# Patient Record
Sex: Female | Born: 1960
Health system: Southern US, Community
[De-identification: ages and names within clinical notes are randomized; demographics above are authoritative.]

## PROBLEM LIST (undated history)

## (undated) DIAGNOSIS — I4891 Unspecified atrial fibrillation: Secondary | ICD-10-CM

## (undated) DIAGNOSIS — I10 Essential (primary) hypertension: Secondary | ICD-10-CM

## (undated) DIAGNOSIS — I509 Heart failure, unspecified: Secondary | ICD-10-CM

## (undated) DIAGNOSIS — E119 Type 2 diabetes mellitus without complications: Secondary | ICD-10-CM

## (undated) DIAGNOSIS — K224 Dyskinesia of esophagus: Secondary | ICD-10-CM

## (undated) DIAGNOSIS — M1990 Unspecified osteoarthritis, unspecified site: Secondary | ICD-10-CM

## (undated) DIAGNOSIS — C4491 Basal cell carcinoma of skin, unspecified: Secondary | ICD-10-CM

## (undated) DIAGNOSIS — E785 Hyperlipidemia, unspecified: Secondary | ICD-10-CM

## (undated) DIAGNOSIS — K648 Other hemorrhoids: Secondary | ICD-10-CM

## (undated) DIAGNOSIS — F419 Anxiety disorder, unspecified: Secondary | ICD-10-CM

## (undated) DIAGNOSIS — F32A Depression, unspecified: Secondary | ICD-10-CM

## (undated) DIAGNOSIS — K635 Polyp of colon: Secondary | ICD-10-CM

## (undated) DIAGNOSIS — F329 Major depressive disorder, single episode, unspecified: Secondary | ICD-10-CM

## (undated) DIAGNOSIS — I499 Cardiac arrhythmia, unspecified: Secondary | ICD-10-CM

## (undated) DIAGNOSIS — C801 Malignant (primary) neoplasm, unspecified: Secondary | ICD-10-CM

## (undated) HISTORY — DX: Polyp of colon: K63.5

## (undated) HISTORY — DX: Heart failure, unspecified: I50.9

## (undated) HISTORY — PX: CHOLECYSTECTOMY: SHX55

## (undated) HISTORY — DX: Other hemorrhoids: K64.8

## (undated) HISTORY — DX: Dyskinesia of esophagus: K22.4

## (undated) HISTORY — DX: Essential (primary) hypertension: I10

## (undated) HISTORY — DX: Basal cell carcinoma of skin, unspecified: C44.91

## (undated) HISTORY — DX: Anxiety disorder, unspecified: F41.9

## (undated) HISTORY — PX: BASAL CELL CARCINOMA EXCISION: SHX1214

## (undated) HISTORY — DX: Unspecified atrial fibrillation: I48.91

## (undated) HISTORY — DX: Unspecified osteoarthritis, unspecified site: M19.90

---

## 1997-04-03 HISTORY — PX: TUBAL LIGATION: SHX77

## 2005-12-18 ENCOUNTER — Ambulatory Visit: Payer: Self-pay | Admitting: Unknown Physician Specialty

## 2007-05-27 ENCOUNTER — Ambulatory Visit: Payer: Self-pay | Admitting: Unknown Physician Specialty

## 2008-08-10 ENCOUNTER — Ambulatory Visit: Payer: Self-pay | Admitting: Unknown Physician Specialty

## 2008-11-13 ENCOUNTER — Ambulatory Visit: Payer: Self-pay | Admitting: Family Medicine

## 2009-08-12 ENCOUNTER — Ambulatory Visit: Payer: Self-pay | Admitting: Unknown Physician Specialty

## 2011-04-18 ENCOUNTER — Ambulatory Visit: Payer: Self-pay | Admitting: Unknown Physician Specialty

## 2012-02-12 ENCOUNTER — Ambulatory Visit: Payer: Self-pay | Admitting: Unknown Physician Specialty

## 2012-02-12 DIAGNOSIS — D124 Benign neoplasm of descending colon: Secondary | ICD-10-CM | POA: Insufficient documentation

## 2012-02-12 HISTORY — PX: COLONOSCOPY: SHX174

## 2012-10-18 ENCOUNTER — Other Ambulatory Visit: Payer: Self-pay | Admitting: Family Medicine

## 2012-10-18 LAB — CBC WITH DIFFERENTIAL/PLATELET
Basophil #: 0.1 10*3/uL (ref 0.0–0.1)
HCT: 40.1 % (ref 35.0–47.0)
Lymphocyte #: 3.7 10*3/uL — ABNORMAL HIGH (ref 1.0–3.6)
Lymphocyte %: 47.5 %
MCH: 30.5 pg (ref 26.0–34.0)
MCHC: 34.9 g/dL (ref 32.0–36.0)
Monocyte #: 0.7 x10 3/mm (ref 0.2–0.9)
Monocyte %: 9.4 %
Neutrophil #: 3.1 10*3/uL (ref 1.4–6.5)
RBC: 4.59 10*6/uL (ref 3.80–5.20)

## 2012-10-18 LAB — TSH: Thyroid Stimulating Horm: 1.79 u[IU]/mL

## 2012-10-18 LAB — COMPREHENSIVE METABOLIC PANEL
Albumin: 3.9 g/dL (ref 3.4–5.0)
Calcium, Total: 9.5 mg/dL (ref 8.5–10.1)
Co2: 27 mmol/L (ref 21–32)
Creatinine: 0.73 mg/dL (ref 0.60–1.30)
EGFR (African American): >60
Osmolality: 275 (ref 275–301)
SGOT(AST): 44 U/L — ABNORMAL HIGH (ref 15–37)
Total Protein: 8.7 g/dL — ABNORMAL HIGH (ref 6.4–8.2)

## 2012-10-18 LAB — LIPID PANEL
Cholesterol: 193 mg/dL (ref 0–200)
Triglycerides: 215 mg/dL — ABNORMAL HIGH (ref 0–200)
VLDL Cholesterol, Calc: 43 mg/dL — ABNORMAL HIGH (ref 5–40)

## 2013-04-01 ENCOUNTER — Ambulatory Visit: Payer: Self-pay | Admitting: Family Medicine

## 2013-04-03 ENCOUNTER — Ambulatory Visit: Payer: Self-pay | Admitting: Family Medicine

## 2013-05-04 ENCOUNTER — Ambulatory Visit: Payer: Self-pay | Admitting: Family Medicine

## 2014-02-17 DIAGNOSIS — K2960 Other gastritis without bleeding: Secondary | ICD-10-CM | POA: Insufficient documentation

## 2014-02-17 DIAGNOSIS — K5909 Other constipation: Secondary | ICD-10-CM | POA: Insufficient documentation

## 2014-03-13 DIAGNOSIS — A6000 Herpesviral infection of urogenital system, unspecified: Secondary | ICD-10-CM | POA: Insufficient documentation

## 2014-06-12 LAB — TSH: TSH: 0.92 (ref 0.41–5.90)

## 2014-06-12 LAB — MICROALBUMIN, URINE: MICROALB UR: 80

## 2014-12-03 LAB — BASIC METABOLIC PANEL
Creatinine: 0.7 (ref 0.5–1.1)
Glucose: 138

## 2014-12-03 LAB — LIPID PANEL
Cholesterol: 189 (ref 0–200)
HDL: 43 (ref 35–70)
LDL CALC: 114
Triglycerides: 162 — AB (ref 40–160)

## 2014-12-03 LAB — HEMOGLOBIN A1C: HEMOGLOBIN A1C: 6.2 — AB (ref 4.0–6.0)

## 2015-04-08 LAB — LIPID PANEL
CHOLESTEROL: 178 (ref 0–200)
HDL: 40 (ref 35–70)
LDL Cholesterol: 114
Triglycerides: 122 (ref 40–160)

## 2015-04-08 LAB — CBC AND DIFFERENTIAL
HEMATOCRIT: 42 (ref 36–46)
HEMOGLOBIN: 14.1 (ref 12.0–16.0)
Platelets: 223 (ref 150–399)
WBC: 7

## 2015-04-30 LAB — BASIC METABOLIC PANEL
BUN: 11 (ref 4–21)
Creatinine: 0.6 (ref 0.5–1.1)
Glucose: 121
POTASSIUM: 4.3 (ref 3.4–5.3)
SODIUM: 139 (ref 137–147)

## 2015-04-30 LAB — POCT INR: INR: 1.1 (ref 0.9–1.1)

## 2015-04-30 LAB — PROTIME-INR: PROTIME: 11 (ref 10.0–13.8)

## 2015-05-12 ENCOUNTER — Ambulatory Visit: Payer: 59 | Admitting: Registered Nurse

## 2015-05-12 ENCOUNTER — Ambulatory Visit
Admission: RE | Admit: 2015-05-12 | Discharge: 2015-05-12 | Disposition: A | Discharge status: Home / Self Care | Payer: 59 | Source: Ambulatory Visit | Attending: Internal Medicine | Admitting: Internal Medicine

## 2015-05-12 ENCOUNTER — Encounter
Admission: RE | Disposition: A | Discharge status: Home / Self Care | Payer: Self-pay | Source: Ambulatory Visit | Attending: Internal Medicine

## 2015-05-12 DIAGNOSIS — Z83511 Family history of glaucoma: Secondary | ICD-10-CM | POA: Insufficient documentation

## 2015-05-12 DIAGNOSIS — Z808 Family history of malignant neoplasm of other organs or systems: Secondary | ICD-10-CM | POA: Diagnosis not present

## 2015-05-12 DIAGNOSIS — Z8261 Family history of arthritis: Secondary | ICD-10-CM | POA: Diagnosis not present

## 2015-05-12 DIAGNOSIS — Z7984 Long term (current) use of oral hypoglycemic drugs: Secondary | ICD-10-CM | POA: Insufficient documentation

## 2015-05-12 DIAGNOSIS — Z833 Family history of diabetes mellitus: Secondary | ICD-10-CM | POA: Diagnosis not present

## 2015-05-12 DIAGNOSIS — I4892 Unspecified atrial flutter: Secondary | ICD-10-CM | POA: Diagnosis present

## 2015-05-12 DIAGNOSIS — Z8042 Family history of malignant neoplasm of prostate: Secondary | ICD-10-CM | POA: Insufficient documentation

## 2015-05-12 DIAGNOSIS — Z818 Family history of other mental and behavioral disorders: Secondary | ICD-10-CM | POA: Insufficient documentation

## 2015-05-12 DIAGNOSIS — I4581 Long QT syndrome: Secondary | ICD-10-CM | POA: Insufficient documentation

## 2015-05-12 DIAGNOSIS — Z85828 Personal history of other malignant neoplasm of skin: Secondary | ICD-10-CM | POA: Diagnosis not present

## 2015-05-12 DIAGNOSIS — Z9049 Acquired absence of other specified parts of digestive tract: Secondary | ICD-10-CM | POA: Diagnosis not present

## 2015-05-12 DIAGNOSIS — Z820 Family history of epilepsy and other diseases of the nervous system: Secondary | ICD-10-CM | POA: Insufficient documentation

## 2015-05-12 DIAGNOSIS — E119 Type 2 diabetes mellitus without complications: Secondary | ICD-10-CM | POA: Insufficient documentation

## 2015-05-12 DIAGNOSIS — I10 Essential (primary) hypertension: Secondary | ICD-10-CM | POA: Insufficient documentation

## 2015-05-12 DIAGNOSIS — E785 Hyperlipidemia, unspecified: Secondary | ICD-10-CM | POA: Insufficient documentation

## 2015-05-12 DIAGNOSIS — Z79899 Other long term (current) drug therapy: Secondary | ICD-10-CM | POA: Diagnosis not present

## 2015-05-12 DIAGNOSIS — Z8249 Family history of ischemic heart disease and other diseases of the circulatory system: Secondary | ICD-10-CM | POA: Insufficient documentation

## 2015-05-12 DIAGNOSIS — I495 Sick sinus syndrome: Secondary | ICD-10-CM | POA: Insufficient documentation

## 2015-05-12 DIAGNOSIS — F329 Major depressive disorder, single episode, unspecified: Secondary | ICD-10-CM | POA: Insufficient documentation

## 2015-05-12 HISTORY — DX: Major depressive disorder, single episode, unspecified: F32.9

## 2015-05-12 HISTORY — DX: Depression, unspecified: F32.A

## 2015-05-12 HISTORY — PX: ELECTROPHYSIOLOGIC STUDY: SHX172A

## 2015-05-12 HISTORY — DX: Type 2 diabetes mellitus without complications: E11.9

## 2015-05-12 HISTORY — DX: Cardiac arrhythmia, unspecified: I49.9

## 2015-05-12 SURGERY — CARDIOVERSION (CATH LAB)
Anesthesia: General

## 2015-05-12 MED ORDER — PROPOFOL 10 MG/ML IV BOLUS
INTRAVENOUS | Status: DC | PRN
  Administered 2015-05-12: 80 mg via INTRAVENOUS

## 2015-05-12 MED ORDER — SODIUM CHLORIDE 0.9 % IV SOLN
INTRAVENOUS | Status: DC | PRN
  Administered 2015-05-12: 08:00:00 via INTRAVENOUS

## 2015-05-12 NOTE — Transfer of Care (Signed)
Immediate Anesthesia Transfer of Care Note  Patient: Julia Stark  Procedure(s) Performed: Procedure(s): Cardioversion (N/A)  Patient Location: PACU  Anesthesia Type:General  Level of Consciousness: sedated  Airway & Oxygen Therapy: Patient Spontanous Breathing and Patient connected to face mask oxygen  Post-op Assessment: Report given to RN and Post -op Vital signs reviewed and stable  Post vital signs: Reviewed and stable  Last Vitals:  Filed Vitals:   05/12/15 0804 05/12/15 0805  BP:  104/86  Pulse: 74   Temp:    Resp: 14 20    Complications: No apparent anesthesia complications

## 2015-05-12 NOTE — Discharge Instructions (Signed)
Electrical Cardioversion, Care After °Refer to this sheet in the next few weeks. These instructions provide you with information on caring for yourself after your procedure. Your health care provider may also give you more specific instructions. Your treatment has been planned according to current medical practices, but problems sometimes occur. Call your health care provider if you have any problems or questions after your procedure. °WHAT TO EXPECT AFTER THE PROCEDURE °After your procedure, it is typical to have the following sensations: °· Some redness on the skin where the shocks were delivered. If this is tender, a sunburn lotion or hydrocortisone cream may help. °· Possible return of an abnormal heart rhythm within hours or days after the procedure. °HOME CARE INSTRUCTIONS °· Take medicines only as directed by your health care provider. Be sure you understand how and when to take your medicine. °· Learn how to feel your pulse and check it often. °· Limit your activity for 48 hours after the procedure or as directed by your health care provider. °· Avoid or minimize caffeine and other stimulants as directed by your health care provider. °SEEK MEDICAL CARE IF: °· You feel like your heart is beating too fast or your pulse is not regular. °· You have any questions about your medicines. °· You have bleeding that will not stop. °SEEK IMMEDIATE MEDICAL CARE IF: °· You are dizzy or feel faint. °· It is hard to breathe or you feel short of breath. °· There is a change in discomfort in your chest. °· Your speech is slurred or you have trouble moving an arm or leg on one side of your body. °· You get a serious muscle cramp that does not go away. °· Your fingers or toes turn cold or blue. °  °This information is not intended to replace advice given to you by your health care provider. Make sure you discuss any questions you have with your health care provider. °  °Document Released: 01/08/2013 Document Revised: 04/10/2014  Document Reviewed: 01/08/2013 °Elsevier Interactive Patient Education ©2016 [employer]vier Inc[employer] ° °

## 2015-05-12 NOTE — Anesthesia Preprocedure Evaluation (Signed)
Anesthesia Evaluation  Patient identified by MRN, date of birth, ID band Patient awake    Reviewed: Allergy & Precautions, H&P , NPO status , Patient's Chart, lab work & pertinent test results, reviewed documented beta blocker date and time   History of Anesthesia Complications Negative for: history of anesthetic complications  Airway Mallampati: I  TM Distance: >3 FB Neck ROM: full    Dental no notable dental hx. (+) Teeth Intact   Pulmonary neg pulmonary ROS,    Pulmonary exam normal breath sounds clear to auscultation       Cardiovascular Exercise Tolerance: Good (-) hypertension(-) angina(-) CAD, (-) Past MI, (-) Cardiac Stents and (-) CABG Normal cardiovascular exam+ dysrhythmias Atrial Fibrillation (-) Valvular Problems/Murmurs Rhythm:regular Rate:Normal     Neuro/Psych PSYCHIATRIC DISORDERS (Depression) negative neurological ROS     GI/Hepatic negative GI ROS, Neg liver ROS,   Endo/Other  negative endocrine ROSdiabetes  Renal/GU negative Renal ROS  negative genitourinary   Musculoskeletal   Abdominal   Peds  Hematology negative hematology ROS (+)   Anesthesia Other Findings Past Medical History:   Dysrhythmia                                                  Diabetes mellitus without complication (HCC)                 Depression                                                   Reproductive/Obstetrics negative OB ROS                             Anesthesia Physical Anesthesia Plan  ASA: III  Anesthesia Plan: General   Post-op Pain Management:    Induction:   Airway Management Planned:   Additional Equipment:   Intra-op Plan:   Post-operative Plan:   Informed Consent: I have reviewed the patients History and Physical, chart, labs and discussed the procedure including the risks, benefits and alternatives for the proposed anesthesia with the patient or authorized  representative who has indicated his/her understanding and acceptance.   Dental Advisory Given  Plan Discussed with: Anesthesiologist, CRNA and Surgeon  Anesthesia Plan Comments:         Anesthesia Quick Evaluation

## 2015-05-12 NOTE — CV Procedure (Signed)
Electrical Cardioversion Procedure Note JANIYHA HOLUB [id number] Jul 02, 1960  Procedure: Electrical Cardioversion Indications:  Atrial Flutter  Procedure Details Consent: Risks of procedure as well as the alternatives and risks of each were explained to the (patient/caregiver).  Consent for procedure obtained. Time Out: Verified patient identification, verified procedure, site/side was marked, verified correct patient position, special equipment/implants available, medications/allergies/relevent history reviewed, required imaging and test results available.  Performed  Patient placed on cardiac monitor, pulse oximetry, supplemental oxygen as necessary.  Sedation given: Benzodiazepines and Short-acting barbiturates Pacer pads placed anterior and posterior chest.  Cardioverted 1 time(s).  Cardioverted at 120J.  Evaluation Findings: Post procedure EKG shows: NSR Complications: None Patient did tolerate procedure well.   Corey Skains 05/12/2015, 8:01 AM

## 2015-05-12 NOTE — Anesthesia Postprocedure Evaluation (Signed)
Anesthesia Post Note  Patient: Julia Stark  Procedure(s) Performed: Procedure(s) (LRB): Cardioversion (N/A)  Patient location during evaluation: Cath Lab Anesthesia Type: General Level of consciousness: awake and alert Pain management: pain level controlled Vital Signs Assessment: post-procedure vital signs reviewed and stable Respiratory status: spontaneous breathing, nonlabored ventilation, respiratory function stable and patient connected to nasal cannula oxygen Cardiovascular status: blood pressure returned to baseline and stable Postop Assessment: no signs of nausea or vomiting Anesthetic complications: no    Last Vitals:  Filed Vitals:   05/12/15 0822 05/12/15 0837  BP: 105/68 110/61  Pulse: 72 69  Temp:    Resp: 15 14    Last Pain: There were no vitals filed for this visit.               Martha Clan

## 2015-05-25 DIAGNOSIS — I480 Paroxysmal atrial fibrillation: Secondary | ICD-10-CM | POA: Insufficient documentation

## 2015-10-12 ENCOUNTER — Encounter: Payer: Self-pay | Admitting: Family Medicine

## 2015-10-12 LAB — LIPID PANEL
Cholesterol: 207 — AB (ref 0–200)
HDL: 45 (ref 35–70)
LDL Cholesterol: 126
TRIGLYCERIDES: 179 — AB (ref 40–160)

## 2015-10-12 LAB — BASIC METABOLIC PANEL
Creatinine: 0.6 (ref 0.5–1.1)
Glucose: 145

## 2015-10-12 LAB — HEMOGLOBIN A1C: Hgb A1c MFr Bld: 6 (ref 4.0–6.0)

## 2016-01-18 ENCOUNTER — Other Ambulatory Visit: Payer: Self-pay | Admitting: Family Medicine

## 2016-01-18 DIAGNOSIS — Z1231 Encounter for screening mammogram for malignant neoplasm of breast: Secondary | ICD-10-CM

## 2016-01-18 LAB — HEMOGLOBIN A1C: Hemoglobin A1C: 6.4

## 2016-01-18 LAB — HM PAP SMEAR: HM PAP: NEGATIVE

## 2016-02-22 ENCOUNTER — Ambulatory Visit
Admission: RE | Admit: 2016-02-22 | Discharge: 2016-02-22 | Disposition: A | Discharge status: Home / Self Care | Payer: 59 | Source: Ambulatory Visit | Attending: Family Medicine | Admitting: Family Medicine

## 2016-02-22 DIAGNOSIS — Z1231 Encounter for screening mammogram for malignant neoplasm of breast: Secondary | ICD-10-CM | POA: Insufficient documentation

## 2016-03-01 ENCOUNTER — Other Ambulatory Visit: Payer: Self-pay | Admitting: *Deleted

## 2016-03-01 ENCOUNTER — Inpatient Hospital Stay
Admission: RE | Admit: 2016-03-01 | Discharge: 2016-03-01 | Disposition: A | Discharge status: Home / Self Care | Payer: Self-pay | Source: Ambulatory Visit | Attending: *Deleted | Admitting: *Deleted

## 2016-03-01 DIAGNOSIS — Z9289 Personal history of other medical treatment: Secondary | ICD-10-CM

## 2016-10-17 LAB — HM DIABETES EYE EXAM

## 2016-11-02 LAB — LIPID PANEL
Cholesterol: 224 — AB (ref 0–200)
HDL: 51 (ref 35–70)
LDL Cholesterol: 126
Triglycerides: 235 — AB (ref 40–160)

## 2016-11-02 LAB — HEMOGLOBIN A1C: Hgb A1c MFr Bld: 6.2 — AB (ref 4.0–6.0)

## 2016-11-02 LAB — BASIC METABOLIC PANEL
Creatinine: 0.6 (ref 0.5–1.1)
GLUCOSE: 144

## 2017-02-05 DIAGNOSIS — I441 Atrioventricular block, second degree: Secondary | ICD-10-CM | POA: Insufficient documentation

## 2017-02-13 ENCOUNTER — Ambulatory Visit
Admission: RE | Admit: 2017-02-13 | Discharge: 2017-02-13 | Disposition: A | Discharge status: Home / Self Care | Payer: Worker's Compensation | Source: Ambulatory Visit | Attending: Family | Admitting: Family

## 2017-02-13 ENCOUNTER — Other Ambulatory Visit: Payer: Self-pay | Admitting: Family

## 2017-02-13 DIAGNOSIS — R52 Pain, unspecified: Secondary | ICD-10-CM

## 2017-02-13 DIAGNOSIS — M25512 Pain in left shoulder: Secondary | ICD-10-CM | POA: Insufficient documentation

## 2017-04-06 ENCOUNTER — Encounter: Payer: Self-pay | Admitting: *Deleted

## 2017-04-09 ENCOUNTER — Ambulatory Visit: Payer: BLUE CROSS/BLUE SHIELD | Admitting: Anesthesiology

## 2017-04-09 ENCOUNTER — Ambulatory Visit
Admission: RE | Admit: 2017-04-09 | Discharge: 2017-04-09 | Disposition: A | Discharge status: Home / Self Care | Payer: BLUE CROSS/BLUE SHIELD | Source: Ambulatory Visit | Attending: Unknown Physician Specialty | Admitting: Unknown Physician Specialty

## 2017-04-09 ENCOUNTER — Encounter: Payer: Self-pay | Admitting: *Deleted

## 2017-04-09 ENCOUNTER — Encounter
Admission: RE | Disposition: A | Discharge status: Home / Self Care | Payer: Self-pay | Source: Ambulatory Visit | Attending: Unknown Physician Specialty

## 2017-04-09 DIAGNOSIS — Z85828 Personal history of other malignant neoplasm of skin: Secondary | ICD-10-CM | POA: Diagnosis not present

## 2017-04-09 DIAGNOSIS — E119 Type 2 diabetes mellitus without complications: Secondary | ICD-10-CM | POA: Diagnosis not present

## 2017-04-09 DIAGNOSIS — Z7984 Long term (current) use of oral hypoglycemic drugs: Secondary | ICD-10-CM | POA: Diagnosis not present

## 2017-04-09 DIAGNOSIS — Z1211 Encounter for screening for malignant neoplasm of colon: Secondary | ICD-10-CM | POA: Diagnosis present

## 2017-04-09 DIAGNOSIS — Z86010 Personal history of colonic polyps: Secondary | ICD-10-CM | POA: Diagnosis not present

## 2017-04-09 DIAGNOSIS — K648 Other hemorrhoids: Secondary | ICD-10-CM | POA: Diagnosis not present

## 2017-04-09 DIAGNOSIS — F329 Major depressive disorder, single episode, unspecified: Secondary | ICD-10-CM | POA: Insufficient documentation

## 2017-04-09 DIAGNOSIS — Z7901 Long term (current) use of anticoagulants: Secondary | ICD-10-CM | POA: Insufficient documentation

## 2017-04-09 DIAGNOSIS — Z79899 Other long term (current) drug therapy: Secondary | ICD-10-CM | POA: Diagnosis not present

## 2017-04-09 DIAGNOSIS — I4891 Unspecified atrial fibrillation: Secondary | ICD-10-CM | POA: Insufficient documentation

## 2017-04-09 DIAGNOSIS — K640 First degree hemorrhoids: Secondary | ICD-10-CM | POA: Diagnosis not present

## 2017-04-09 DIAGNOSIS — E785 Hyperlipidemia, unspecified: Secondary | ICD-10-CM | POA: Diagnosis not present

## 2017-04-09 HISTORY — DX: Malignant (primary) neoplasm, unspecified: C80.1

## 2017-04-09 HISTORY — PX: COLONOSCOPY WITH PROPOFOL: SHX5780

## 2017-04-09 HISTORY — DX: Hyperlipidemia, unspecified: E78.5

## 2017-04-09 LAB — GLUCOSE, CAPILLARY: Glucose-Capillary: 123 mg/dL — ABNORMAL HIGH (ref 65–99)

## 2017-04-09 SURGERY — COLONOSCOPY WITH PROPOFOL
Anesthesia: General

## 2017-04-09 MED ORDER — FENTANYL CITRATE (PF) 100 MCG/2ML IJ SOLN
INTRAMUSCULAR | Status: DC | PRN
  Administered 2017-04-09 (×2): 50 ug via INTRAVENOUS

## 2017-04-09 MED ORDER — SODIUM CHLORIDE 0.9 % IV SOLN
INTRAVENOUS | Status: DC
  Administered 2017-04-09: 08:00:00 via INTRAVENOUS

## 2017-04-09 MED ORDER — PROPOFOL 10 MG/ML IV BOLUS
INTRAVENOUS | Status: DC | PRN
  Administered 2017-04-09: 40 mg via INTRAVENOUS

## 2017-04-09 MED ORDER — MIDAZOLAM HCL 2 MG/2ML IJ SOLN
INTRAMUSCULAR | Status: AC
  Filled 2017-04-09: qty 2

## 2017-04-09 MED ORDER — LIDOCAINE HCL (PF) 2 % IJ SOLN
INTRAMUSCULAR | Status: DC | PRN
  Administered 2017-04-09: 80 mg

## 2017-04-09 MED ORDER — SODIUM CHLORIDE 0.9 % IV SOLN: INTRAVENOUS | Status: DC

## 2017-04-09 MED ORDER — MIDAZOLAM HCL 5 MG/5ML IJ SOLN
INTRAMUSCULAR | Status: DC | PRN
  Administered 2017-04-09 (×2): 2 mg via INTRAVENOUS

## 2017-04-09 MED ORDER — PROPOFOL 500 MG/50ML IV EMUL
INTRAVENOUS | Status: AC
  Filled 2017-04-09: qty 50

## 2017-04-09 MED ORDER — FENTANYL CITRATE (PF) 100 MCG/2ML IJ SOLN
INTRAMUSCULAR | Status: AC
  Filled 2017-04-09: qty 2

## 2017-04-09 MED ORDER — ONDANSETRON HCL 4 MG/2ML IJ SOLN
INTRAMUSCULAR | Status: DC | PRN
  Administered 2017-04-09: 4 mg via INTRAVENOUS

## 2017-04-09 MED ORDER — PROPOFOL 500 MG/50ML IV EMUL
INTRAVENOUS | Status: DC | PRN
  Administered 2017-04-09: 50 ug/kg/min via INTRAVENOUS

## 2017-04-09 NOTE — Op Note (Addendum)
[facility] Gastroenterology Patient Name: Nil Bolser Procedure Date: 04/09/2017 8:22 AM MRN: [id number] Account #: [id number] Date of Birth: 08-21-1960 Admit Type: Outpatient Age: 57 Room: [facility] ENDO ROOM 3 Gender: Female Note Status: Finalized Procedure:            Colonoscopy Indications:          High risk colon cancer surveillance: Personal history                        of colonic polyps Providers:            Manya Silvas, MD Referring MD:         Ardyth Man. Santayana (Referring MD) Medicines:            Propofol per Anesthesia Complications:        No immediate complications. Procedure:            Pre-Anesthesia Assessment:                       - After reviewing the risks and benefits, the patient                        was deemed in satisfactory condition to undergo the                        procedure.                       After obtaining informed consent, the colonoscope was                        passed under direct vision. Throughout the procedure,                        the patient's blood pressure, pulse, and oxygen                        saturations were monitored continuously. The                        Colonoscope was introduced through the anus and                        advanced to the the cecum, identified by appendiceal                        orifice and ileocecal valve. The colonoscopy was                        performed without difficulty. The patient tolerated the                        procedure well. The quality of the bowel preparation                        was excellent. Findings:      Internal hemorrhoids were found during endoscopy. The hemorrhoids were       small and Grade I (internal hemorrhoids that do not prolapse).      The exam was otherwise without abnormality. Impression:           - Internal  hemorrhoids.                       - The examination was otherwise normal.                       - No specimens  collected. Recommendation:       - Repeat colonoscopy in 5 years for adenoma                        surveillance. Manya Silvas, MD 04/09/2017 9:00:34 AM This report has been signed electronically. Number of Addenda: 0 Note Initiated On: 04/09/2017 8:22 AM Scope Withdrawal Time: 0 hours 9 minutes 43 seconds  Total Procedure Duration: 0 hours 22 minutes 46 seconds       [facility]

## 2017-04-09 NOTE — H&P (Signed)
Primary Care Physician:  Gearldine Shown, DO Primary Gastroenterologist:  Dr. Vira Agar  Pre-Procedure History & Physical: HPI:  Julia Stark is a 57 y.o. female is here for an colonoscopy.   Past Medical History:  Diagnosis Date  . Cancer ([place])    BASAL CELL CARCINOMA OF FACE  . Depression   . Diabetes mellitus without complication ([place])   . Dysrhythmia    A fib  . Hyperlipidemia     Past Surgical History:  Procedure Laterality Date  . CESAREAN SECTION  1999  . CHOLECYSTECTOMY    . COLONOSCOPY  02/12/2012  . ELECTROPHYSIOLOGIC STUDY N/A 05/12/2015   Procedure: Cardioversion;  Surgeon: Corey Skains, MD;  Location: [facility] ORS;  Service: Cardiovascular;  Laterality: N/A;  . [place]    Prior to Admission medications   Medication Sig Start Date End Date Taking? Authorizing Provider  apixaban (ELIQUIS) 5 MG TABS tablet Take 5 mg by mouth 2 (two) times daily.   Yes [provider]  Biotin 10 MG TABS Take 10 mg by mouth daily.   Yes [provider]  diltiazem (CARDIZEM CD) 120 MG 24 hr capsule Take 120 mg by mouth daily.   Yes [provider]  flecainide (TAMBOCOR) 100 MG tablet Take 100 mg by mouth 2 (two) times daily.   Yes [provider]  magnesium oxide (MAG-OX) 400 MG tablet Take 400 mg by mouth daily.   Yes [provider]  metFORMIN (GLUCOPHAGE) 1000 MG tablet Take 1,000 mg by mouth daily.   Yes [provider]  Multiple Vitamin (MULTIVITAMIN) tablet Take 1 tablet by mouth daily.   Yes [provider]  potassium chloride (K-DUR,KLOR-CON) 10 MEQ tablet Take 10 mEq by mouth daily.   Yes [provider]  pravastatin (PRAVACHOL) 40 MG tablet Take 40 mg by mouth daily.   Yes [provider]  ramipril (ALTACE) 1.25 MG capsule Take 1.25 mg by mouth daily.   Yes [provider]  citalopram (CELEXA) 40 MG tablet Take 40 mg by mouth daily.    [provider]     Allergies as of 02/19/2017  . (Not on File)    History reviewed. No pertinent family history.  Social History   Socioeconomic History  . Marital status: Divorced    Spouse name: Not on file  . Number of children: Not on file  . Years of education: Not on file  . Highest education level: Not on file  Social Needs  . Financial resource strain: Not on file  . Food insecurity - worry: Not on file  . Food insecurity - inability: Not on file  . Transportation needs - medical: Not on file  . Transportation needs - non-medical: Not on file  Occupational History  . Not on file  Tobacco Use  . Smoking status: Never Smoker  . Smokeless tobacco: Never Used  Substance and Sexual Activity  . Alcohol use: Yes    Alcohol/week: 3.6 oz    Types: 3 Glasses of wine, 3 Cans of beer per week  . Drug use: No  . Sexual activity: Yes    Birth control/protection: Post-menopausal  Other Topics Concern  . Not on file  Social History Narrative  . Not on file    Review of Systems: See HPI, otherwise negative ROS  Physical Exam: BP (!) 146/75   Pulse 79   Temp (!) 97.1 F (36.2 C) (Tympanic)   Resp 16   Ht 5'  8" (1.727 m)   Wt 89.8 kg (198 lb)   LMP 04/13/2011   SpO2 100%   BMI 30.11 kg/m  General:   Alert,  pleasant and cooperative in NAD Head:  Normocephalic and atraumatic. Neck:  Supple; no masses or thyromegaly. Lungs:  Clear throughout to auscultation.    Heart:  Regular rate and rhythm. Abdomen:  Soft, nontender and nondistended. Normal bowel sounds, without guarding, and without rebound.   Neurologic:  Alert and  oriented x4;  grossly normal neurologically.  Impression/Plan: [name] is here for an colonoscopy to be performed for [facility] colon polyps.  Risks, benefits, limitations, and alternatives regarding  colonoscopy have been reviewed with the patient.  Questions have been answered.  All parties agreeable.   Gaylyn Cheers, MD  04/09/2017, 8:27 AM

## 2017-04-09 NOTE — Transfer of Care (Signed)
Immediate Anesthesia Transfer of Care Note  Patient: Julia Stark  Procedure(s) Performed: COLONOSCOPY WITH PROPOFOL (N/A )  Patient Location: PACU  Anesthesia Type:General  Level of Consciousness: sedated  Airway & Oxygen Therapy: Patient Spontanous Breathing and Patient connected to nasal cannula oxygen  Post-op Assessment: Report given to RN and Post -op Vital signs reviewed and stable  Post vital signs: Reviewed and stable  Last Vitals:  Vitals:   04/09/17 0736  BP: (!) 146/75  Pulse: 79  Resp: 16  Temp: (!) 36.2 C  SpO2: 100%    Last Pain:  Vitals:   04/09/17 0736  TempSrc: Tympanic         Complications: No apparent anesthesia complications

## 2017-04-09 NOTE — Anesthesia Post-op Follow-up Note (Signed)
Anesthesia QCDR form completed.        

## 2017-04-09 NOTE — Anesthesia Preprocedure Evaluation (Signed)
Anesthesia Evaluation  Patient identified by MRN, date of birth, ID band Patient awake    Reviewed: Allergy & Precautions, H&P , NPO status , Patient's Chart, lab work & pertinent test results, reviewed documented beta blocker date and time   History of Anesthesia Complications Negative for: history of anesthetic complications  Airway Mallampati: I  TM Distance: >3 FB Neck ROM: full    Dental  (+) Teeth Intact, Dental Advidsory Given   Pulmonary neg pulmonary ROS,           Cardiovascular Exercise Tolerance: Good (-) hypertension(-) angina(-) CAD, (-) Past MI, (-) Cardiac Stents and (-) CABG + dysrhythmias Atrial Fibrillation (-) Valvular Problems/Murmurs     Neuro/Psych PSYCHIATRIC DISORDERS (Depression) negative neurological ROS     GI/Hepatic negative GI ROS, Neg liver ROS,   Endo/Other  negative endocrine ROSdiabetes  Renal/GU negative Renal ROS  negative genitourinary   Musculoskeletal   Abdominal   Peds  Hematology negative hematology ROS (+)   Anesthesia Other Findings Past Medical History:   Dysrhythmia                                                  Diabetes mellitus without complication (HCC)                 Depression                                                   Reproductive/Obstetrics negative OB ROS                             Anesthesia Physical  Anesthesia Plan  ASA: III  Anesthesia Plan: General   Post-op Pain Management:    Induction: Intravenous  PONV Risk Score and Plan: 3 and Propofol infusion  Airway Management Planned: Nasal Cannula  Additional Equipment:   Intra-op Plan:   Post-operative Plan:   Informed Consent: I have reviewed the patients History and Physical, chart, labs and discussed the procedure including the risks, benefits and alternatives for the proposed anesthesia with the patient or authorized representative who has indicated  his/her understanding and acceptance.   Dental Advisory Given  Plan Discussed with: Anesthesiologist, CRNA and Surgeon  Anesthesia Plan Comments:         Anesthesia Quick Evaluation

## 2017-04-09 NOTE — Anesthesia Postprocedure Evaluation (Signed)
Anesthesia Post Note  Patient: Julia Stark  Procedure(s) Performed: COLONOSCOPY WITH PROPOFOL (N/A )  Patient location during evaluation: Endoscopy Anesthesia Type: General Level of consciousness: awake and alert Pain management: pain level controlled Vital Signs Assessment: post-procedure vital signs reviewed and stable Respiratory status: spontaneous breathing, nonlabored ventilation, respiratory function stable and patient connected to nasal cannula oxygen Cardiovascular status: blood pressure returned to baseline and stable Postop Assessment: no apparent nausea or vomiting Anesthetic complications: no     Last Vitals:  Vitals:   04/09/17 0922 04/09/17 0932  BP: 112/67 122/67  Pulse: 79 73  Resp: 13 12  Temp:    SpO2: 100% 100%    Last Pain:  Vitals:   04/09/17 0902  TempSrc: Tympanic                 Martha Clan

## 2017-04-10 ENCOUNTER — Encounter: Payer: Self-pay | Admitting: Unknown Physician Specialty

## 2017-07-06 ENCOUNTER — Telehealth: Payer: Self-pay | Admitting: Family Medicine

## 2017-07-06 NOTE — Telephone Encounter (Signed)
ROI faxed on 4.3.19 to [place]

## 2017-08-06 DIAGNOSIS — I480 Paroxysmal atrial fibrillation: Secondary | ICD-10-CM | POA: Diagnosis not present

## 2017-08-06 DIAGNOSIS — E782 Mixed hyperlipidemia: Secondary | ICD-10-CM | POA: Diagnosis not present

## 2017-08-06 DIAGNOSIS — I10 Essential (primary) hypertension: Secondary | ICD-10-CM | POA: Diagnosis not present

## 2017-08-22 ENCOUNTER — Encounter: Payer: Self-pay | Admitting: Family Medicine

## 2017-08-22 ENCOUNTER — Ambulatory Visit: Payer: BLUE CROSS/BLUE SHIELD | Admitting: Family Medicine

## 2017-08-22 VITALS — BP 112/64 | HR 92 | Temp 97.8°F | Resp 16 | Ht 66.75 in | Wt 202.0 lb

## 2017-08-22 DIAGNOSIS — I10 Essential (primary) hypertension: Secondary | ICD-10-CM | POA: Diagnosis not present

## 2017-08-22 DIAGNOSIS — E119 Type 2 diabetes mellitus without complications: Secondary | ICD-10-CM | POA: Diagnosis not present

## 2017-08-22 DIAGNOSIS — A6000 Herpesviral infection of urogenital system, unspecified: Secondary | ICD-10-CM

## 2017-08-22 DIAGNOSIS — K119 Disease of salivary gland, unspecified: Secondary | ICD-10-CM | POA: Diagnosis not present

## 2017-08-22 DIAGNOSIS — F329 Major depressive disorder, single episode, unspecified: Secondary | ICD-10-CM | POA: Insufficient documentation

## 2017-08-22 DIAGNOSIS — I480 Paroxysmal atrial fibrillation: Secondary | ICD-10-CM

## 2017-08-22 DIAGNOSIS — F331 Major depressive disorder, recurrent, moderate: Secondary | ICD-10-CM

## 2017-08-22 DIAGNOSIS — F411 Generalized anxiety disorder: Secondary | ICD-10-CM | POA: Insufficient documentation

## 2017-08-22 DIAGNOSIS — E785 Hyperlipidemia, unspecified: Secondary | ICD-10-CM

## 2017-08-22 DIAGNOSIS — J309 Allergic rhinitis, unspecified: Secondary | ICD-10-CM | POA: Insufficient documentation

## 2017-08-22 DIAGNOSIS — E1169 Type 2 diabetes mellitus with other specified complication: Secondary | ICD-10-CM | POA: Insufficient documentation

## 2017-08-22 DIAGNOSIS — I152 Hypertension secondary to endocrine disorders: Secondary | ICD-10-CM | POA: Insufficient documentation

## 2017-08-22 DIAGNOSIS — E1159 Type 2 diabetes mellitus with other circulatory complications: Secondary | ICD-10-CM | POA: Insufficient documentation

## 2017-08-22 NOTE — Patient Instructions (Signed)
Salivary Stone A salivary stone is a mineral deposit that builds up in the ducts that drain your salivary glands. Most salivary gland stones are made of calcium. When a stone forms, saliva can back up into the gland and cause painful swelling. Your salivary glands are the glands that produce spit (saliva). You have six major salivary glands. Each gland has a duct that carries saliva into your mouth. Saliva keeps your mouth moist and breaks down the food that you eat. It also helps to prevent tooth decay. Two salivary glands are located just in front of your ears (parotid). The ducts for these glands open up inside your cheeks, near your back teeth. You also have two glands under your tongue (sublingual) and two glands under your jaw (submandibular). The ducts for these glands open under your tongue. A stone can form in any salivary gland. The most common place for a salivary stone to develop is in a submandibular salivary gland. What are the causes? Any condition that reduces the flow of saliva may lead to stone formation. It is not known why some people form stones and others do not. What increases the risk? You may be more likely to develop a salivary stone if you:  Are female.  Do not drink enough water.  Smoke.  Have high blood pressure.  Have gout.  Have diabetes.  What are the signs or symptoms? The main sign of a salivary gland stone is sudden swelling of a salivary gland when eating. This usually happens under the jaw on one side. Other signs and symptoms include:  Swelling of the cheek or under the tongue when eating.  Pain in the swollen area.  Trouble chewing or swallowing.  Swelling that goes down after eating.  How is this diagnosed? Your health care provider may diagnose a salivary gland stone based on your signs and symptoms. The health care provider will also do a physical exam. In many cases, a stone can be felt in a duct inside your mouth. You may need to see an ear,  nose, and throat specialist (ENT or otolaryngologist) for diagnosis and treatment. You may also need to have diagnostic tests. These may include imaging studies to check for a stone, such as:  X-rays.  Ultrasound.  CT scan.  MRI.  How is this treated? Home care may be enough to treat a small stone that is not causing symptoms. Treatment of a stone that is large enough to cause symptoms may include:  Probing and widening the duct to allow the stone to pass.  Inserting a thin, flexible scope (endoscope) into the duct to locate and remove the stone.  Breaking up the stone with sound waves.  Removing the entire salivary gland.  Follow these instructions at home:  Drink enough fluid to keep your urine clear or pale yellow.  Follow these instructions every few hours: ? Suck on a lemon candy to stimulate the flow of saliva. ? Put a hot compress over the gland. ? Gently massage the gland.  Do not use any tobacco products, including cigarettes, chewing tobacco, or electronic cigarettes. If you need help quitting, ask your health care provider. Contact a health care provider if:  You have pain and swelling in your face, jaw, or mouth after eating.  You have persistent swelling in any of these places: ? In front of your ear. ? Under your jaw. ? Inside your mouth. Get help right away if:  You have pain and swelling in your face,   jaw, or mouth that are getting worse.  Your pain and swelling make it hard to swallow or breathe. This information is not intended to replace advice given to you by your health care provider. Make sure you discuss any questions you have with your health care provider. Document Released: 04/27/2004 Document Revised: 08/26/2015 Document Reviewed: 08/20/2013 Elsevier Interactive Patient Education  2018 [employer]vier Inc.  

## 2017-08-22 NOTE — Assessment & Plan Note (Signed)
Continue Valtrex as needed for outbreaks. 

## 2017-08-22 NOTE — Progress Notes (Signed)
Patient: Julia Stark, Female    DOB: 1960-06-25, 57 y.o.   MRN: [id number] Visit Date: 08/22/2017  Today's Provider: Lavon Paganini, MD   I, Martha Clan, CMA, am acting as scribe for Lavon Paganini, MD.  Chief Complaint  Patient presents with  . Establish Care   Subjective:    Reestablish Care [name] is a 57 y.o. female who presents today to reestablish care. She was previously Dr. Alben Spittle patient, and then she changes PCP's to [facility]. She feels fairly well. [name] c/o fatigue, arthralgias and myalgias. She reports exercising daily. Walks dog. She reports she is sleeping fairly well.  Last mammogram- 03/03/2016- BI-RADS 1 Last pap- 01/18/2016- NIL; HPV negative Last colonoscopy- 04/09/2017- Dr. Vira Agar. Internal hemorrhoids. Otherwise WNL. Repeat 5 years for adenoma surveillance.  H/o BCC of the face: Mother has h/o melanoma.  She states she is followed by dermatology annually for skin exams.  She wears sunscreen regularly.  HTN: - Medications: ramipril 10mg  daily - Compliance: good - Checking BP at home: no - Denies any SOB, CP, vision changes, LE edema, medication SEs, or symptoms of hypotension  T2DM - Checking BG at home: no - Medications: metformin 1000mg  daily - Compliance: good - eye exam: Reports that she is up-to-date, will request records from ophthalmology - foot exam: Needs - microalbumin: N/A, on ramipril - denies symptoms of hypoglycemia, polyuria, polydipsia, numbness extremities, foot ulcers/trauma  HLD - states Dr. Nehemiah Massed stopped statin (Crestor) last week due to myalgias - she is following up with Cardiology in 6 months  Depression/Anxiety: Was previously taking celexa and stopped due to exacerbation of Afib. Has been taking Effexor 37.5mg  daily for last few months.  Feels fairly well controlled.  Also has Xanax 0.25mg  to take daily prn (only takes 0.125 mg once every 2 months about).   Afib: Previously  cardioverted and doing well.  Last year went back into Afib.  Flecanide was added to current diltiazem.  Also taking Eliquis.  [name] followed by cardiology.  Jaw pain: This has been present intermittently for the last 2 to 3 weeks.  It will occur at rest suddenly and resolve over a few minutes.  It does not radiate from anywhere.  She feels a fullness of her jawline and salivary glands when this is happening.  [name] able to suck on lemon drop candy and this makes it better.  Her son has had lots of salivary stones and infections in the past and she remembers the symptoms from then.  She will occasionally feel this on the right side, but it is mostly occurring on the left side.  She states this is similar to when she had mumps as a child.  Patient also has history of genital herpes with intermittent outbreaks.  She takes Valtrex for these outbreaks when they occur. -----------------------------------------------------------------   Review of Systems  Constitutional: Positive for fatigue. Negative for activity change, appetite change, chills, diaphoresis, fever and unexpected weight change.  HENT: Positive for ear pain and sneezing. Negative for congestion, dental problem, drooling, ear discharge, facial swelling, hearing loss, mouth sores, nosebleeds, postnasal drip, rhinorrhea, sinus pressure, sinus pain, sore throat, tinnitus, trouble swallowing and voice change.   Eyes: Positive for pain. Negative for photophobia, discharge, redness, itching and visual disturbance.  Respiratory: Positive for cough. Negative for apnea, choking, chest tightness, shortness of breath, wheezing and stridor.   Cardiovascular: Positive for palpitations. Negative for chest pain and leg swelling.  Gastrointestinal: Positive for abdominal distention, abdominal pain and constipation. Negative for anal bleeding, blood in stool, diarrhea, nausea, rectal pain and vomiting.  Endocrine: Negative.   Genitourinary: Positive for  enuresis. Negative for decreased urine volume, difficulty urinating, dyspareunia, dysuria, flank pain, frequency, genital sores, hematuria, menstrual problem, pelvic pain, urgency, vaginal bleeding, vaginal discharge and vaginal pain.  Musculoskeletal: Positive for arthralgias and myalgias. Negative for back pain, gait problem, joint swelling, neck pain and neck stiffness.  Skin: Negative.   Allergic/Immunologic: Negative.   Neurological: Negative.   Hematological: Negative.   Psychiatric/Behavioral: Positive for sleep disturbance. Negative for agitation, behavioral problems, confusion, decreased concentration, dysphoric mood, hallucinations, self-injury and suicidal ideas. The patient is not nervous/anxious and is not hyperactive.     Social History      She  reports that she has never smoked. She has never used smokeless tobacco. She reports that she drinks about 2.4 oz of alcohol per week. She reports that she does not use drugs.       Social History   Socioeconomic History  . Marital status: Divorced    Spouse name: Not on file  . Number of children: 3  . Years of education: 63  . Highest education level: High school graduate  Occupational History    Employer: [employer]  Social Needs  . Financial resource strain: Not on file  . Food insecurity:    Worry: Not on file    Inability: Not on file  . Transportation needs:    Medical: Not on file    Non-medical: Not on file  Tobacco Use  . Smoking status: Never Smoker  . Smokeless tobacco: Never Used  Substance and Sexual Activity  . Alcohol use: Yes    Alcohol/week: 2.4 oz    Types: 2 Glasses of wine, 2 Cans of beer per week  . Drug use: No  . Sexual activity: Yes    Birth control/protection: Post-menopausal  Lifestyle  . Physical activity:    Days per week: Not on file    Minutes per session: Not on file  . Stress: Not on file  Relationships  . Social connections:    Talks on phone: Not on file    Gets together: Not on  file    Attends religious service: Not on file    Active member of club or organization: Not on file    Attends meetings of clubs or organizations: Not on file    Relationship status: Not on file  Other Topics Concern  . Not on file  Social History Narrative  . Not on file    Past Medical History:  Diagnosis Date  . Anxiety   . Arthritis   . Atrial fibrillation ([place])   . Cancer ([place])    BASAL CELL CARCINOMA OF FACE  . Depression   . Diabetes mellitus without complication ([place])   . Dysrhythmia    A fib  . Hyperlipidemia   . Hypertension      Patient Active Problem List   Diagnosis Date Noted  . Affective disorder, major 08/22/2017  . Allergic rhinitis 08/22/2017  . Anxiety, generalized 08/22/2017  . Diabetes mellitus type 2, uncomplicated ([place]) 00/17/4944  . Essential hypertension 08/22/2017  . Hyperlipidemia 08/22/2017  . Mobitz type 2 second degree atrioventricular block 02/05/2017  . Paroxysmal A-fib ([place]) 05/25/2015  . Recurrent genital HSV (herpes simplex virus) infection 03/13/2014  . Chronic constipation 02/17/2014  . Reflux gastritis 02/17/2014  . Adenomatous polyp of descending colon 02/12/2012  Past Surgical History:  Procedure Laterality Date  . CESAREAN SECTION  1999  . CHOLECYSTECTOMY    . COLONOSCOPY  02/12/2012  . COLONOSCOPY WITH PROPOFOL N/A 04/09/2017   Procedure: COLONOSCOPY WITH PROPOFOL;  Surgeon: Manya Silvas, MD;  Location: [facility] ENDOSCOPY;  Service: Endoscopy;  Laterality: N/A;  . ELECTROPHYSIOLOGIC STUDY N/A 05/12/2015   Procedure: Cardioversion;  Surgeon: Corey Skains, MD;  Location: [facility] ORS;  Service: Cardiovascular;  Laterality: N/A;  . [place]    Family History        Family Status  Relation Name Status  . Mother  (Not Specified)  . Father  (Not Specified)  . Mat Uncle  (Not Specified)  . MGF  (Not Specified)  . PGM  (Not Specified)  . PGF  (Not Specified)  . Cousin  (Not Specified)  . Sister  Alive  .  Brother  Alive        Her family history includes Alzheimer's disease in her father; Colon cancer in her maternal uncle; Depression in her mother and sister; Diabetes in her brother, mother, paternal grandmother, and sister; Heart disease in her paternal grandmother; Hyperlipidemia in her maternal grandfather; Hypertension in her maternal grandfather and mother; Ovarian cancer in her cousin; Prostate cancer in her paternal grandfather; Skin cancer in her mother.      Allergies  Allergen Reactions  . Rosuvastatin     muscle cramps     Current Outpatient Medications:  .  ALPRAZolam (XANAX) 0.25 MG tablet, Take by mouth., Disp: , Rfl:  .  apixaban (ELIQUIS) 5 MG TABS tablet, Take 5 mg by mouth 2 (two) times daily., Disp: , Rfl:  .  Biotin 5 MG TABS, Take 5 mg by mouth daily. , Disp: , Rfl:  .  diltiazem (CARDIZEM) 90 MG tablet, Take 1 tablet by mouth. Take 2-3 times per day PRN, Disp: , Rfl:  .  flecainide (TAMBOCOR) 100 MG tablet, Take 100 mg by mouth 2 (two) times daily., Disp: , Rfl:  .  magnesium oxide (MAG-OX) 400 MG tablet, Take 400 mg by mouth daily., Disp: , Rfl:  .  metFORMIN (GLUCOPHAGE) 1000 MG tablet, Take 1,000 mg by mouth daily., Disp: , Rfl:  .  Multiple Vitamin (MULTIVITAMIN) tablet, Take 1 tablet by mouth daily., Disp: , Rfl:  .  ramipril (ALTACE) 10 MG capsule, Take 1.25 mg by mouth daily. , Disp: , Rfl:  .  venlafaxine XR (EFFEXOR-XR) 37.5 MG 24 hr capsule, , Disp: , Rfl:  .  vitamin E 400 UNIT capsule, Take 400 Units by mouth. , Disp: , Rfl:    Patient Care Team: Virginia Crews, MD as PCP - General (Family Medicine)      Objective:   Vitals: BP 112/64 (BP Location: Left Arm, Patient Position: Sitting, Cuff Size: Normal)   Pulse 92   Temp 97.8 F (36.6 C) (Oral)   Resp 16   Ht 5' 6.75" (1.695 m)   Wt 202 lb (91.6 kg)   LMP 04/13/2011   SpO2 96%   BMI 31.88 kg/m    Vitals:   08/22/17 1511  BP: 112/64  Pulse: 92  Resp: 16  Temp: 97.8 F (36.6 C)    TempSrc: Oral  SpO2: 96%  Weight: 202 lb (91.6 kg)  Height: 5' 6.75" (1.695 m)     Physical Exam  Constitutional: [name] oriented to person, place, and time. She appears well-developed and well-nourished. No distress.  HENT:  Head: Normocephalic and atraumatic.  Right Ear: External ear normal.  Left Ear: External ear normal.  Nose: Nose normal.  Mouth/Throat: Oropharynx is clear and moist.  Some fullness and TTP of submandibular salivary gland on L side  Eyes: Pupils are equal, round, and reactive to light. Conjunctivae and EOM are normal. No scleral icterus.  Neck: Neck supple. No thyromegaly present.  Cardiovascular: Normal rate, regular rhythm, normal heart sounds and intact distal pulses.  No murmur heard. Pulmonary/Chest: Effort normal and breath sounds normal. No respiratory distress. She has no wheezes. She has no rales.  Abdominal: Soft. Bowel sounds are normal. She exhibits no distension. There is no tenderness. There is no rebound and no guarding.  Musculoskeletal: She exhibits no edema or deformity.  Lymphadenopathy:    She has no cervical adenopathy.  Neurological: [name] alert and oriented to person, place, and time.  Skin: Skin is warm and dry. Capillary refill takes less than 2 seconds. No rash noted.  Psychiatric: She has a normal mood and affect. Her behavior is normal.  Vitals reviewed.    Depression Screen PHQ 2/9 Scores 08/22/2017  PHQ - 2 Score 0  PHQ- 9 Score 3     Assessment & Plan:   Problem List Items Addressed This Visit      Cardiovascular and Mediastinum   Essential hypertension - Primary    Well-controlled Continue current medications Check BMP      Relevant Medications   diltiazem (CARDIZEM) 90 MG tablet   Other Relevant Orders   Basic Metabolic Panel (BMET)   Paroxysmal A-fib ([place])    Followed by cardiology Doing well on current medications Rate controlled and rhythm controlled today Continue Eliquis, flecainide and diltiazem       Relevant Medications   diltiazem (CARDIZEM) 90 MG tablet   Other Relevant Orders   CBC     Endocrine   Diabetes mellitus type 2, uncomplicated (HCC)    Previously well controlled Continue ramipril for previous elevated urine microalbumin Continue metformin daily Recheck A1c If A1c is elevated, consider increasing metformin to twice daily dosing Foot exam at next visit Request records from last eye exam Will update vaccination records      Relevant Orders   Hemoglobin A1c     Genitourinary   Recurrent genital HSV (herpes simplex virus) infection    Continue Valtrex as needed for outbreaks      Relevant Medications   valACYclovir (VALTREX) 1000 MG tablet     Other   Affective disorder, major    Patient is doing well with stable mood Continue Effexor at current dose Could consider higher dose in the future if needs any titration      Relevant Medications   ALPRAZolam (XANAX) 0.25 MG tablet   venlafaxine XR (EFFEXOR-XR) 37.5 MG 24 hr capsule   Hyperlipidemia    Patient is followed by cardiology Cardiology stopped Crestor a few weeks ago when patient was having significant myalgias.  Their plan is to recheck her lipid panel in about 6 months Pending her lipid panel, she may be a good candidate for a PCSK9 inhibitor      Relevant Medications   diltiazem (CARDIZEM) 90 MG tablet    Other Visit Diagnoses    Salivary gland disorder       Relevant Orders   Ambulatory referral to ENT       Return in about 3 months (around 11/22/2017) for CPE.   The entirety of the information documented in the History of Present Illness, Review of Systems  and Physical Exam were personally obtained by me. Portions of this information were initially documented by Raquel Sarna Ratchford, CMA and reviewed by me for thoroughness and accuracy.    Virginia Crews, MD, MPH [facility] 08/22/2017 4:50 PM

## 2017-08-22 NOTE — Assessment & Plan Note (Signed)
Followed by cardiology Doing well on current medications Rate controlled and rhythm controlled today Continue Eliquis, flecainide and diltiazem

## 2017-08-22 NOTE — Assessment & Plan Note (Signed)
Well-controlled Continue current medications Check BMP

## 2017-08-22 NOTE — Assessment & Plan Note (Signed)
Patient is followed by cardiology Cardiology stopped Crestor a few weeks ago when patient was having significant myalgias.  Their plan is to recheck her lipid panel in about 6 months Pending her lipid panel, she may be a good candidate for a PCSK9 inhibitor

## 2017-08-22 NOTE — Assessment & Plan Note (Signed)
Patient is doing well with stable mood Continue Effexor at current dose Could consider higher dose in the future if needs any titration

## 2017-08-22 NOTE — Assessment & Plan Note (Signed)
Previously well controlled Continue ramipril for previous elevated urine microalbumin Continue metformin daily Recheck A1c If A1c is elevated, consider increasing metformin to twice daily dosing Foot exam at next visit Request records from last eye exam Will update vaccination records

## 2017-08-23 ENCOUNTER — Telehealth: Payer: Self-pay

## 2017-08-23 LAB — BASIC METABOLIC PANEL
BUN / CREAT RATIO: 18 (ref 9–23)
BUN: 10 mg/dL (ref 6–24)
CALCIUM: 9.6 mg/dL (ref 8.7–10.2)
CHLORIDE: 101 mmol/L (ref 96–106)
CO2: 24 mmol/L (ref 20–29)
CREATININE: 0.57 mg/dL (ref 0.57–1.00)
GFR, EST AFRICAN AMERICAN: 120 mL/min/{1.73_m2} (ref >59)
GFR, EST NON AFRICAN AMERICAN: 104 mL/min/{1.73_m2} (ref >59)
Glucose: 127 mg/dL — ABNORMAL HIGH (ref 65–99)
Potassium: 4 mmol/L (ref 3.5–5.2)
Sodium: 140 mmol/L (ref 134–144)

## 2017-08-23 LAB — CBC
HEMATOCRIT: 41.4 % (ref 34.0–46.6)
HEMOGLOBIN: 14.5 g/dL (ref 11.1–15.9)
MCH: 30.3 pg (ref 26.6–33.0)
MCHC: 35 g/dL (ref 31.5–35.7)
MCV: 87 fL (ref 79–97)
Platelets: 251 10*3/uL (ref 150–450)
RBC: 4.78 x10E6/uL (ref 3.77–5.28)
RDW: 12.9 % (ref 12.3–15.4)
WBC: 6.6 10*3/uL (ref 3.4–10.8)

## 2017-08-23 LAB — HEMOGLOBIN A1C
ESTIMATED AVERAGE GLUCOSE: 134 mg/dL
Hgb A1c MFr Bld: 6.3 % — ABNORMAL HIGH (ref 4.8–5.6)

## 2017-08-23 NOTE — Telephone Encounter (Signed)
-----   Message from Virginia Crews, MD sent at 08/23/2017  9:12 AM EDT ----- A1c is well controlled at 6.3.  Normal kidney function and electrolytes.  Normal blood counts.  Virginia Crews, MD, MPH [facility] 08/23/2017 9:12 AM

## 2017-08-23 NOTE — Telephone Encounter (Signed)
Pt advised.

## 2017-09-05 ENCOUNTER — Encounter: Payer: Self-pay | Admitting: Physician Assistant

## 2017-09-05 ENCOUNTER — Ambulatory Visit: Payer: BLUE CROSS/BLUE SHIELD | Admitting: Physician Assistant

## 2017-09-05 VITALS — BP 120/78 | HR 69 | Temp 97.8°F | Resp 16 | Wt 204.2 lb

## 2017-09-05 DIAGNOSIS — J069 Acute upper respiratory infection, unspecified: Secondary | ICD-10-CM | POA: Diagnosis not present

## 2017-09-05 NOTE — Patient Instructions (Addendum)
Nasal irrigations Mucinex DM Flonase [place] (vertigo)  How to Perform the Epley Maneuver The Epley maneuver is an exercise that relieves symptoms of vertigo. Vertigo is the feeling that you or your surroundings are moving when they are not. When you feel vertigo, you may feel like the room is spinning and have trouble walking. Dizziness is a little different than vertigo. When you are dizzy, you may feel unsteady or light-headed. You can do this maneuver at home whenever you have symptoms of vertigo. You can do it up to 3 times a day until your symptoms go away. Even though the Epley maneuver may relieve your vertigo for a few weeks, it is possible that your symptoms will return. This maneuver relieves vertigo, but it does not relieve dizziness. What are the risks? If it is done correctly, the Epley maneuver is considered safe. Sometimes it can lead to dizziness or nausea that goes away after a short time. If you develop other symptoms, such as changes in vision, weakness, or numbness, stop doing the maneuver and call your health care provider. How to perform the Epley maneuver 1. Sit on the edge of a bed or table with your back straight and your legs extended or hanging over the edge of the bed or table. 2. Turn your head halfway toward the affected ear or side. 3. Lie backward quickly with your head turned until you are lying flat on your back. You may want to position a pillow under your shoulders. 4. Hold this position for 30 seconds. You may experience an attack of vertigo. This is normal. 5. Turn your head to the opposite direction until your unaffected ear is facing the floor. 6. Hold this position for 30 seconds. You may experience an attack of vertigo. This is normal. Hold this position until the vertigo stops. 7. Turn your whole body to the same side as your head. Hold for another 30 seconds. 8. Sit back up. You can repeat this exercise up to 3 times a day. Follow these instructions at  home:  After doing the Epley maneuver, you can return to your normal activities.  Ask your health care provider if there is anything you should do at home to prevent vertigo. He or she may recommend that you: ? Keep your head raised (elevated) with two or more pillows while you sleep. ? Do not sleep on the side of your affected ear. ? Get up slowly from bed. ? Avoid sudden movements during the day. ? Avoid extreme head movement, like looking up or bending over. Contact a health care provider if:  Your vertigo gets worse.  You have other symptoms, including: ? Nausea. ? Vomiting. ? Headache. Get help right away if:  You have vision changes.  You have a severe or worsening headache or neck pain.  You cannot stop vomiting.  You have new numbness or weakness in any part of your body. Summary  Vertigo is the feeling that you or your surroundings are moving when they are not.  The Epley maneuver is an exercise that relieves symptoms of vertigo.  If the Epley maneuver is done correctly, it is considered safe. You can do it up to 3 times a day. This information is not intended to replace advice given to you by your health care provider. Make sure you discuss any questions you have with your health care provider. Document Released: 03/25/2013 Document Revised: 02/08/2016 Document Reviewed: 02/08/2016 Elsevier Interactive Patient Education  2017 [employer].

## 2017-09-05 NOTE — Progress Notes (Signed)
Patient: Julia MICHALOWSKI Female    DOB: 1960-08-15   57 y.o.   MRN: [id number] Visit Date: 09/05/2017  Today's Provider: Mar Daring, PA-C   Chief Complaint  Patient presents with  . URI   Subjective:    URI   This is a new problem. The current episode started yesterday. There has been no fever. Associated symptoms include congestion, coughing, ear pain (Left ear pain specially when she bends over-she is scheduled to go see ENT), headaches (slight headache) and rhinorrhea. Pertinent negatives include no chest pain, sneezing, sore throat or wheezing.      Allergies  Allergen Reactions  . Rosuvastatin     muscle cramps     Current Outpatient Medications:  .  ALPRAZolam (XANAX) 0.25 MG tablet, Take 0.25 mg by mouth as needed. , Disp: , Rfl:  .  apixaban (ELIQUIS) 5 MG TABS tablet, Take 5 mg by mouth 2 (two) times daily., Disp: , Rfl:  .  Biotin 5 MG TABS, Take 5 mg by mouth daily. , Disp: , Rfl:  .  diltiazem (CARDIZEM) 90 MG tablet, Take 1 tablet by mouth. Take 2-3 times per day PRN, Disp: , Rfl:  .  flecainide (TAMBOCOR) 100 MG tablet, Take 100 mg by mouth 2 (two) times daily., Disp: , Rfl:  .  magnesium oxide (MAG-OX) 400 MG tablet, Take 400 mg by mouth daily., Disp: , Rfl:  .  metFORMIN (GLUCOPHAGE) 1000 MG tablet, Take 1,000 mg by mouth daily., Disp: , Rfl:  .  Multiple Vitamin (MULTIVITAMIN) tablet, Take 1 tablet by mouth daily., Disp: , Rfl:  .  ramipril (ALTACE) 10 MG capsule, Take 1.25 mg by mouth daily. , Disp: , Rfl:  .  valACYclovir (VALTREX) 1000 MG tablet, Take 1,000 mg by mouth daily. Take daily for 5 days as needed for outbreak, Disp: , Rfl:  .  venlafaxine XR (EFFEXOR-XR) 37.5 MG 24 hr capsule, , Disp: , Rfl:  .  vitamin E 400 UNIT capsule, Take 400 Units by mouth. , Disp: , Rfl:   Review of Systems  Constitutional: Positive for fatigue. Negative for fever.  HENT: Positive for congestion, ear pain (Left ear pain specially when she bends over-she is  scheduled to go see ENT), postnasal drip, rhinorrhea and sinus pressure. Negative for sneezing, sore throat and trouble swallowing.   Respiratory: Positive for cough. Negative for chest tightness, shortness of breath and wheezing.   Cardiovascular: Negative for chest pain, palpitations and leg swelling.  Neurological: Positive for dizziness and headaches (slight headache).  Psychiatric/Behavioral: Positive for sleep disturbance.    Social History   Tobacco Use  . Smoking status: Never Smoker  . Smokeless tobacco: Never Used  Substance Use Topics  . Alcohol use: Yes    Alcohol/week: 2.4 oz    Types: 2 Glasses of wine, 2 Cans of beer per week   Objective:   BP 120/78 (BP Location: Left Arm, Patient Position: Sitting, Cuff Size: Normal)   Pulse 69   Temp 97.8 F (36.6 C) (Oral)   Resp 16   Wt 204 lb 3.2 oz (92.6 kg)   LMP 04/13/2011   PF 98 L/min   BMI 32.22 kg/m  Vitals:   09/05/17 1433  BP: 120/78  Pulse: 69  Resp: 16  Temp: 97.8 F (36.6 C)  TempSrc: Oral  Weight: 204 lb 3.2 oz (92.6 kg)  PF: 98 L/min     Physical Exam  Constitutional: She appears well-developed and  well-nourished. No distress.  HENT:  Head: Normocephalic and atraumatic.  Right Ear: Hearing, tympanic membrane, external ear and ear canal normal.  Left Ear: Hearing, tympanic membrane, external ear and ear canal normal.  Nose: Nose normal.  Mouth/Throat: Uvula is midline, oropharynx is clear and moist and mucous membranes are normal. No oropharyngeal exudate.  Eyes: Pupils are equal, round, and reactive to light. Conjunctivae are normal. Right eye exhibits no discharge. Left eye exhibits no discharge. No scleral icterus.  Neck: Normal range of motion. Neck supple. No tracheal deviation present. No thyromegaly present.  Cardiovascular: Normal rate, regular rhythm and normal heart sounds. Exam reveals no gallop and no friction rub.  No murmur heard. Pulmonary/Chest: Effort normal and breath sounds  normal. No stridor. No respiratory distress. She has no wheezes. She has no rales.  Lymphadenopathy:    She has no cervical adenopathy.  Skin: Skin is warm and dry. [name] not diaphoretic.  Vitals reviewed.       Assessment & Plan:     1. Viral upper respiratory tract infection OTC symptomatic relief prn. Nasal irrigations. Flonase for ETD. Push fluids, rest. Call if symptoms fail to improve in 7-10 days.   Mar Daring, PA-C  [facility]

## 2017-09-18 DIAGNOSIS — K1121 Acute sialoadenitis: Secondary | ICD-10-CM | POA: Diagnosis not present

## 2017-10-31 ENCOUNTER — Encounter: Payer: Self-pay | Admitting: Family Medicine

## 2017-11-01 LAB — LIPID PANEL
CHOLESTEROL: 247 — AB (ref 0–200)
HDL: 47 (ref 35–70)
LDL CALC: 154
TRIGLYCERIDES: 229 — AB (ref 40–160)

## 2017-11-01 LAB — BASIC METABOLIC PANEL
Creatinine: 0.7 (ref 0.5–1.1)
Glucose: 110

## 2017-11-01 LAB — HEMOGLOBIN A1C: Hgb A1c MFr Bld: 6 (ref 4.0–6.0)

## 2017-11-07 DIAGNOSIS — Z85828 Personal history of other malignant neoplasm of skin: Secondary | ICD-10-CM | POA: Diagnosis not present

## 2017-11-07 DIAGNOSIS — L578 Other skin changes due to chronic exposure to nonionizing radiation: Secondary | ICD-10-CM | POA: Diagnosis not present

## 2017-11-07 DIAGNOSIS — L570 Actinic keratosis: Secondary | ICD-10-CM | POA: Diagnosis not present

## 2017-11-22 ENCOUNTER — Ambulatory Visit (INDEPENDENT_AMBULATORY_CARE_PROVIDER_SITE_OTHER): Payer: BLUE CROSS/BLUE SHIELD | Admitting: Family Medicine

## 2017-11-22 ENCOUNTER — Encounter: Payer: Self-pay | Admitting: Family Medicine

## 2017-11-22 VITALS — BP 112/68 | HR 68 | Temp 98.0°F | Resp 16 | Ht 67.0 in | Wt 194.0 lb

## 2017-11-22 DIAGNOSIS — Z1231 Encounter for screening mammogram for malignant neoplasm of breast: Secondary | ICD-10-CM | POA: Diagnosis not present

## 2017-11-22 DIAGNOSIS — E669 Obesity, unspecified: Secondary | ICD-10-CM

## 2017-11-22 DIAGNOSIS — Z1159 Encounter for screening for other viral diseases: Secondary | ICD-10-CM

## 2017-11-22 DIAGNOSIS — Z6830 Body mass index (BMI) 30.0-30.9, adult: Secondary | ICD-10-CM

## 2017-11-22 DIAGNOSIS — Z114 Encounter for screening for human immunodeficiency virus [HIV]: Secondary | ICD-10-CM

## 2017-11-22 DIAGNOSIS — Z0000 Encounter for general adult medical examination without abnormal findings: Secondary | ICD-10-CM | POA: Diagnosis not present

## 2017-11-22 DIAGNOSIS — Z1239 Encounter for other screening for malignant neoplasm of breast: Secondary | ICD-10-CM

## 2017-11-22 NOTE — Progress Notes (Signed)
Patient: Julia Stark, Female    DOB: 09/02/1960, 57 y.o.   MRN: [id number] Visit Date: 11/22/2017  Today's Provider: Lavon Paganini, MD   I, Martha Clan, CMA, am acting as scribe for Lavon Paganini, MD.  Chief Complaint  Patient presents with  . Annual Exam   Subjective:    Annual physical exam Julia Stark is a 57 y.o. female who presents today for health maintenance and complete physical. She feels well. She reports exercising daily. Walks dog about 3 times per day. She reports she is sleeping poorly. Wakes up around 2:00 am, and has difficulty falling back asleep.  Last mammogram- 03/01/2016- BI-RADS 1 Last pap- 01/18/2016- NIL; HPV Neg Last colonoscopy- 04/09/2017- internal hemorrhoids. Otherwise WNL. Repeat 5 years due to personal H/O adenomatous polyps.  -----------------------------------------------------------------   Review of Systems  Constitutional: Positive for diaphoresis and fatigue. Negative for activity change, appetite change, chills, fever and unexpected weight change.  HENT: Positive for ear pain. Negative for congestion, dental problem, drooling, ear discharge, facial swelling, hearing loss, mouth sores, nosebleeds, postnasal drip, rhinorrhea, sinus pressure, sinus pain, sneezing, sore throat, tinnitus, trouble swallowing and voice change.   Eyes: Positive for pain and itching. Negative for photophobia, discharge, redness and visual disturbance.  Respiratory: Negative.   Cardiovascular: Positive for palpitations (afib). Negative for chest pain and leg swelling.  Gastrointestinal: Negative.   Endocrine: Negative.   Genitourinary: Positive for enuresis. Negative for decreased urine volume, difficulty urinating, dyspareunia, dysuria, flank pain, frequency, genital sores, hematuria, menstrual problem, pelvic pain, urgency, vaginal bleeding, vaginal discharge and vaginal pain.  Musculoskeletal: Positive for arthralgias and myalgias. Negative  for back pain, gait problem, joint swelling, neck pain and neck stiffness.  Skin: Negative.   Allergic/Immunologic: Negative.   Neurological: Negative.   Hematological: Negative.   Psychiatric/Behavioral: Positive for sleep disturbance. Negative for agitation, behavioral problems, confusion, decreased concentration, dysphoric mood, hallucinations, self-injury and suicidal ideas. The patient is not nervous/anxious and is not hyperactive.     Social History      She  reports that she has never smoked. She has never used smokeless tobacco. She reports that she drinks about 4.0 standard drinks of alcohol per week. She reports that she does not use drugs.       Social History   Socioeconomic History  . Marital status: Divorced    Spouse name: Not on file  . Number of children: 3  . Years of education: 17  . Highest education level: High school graduate  Occupational History    Employer: [employer]    Comment: in special [place]  . Financial resource strain: Not on file  . Food insecurity:    Worry: Not on file    Inability: Not on file  . Transportation needs:    Medical: Not on file    Non-medical: Not on file  Tobacco Use  . Smoking status: Never Smoker  . Smokeless tobacco: Never Used  Substance and Sexual Activity  . Alcohol use: Yes    Alcohol/week: 4.0 standard drinks    Types: 2 Glasses of wine, 2 Cans of beer per week  . Drug use: No  . Sexual activity: Yes    Partners: Male    Birth control/protection: Post-menopausal, Surgical  Lifestyle  . Physical activity:    Days per week: Not on file    Minutes per session: Not on file  . Stress: Not on file  Relationships  . Social  connections:    Talks on phone: Not on file    Gets together: Not on file    Attends religious service: Not on file    Active member of club or organization: Not on file    Attends meetings of clubs or organizations: Not on file    Relationship status: Not on file  Other Topics  Concern  . Not on file  Social History Narrative  . Not on file    Past Medical History:  Diagnosis Date  . Anxiety   . Arthritis    bilateral shoulders  . Atrial fibrillation ([place])   . Cancer ([place])    BASAL CELL CARCINOMA OF FACE  . Depression   . Diabetes mellitus without complication ([place])   . Dysrhythmia    A fib  . Hyperlipidemia   . Hypertension      Patient Active Problem List   Diagnosis Date Noted  . Affective disorder, major 08/22/2017  . Allergic rhinitis 08/22/2017  . Anxiety, generalized 08/22/2017  . Diabetes mellitus type 2, uncomplicated ([place]) 11/91/4782  . Essential hypertension 08/22/2017  . Hyperlipidemia 08/22/2017  . Mobitz type 2 second degree atrioventricular block 02/05/2017  . Paroxysmal A-fib ([place]) 05/25/2015  . Recurrent genital HSV (herpes simplex virus) infection 03/13/2014  . Chronic constipation 02/17/2014  . Reflux gastritis 02/17/2014  . Adenomatous polyp of descending colon 02/12/2012    Past Surgical History:  Procedure Laterality Date  . BASAL CELL CARCINOMA EXCISION    . CESAREAN SECTION  1999  . CHOLECYSTECTOMY    . COLONOSCOPY  02/12/2012  . COLONOSCOPY WITH PROPOFOL N/A 04/09/2017   Procedure: COLONOSCOPY WITH PROPOFOL;  Surgeon: Manya Silvas, MD;  Location: [facility] ENDOSCOPY;  Service: Endoscopy;  Laterality: N/A;  . ELECTROPHYSIOLOGIC STUDY N/A 05/12/2015   Procedure: Cardioversion;  Surgeon: Corey Skains, MD;  Location: [facility] ORS;  Service: Cardiovascular;  Laterality: N/A;  . [place]    Family History        Family Status  Relation Name Status  . Mother  (Not Specified)  . Father  (Not Specified)  . Mat Uncle  (Not Specified)  . MGF  (Not Specified)  . PGM  (Not Specified)  . PGF  (Not Specified)  . Cousin  (Not Specified)  . Sister  Alive  . Brother  Alive  . Neg Hx  (Not Specified)        Her family history includes Alzheimer's disease in her father; Colon cancer in her maternal uncle;  Depression in her mother and sister; Diabetes in her brother, mother, paternal grandmother, and sister; Heart disease in her paternal grandmother; Hyperlipidemia in her maternal grandfather; Hypertension in her maternal grandfather and mother; Ovarian cancer in her cousin; Prostate cancer in her paternal grandfather; Skin cancer in her mother. There is no history of Breast cancer or Cervical cancer.      Allergies  Allergen Reactions  . Rosuvastatin     muscle cramps     Current Outpatient Medications:  .  ALPRAZolam (XANAX) 0.25 MG tablet, Take 0.25 mg by mouth as needed. , Disp: , Rfl:  .  apixaban (ELIQUIS) 5 MG TABS tablet, Take 5 mg by mouth 2 (two) times daily., Disp: , Rfl:  .  Biotin 5 MG TABS, Take 5 mg by mouth daily. , Disp: , Rfl:  .  diltiazem (CARDIZEM) 90 MG tablet, Take 1 tablet by mouth. Take 2-3 times per day PRN, Disp: , Rfl:  .  flecainide (TAMBOCOR)  100 MG tablet, Take 100 mg by mouth 2 (two) times daily., Disp: , Rfl:  .  magnesium oxide (MAG-OX) 400 MG tablet, Take 400 mg by mouth daily., Disp: , Rfl:  .  metFORMIN (GLUCOPHAGE) 1000 MG tablet, Take 1,000 mg by mouth daily., Disp: , Rfl:  .  Multiple Vitamin (MULTIVITAMIN) tablet, Take 1 tablet by mouth daily., Disp: , Rfl:  .  pravastatin (PRAVACHOL) 40 MG tablet, Take 1 tablet by mouth daily., Disp: , Rfl:  .  ramipril (ALTACE) 10 MG capsule, Take 1.25 mg by mouth daily. , Disp: , Rfl:  .  valACYclovir (VALTREX) 1000 MG tablet, Take 1,000 mg by mouth daily. Take daily for 5 days as needed for outbreak, Disp: , Rfl:  .  venlafaxine XR (EFFEXOR-XR) 37.5 MG 24 hr capsule, , Disp: , Rfl:  .  vitamin E 400 UNIT capsule, Take 400 Units by mouth. , Disp: , Rfl:    Patient Care Team: Virginia Crews, MD as PCP - General (Family Medicine)      Objective:   Vitals: BP 112/68 (BP Location: Left Arm, Patient Position: Sitting, Cuff Size: Normal)   Pulse 68   Temp 98 F (36.7 C) (Oral)   Resp 16   Ht 5\' 7"  (1.702  m)   Wt 194 lb (88 kg)   LMP 04/13/2011   SpO2 98%   BMI 30.38 kg/m    Vitals:   11/22/17 1511  BP: 112/68  Pulse: 68  Resp: 16  Temp: 98 F (36.7 C)  TempSrc: Oral  SpO2: 98%  Weight: 194 lb (88 kg)  Height: 5\' 7"  (1.702 m)     Physical Exam  Constitutional: [name] oriented to person, place, and time. She appears well-developed and well-nourished. No distress.  HENT:  Head: Normocephalic and atraumatic.  Right Ear: External ear normal.  Left Ear: External ear normal.  Nose: Nose normal.  Mouth/Throat: Oropharynx is clear and moist.  Eyes: Pupils are equal, round, and reactive to light. Conjunctivae and EOM are normal. No scleral icterus.  Neck: Neck supple. No thyromegaly present.  Cardiovascular: Normal rate, regular rhythm, normal heart sounds and intact distal pulses.  No murmur heard. Pulmonary/Chest: Effort normal and breath sounds normal. No respiratory distress. She has no wheezes. She has no rales.  Abdominal: Soft. Bowel sounds are normal. She exhibits no distension. There is no tenderness. There is no rebound and no guarding.  Genitourinary:  Genitourinary Comments: Breasts: breasts appear normal, no suspicious masses, no skin or nipple changes or axillary nodes.   Musculoskeletal: She exhibits no edema or deformity.  Lymphadenopathy:    She has no cervical adenopathy.  Neurological: [name] alert and oriented to person, place, and time.  Skin: Skin is warm and dry. Capillary refill takes less than 2 seconds. No rash noted.  Psychiatric: She has a normal mood and affect. Her behavior is normal.  Vitals reviewed.   Diabetic Foot Exam - Simple   Simple Foot Form Diabetic Foot exam was performed with the following findings:  Yes 11/22/2017  3:50 PM  Visual Inspection No deformities, no ulcerations, no other skin breakdown bilaterally:  Yes Sensation Testing Intact to touch and monofilament testing bilaterally:  Yes Pulse Check Posterior Tibialis and Dorsalis  pulse intact bilaterally:  Yes Comments      Depression Screen PHQ 2/9 Scores 11/22/2017 08/22/2017  PHQ - 2 Score 0 0  PHQ- 9 Score 2 3    Assessment & Plan:  Routine Health Maintenance and Physical Exam  Exercise Activities and Dietary recommendations Goals   None     Immunization History  Administered Date(s) Administered  . Influenza,inj,Quad PF,6+ Mos 01/17/2017  . Pneumococcal Polysaccharide-23 11/28/2011  . Tdap 03/13/2014    Health Maintenance  Topic Date Due  . Hepatitis C Screening  1960-08-14  . FOOT EXAM  08/28/1970  . HIV Screening  08/28/1975  . OPHTHALMOLOGY EXAM  10/17/2017  . INFLUENZA VACCINE  11/01/2017  . MAMMOGRAM  02/21/2018  . HEMOGLOBIN A1C  02/22/2018  . PAP SMEAR  01/17/2021  . TETANUS/TDAP  03/13/2024  . COLONOSCOPY  04/10/2027  . PNEUMOCOCCAL POLYSACCHARIDE VACCINE AGE 72-64 HIGH RISK  Completed     Discussed health benefits of physical activity, and encouraged her to engage in regular exercise appropriate for her age and condition.    Will schedule eye exam  Will get flu shot at work  Recently had labs done for health screening at work and will bring copy back by --------------------------------------------------------------------  Problem List Items Addressed This Visit    None    Visit Diagnoses    Encounter for annual physical exam    -  Primary   Screening for breast cancer       Relevant Orders   MM 3D SCREEN BREAST BILATERAL   Need for hepatitis C screening test       Relevant Orders   Hepatitis C Antibody   Screening for HIV (human immunodeficiency virus)       Relevant Orders   HIV antibody (with reflex)   Class 1 obesity without serious comorbidity with body mass index (BMI) of 30.0 to 30.9 in adult, unspecified obesity type           Return in about 6 months (around 05/25/2018) for chronic disease f/u.   The entirety of the information documented in the History of Present Illness, Review of Systems and  Physical Exam were personally obtained by me. Portions of this information were initially documented by Raquel Sarna Ratchford, CMA and reviewed by me for thoroughness and accuracy.    Virginia Crews, MD, MPH [facility] 11/22/2017 3:50 PM

## 2017-11-22 NOTE — Patient Instructions (Addendum)
Schedule a diabetic eye exam  Bring by or fax a copy of your labs   Preventive Care 40-64 Years, Female Preventive care refers to lifestyle choices and visits with your health care provider that can promote health and wellness. What does preventive care include?  A yearly physical exam. This is also called an annual well check.  Dental exams once or twice a year.  Routine eye exams. Ask your health care provider how often you should have your eyes checked.  Personal lifestyle choices, including: ? Daily care of your teeth and gums. ? Regular physical activity. ? Eating a healthy diet. ? Avoiding tobacco and drug use. ? Limiting alcohol use. ? Practicing safe sex. ? Taking low-dose aspirin daily starting at age 45. ? Taking vitamin and mineral supplements as recommended by your health care provider. What happens during an annual well check? The services and screenings done by your health care provider during your annual well check will depend on your age, overall health, lifestyle risk factors, and family history of disease. Counseling Your health care provider may ask you questions about your:  Alcohol use.  Tobacco use.  Drug use.  Emotional well-being.  Home and relationship well-being.  Sexual activity.  Eating habits.  Work and work Statistician.  Method of birth control.  Menstrual cycle.  Pregnancy history.  Screening You may have the following tests or measurements:  Height, weight, and BMI.  Blood pressure.  Lipid and cholesterol levels. These may be checked every 5 years, or more frequently if you are over 69 years old.  Skin check.  Lung cancer screening. You may have this screening every year starting at age 58 if you have a 30-pack-year history of smoking and currently smoke or have quit within the past 15 years.  Fecal occult blood test (FOBT) of the stool. You may have this test every year starting at age 14.  Flexible sigmoidoscopy or  colonoscopy. You may have a sigmoidoscopy every 5 years or a colonoscopy every 10 years starting at age 29.  Hepatitis C blood test.  Hepatitis B blood test.  Sexually transmitted disease (STD) testing.  Diabetes screening. This is done by checking your blood sugar (glucose) after you have not eaten for a while (fasting). You may have this done every 1-3 years.  Mammogram. This may be done every 1-2 years. Talk to your health care provider about when you should start having regular mammograms. This may depend on whether you have a family history of breast cancer.  BRCA-related cancer screening. This may be done if you have a family history of breast, ovarian, tubal, or peritoneal cancers.  Pelvic exam and Pap test. This may be done every 3 years starting at age 39. Starting at age 98, this may be done every 5 years if you have a Pap test in combination with an HPV test.  Bone density scan. This is done to screen for osteoporosis. You may have this scan if you are at high risk for osteoporosis.  Discuss your test results, treatment options, and if necessary, the need for more tests with your health care provider. Vaccines Your health care provider may recommend certain vaccines, such as:  Influenza vaccine. This is recommended every year.  Tetanus, diphtheria, and acellular pertussis (Tdap, Td) vaccine. You may need a Td booster every 10 years.  Varicella vaccine. You may need this if you have not been vaccinated.  Zoster vaccine. You may need this after age 67.  Measles, mumps, and  rubella (MMR) vaccine. You may need at least one dose of MMR if you were born in 1957 or later. You may also need a second dose.  Pneumococcal 13-valent conjugate (PCV13) vaccine. You may need this if you have certain conditions and were not previously vaccinated.  Pneumococcal polysaccharide (PPSV23) vaccine. You may need one or two doses if you smoke cigarettes or if you have certain  conditions.  Meningococcal vaccine. You may need this if you have certain conditions.  Hepatitis A vaccine. You may need this if you have certain conditions or if you travel or work in places where you may be exposed to hepatitis A.  Hepatitis B vaccine. You may need this if you have certain conditions or if you travel or work in places where you may be exposed to hepatitis B.  Haemophilus influenzae type b (Hib) vaccine. You may need this if you have certain conditions.  Talk to your health care provider about which screenings and vaccines you need and how often you need them. This information is not intended to replace advice given to you by your health care provider. Make sure you discuss any questions you have with your health care provider. Document Released: 04/16/2015 Document Revised: 12/08/2015 Document Reviewed: 01/19/2015 Elsevier Interactive Patient Education  [employer].

## 2017-11-23 ENCOUNTER — Telehealth: Payer: Self-pay

## 2017-11-23 LAB — HEPATITIS C ANTIBODY: Hep C Virus Ab: <0.1 s/co ratio (ref 0.0–0.9)

## 2017-11-23 LAB — HIV ANTIBODY (ROUTINE TESTING W REFLEX): HIV SCREEN 4TH GENERATION: NONREACTIVE

## 2017-11-23 NOTE — Telephone Encounter (Signed)
-----   Message from Virginia Crews, MD sent at 11/23/2017  8:24 AM EDT ----- Hep C and HIV screen negative  Brita Romp Dionne Bucy, MD, MPH [facility] 11/23/2017 8:24 AM

## 2017-11-23 NOTE — Telephone Encounter (Signed)
Pt advised.

## 2017-12-24 LAB — RUBELLA ANTIBODY, IGM
Mumps IgG: >300
RUBELLA: 21.9

## 2018-01-31 ENCOUNTER — Encounter: Payer: Self-pay | Admitting: Family Medicine

## 2018-01-31 MED ORDER — VALACYCLOVIR HCL 1 G PO TABS: 1000.0000 mg | ORAL_TABLET | Freq: Every day | ORAL | 3 refills | Status: DC

## 2018-02-07 DIAGNOSIS — L578 Other skin changes due to chronic exposure to nonionizing radiation: Secondary | ICD-10-CM | POA: Diagnosis not present

## 2018-02-07 DIAGNOSIS — L570 Actinic keratosis: Secondary | ICD-10-CM | POA: Diagnosis not present

## 2018-02-07 DIAGNOSIS — L821 Other seborrheic keratosis: Secondary | ICD-10-CM | POA: Diagnosis not present

## 2018-02-07 DIAGNOSIS — D2230 Melanocytic nevi of unspecified part of face: Secondary | ICD-10-CM | POA: Diagnosis not present

## 2018-02-07 DIAGNOSIS — L812 Freckles: Secondary | ICD-10-CM | POA: Diagnosis not present

## 2018-02-25 DIAGNOSIS — E119 Type 2 diabetes mellitus without complications: Secondary | ICD-10-CM | POA: Diagnosis not present

## 2018-02-25 DIAGNOSIS — I480 Paroxysmal atrial fibrillation: Secondary | ICD-10-CM | POA: Diagnosis not present

## 2018-02-25 DIAGNOSIS — E782 Mixed hyperlipidemia: Secondary | ICD-10-CM | POA: Diagnosis not present

## 2018-02-25 DIAGNOSIS — I10 Essential (primary) hypertension: Secondary | ICD-10-CM | POA: Diagnosis not present

## 2018-03-18 ENCOUNTER — Encounter: Payer: Self-pay | Admitting: Family Medicine

## 2018-03-18 MED ORDER — VENLAFAXINE HCL ER 37.5 MG PO CP24: 37.5000 mg | ORAL_CAPSULE | Freq: Every day | ORAL | 3 refills | Status: DC

## 2018-05-27 ENCOUNTER — Ambulatory Visit: Payer: Self-pay | Admitting: Family Medicine

## 2018-06-14 ENCOUNTER — Ambulatory Visit
Admission: RE | Admit: 2018-06-14 | Discharge: 2018-06-14 | Disposition: A | Discharge status: Home / Self Care | Payer: Managed Care, Other (non HMO) | Source: Ambulatory Visit | Attending: Family Medicine | Admitting: Family Medicine

## 2018-06-14 ENCOUNTER — Other Ambulatory Visit: Payer: Self-pay

## 2018-06-14 DIAGNOSIS — Z1239 Encounter for other screening for malignant neoplasm of breast: Secondary | ICD-10-CM

## 2018-06-14 DIAGNOSIS — Z1231 Encounter for screening mammogram for malignant neoplasm of breast: Secondary | ICD-10-CM | POA: Diagnosis not present

## 2018-06-17 ENCOUNTER — Ambulatory Visit: Payer: Managed Care, Other (non HMO) | Admitting: Family Medicine

## 2018-06-17 ENCOUNTER — Encounter: Payer: Self-pay | Admitting: Family Medicine

## 2018-06-17 ENCOUNTER — Other Ambulatory Visit: Payer: Self-pay

## 2018-06-17 VITALS — BP 126/75 | HR 64 | Temp 97.9°F | Wt 203.8 lb

## 2018-06-17 DIAGNOSIS — I10 Essential (primary) hypertension: Secondary | ICD-10-CM

## 2018-06-17 DIAGNOSIS — E785 Hyperlipidemia, unspecified: Secondary | ICD-10-CM | POA: Diagnosis not present

## 2018-06-17 DIAGNOSIS — F411 Generalized anxiety disorder: Secondary | ICD-10-CM

## 2018-06-17 DIAGNOSIS — Z7901 Long term (current) use of anticoagulants: Secondary | ICD-10-CM

## 2018-06-17 DIAGNOSIS — E1169 Type 2 diabetes mellitus with other specified complication: Secondary | ICD-10-CM

## 2018-06-17 DIAGNOSIS — I480 Paroxysmal atrial fibrillation: Secondary | ICD-10-CM | POA: Diagnosis not present

## 2018-06-17 NOTE — Progress Notes (Signed)
Patient: Julia Stark Female    DOB: [birth year]   58 y.o.   MRN: [id number] Visit Date: 06/17/2018  Today's Provider: Lavon Paganini, MD   Chief Complaint  Patient presents with  . Diabetes  . Hyperlipidemia  . Hypertension  . Anxiety  . Depression   Subjective:    I, Tiburcio Pea, CMA, am acting as a Education administrator for Lavon Paganini, MD.   HPI  Diabetes Mellitus Type II, Follow-up:   Lab Results  Component Value Date   HGBA1C 6.0 11/01/2017   HGBA1C 6.3 (H) 08/22/2017   HGBA1C 6.2 (A) 11/02/2016   Last seen for diabetes 6 months ago.  Management since then includes no change. She reports good compliance with treatment. [name] not having side effects.  Current symptoms include none  Home blood sugar records: not being checked at home  Episodes of hypoglycemia? no   Current Insulin Regimen: none Most Recent Eye Exam: 10/17/2016 scheduled for 2020 Weight trend: stable Current diet: in general, a "healthy" diet   a lot of fruits and vegetables  Current exercise: no regular exercise  ------------------------------------------------------------------------   Hypertension, follow-up:  BP Readings from Last 3 Encounters:  06/17/18 126/75  11/22/17 112/68  09/05/17 120/78    She was last seen for hypertension 6 months ago.  BP at that visit was 112/68. Management since that visit includes no change .She reports good compliance with treatment. [name] not having side effects.  [name] not exercising. [name] not adherent to low salt diet.   Outside blood pressures are not being checked at home.. [name] experiencing none.  Patient denies chest pain, chest pressure/discomfort, claudication, dyspnea, exertional chest pressure/discomfort, fatigue, irregular heart beat, lower extremity edema, near-syncope, orthopnea, palpitations, paroxysmal nocturnal dyspnea, syncope and tachypnea.   Cardiovascular risk factors include diabetes mellitus, dyslipidemia and  obesity (BMI >= 30 kg/m2).  Use of agents associated with hypertension: none.   ------------------------------------------------------------------------  Lipid/Cholesterol, Follow-up:   Last seen for this 6 months ago.  Management since that visit includes no change.  Last Lipid Panel:    Component Value Date/Time   CHOL 247 (A) 11/01/2017   CHOL 193 10/18/2012 0712   TRIG 229 (A) 11/01/2017   TRIG 215 (H) 10/18/2012 0712   HDL 47 11/01/2017   HDL 37 (L) 10/18/2012 0712   VLDL 43 (H) 10/18/2012 0712   LDLCALC 154 11/01/2017   LDLCALC 113 (H) 10/18/2012 6606    She reports good compliance with treatment. [name] not having side effects.   Wt Readings from Last 3 Encounters:  06/17/18 203 lb 12.8 oz (92.4 kg)  11/22/17 194 lb (88 kg)  09/05/17 204 lb 3.2 oz (92.6 kg)    ------------------------------------------------------------------------ Depression/Anxiety: Last seen for this 6 months ago.  Management since that visit includes no change She reports good compliance with treatment. [name] not having side effects.  She states symptoms are stable.    Allergies  Allergen Reactions  . Rosuvastatin     muscle cramps     Current Outpatient Medications:  .  ALPRAZolam (XANAX) 0.25 MG tablet, Take 0.25 mg by mouth as needed. , Disp: , Rfl:  .  apixaban (ELIQUIS) 5 MG TABS tablet, Take 5 mg by mouth 2 (two) times daily., Disp: , Rfl:  .  Biotin 5 MG TABS, Take 5 mg by mouth daily. , Disp: , Rfl:  .  diltiazem (CARDIZEM) 90 MG tablet, Take 1 tablet by mouth. Take  2-3 times per day PRN, Disp: , Rfl:  .  flecainide (TAMBOCOR) 100 MG tablet, Take 100 mg by mouth 2 (two) times daily., Disp: , Rfl:  .  magnesium oxide (MAG-OX) 400 MG tablet, Take 400 mg by mouth daily., Disp: , Rfl:  .  metFORMIN (GLUCOPHAGE) 1000 MG tablet, Take 1,000 mg by mouth daily., Disp: , Rfl:  .  Multiple Vitamin (MULTIVITAMIN) tablet, Take 1 tablet by mouth daily., Disp: , Rfl:  .  pravastatin  (PRAVACHOL) 40 MG tablet, Take 1 tablet by mouth daily., Disp: , Rfl:  .  ramipril (ALTACE) 10 MG capsule, Take 1.25 mg by mouth daily. , Disp: , Rfl:  .  valACYclovir (VALTREX) 1000 MG tablet, Take 1 tablet (1,000 mg total) by mouth daily. Take daily for 5 days as needed for outbreak, Disp: 5 tablet, Rfl: 3 .  venlafaxine XR (EFFEXOR-XR) 37.5 MG 24 hr capsule, Take 1 capsule (37.5 mg total) by mouth daily with breakfast., Disp: 90 capsule, Rfl: 3 .  vitamin E 400 UNIT capsule, Take 400 Units by mouth. , Disp: , Rfl:    Review of Systems  Constitutional: Negative.   Respiratory: Negative.   Cardiovascular: Negative.   Endocrine: Negative.   Genitourinary: Negative.   Musculoskeletal: Negative.   Neurological: Negative.   Psychiatric/Behavioral: Negative for agitation, behavioral problems, confusion and decreased concentration. The patient is nervous/anxious.     Social History   Tobacco Use  . Smoking status: Never Smoker  . Smokeless tobacco: Never Used  Substance Use Topics  . Alcohol use: Yes    Alcohol/week: 4.0 standard drinks    Types: 2 Glasses of wine, 2 Cans of beer per week      Objective:   BP 126/75 (BP Location: Right Arm, Patient Position: Sitting, Cuff Size: Normal)   Pulse 64   Temp 97.9 F (36.6 C) (Oral)   Wt 203 lb 12.8 oz (92.4 kg)   LMP 04/13/2011   BMI 31.92 kg/m  Vitals:   06/17/18 1559  BP: 126/75  Pulse: 64  Temp: 97.9 F (36.6 C)  TempSrc: Oral  Weight: 203 lb 12.8 oz (92.4 kg)     Physical Exam Vitals signs reviewed.  Constitutional:      General: [name] not in acute distress.    Appearance: Normal appearance. [name] well-developed. [name] not diaphoretic.  HENT:     Head: Normocephalic and atraumatic.  Eyes:     General: No scleral icterus.    Conjunctiva/sclera: Conjunctivae normal.  Neck:     Musculoskeletal: Neck supple.     Thyroid: No thyromegaly.  Cardiovascular:     Rate and Rhythm: Normal rate and regular rhythm.      Pulses: Normal pulses.     Heart sounds: Normal heart sounds. No murmur.  Pulmonary:     Effort: Pulmonary effort is normal. No respiratory distress.     Breath sounds: Normal breath sounds. No wheezing, rhonchi or rales.  Musculoskeletal:     Right lower leg: No edema.     Left lower leg: No edema.  Lymphadenopathy:     Cervical: No cervical adenopathy.  Skin:    General: Skin is warm and dry.     Capillary Refill: Capillary refill takes less than 2 seconds.     Findings: No rash.  Neurological:     Mental Status: [name] alert and oriented to person, place, and time. Mental status is at baseline.  Psychiatric:  Mood and Affect: Mood normal.        Behavior: Behavior normal.         Assessment & Plan   Problem List Items Addressed This Visit      Cardiovascular and Mediastinum   Essential hypertension - Primary    Well controlled Continue current medications Recheck metabolic panel F/u in 6 months       Relevant Orders   Comprehensive metabolic panel (Completed)   Paroxysmal A-fib ([place])    Followed by cardiology Doing well on current medications Continue Eliquis, flecainide, diltiazem Rate and rhythm controlled today        Endocrine   T2DM (type 2 diabetes mellitus) ([place])    Previously well controlled Continue ramipril for urine microalbumin Recheck A1c Continue metformin Continue pravastatin      Relevant Orders   HgB A1c (Completed)     Other   Anxiety, generalized    Stable and well-controlled Continue Effexor      Hyperlipidemia    Patient is also followed by cardiology Crestor was stopped due to significant myalgias Patient is tolerating pravastatin well Recheck lipid panel and CMP      Relevant Orders   Lipid Profile (Completed)   Comprehensive metabolic panel (Completed)   Chronic anticoagulation    On Eliquis for A. fib Recheck CBC today      Relevant Orders   CBC with Differential (Completed)       Return in about 6  months (around 12/18/2018) for CPE.   The entirety of the information documented in the History of Present Illness, Review of Systems and Physical Exam were personally obtained by me. Portions of this information were initially documented by Tiburcio Pea, CMA and reviewed by me for thoroughness and accuracy.    Virginia Crews, MD, MPH [facility] 06/19/2018 12:59 PM

## 2018-06-17 NOTE — Patient Instructions (Signed)

## 2018-06-19 DIAGNOSIS — Z7901 Long term (current) use of anticoagulants: Secondary | ICD-10-CM | POA: Insufficient documentation

## 2018-06-19 LAB — COMPREHENSIVE METABOLIC PANEL
ALK PHOS: 88 IU/L (ref 39–117)
ALT: 37 IU/L — ABNORMAL HIGH (ref 0–32)
AST: 46 IU/L — ABNORMAL HIGH (ref 0–40)
Albumin/Globulin Ratio: 1.6 (ref 1.2–2.2)
Albumin: 4.7 g/dL (ref 3.8–4.9)
BUN/Creatinine Ratio: 20 (ref 9–23)
BUN: 11 mg/dL (ref 6–24)
Bilirubin Total: 0.5 mg/dL (ref 0.0–1.2)
CO2: 24 mmol/L (ref 20–29)
CREATININE: 0.55 mg/dL — AB (ref 0.57–1.00)
Calcium: 9.4 mg/dL (ref 8.7–10.2)
Chloride: 97 mmol/L (ref 96–106)
GFR calc Af Amer: 120 mL/min/{1.73_m2} (ref >59)
GFR calc non Af Amer: 104 mL/min/{1.73_m2} (ref >59)
Globulin, Total: 3 g/dL (ref 1.5–4.5)
Glucose: 152 mg/dL — ABNORMAL HIGH (ref 65–99)
Potassium: 4.5 mmol/L (ref 3.5–5.2)
Sodium: 140 mmol/L (ref 134–144)
Total Protein: 7.7 g/dL (ref 6.0–8.5)

## 2018-06-19 LAB — CBC WITH DIFFERENTIAL/PLATELET
Basophils Absolute: 0 10*3/uL (ref 0.0–0.2)
Basos: 1 %
EOS (ABSOLUTE): 0.2 10*3/uL (ref 0.0–0.4)
Eos: 2 %
Hematocrit: 41.5 % (ref 34.0–46.6)
Hemoglobin: 13.8 g/dL (ref 11.1–15.9)
Immature Grans (Abs): 0 10*3/uL (ref 0.0–0.1)
Immature Granulocytes: 0 %
LYMPHS ABS: 3.1 10*3/uL (ref 0.7–3.1)
Lymphs: 45 %
MCH: 29.7 pg (ref 26.6–33.0)
MCHC: 33.3 g/dL (ref 31.5–35.7)
MCV: 89 fL (ref 79–97)
MONOCYTES: 9 %
Monocytes Absolute: 0.6 10*3/uL (ref 0.1–0.9)
Neutrophils Absolute: 3 10*3/uL (ref 1.4–7.0)
Neutrophils: 43 %
Platelets: 221 10*3/uL (ref 150–450)
RBC: 4.64 x10E6/uL (ref 3.77–5.28)
RDW: 12.3 % (ref 11.7–15.4)
WBC: 6.9 10*3/uL (ref 3.4–10.8)

## 2018-06-19 LAB — HEMOGLOBIN A1C
Est. average glucose Bld gHb Est-mCnc: 134 mg/dL
Hgb A1c MFr Bld: 6.3 % — ABNORMAL HIGH (ref 4.8–5.6)

## 2018-06-19 LAB — LIPID PANEL
Chol/HDL Ratio: 4.1 ratio (ref 0.0–4.4)
Cholesterol, Total: 198 mg/dL (ref 100–199)
HDL: 48 mg/dL (ref >39)
LDL Calculated: 107 mg/dL — ABNORMAL HIGH (ref 0–99)
TRIGLYCERIDES: 217 mg/dL — AB (ref 0–149)
VLDL Cholesterol Cal: 43 mg/dL — ABNORMAL HIGH (ref 5–40)

## 2018-06-19 NOTE — Assessment & Plan Note (Signed)
Followed by cardiology Doing well on current medications Continue Eliquis, flecainide, diltiazem Rate and rhythm controlled today

## 2018-06-19 NOTE — Assessment & Plan Note (Signed)
Well controlled Continue current medications Recheck metabolic panel F/u in 6 months  

## 2018-06-19 NOTE — Assessment & Plan Note (Signed)
Patient is also followed by cardiology Crestor was stopped due to significant myalgias Patient is tolerating pravastatin well Recheck lipid panel and CMP

## 2018-06-19 NOTE — Assessment & Plan Note (Signed)
On Eliquis for A. fib Recheck CBC today

## 2018-06-19 NOTE — Assessment & Plan Note (Signed)
Previously well controlled Continue ramipril for urine microalbumin Recheck A1c Continue metformin Continue pravastatin

## 2018-06-19 NOTE — Assessment & Plan Note (Signed)
Stable and well-controlled Continue Effexor

## 2018-06-20 ENCOUNTER — Telehealth: Payer: Self-pay

## 2018-06-20 NOTE — Telephone Encounter (Signed)
-----   Message from Virginia Crews, MD sent at 06/20/2018  2:57 PM EDT ----- Choelsterol has improved, but not to goal.  Wouldrecommend increasing pravastatin dose.  A1c well controlled.  Normal Blood counts, kidney function, electrolytes.  Liver function is slightly elevated.  Cut back on alcohol and tylenol.  Recheck at next visit.

## 2018-06-20 NOTE — Telephone Encounter (Signed)
Patient was advise and states it fine to increase her pravastatin.

## 2018-06-21 MED ORDER — PRAVASTATIN SODIUM 80 MG PO TABS: 80.0000 mg | ORAL_TABLET | Freq: Every day | ORAL | 3 refills | Status: DC

## 2018-06-21 NOTE — Telephone Encounter (Signed)
Rx sent for 80mg  daily

## 2018-06-28 ENCOUNTER — Encounter: Payer: Self-pay | Admitting: Family Medicine

## 2018-07-01 MED ORDER — METFORMIN HCL 1000 MG PO TABS: 1000.0000 mg | ORAL_TABLET | Freq: Every day | ORAL | 3 refills | Status: DC

## 2018-08-20 LAB — HM DIABETES EYE EXAM

## 2018-08-21 ENCOUNTER — Encounter: Payer: Self-pay | Admitting: Family Medicine

## 2018-08-21 MED ORDER — VALACYCLOVIR HCL 1 G PO TABS: 1000.0000 mg | ORAL_TABLET | Freq: Every day | ORAL | 3 refills | Status: DC

## 2018-09-09 ENCOUNTER — Other Ambulatory Visit: Payer: Self-pay

## 2018-09-09 ENCOUNTER — Encounter: Payer: Self-pay | Admitting: Family Medicine

## 2018-09-09 MED ORDER — RAMIPRIL 1.25 MG PO CAPS: 1.2500 mg | ORAL_CAPSULE | Freq: Every day | ORAL | 1 refills | Status: DC

## 2018-09-10 ENCOUNTER — Encounter: Payer: Self-pay | Admitting: Family Medicine

## 2018-09-11 MED ORDER — RAMIPRIL 10 MG PO CAPS: 10.0000 mg | ORAL_CAPSULE | Freq: Every day | ORAL | 3 refills | Status: DC

## 2018-11-19 ENCOUNTER — Encounter: Payer: Self-pay | Admitting: Family Medicine

## 2018-11-20 MED ORDER — PRAVASTATIN SODIUM 80 MG PO TABS: 80.0000 mg | ORAL_TABLET | Freq: Every day | ORAL | 3 refills | Status: DC

## 2018-12-23 ENCOUNTER — Other Ambulatory Visit: Payer: Self-pay

## 2018-12-23 ENCOUNTER — Ambulatory Visit (INDEPENDENT_AMBULATORY_CARE_PROVIDER_SITE_OTHER): Payer: Managed Care, Other (non HMO) | Admitting: Family Medicine

## 2018-12-23 ENCOUNTER — Encounter: Payer: Self-pay | Admitting: Family Medicine

## 2018-12-23 VITALS — BP 127/72 | HR 93 | Temp 97.3°F | Ht 67.0 in | Wt 204.8 lb

## 2018-12-23 DIAGNOSIS — I10 Essential (primary) hypertension: Secondary | ICD-10-CM | POA: Diagnosis not present

## 2018-12-23 DIAGNOSIS — E1169 Type 2 diabetes mellitus with other specified complication: Secondary | ICD-10-CM | POA: Diagnosis not present

## 2018-12-23 DIAGNOSIS — Z0000 Encounter for general adult medical examination without abnormal findings: Secondary | ICD-10-CM

## 2018-12-23 DIAGNOSIS — I480 Paroxysmal atrial fibrillation: Secondary | ICD-10-CM

## 2018-12-23 DIAGNOSIS — K5909 Other constipation: Secondary | ICD-10-CM | POA: Diagnosis not present

## 2018-12-23 DIAGNOSIS — Z7901 Long term (current) use of anticoagulants: Secondary | ICD-10-CM

## 2018-12-23 DIAGNOSIS — K1120 Sialoadenitis, unspecified: Secondary | ICD-10-CM

## 2018-12-23 DIAGNOSIS — E785 Hyperlipidemia, unspecified: Secondary | ICD-10-CM

## 2018-12-23 NOTE — Patient Instructions (Signed)

## 2018-12-23 NOTE — Progress Notes (Signed)
Patient: Julia Stark, Female    DOB: [birth year], 58 y.o.   MRN: [id number] Visit Date: 12/23/2018  Today's Provider: Lavon Paganini, MD   Chief Complaint  Patient presents with  . Annual Exam   Subjective:    Annual physical exam Julia Stark is a 58 y.o. female who presents today for health maintenance and complete physical. She feels fairly well. She reports exercising walking. She reports she is sleeping fairly well.   Has seen ENT for salivary gland pain in hte past.  Will have intermittent pain and TTP in that area. Was told to f/u if recurred and CT scan would be considered  Constipation: Struggling with this her whole life.  BM every 1-5 days.  Will wait until it has been 4-5 days since last BM before she takes something for constipation.  [place] stool chart 1-2, rarely 3.  Takes Dulcolax and milk of magnesia intermittently ----------------------------------------------------------------- Last mammogram:06/14/2018 Last pap:01/18/2016  Review of Systems  Constitutional: Negative.   HENT: Negative.   Eyes: Negative.   Respiratory: Negative.   Cardiovascular: Negative.   Gastrointestinal: Negative.   Endocrine: Negative.   Genitourinary: Negative.   Musculoskeletal: Negative.   Skin: Negative.   Allergic/Immunologic: Negative.   Neurological: Negative.   Hematological: Negative.   Psychiatric/Behavioral: Negative.     Social History She  reports that she has never smoked. She has never used smokeless tobacco. She reports current alcohol use of about 4.0 standard drinks of alcohol per week. She reports that she does not use drugs. Social History   Socioeconomic History  . Marital status: Divorced    Spouse name: Not on file  . Number of children: 3  . Years of education: 75  . Highest education level: High school graduate  Occupational History    Employer: [employer]    Comment: in special [employer]  . Financial resource strain:  Not on file  . Food insecurity    Worry: Not on file    Inability: Not on file  . Transportation needs    Medical: Not on file    Non-medical: Not on file  Tobacco Use  . Smoking status: Never Smoker  . Smokeless tobacco: Never Used  Substance and Sexual Activity  . Alcohol use: Yes    Alcohol/week: 4.0 standard drinks    Types: 2 Glasses of wine, 2 Cans of beer per week  . Drug use: No  . Sexual activity: Yes    Partners: Male    Birth control/protection: Post-menopausal, Surgical  Lifestyle  . Physical activity    Days per week: Not on file    Minutes per session: Not on file  . Stress: Not on file  Relationships  . Social Herbalist on phone: Not on file    Gets together: Not on file    Attends religious service: Not on file    Active member of club or organization: Not on file    Attends meetings of clubs or organizations: Not on file    Relationship status: Not on file  Other Topics Concern  . Not on file  Social History Narrative  . Not on file    Patient Active Problem List   Diagnosis Date Noted  . Chronic anticoagulation 06/19/2018  . Affective disorder, major 08/22/2017  . Allergic rhinitis 08/22/2017  . Anxiety, generalized 08/22/2017  . T2DM (type 2 diabetes mellitus) ([place]) 08/22/2017  . Essential hypertension 08/22/2017  .  Hyperlipidemia 08/22/2017  . Mobitz type 2 second degree atrioventricular block 02/05/2017  . Paroxysmal A-fib ([place]) 05/25/2015  . Recurrent genital HSV (herpes simplex virus) infection 03/13/2014  . Chronic constipation 02/17/2014  . Reflux gastritis 02/17/2014  . Adenomatous polyp of descending colon 02/12/2012    Past Surgical History:  Procedure Laterality Date  . BASAL CELL CARCINOMA EXCISION    . CESAREAN SECTION  1999  . CHOLECYSTECTOMY    . COLONOSCOPY  02/12/2012  . COLONOSCOPY WITH PROPOFOL N/A 04/09/2017   Procedure: COLONOSCOPY WITH PROPOFOL;  Surgeon: Manya Silvas, MD;  Location[facility] ENDOSCOPY;   Service: Endoscopy;  Laterality: N/A;  . ELECTROPHYSIOLOGIC STUDY N/A 05/12/2015   Procedure: Cardioversion;  Surgeon: Corey Skains, MD;  Location: [facility] ORS;  Service: Cardiovascular;  Laterality: N/A;  . [place]    Family History  Family Status  Relation Name Status  . Mother  (Not Specified)  . Father  (Not Specified)  . Mat Uncle  (Not Specified)  . MGF  (Not Specified)  . PGM  (Not Specified)  . PGF  (Not Specified)  . Cousin  (Not Specified)  . Sister  Alive  . Brother  Alive  . Neg Hx  (Not Specified)   Her family history includes Alzheimer's disease in her father; Colon cancer in her maternal uncle; Depression in her mother and sister; Diabetes in her brother, mother, paternal grandmother, and sister; Heart disease in her paternal grandmother; Hyperlipidemia in her maternal grandfather; Hypertension in her maternal grandfather and mother; Ovarian cancer in her cousin; Prostate cancer in her paternal grandfather; Skin cancer in her mother.     Allergies  Allergen Reactions  . Rosuvastatin     muscle cramps    Previous Medications   ALPRAZOLAM (XANAX) 0.25 MG TABLET    Take 0.25 mg by mouth as needed.    APIXABAN (ELIQUIS) 5 MG TABS TABLET    Take 5 mg by mouth 2 (two) times daily.   BIOTIN 5 MG TABS    Take 5 mg by mouth daily.    DILTIAZEM (CARDIZEM) 90 MG TABLET    Take 1 tablet by mouth. Take 2-3 times per day PRN   FLECAINIDE (TAMBOCOR) 100 MG TABLET    Take 100 mg by mouth 2 (two) times daily.   MAGNESIUM OXIDE (MAG-OX) 400 MG TABLET    Take 400 mg by mouth daily.   MELATONIN 5 MG TABS    Take 5 mg by mouth.   METFORMIN (GLUCOPHAGE) 1000 MG TABLET    Take 1 tablet (1,000 mg total) by mouth daily.   MULTIPLE VITAMIN (MULTIVITAMIN) TABLET    Take 1 tablet by mouth daily.   POTASSIUM 99 MG TABS    Take by mouth.   PRAVASTATIN (PRAVACHOL) 80 MG TABLET    Take 1 tablet (80 mg total) by mouth daily.   PROBIOTIC PRODUCT (PROBIOTIC COLON SUPPORT PO)    Take  by mouth.   RAMIPRIL (ALTACE) 10 MG CAPSULE    Take 1 capsule (10 mg total) by mouth daily.   VALACYCLOVIR (VALTREX) 1000 MG TABLET    Take 1 tablet (1,000 mg total) by mouth daily. Take daily for 5 days as needed for outbreak   VENLAFAXINE XR (EFFEXOR-XR) 37.5 MG 24 HR CAPSULE    Take 1 capsule (37.5 mg total) by mouth daily with breakfast.   VITAMIN E 400 UNIT CAPSULE    Take 400 Units by mouth.     Patient Care Team:  Virginia Crews, MD as PCP - General (Family Medicine)      Objective:   Vitals: BP 127/72 (BP Location: Left Arm, Patient Position: Sitting, Cuff Size: Normal)   Pulse 93   Temp (!) 97.3 F (36.3 C) (Temporal)   Ht 5\' 7"  (1.702 m)   Wt 204 lb 12.8 oz (92.9 kg)   LMP 04/13/2011   SpO2 96%   BMI 32.08 kg/m    Physical Exam Vitals signs reviewed.  Constitutional:      General: [name] not in acute distress.    Appearance: Normal appearance. [name] well-developed. [name] not diaphoretic.  HENT:     Head: Normocephalic and atraumatic.     Right Ear: Tympanic membrane, ear canal and external ear normal.     Left Ear: Tympanic membrane, ear canal and external ear normal.     Nose: Nose normal.     Mouth/Throat:     Mouth: Mucous membranes are moist.     Pharynx: Oropharynx is clear. No oropharyngeal exudate.  Eyes:     General: No scleral icterus.    Conjunctiva/sclera: Conjunctivae normal.     Pupils: Pupils are equal, round, and reactive to light.  Neck:     Musculoskeletal: Neck supple.     Thyroid: No thyromegaly.     Comments: Swollen and tender L sided submandibular salivary gland Cardiovascular:     Rate and Rhythm: Normal rate and regular rhythm.     Pulses: Normal pulses.     Heart sounds: Normal heart sounds. No murmur.  Pulmonary:     Effort: Pulmonary effort is normal. No respiratory distress.     Breath sounds: Normal breath sounds. No wheezing or rales.  Abdominal:     General: Bowel sounds are normal. There is no distension.      Palpations: Abdomen is soft.     Tenderness: There is no abdominal tenderness. There is no guarding or rebound.  Musculoskeletal:        General: No deformity.     Right lower leg: No edema.     Left lower leg: No edema.  Lymphadenopathy:     Cervical: No cervical adenopathy.  Skin:    General: Skin is warm and dry.     Capillary Refill: Capillary refill takes less than 2 seconds.     Findings: No rash.  Neurological:     Mental Status: [name] alert and oriented to person, place, and time. Mental status is at baseline.  Psychiatric:        Mood and Affect: Mood normal.        Behavior: Behavior normal.        Thought Content: Thought content normal.      Depression Screen PHQ 2/9 Scores 11/22/2017 08/22/2017  PHQ - 2 Score 0 0  PHQ- 9 Score 2 3      Assessment & Plan:     Routine Health Maintenance and Physical Exam  Exercise Activities and Dietary recommendations Goals   None     Immunization History  Administered Date(s) Administered  . Influenza,inj,Quad PF,6+ Mos 01/17/2017  . Pneumococcal Polysaccharide-23 11/28/2011  . Tdap 03/13/2014    Health Maintenance  Topic Date Due  . OPHTHALMOLOGY EXAM  10/17/2017  . INFLUENZA VACCINE  11/02/2018  . FOOT EXAM  11/23/2018  . HEMOGLOBIN A1C  12/19/2018  . MAMMOGRAM  06/13/2020  . PAP SMEAR-Modifier  01/17/2021  . TETANUS/TDAP  03/13/2024  . COLONOSCOPY  04/10/2027  . PNEUMOCOCCAL POLYSACCHARIDE  VACCINE AGE 55-64 HIGH RISK  Completed  . Hepatitis C Screening  Completed  . HIV Screening  Completed     Discussed health benefits of physical activity, and encouraged her to engage in regular exercise appropriate for her age and condition.    --------------------------------------------------------------------  Problem List Items Addressed This Visit      Cardiovascular and Mediastinum   Essential hypertension    Well controlled Continue current medications Recheck metabolic panel F/u in 6 months        Relevant Orders   Lipid panel (Completed)   Comprehensive metabolic panel (Completed)   Paroxysmal A-fib ([place])    Followed by cardiology Doing well on current medications Rate and rhythm controlled today Continue Eliquis, flecainide, diltiazem as needed        Digestive   Chronic constipation    Longstanding problem Discussed need for increased fiber in her diet Discussed need for daily MiraLAX or other medication to keep her more regular and keep her bowel movements soft We will start with daily MiraLAX-start with 1 capful daily and titrate to 1 soft bowel movement daily Could consider prescription medication in the future if this is not helpful Up-to-date on colon cancer screening      Sialoadenitis    Recurrent issue Seems more chronically inflamed at this point No acute infection, so we will hold antibiotics Advised her to call ENT as they instructed her at her last visit as they may want to consider CT scan at this time        Endocrine   T2DM (type 2 diabetes mellitus) ([place])    Previously well controlled Continue ramipril for urine microalbumin Recheck A1c Continue metformin Continue pravastatin Up-to-date on vaccinations and screenings      Relevant Orders   Hemoglobin A1c (Completed)   Hyperlipidemia associated with type 2 diabetes mellitus ([place])    Also followed by cardiology Did not tolerate Crestor due to significant myalgias Tolerating pravastatin well Goal LDL less than 70 in the setting of diabetes Recheck lipid panel and CMP      Relevant Orders   Lipid panel (Completed)     Other   Chronic anticoagulation    On Eliquis for A. fib Recheck CBC      Relevant Orders   CBC w/Diff/Platelet (Completed)    Other Visit Diagnoses    Encounter for annual physical exam    -  Primary       Return in about 6 months (around 06/22/2019) for chronic disease f/u.   The entirety of the information documented in the History of Present Illness, Review  of Systems and Physical Exam were personally obtained by me. Portions of this information were initially documented by [facility], CMA and reviewed by me for thoroughness and accuracy.    Julia Stark, Dionne Bucy, MD MPH [facility]

## 2018-12-24 DIAGNOSIS — K1120 Sialoadenitis, unspecified: Secondary | ICD-10-CM

## 2018-12-24 HISTORY — DX: Sialoadenitis, unspecified: K11.20

## 2018-12-24 LAB — CBC WITH DIFFERENTIAL/PLATELET
Basophils Absolute: 0 10*3/uL (ref 0.0–0.2)
Basos: 0 %
EOS (ABSOLUTE): 0.1 10*3/uL (ref 0.0–0.4)
Eos: 2 %
Hematocrit: 39.3 % (ref 34.0–46.6)
Hemoglobin: 13.1 g/dL (ref 11.1–15.9)
Immature Grans (Abs): 0 10*3/uL (ref 0.0–0.1)
Immature Granulocytes: 0 %
Lymphocytes Absolute: 3.2 10*3/uL — ABNORMAL HIGH (ref 0.7–3.1)
Lymphs: 47 %
MCH: 30 pg (ref 26.6–33.0)
MCHC: 33.3 g/dL (ref 31.5–35.7)
MCV: 90 fL (ref 79–97)
Monocytes Absolute: 0.6 10*3/uL (ref 0.1–0.9)
Monocytes: 9 %
Neutrophils Absolute: 2.9 10*3/uL (ref 1.4–7.0)
Neutrophils: 42 %
Platelets: 193 10*3/uL (ref 150–450)
RBC: 4.36 x10E6/uL (ref 3.77–5.28)
RDW: 12.7 % (ref 11.7–15.4)
WBC: 6.9 10*3/uL (ref 3.4–10.8)

## 2018-12-24 LAB — COMPREHENSIVE METABOLIC PANEL
ALT: 25 IU/L (ref 0–32)
AST: 27 IU/L (ref 0–40)
Albumin/Globulin Ratio: 1.5 (ref 1.2–2.2)
Albumin: 4.6 g/dL (ref 3.8–4.9)
Alkaline Phosphatase: 81 IU/L (ref 39–117)
BUN/Creatinine Ratio: 18 (ref 9–23)
BUN: 11 mg/dL (ref 6–24)
Bilirubin Total: 0.4 mg/dL (ref 0.0–1.2)
CO2: 27 mmol/L (ref 20–29)
Calcium: 9.8 mg/dL (ref 8.7–10.2)
Chloride: 101 mmol/L (ref 96–106)
Creatinine, Ser: 0.61 mg/dL (ref 0.57–1.00)
GFR calc Af Amer: 116 mL/min/{1.73_m2} (ref >59)
GFR calc non Af Amer: 100 mL/min/{1.73_m2} (ref >59)
Globulin, Total: 3.1 g/dL (ref 1.5–4.5)
Glucose: 133 mg/dL — ABNORMAL HIGH (ref 65–99)
Potassium: 4 mmol/L (ref 3.5–5.2)
Sodium: 141 mmol/L (ref 134–144)
Total Protein: 7.7 g/dL (ref 6.0–8.5)

## 2018-12-24 LAB — HEMOGLOBIN A1C
Est. average glucose Bld gHb Est-mCnc: 140 mg/dL
Hgb A1c MFr Bld: 6.5 % — ABNORMAL HIGH (ref 4.8–5.6)

## 2018-12-24 LAB — LIPID PANEL
Chol/HDL Ratio: 3.3 ratio (ref 0.0–4.4)
Cholesterol, Total: 150 mg/dL (ref 100–199)
HDL: 45 mg/dL (ref >39)
LDL Chol Calc (NIH): 72 mg/dL (ref 0–99)
Triglycerides: 201 mg/dL — ABNORMAL HIGH (ref 0–149)
VLDL Cholesterol Cal: 33 mg/dL (ref 5–40)

## 2018-12-24 NOTE — Assessment & Plan Note (Signed)
Longstanding problem Discussed need for increased fiber in her diet Discussed need for daily MiraLAX or other medication to keep her more regular and keep her bowel movements soft We will start with daily MiraLAX-start with 1 capful daily and titrate to 1 soft bowel movement daily Could consider prescription medication in the future if this is not helpful Up-to-date on colon cancer screening

## 2018-12-24 NOTE — Assessment & Plan Note (Signed)
Followed by cardiology Doing well on current medications Rate and rhythm controlled today Continue Eliquis, flecainide, diltiazem as needed

## 2018-12-24 NOTE — Assessment & Plan Note (Signed)
Well controlled Continue current medications Recheck metabolic panel F/u in 6 months  

## 2018-12-24 NOTE — Assessment & Plan Note (Signed)
Previously well controlled Continue ramipril for urine microalbumin Recheck A1c Continue metformin Continue pravastatin Up-to-date on vaccinations and screenings

## 2018-12-24 NOTE — Assessment & Plan Note (Signed)
Recurrent issue Seems more chronically inflamed at this point No acute infection, so we will hold antibiotics Advised her to call ENT as they instructed her at her last visit as they may want to consider CT scan at this time

## 2018-12-24 NOTE — Assessment & Plan Note (Signed)
Also followed by cardiology Did not tolerate Crestor due to significant myalgias Tolerating pravastatin well Goal LDL less than 70 in the setting of diabetes Recheck lipid panel and CMP

## 2018-12-24 NOTE — Assessment & Plan Note (Signed)
On Eliquis for A. fib Recheck CBC 

## 2018-12-25 ENCOUNTER — Encounter: Payer: Self-pay | Admitting: Family Medicine

## 2019-02-20 LAB — HM DIABETES EYE EXAM

## 2019-02-21 ENCOUNTER — Encounter: Payer: Self-pay | Admitting: Family Medicine

## 2019-02-28 ENCOUNTER — Other Ambulatory Visit
Admission: RE | Admit: 2019-02-28 | Discharge: 2019-02-28 | Disposition: A | Discharge status: Home / Self Care | Payer: Managed Care, Other (non HMO) | Source: Ambulatory Visit | Attending: Internal Medicine | Admitting: Internal Medicine

## 2019-02-28 ENCOUNTER — Other Ambulatory Visit: Payer: Self-pay

## 2019-02-28 DIAGNOSIS — Z20828 Contact with and (suspected) exposure to other viral communicable diseases: Secondary | ICD-10-CM | POA: Insufficient documentation

## 2019-02-28 DIAGNOSIS — Z01812 Encounter for preprocedural laboratory examination: Secondary | ICD-10-CM | POA: Insufficient documentation

## 2019-02-28 LAB — SARS CORONAVIRUS 2 (TAT 6-24 HRS): SARS Coronavirus 2: NEGATIVE

## 2019-03-04 ENCOUNTER — Ambulatory Visit
Admission: RE | Admit: 2019-03-04 | Discharge: 2019-03-04 | Disposition: A | Discharge status: Home / Self Care | Payer: Managed Care, Other (non HMO) | Attending: Internal Medicine | Admitting: Internal Medicine

## 2019-03-04 ENCOUNTER — Other Ambulatory Visit: Payer: Self-pay

## 2019-03-04 ENCOUNTER — Ambulatory Visit: Payer: Managed Care, Other (non HMO) | Admitting: Anesthesiology

## 2019-03-04 ENCOUNTER — Encounter: Payer: Self-pay | Admitting: Anesthesiology

## 2019-03-04 ENCOUNTER — Encounter
Admission: RE | Disposition: A | Discharge status: Home / Self Care | Payer: Self-pay | Source: Home / Self Care | Attending: Internal Medicine

## 2019-03-04 DIAGNOSIS — I10 Essential (primary) hypertension: Secondary | ICD-10-CM | POA: Insufficient documentation

## 2019-03-04 DIAGNOSIS — I480 Paroxysmal atrial fibrillation: Secondary | ICD-10-CM | POA: Insufficient documentation

## 2019-03-04 DIAGNOSIS — Z79899 Other long term (current) drug therapy: Secondary | ICD-10-CM | POA: Diagnosis not present

## 2019-03-04 DIAGNOSIS — R0602 Shortness of breath: Secondary | ICD-10-CM | POA: Diagnosis not present

## 2019-03-04 DIAGNOSIS — E119 Type 2 diabetes mellitus without complications: Secondary | ICD-10-CM | POA: Diagnosis not present

## 2019-03-04 DIAGNOSIS — E785 Hyperlipidemia, unspecified: Secondary | ICD-10-CM | POA: Insufficient documentation

## 2019-03-04 DIAGNOSIS — Z7984 Long term (current) use of oral hypoglycemic drugs: Secondary | ICD-10-CM | POA: Insufficient documentation

## 2019-03-04 DIAGNOSIS — F329 Major depressive disorder, single episode, unspecified: Secondary | ICD-10-CM | POA: Diagnosis not present

## 2019-03-04 DIAGNOSIS — Z7901 Long term (current) use of anticoagulants: Secondary | ICD-10-CM | POA: Diagnosis not present

## 2019-03-04 HISTORY — PX: CARDIOVERSION: SHX1299

## 2019-03-04 LAB — GLUCOSE, CAPILLARY: Glucose-Capillary: 160 mg/dL — ABNORMAL HIGH (ref 70–99)

## 2019-03-04 SURGERY — CARDIOVERSION
Anesthesia: General

## 2019-03-04 MED ORDER — SODIUM CHLORIDE 0.9 % IV SOLN
INTRAVENOUS | Status: DC
  Administered 2019-03-04: 08:00:00 via INTRAVENOUS

## 2019-03-04 MED ORDER — PROPOFOL 10 MG/ML IV BOLUS
INTRAVENOUS | Status: AC
  Filled 2019-03-04: qty 20

## 2019-03-04 MED ORDER — SODIUM CHLORIDE (PF) 0.9 % IJ SOLN
INTRAMUSCULAR | Status: AC
  Filled 2019-03-04: qty 10

## 2019-03-04 MED ORDER — EPHEDRINE SULFATE 50 MG/ML IJ SOLN
INTRAMUSCULAR | Status: AC
  Filled 2019-03-04: qty 1

## 2019-03-04 MED ORDER — PROPOFOL 10 MG/ML IV BOLUS
INTRAVENOUS | Status: DC | PRN
  Administered 2019-03-04: 20 mg via INTRAVENOUS
  Administered 2019-03-04 (×2): 30 mg via INTRAVENOUS
  Administered 2019-03-04: 20 mg via INTRAVENOUS
  Administered 2019-03-04: 40 mg via INTRAVENOUS
  Administered 2019-03-04: 20 mg via INTRAVENOUS

## 2019-03-04 MED ORDER — FENTANYL CITRATE (PF) 100 MCG/2ML IJ SOLN: 25.0000 ug | INTRAMUSCULAR | Status: DC | PRN

## 2019-03-04 MED ORDER — LIDOCAINE HCL (PF) 2 % IJ SOLN
INTRAMUSCULAR | Status: AC
  Filled 2019-03-04: qty 10

## 2019-03-04 MED ORDER — PHENYLEPHRINE HCL (PRESSORS) 10 MG/ML IV SOLN
INTRAVENOUS | Status: AC
  Filled 2019-03-04: qty 1

## 2019-03-04 MED ORDER — LIDOCAINE HCL (CARDIAC) PF 100 MG/5ML IV SOSY
PREFILLED_SYRINGE | INTRAVENOUS | Status: DC | PRN
  Administered 2019-03-04: 60 mg via INTRATRACHEAL

## 2019-03-04 MED ORDER — ONDANSETRON HCL 4 MG/2ML IJ SOLN: 4.0000 mg | Freq: Once | INTRAMUSCULAR | Status: DC | PRN

## 2019-03-04 MED ORDER — SUCCINYLCHOLINE CHLORIDE 20 MG/ML IJ SOLN
INTRAMUSCULAR | Status: AC
  Filled 2019-03-04: qty 1

## 2019-03-04 NOTE — Discharge Instructions (Signed)
°  Call Dr Alma Friendly office for follow up appointment in one week  Electrical Cardioversion, Care After This sheet gives you information about how to care for yourself after your procedure. Your health care provider may also give you more specific instructions. If you have problems or questions, contact your health care provider. What can I expect after the procedure? After the procedure, it is common to have:  Some redness on the skin where the shocks were given. Follow these instructions at home:   Do not drive for 24 hours if you were given a medicine to help you relax (sedative).  Take over-the-counter and prescription medicines only as told by your health care provider.  Ask your health care provider how to check your pulse. Check it often.  Rest for 48 hours after the procedure or as told by your health care provider.  Avoid or limit your caffeine use as told by your health care provider. Contact a health care provider if:  You feel like your heart is beating too quickly or your pulse is not regular.  You have a serious muscle cramp that does not go away. Get help right away if:   You have discomfort in your chest.  You are dizzy or you feel faint.  You have trouble breathing or you are short of breath.  Your speech is slurred.  You have trouble moving an arm or leg on one side of your body.  Your fingers or toes turn cold or blue. This information is not intended to replace advice given to you by your health care provider. Make sure you discuss any questions you have with your health care provider. Document Released: 01/08/2013 Document Revised: 03/02/2017 Document Reviewed: 09/24/2015 Elsevier Patient Education  2020 [employer].

## 2019-03-04 NOTE — Transfer of Care (Addendum)
Immediate Anesthesia Transfer of Care Note  Patient: Julia Stark  Procedure(s) Performed: CARDIOVERSION (N/A )  Patient Location: Special Procedures  Anesthesia Type:General  Level of Consciousness: alert , oriented and patient cooperative  Airway & Oxygen Therapy: Patient Spontanous Breathing and Patient connected to nasal cannula oxygen  Post-op Assessment: Report given to RN, Post -op Vital signs reviewed and stable and Patient moving all extremities X 4  Post vital signs: Reviewed and stable  Last Vitals:  Vitals Value Taken Time  BP 94/52 03/04/19 0752  Temp    Pulse 57 03/04/19 0753  Resp 19 03/04/19 0753  SpO2 98 % 03/04/19 0753    Last Pain:  Vitals:   03/04/19 0720  TempSrc: Oral  PainSc: 0-No pain         Complications: No apparent anesthesia complications

## 2019-03-04 NOTE — Anesthesia Preprocedure Evaluation (Signed)
Anesthesia Evaluation  Patient identified by MRN, date of birth, ID band Patient awake    Reviewed: Allergy & Precautions, H&P , NPO status , Patient's Chart, lab work & pertinent test results, reviewed documented beta blocker date and time   History of Anesthesia Complications Negative for: history of anesthetic complications  Airway Mallampati: I  TM Distance: >3 FB Neck ROM: full    Dental no notable dental hx. (+) Teeth Intact   Pulmonary neg pulmonary ROS,    Pulmonary exam normal breath sounds clear to auscultation       Cardiovascular Exercise Tolerance: Good hypertension, (-) angina(-) CAD, (-) Past MI, (-) Cardiac Stents and (-) CABG Normal cardiovascular exam+ dysrhythmias Atrial Fibrillation (-) Valvular Problems/Murmurs Rhythm:regular Rate:Normal     Neuro/Psych PSYCHIATRIC DISORDERS (Depression) Anxiety Depression negative neurological ROS     GI/Hepatic negative GI ROS, Neg liver ROS,   Endo/Other  negative endocrine ROSdiabetes  Renal/GU negative Renal ROS  negative genitourinary   Musculoskeletal  (+) Arthritis , Osteoarthritis,    Abdominal   Peds negative pediatric ROS (+)  Hematology negative hematology ROS (+)   Anesthesia Other Findings Past Medical History:   Dysrhythmia                                                  Diabetes mellitus without complication (HCC)                 Depression                                                   Reproductive/Obstetrics negative OB ROS                             Anesthesia Physical  Anesthesia Plan  ASA: III  Anesthesia Plan: General   Post-op Pain Management:    Induction: Intravenous  PONV Risk Score and Plan: Propofol infusion  Airway Management Planned: Nasal Cannula  Additional Equipment:   Intra-op Plan:   Post-operative Plan:   Informed Consent: I have reviewed the patients History and Physical,  chart, labs and discussed the procedure including the risks, benefits and alternatives for the proposed anesthesia with the patient or authorized representative who has indicated his/her understanding and acceptance.     Dental Advisory Given  Plan Discussed with: Anesthesiologist, CRNA and Surgeon  Anesthesia Plan Comments:         Anesthesia Quick Evaluation

## 2019-03-04 NOTE — Anesthesia Postprocedure Evaluation (Signed)
Anesthesia Post Note  Patient: Julia Stark  Procedure(s) Performed: CARDIOVERSION (N/A )  Patient location during evaluation: Specials Recovery Anesthesia Type: General Level of consciousness: awake and alert and oriented Pain management: pain level controlled Vital Signs Assessment: post-procedure vital signs reviewed and stable Respiratory status: spontaneous breathing Cardiovascular status: blood pressure returned to baseline Anesthetic complications: no     Last Vitals:  Vitals:   03/04/19 0753 03/04/19 0800  BP:  (!) 96/57  Pulse: (!) 57 (!) 55  Resp: 19 16  Temp:    SpO2: 98% 95%    Last Pain:  Vitals:   03/04/19 0720  TempSrc: Oral  PainSc: 0-No pain                 Ariabella Brien

## 2019-03-04 NOTE — Anesthesia Post-op Follow-up Note (Signed)
Anesthesia QCDR form completed.        

## 2019-03-04 NOTE — CV Procedure (Signed)
Electrical Cardioversion Procedure Note Julia Stark [id number] Mar 27, 1961  Procedure: Electrical Cardioversion Indications:  Paroxysmal non valvular atrial fibrillation  Procedure Details Consent: Risks of procedure as well as the alternatives and risks of each were explained to the (patient/caregiver).  Consent for procedure obtained. Time Out: Verified patient identification, verified procedure, site/side was marked, verified correct patient position, special equipment/implants available, medications/allergies/relevent history reviewed, required imaging and test results available.  Performed  Patient placed on cardiac monitor, pulse oximetry, supplemental oxygen as necessary.  Sedation given: Propofol and versed as per anesthesia  Pacer pads placed anterior and posterior chest.  Cardioverted 1 time(s).  Cardioverted at 120J.  Evaluation Findings: Post procedure EKG shows: NSR Complications: None Patient did tolerate procedure well.   Julia Stark M.D. [facility] 03/04/2019, 7:48 AM

## 2019-03-05 ENCOUNTER — Encounter: Payer: Self-pay | Admitting: Internal Medicine

## 2019-03-07 ENCOUNTER — Other Ambulatory Visit: Payer: Self-pay

## 2019-03-07 DIAGNOSIS — U071 COVID-19: Secondary | ICD-10-CM

## 2019-03-08 LAB — NOVEL CORONAVIRUS, NAA: SARS-CoV-2, NAA: NOT DETECTED

## 2019-03-23 ENCOUNTER — Other Ambulatory Visit: Payer: Self-pay | Admitting: Family Medicine

## 2019-04-07 ENCOUNTER — Other Ambulatory Visit: Payer: Self-pay | Admitting: Family Medicine

## 2019-04-07 MED ORDER — METFORMIN HCL 1000 MG PO TABS: 1000.0000 mg | ORAL_TABLET | Freq: Every day | ORAL | 3 refills | Status: DC

## 2019-04-07 NOTE — Telephone Encounter (Signed)
[facility] faxed refill request for the following medications:   metFORMIN (GLUCOPHAGE) 1000 MG tablet     Please advise.  

## 2019-04-14 ENCOUNTER — Telehealth: Payer: Self-pay | Admitting: Family Medicine

## 2019-04-14 NOTE — Telephone Encounter (Signed)
[facility] faxed refill request for the following medications: ° °valACYclovir (VALTREX) 1000 MG tablet  ° °Please advise. ° °

## 2019-04-15 MED ORDER — VALACYCLOVIR HCL 1 G PO TABS: 1000.0000 mg | ORAL_TABLET | Freq: Every day | ORAL | 3 refills | Status: DC

## 2019-04-15 NOTE — Telephone Encounter (Signed)
rx sent

## 2019-05-05 HISTORY — PX: CARDIAC ELECTROPHYSIOLOGY MAPPING AND ABLATION: SHX1292

## 2019-05-22 DIAGNOSIS — R768 Other specified abnormal immunological findings in serum: Secondary | ICD-10-CM | POA: Insufficient documentation

## 2019-06-09 ENCOUNTER — Other Ambulatory Visit
Admission: RE | Admit: 2019-06-09 | Discharge: 2019-06-09 | Disposition: A | Discharge status: Home / Self Care | Payer: Managed Care, Other (non HMO) | Source: Ambulatory Visit | Attending: Internal Medicine | Admitting: Internal Medicine

## 2019-06-09 DIAGNOSIS — Z20822 Contact with and (suspected) exposure to covid-19: Secondary | ICD-10-CM | POA: Insufficient documentation

## 2019-06-09 DIAGNOSIS — Z01812 Encounter for preprocedural laboratory examination: Secondary | ICD-10-CM | POA: Insufficient documentation

## 2019-06-10 LAB — SARS CORONAVIRUS 2 (TAT 6-24 HRS): SARS Coronavirus 2: NEGATIVE

## 2019-06-11 ENCOUNTER — Ambulatory Visit
Admission: RE | Admit: 2019-06-11 | Discharge: 2019-06-11 | Disposition: A | Discharge status: Home / Self Care | Payer: Managed Care, Other (non HMO) | Attending: Internal Medicine | Admitting: Internal Medicine

## 2019-06-11 ENCOUNTER — Ambulatory Visit: Payer: Managed Care, Other (non HMO) | Admitting: Certified Registered"

## 2019-06-11 ENCOUNTER — Encounter: Payer: Self-pay | Admitting: Internal Medicine

## 2019-06-11 ENCOUNTER — Encounter
Admission: RE | Disposition: A | Discharge status: Home / Self Care | Payer: Self-pay | Source: Home / Self Care | Attending: Internal Medicine

## 2019-06-11 ENCOUNTER — Other Ambulatory Visit: Payer: Self-pay

## 2019-06-11 DIAGNOSIS — Z79899 Other long term (current) drug therapy: Secondary | ICD-10-CM | POA: Insufficient documentation

## 2019-06-11 DIAGNOSIS — E785 Hyperlipidemia, unspecified: Secondary | ICD-10-CM | POA: Diagnosis not present

## 2019-06-11 DIAGNOSIS — Z7901 Long term (current) use of anticoagulants: Secondary | ICD-10-CM | POA: Diagnosis not present

## 2019-06-11 DIAGNOSIS — E119 Type 2 diabetes mellitus without complications: Secondary | ICD-10-CM | POA: Insufficient documentation

## 2019-06-11 DIAGNOSIS — I495 Sick sinus syndrome: Secondary | ICD-10-CM | POA: Insufficient documentation

## 2019-06-11 DIAGNOSIS — I480 Paroxysmal atrial fibrillation: Secondary | ICD-10-CM | POA: Insufficient documentation

## 2019-06-11 DIAGNOSIS — Z538 Procedure and treatment not carried out for other reasons: Secondary | ICD-10-CM | POA: Diagnosis not present

## 2019-06-11 DIAGNOSIS — F329 Major depressive disorder, single episode, unspecified: Secondary | ICD-10-CM | POA: Insufficient documentation

## 2019-06-11 DIAGNOSIS — Z7984 Long term (current) use of oral hypoglycemic drugs: Secondary | ICD-10-CM | POA: Diagnosis not present

## 2019-06-11 DIAGNOSIS — I10 Essential (primary) hypertension: Secondary | ICD-10-CM | POA: Diagnosis not present

## 2019-06-11 HISTORY — PX: CARDIOVERSION: SHX1299

## 2019-06-11 LAB — GLUCOSE, CAPILLARY: Glucose-Capillary: 132 mg/dL — ABNORMAL HIGH (ref 70–99)

## 2019-06-11 SURGERY — CARDIOVERSION
Anesthesia: General

## 2019-06-11 MED ORDER — SODIUM CHLORIDE 0.9 % IV SOLN: INTRAVENOUS | Status: DC

## 2019-06-11 MED ORDER — PROPOFOL 10 MG/ML IV BOLUS
INTRAVENOUS | Status: AC
  Filled 2019-06-11: qty 20

## 2019-06-11 NOTE — Progress Notes (Signed)
Dr. Nehemiah Massed at bedside. Pt rhythm SR with dropped P waves at times, rate 50-60. Per MD No cardioversion needed today. Per MD D/C Cardizem and Pt to schedule appointment this Friday with Dr. Nehemiah Massed for EKG. IV removed, Pt d/c per MD

## 2019-06-11 NOTE — Anesthesia Preprocedure Evaluation (Signed)
Anesthesia Evaluation  Patient identified by MRN, date of birth, ID band Patient awake    Reviewed: Allergy & Precautions, H&P , NPO status , Patient's Chart, lab work & pertinent test results, reviewed documented beta blocker date and time   Airway Mallampati: II   Neck ROM: full    Dental  (+) Teeth Intact   Pulmonary neg pulmonary ROS,    Pulmonary exam normal        Cardiovascular Exercise Tolerance: Good hypertension, On Medications negative cardio ROS Normal cardiovascular exam+ dysrhythmias  Rhythm:regular Rate:Normal     Neuro/Psych PSYCHIATRIC DISORDERS Anxiety Depression negative neurological ROS  negative psych ROS   GI/Hepatic negative GI ROS, Neg liver ROS,   Endo/Other  negative endocrine ROSdiabetes, Well Controlled, Type 2, Oral Hypoglycemic Agents  Renal/GU negative Renal ROS  negative genitourinary   Musculoskeletal   Abdominal   Peds  Hematology negative hematology ROS (+)   Anesthesia Other Findings Past Medical History: No date: Anxiety No date: Arthritis     Comment:  bilateral shoulders No date: Atrial fibrillation (HCC) No date: Cancer ([place])     Comment:  BASAL CELL CARCINOMA OF FACE No date: Depression No date: Diabetes mellitus without complication ([place]) No date: Dysrhythmia     Comment:  A fib No date: Hyperlipidemia No date: Hypertension Past Surgical History: No date: BASAL CELL CARCINOMA EXCISION 03/04/2019: CARDIOVERSION; N/A     Comment:  Procedure: CARDIOVERSION;  Surgeon: Corey Skains,               MD;  Location: [facility] ORS;  Service: Cardiovascular;                Laterality: N/A; 1999: CESAREAN SECTION No date: CHOLECYSTECTOMY 02/12/2012: COLONOSCOPY 04/09/2017: COLONOSCOPY WITH PROPOFOL; N/A     Comment:  Procedure: COLONOSCOPY WITH PROPOFOL;  Surgeon: Manya Silvas, MD;  Location: [facility] ENDOSCOPY;  Service:               Endoscopy;  Laterality:  N/A; 05/12/2015: ELECTROPHYSIOLOGIC STUDY; N/A     Comment:  Procedure: Cardioversion;  Surgeon: Corey Skains,               MD;  Location: [facility] ORS;  Service: Cardiovascular;                Laterality: N/A; 1999: TUBAL LIGATION BMI    Body Mass Index: 30.56 kg/m     Reproductive/Obstetrics negative OB ROS                             Anesthesia Physical Anesthesia Plan  ASA: III  Anesthesia Plan: General   Post-op Pain Management:    Induction:   PONV Risk Score and Plan:   Airway Management Planned:   Additional Equipment:   Intra-op Plan:   Post-operative Plan:   Informed Consent: I have reviewed the patients History and Physical, chart, labs and discussed the procedure including the risks, benefits and alternatives for the proposed anesthesia with the patient or authorized representative who has indicated his/her understanding and acceptance.     Dental Advisory Given  Plan Discussed with: CRNA  Anesthesia Plan Comments:         Anesthesia Quick Evaluation

## 2019-06-13 ENCOUNTER — Encounter: Payer: Self-pay | Admitting: Registered Nurse

## 2019-06-23 ENCOUNTER — Ambulatory Visit (INDEPENDENT_AMBULATORY_CARE_PROVIDER_SITE_OTHER): Payer: Managed Care, Other (non HMO) | Admitting: Family Medicine

## 2019-06-23 ENCOUNTER — Other Ambulatory Visit: Payer: Self-pay

## 2019-06-23 ENCOUNTER — Encounter: Payer: Self-pay | Admitting: Family Medicine

## 2019-06-23 VITALS — BP 116/74 | HR 70 | Temp 97.3°F | Wt 200.0 lb

## 2019-06-23 DIAGNOSIS — I480 Paroxysmal atrial fibrillation: Secondary | ICD-10-CM | POA: Diagnosis not present

## 2019-06-23 DIAGNOSIS — E1169 Type 2 diabetes mellitus with other specified complication: Secondary | ICD-10-CM

## 2019-06-23 DIAGNOSIS — E785 Hyperlipidemia, unspecified: Secondary | ICD-10-CM

## 2019-06-23 DIAGNOSIS — I10 Essential (primary) hypertension: Secondary | ICD-10-CM | POA: Diagnosis not present

## 2019-06-23 DIAGNOSIS — Z1231 Encounter for screening mammogram for malignant neoplasm of breast: Secondary | ICD-10-CM | POA: Diagnosis not present

## 2019-06-23 DIAGNOSIS — F5101 Primary insomnia: Secondary | ICD-10-CM

## 2019-06-23 LAB — POCT GLYCOSYLATED HEMOGLOBIN (HGB A1C): Hemoglobin A1C: 6.3 % — AB (ref 4.0–5.6)

## 2019-06-23 MED ORDER — TRAZODONE HCL 50 MG PO TABS: 25.0000 mg | ORAL_TABLET | Freq: Every evening | ORAL | 3 refills | Status: DC | PRN

## 2019-06-23 NOTE — Assessment & Plan Note (Signed)
Well controlled with A1c 6.3 Continue current medications UTD on vaccines, eye exam  foot exam today On ACEi, On Statin Discussed diet and exercise F/u in 6 months

## 2019-06-23 NOTE — Assessment & Plan Note (Signed)
Followed by Cardiology S/p ablation and cardioversion Continue eliquis, amiodarone Rate and rhythm controlled

## 2019-06-23 NOTE — Assessment & Plan Note (Signed)
Well controlled Continue current medications Recheck metabolic panel F/u in 6 months  

## 2019-06-23 NOTE — Progress Notes (Signed)
Patient: Julia Stark Female    DOB: October 09, 1960   59 y.o.   MRN: [id number] Visit Date: 06/23/2019  Today's Provider: Lavon Paganini, MD   Chief Complaint  Patient presents with  . Hypertension  . Hyperlipidemia  . Diabetes   Subjective:     HPI     Diabetes Mellitus Type II, Follow-up:   Lab Results  Component Value Date   HGBA1C 6.3 (A) 06/23/2019   HGBA1C 6.5 (H) 12/23/2018   HGBA1C 6.3 (H) 06/18/2018   Last seen for diabetes 6 months ago.  Management since then includes no changes. She reports excellent compliance with treatment. [name] not having side effects.  Current symptoms include none and have been stable. Home blood sugar records: Not being checked.  Episodes of hypoglycemia? no   Current Insulin Regimen: None Most Recent Eye Exam: 02/20/2019 Weight trend: stable Current diet: in general, a "healthy" diet   Current exercise: walking  ------------------------------------------------------------------------   Hypertension, follow-up:  BP Readings from Last 3 Encounters:  06/23/19 116/74  06/11/19 (!) 108/53  03/04/19 (!) 91/51    She was last seen for hypertension 6 months ago.  BP at that visit was 127/72. Management since that visit includes no changes She reports excellent compliance with treatment. [name] not having side effects.  [name] exercising. [name] adherent to low salt diet.   Outside blood pressures are not being checked. [name] experiencing none.  Patient denies chest pain, fatigue, lower extremity edema and syncope.   Cardiovascular risk factors include diabetes mellitus and hypertension.  Use of agents associated with hypertension: none.   ------------------------------------------------------------------------    Lipid/Cholesterol, Follow-up:   Last seen for this 6 months ago.  Management since that visit includes no changes.  Last Lipid Panel:    Component Value Date/Time   CHOL 150 12/23/2018 1608    CHOL 193 10/18/2012 0712   TRIG 201 (H) 12/23/2018 1608   TRIG 215 (H) 10/18/2012 0712   HDL 45 12/23/2018 1608   HDL 37 (L) 10/18/2012 0712   CHOLHDL 3.3 12/23/2018 1608   VLDL 43 (H) 10/18/2012 0712   LDLCALC 72 12/23/2018 1608   LDLCALC 113 (H) 10/18/2012 [id number]    She reports excellent compliance with treatment. [name] not having side effects.   Wt Readings from Last 3 Encounters:  06/23/19 200 lb (90.7 kg)  06/11/19 201 lb (91.2 kg)  03/04/19 202 lb (91.6 kg)    ------------------------------------------------------------------------ Taking melatonin every night to fall asleep with reading a book.  Wakes up after ~4 hrs of sleep.  Sometimes has nocturia.  Takes hours to fall back to sleep.   Allergies  Allergen Reactions  . Rosuvastatin     muscle cramps     Current Outpatient Medications:  .  ALPRAZolam (XANAX) 0.25 MG tablet, Take 0.25 mg by mouth daily as needed for anxiety or sleep. , Disp: , Rfl:  .  amiodarone (PACERONE) 200 MG tablet, Take 200 mg by mouth daily. , Disp: , Rfl:  .  apixaban (ELIQUIS) 5 MG TABS tablet, Take 5 mg by mouth 2 (two) times daily., Disp: , Rfl:  .  Melatonin 5 MG TABS, Take 5 mg by mouth at bedtime. , Disp: , Rfl:  .  metFORMIN (GLUCOPHAGE) 1000 MG tablet, Take 1 tablet (1,000 mg total) by mouth daily. (Patient taking differently: Take 1,000 mg by mouth at bedtime. ), Disp: 90 tablet, Rfl: 3 .  Multiple Vitamin (MULTIVITAMIN)  tablet, Take 1 tablet by mouth at bedtime. One-A-Day Women's 50+, Disp: , Rfl:  .  Potassium 99 MG TABS, Take 99 mg by mouth at bedtime. , Disp: , Rfl:  .  pravastatin (PRAVACHOL) 80 MG tablet, Take 1 tablet (80 mg total) by mouth daily. (Patient taking differently: Take 80 mg by mouth at bedtime. ), Disp: 90 tablet, Rfl: 3 .  ramipril (ALTACE) 10 MG capsule, Take 1 capsule (10 mg total) by mouth daily., Disp: 90 capsule, Rfl: 3 .  valACYclovir (VALTREX) 1000 MG tablet, Take 1 tablet (1,000 mg total) by mouth daily.  Take daily for 5 days as needed for outbreak, Disp: 5 tablet, Rfl: 3 .  venlafaxine XR (EFFEXOR-XR) 37.5 MG 24 hr capsule, TAKE 1 CAPSULE(37.5 MG) BY MOUTH DAILY WITH BREAKFAST (Patient taking differently: Take 37.5 mg by mouth daily with breakfast. ), Disp: 90 capsule, Rfl: 3 .  vitamin E 400 UNIT capsule, Take 800 Units by mouth daily. , Disp: , Rfl:  .  traZODone (DESYREL) 50 MG tablet, Take 0.5-1 tablets (25-50 mg total) by mouth at bedtime as needed for sleep., Disp: 30 tablet, Rfl: 3  Review of Systems  Constitutional: Negative.   Respiratory: Negative.   Cardiovascular: Negative.   Gastrointestinal: Negative.   Neurological: Negative for dizziness, light-headedness and headaches.    Social History   Tobacco Use  . Smoking status: Never Smoker  . Smokeless tobacco: Never Used  Substance Use Topics  . Alcohol use: Yes    Alcohol/week: 4.0 standard drinks    Types: 2 Glasses of wine, 2 Cans of beer per week      Objective:   BP 116/74 (BP Location: Left Arm, Patient Position: Sitting, Cuff Size: Large)   Pulse 70   Temp (!) 97.3 F (36.3 C) (Temporal)   Wt 200 lb (90.7 kg)   LMP 04/13/2011   BMI 30.41 kg/m  Vitals:   06/23/19 1605  BP: 116/74  Pulse: 70  Temp: (!) 97.3 F (36.3 C)  TempSrc: Temporal  Weight: 200 lb (90.7 kg)  Body mass index is 30.41 kg/m.   Physical Exam Vitals reviewed.  Constitutional:      General: [name] not in acute distress.    Appearance: Normal appearance. [name] well-developed. [name] not diaphoretic.  HENT:     Head: Normocephalic and atraumatic.  Eyes:     General: No scleral icterus.    Conjunctiva/sclera: Conjunctivae normal.  Neck:     Thyroid: No thyromegaly.  Cardiovascular:     Rate and Rhythm: Normal rate and regular rhythm.     Heart sounds: Normal heart sounds. No murmur.  Pulmonary:     Effort: Pulmonary effort is normal. No respiratory distress.     Breath sounds: Normal breath sounds. No wheezing, rhonchi or  rales.  Musculoskeletal:     Cervical back: Neck supple.     Right lower leg: No edema.     Left lower leg: No edema.  Lymphadenopathy:     Cervical: No cervical adenopathy.  Skin:    General: Skin is warm and dry.     Findings: No rash.  Neurological:     Mental Status: [name] alert and oriented to person, place, and time. Mental status is at baseline.  Psychiatric:        Mood and Affect: Mood normal.        Behavior: Behavior normal.      Results for orders placed or performed in visit on 06/23/19  POCT glycosylated hemoglobin (Hb A1C)  Result Value Ref Range   Hemoglobin A1C 6.3 (A) 4.0 - 5.6 %       Assessment & Plan    Problem List Items Addressed This Visit      Cardiovascular and Mediastinum   Essential hypertension - Primary    Well controlled Continue current medications Recheck metabolic panel F/u in 6 months       Paroxysmal A-fib ([place])    Followed by Cardiology S/p ablation and cardioversion Continue eliquis, amiodarone Rate and rhythm controlled        Endocrine   T2DM (type 2 diabetes mellitus) ([place])    Well controlled with A1c 6.3 Continue current medications UTD on vaccines, eye exam  foot exam today On ACEi, On Statin Discussed diet and exercise F/u in 6 months       Relevant Orders   POCT glycosylated hemoglobin (Hb A1C) (Completed)   Hyperlipidemia associated with type 2 diabetes mellitus ([place])    Also followed by Cardiology Did not tolerate Crestor due to significant myalgias Tolerating pravastatin well Goal LDL <70 in setting of diabetes Recheck FLP      Relevant Orders   Lipid panel     Other   Primary insomnia    Trial of trazodone prn Encouraged sleep hygiene        Other Visit Diagnoses    Breast cancer screening by mammogram       Relevant Orders   MM 3D SCREEN BREAST BILATERAL       Return in about 6 months (around 12/24/2019) for CPE.   The entirety of the information documented in the History of  Present Illness, Review of Systems and Physical Exam were personally obtained by me. Portions of this information were initially documented by Ashley Royalty, CMA and reviewed by me for thoroughness and accuracy.    , Dionne Bucy, MD MPH [facility]

## 2019-06-23 NOTE — Assessment & Plan Note (Addendum)
Also followed by Cardiology Did not tolerate Crestor due to significant myalgias Tolerating pravastatin well Goal LDL <70 in setting of diabetes Recheck FLP

## 2019-06-23 NOTE — Assessment & Plan Note (Signed)
Trial of trazodone prn Encouraged sleep hygiene

## 2019-07-02 ENCOUNTER — Telehealth: Payer: Self-pay

## 2019-07-02 LAB — LIPID PANEL
Chol/HDL Ratio: 3.2 ratio (ref 0.0–4.4)
Cholesterol, Total: 151 mg/dL (ref 100–199)
HDL: 47 mg/dL (ref >39)
LDL Chol Calc (NIH): 78 mg/dL (ref 0–99)
Triglycerides: 153 mg/dL — ABNORMAL HIGH (ref 0–149)
VLDL Cholesterol Cal: 26 mg/dL (ref 5–40)

## 2019-07-02 NOTE — Telephone Encounter (Signed)
LMTCB, PEC may give results. 

## 2019-07-02 NOTE — Telephone Encounter (Signed)
Attempted to notify pt regarding the lab message. No answer, left VM for patient to call back.

## 2019-07-02 NOTE — Telephone Encounter (Signed)
-----   Message from Virginia Crews, MD sent at 07/02/2019 10:11 AM EDT ----- Cholesterol is not quite to goal in the setting of diabetes.  If she has been taking her pravastatin regularly, I would recommend switching to atorvastatin 40 mg daily instead.  I know that she did not tolerate Crestor in the past, but atorvastatin would be a reasonable alternative.

## 2019-07-08 MED ORDER — ATORVASTATIN CALCIUM 40 MG PO TABS: 40.0000 mg | ORAL_TABLET | Freq: Every day | ORAL | 1 refills | Status: DC

## 2019-07-08 NOTE — Telephone Encounter (Signed)
Pt advised.  [name] taking pravastatin regularly and agreed to switch to Atorvastatin.  RX sent to [facility].   Thanks,   -Mickel Baas

## 2019-07-14 ENCOUNTER — Ambulatory Visit (INDEPENDENT_AMBULATORY_CARE_PROVIDER_SITE_OTHER): Payer: Managed Care, Other (non HMO) | Admitting: Dermatology

## 2019-07-14 ENCOUNTER — Other Ambulatory Visit: Payer: Self-pay

## 2019-07-14 DIAGNOSIS — L559 Sunburn, unspecified: Secondary | ICD-10-CM

## 2019-07-14 DIAGNOSIS — L578 Other skin changes due to chronic exposure to nonionizing radiation: Secondary | ICD-10-CM | POA: Diagnosis not present

## 2019-07-14 DIAGNOSIS — L568 Other specified acute skin changes due to ultraviolet radiation: Secondary | ICD-10-CM | POA: Diagnosis not present

## 2019-07-14 MED ORDER — TRIAMCINOLONE ACETONIDE 0.147 MG/GM EX AERS: INHALATION_SPRAY | CUTANEOUS | 0 refills | Status: DC

## 2019-07-14 NOTE — Progress Notes (Signed)
   Follow-Up Visit   Subjective  Julia Stark is a 59 y.o. female who presents for the following: Rash (that patient noticed this morning which is very itchy on the arms and hands. Patient did have alot of sun exposure every day this weekend even though she used sunscreen. No changes in body wash products, lotions, shampoos, soaps, or laundry detergents. Patient did have a change in cholesterol medication, but she hasn't started it yet.). Patient did start Amiodarone in December 2020 for cardiac issues she was having, but does not plan to be on the medication for an extended period of time.   The following portions of the chart were reviewed this encounter and updated as appropriate: Tobacco  Allergies  Meds  Problems  Med Hx  Surg Hx  Fam Hx      Review of Systems: No other skin or systemic complaints.  Objective  Well appearing patient in no apparent distress; mood and affect are within normal limits.  A focused examination was performed including B/L arms. Relevant physical exam findings are noted in the Assessment and Plan.  Objective  B/L arms: Diffuse scaly erythematous macules with underlying dyspigmentation.   Objective  B/L arms: Erythema and erythematous papules.  Assessment & Plan    Actinic skin damage B/L arms  Recommend daily broad spectrum sunscreen SPF 30+ to sun-exposed areas, reapply every 2 hours as needed. Call for new or changing lesions.   Photodermatitis from sunburn and photosensitivity to Amiodarone.  Patient has very light skin also. B/L arms  With sunburn and photo sensitivity to Amiodarone- patient has never had a rash after sun exposure before. Not related to ACE inhibitor but likely related to new medication Amiodarone that she started in December 2020. Start Kenalog spray QD-BID PRN itchy rash. Avoid f/g/a.    triamcinolone (KENALOG) 0.147 MG/GM topical spray - B/L arms  Return for TBSE patient plans to schedule at checkout today.   Luther Redo, CMA, am acting as scribe for Sarina Ser, MD . Documentation: I have reviewed the above documentation for accuracy and completeness, and I agree with the above.  Sarina Ser, MD

## 2019-07-15 ENCOUNTER — Encounter: Payer: Self-pay | Admitting: Dermatology

## 2019-07-23 ENCOUNTER — Telehealth: Payer: Self-pay

## 2019-07-23 NOTE — Telephone Encounter (Signed)
May send Bryhali lotion. If that not covered, send Triamcinolone 0.1% cream.

## 2019-07-23 NOTE — Telephone Encounter (Signed)
Fax from [facility] not covered. Please advise of alternative medication.

## 2019-07-24 MED ORDER — BRYHALI 0.01 % EX LOTN: 1.0000 "application " | TOPICAL_LOTION | CUTANEOUS | 2 refills | Status: DC

## 2019-07-24 MED ORDER — TRIAMCINOLONE ACETONIDE 0.1 % EX CREA: 1.0000 "application " | TOPICAL_CREAM | CUTANEOUS | 2 refills | Status: DC

## 2019-07-24 NOTE — Telephone Encounter (Signed)
Spoke with patient and advised her of medication change. 

## 2019-07-24 NOTE — Telephone Encounter (Signed)
Left patient a message to return my call

## 2019-07-25 ENCOUNTER — Telehealth: Payer: Managed Care, Other (non HMO) | Admitting: Physician Assistant

## 2019-07-25 ENCOUNTER — Encounter: Payer: Self-pay | Admitting: Family Medicine

## 2019-07-25 NOTE — Telephone Encounter (Signed)
I offered Julia Stark a virtual visit at 4:40 this afternoon.  Pt declines at this time and stated if she is not feeling better by Monday she will call and schedule an appointment.   Thanks,   -Mickel Baas

## 2019-08-01 NOTE — Progress Notes (Signed)
I,Laura E Walsh,acting as a scribe for Lavon Paganini, MD.,have documented all relevant documentation on the behalf of Lavon Paganini, MD,as directed by  Lavon Paganini, MD while in the presence of Lavon Paganini, MD.   Established patient visit   Patient: Julia Stark   DOB: 1960-12-15   59 y.o. Female  MRN: [id number] Visit Date: 08/04/2019  Today's healthcare provider: Lavon Paganini, MD   Chief Complaint  Patient presents with  . Sinusitis  . Rash    Followed my Dermatology  . Anxiety  . Depression  . Arm Pain    Right arm   Subjective    Sinusitis This is a new problem. The current episode started 1 to 4 weeks ago. The problem has been gradually worsening since onset. There has been no fever. Associated symptoms include congestion, headaches, sinus pressure and sneezing. Pertinent negatives include no chills, diaphoresis, neck pain, shortness of breath, sore throat or swollen glands.   Follow up for anxiety  The patient was last seen for this 1 years ago. Changes made at last visit include no changes patient to continue Effexor.  She reports excellent compliance with treatment. She feels that condition is Worse. [name] not having side effects.   GAD 7 : Generalized Anxiety Score 08/04/2019 08/22/2017  Nervous, Anxious, on Edge 1 1  Control/stop worrying 1 2  Worry too much - different things 1 1  Trouble relaxing 1 1  Restless 0 0  Easily annoyed or irritable 3 0  Afraid - awful might happen 1 1  Total GAD 7 Score 8 6  Anxiety Difficulty Somewhat difficult Not difficult at all   ----------------------------------------------------------------------------------------- Reports rash from amiodarone for which she is seeing Derm  Bump in L forearm that is tender occasionally  5 flares of genital herpes recently requiring Valtrex  Patient Active Problem List   Diagnosis Date Noted  . Bursitis of left hip 08/04/2019  . Lipoma of left upper  extremity 08/04/2019  . Primary insomnia 06/23/2019  . Chronic anticoagulation 06/19/2018  . Affective disorder, major 08/22/2017  . Allergic rhinitis 08/22/2017  . Anxiety, generalized 08/22/2017  . T2DM (type 2 diabetes mellitus) ([place]) 08/22/2017  . Essential hypertension 08/22/2017  . Hyperlipidemia associated with type 2 diabetes mellitus ([place]) 08/22/2017  . Mobitz type 2 second degree atrioventricular block 02/05/2017  . Paroxysmal A-fib ([place]) 05/25/2015  . Recurrent genital HSV (herpes simplex virus) infection 03/13/2014  . Chronic constipation 02/17/2014  . Reflux gastritis 02/17/2014  . Adenomatous polyp of descending colon 02/12/2012   Past Medical History:  Diagnosis Date  . Anxiety   . Arthritis    bilateral shoulders  . Atrial fibrillation ([place])   . Cancer ([place])    BASAL CELL CARCINOMA OF FACE  . Depression   . Diabetes mellitus without complication ([place])   . Dysrhythmia    A fib  . Hyperlipidemia   . Hypertension   . Sialoadenitis 12/24/2018   Social History   Tobacco Use  . Smoking status: Never Smoker  . Smokeless tobacco: Never Used  Substance Use Topics  . Alcohol use: Yes    Alcohol/week: 4.0 standard drinks    Types: 2 Glasses of wine, 2 Cans of beer per week  . Drug use: No   Allergies  Allergen Reactions  . Rosuvastatin     muscle cramps       Medications: Outpatient Medications Prior to Visit  Medication Sig  . ALPRAZolam (XANAX) 0.25 MG tablet Take 0.25 mg  by mouth daily as needed for anxiety or sleep.   Marland Kitchen amiodarone (PACERONE) 200 MG tablet Take 200 mg by mouth daily.   Marland Kitchen apixaban (ELIQUIS) 5 MG TABS tablet Take 5 mg by mouth 2 (two) times daily.  . Melatonin 5 MG TABS Take 5 mg by mouth at bedtime.   . metFORMIN (GLUCOPHAGE) 1000 MG tablet Take 1 tablet (1,000 mg total) by mouth daily. (Patient taking differently: Take 1,000 mg by mouth at bedtime. )  . Multiple Vitamin (MULTIVITAMIN) tablet Take 1 tablet by mouth at bedtime. One-A-Day  Women's 50+  . Potassium 99 MG TABS Take 99 mg by mouth at bedtime.   . ramipril (ALTACE) 10 MG capsule Take 1 capsule (10 mg total) by mouth daily.  . traZODone (DESYREL) 50 MG tablet Take 0.5-1 tablets (25-50 mg total) by mouth at bedtime as needed for sleep.  Marland Kitchen triamcinolone cream (KENALOG) 0.1 % Apply 1 application topically as directed. Apply once to twice daily as needed  . vitamin E 400 UNIT capsule Take 800 Units by mouth daily.   . [DISCONTINUED] valACYclovir (VALTREX) 1000 MG tablet Take 1 tablet (1,000 mg total) by mouth daily. Take daily for 5 days as needed for outbreak  . [DISCONTINUED] venlafaxine XR (EFFEXOR-XR) 37.5 MG 24 hr capsule TAKE 1 CAPSULE(37.5 MG) BY MOUTH DAILY WITH BREAKFAST (Patient taking differently: Take 37.5 mg by mouth daily with breakfast. )  . atorvastatin (LIPITOR) 40 MG tablet Take 1 tablet (40 mg total) by mouth daily. (Patient not taking: Reported on 07/14/2019)  . triamcinolone (KENALOG) 0.147 MG/GM topical spray Apply to aa's B/L arms BID until clear. Avoid f/g/a.  . [DISCONTINUED] Halobetasol Propionate (BRYHALI) 0.01 % LOTN Apply 1 application topically as directed. Apply once to twice daily as needed   No facility-administered medications prior to visit.    Review of Systems  Constitutional: Positive for fatigue. Negative for activity change, appetite change, chills, diaphoresis, fever and unexpected weight change.  HENT: Positive for congestion, postnasal drip, rhinorrhea, sinus pressure, sinus pain and sneezing. Negative for ear discharge, nosebleeds, sore throat, tinnitus, trouble swallowing and voice change.   Eyes: Negative.   Respiratory: Negative.  Negative for shortness of breath.   Cardiovascular: Negative.   Gastrointestinal: Positive for constipation. Negative for abdominal distention, abdominal pain, anal bleeding, blood in stool, diarrhea, nausea (Chronic issue), rectal pain and vomiting.  Musculoskeletal: Positive for myalgias. Negative  for arthralgias, back pain, gait problem, joint swelling, neck pain and neck stiffness.  Neurological: Positive for light-headedness and headaches. Negative for dizziness.    Last CBC Lab Results  Component Value Date   WBC 6.9 12/23/2018   HGB 13.1 12/23/2018   HCT 39.3 12/23/2018   MCV 90 12/23/2018   MCH 30.0 12/23/2018   RDW 12.7 12/23/2018   PLT 193 [id number]   Last metabolic panel Lab Results  Component Value Date   GLUCOSE 133 (H) 12/23/2018   NA 141 12/23/2018   K 4.0 12/23/2018   CL 101 12/23/2018   CO2 27 12/23/2018   BUN 11 12/23/2018   CREATININE 0.61 12/23/2018   GFRNONAA 100 12/23/2018   GFRAA 116 12/23/2018   CALCIUM 9.8 12/23/2018   PROT 7.7 12/23/2018   ALBUMIN 4.6 12/23/2018   LABGLOB 3.1 12/23/2018   AGRATIO 1.5 12/23/2018   BILITOT 0.4 12/23/2018   ALKPHOS 81 12/23/2018   AST 27 12/23/2018   ALT 25 12/23/2018   ANIONGAP 7 10/18/2012   Last lipids Lab Results  Component Value Date  CHOL 151 07/01/2019   HDL 47 07/01/2019   LDLCALC 78 07/01/2019   TRIG 153 (H) 07/01/2019   CHOLHDL 3.2 07/01/2019   Last hemoglobin A1c Lab Results  Component Value Date   HGBA1C 6.3 (A) 06/23/2019   Last thyroid functions Lab Results  Component Value Date   TSH 0.92 06/12/2014   Last vitamin D No results found for: 25OHVITD2, 25OHVITD3, VD25OH   Last vitamin B12 and Folate No results found for: VITAMINB12, FOLATE    Objective    BP 134/68 (BP Location: Left Arm, Patient Position: Sitting, Cuff Size: Normal)   Pulse 80   Temp (!) 97.1 F (36.2 C) (Temporal)   Wt 197 lb (89.4 kg)   LMP 04/13/2011   SpO2 98%   BMI 29.95 kg/m  BP Readings from Last 3 Encounters:  08/04/19 134/68  06/23/19 116/74  06/11/19 (!) 108/53      Physical Exam Constitutional:      General: [name] not in acute distress.    Appearance: Normal appearance. [name] not diaphoretic.  HENT:     Head: Normocephalic and atraumatic.  Eyes:     Conjunctiva/sclera:  Conjunctivae normal.  Cardiovascular:     Rate and Rhythm: Normal rate and regular rhythm.     Pulses: Normal pulses.     Heart sounds: Normal heart sounds. No murmur.  Pulmonary:     Effort: Pulmonary effort is normal. No respiratory distress.     Breath sounds: Normal breath sounds. No wheezing.  Abdominal:     General: There is no distension.     Palpations: Abdomen is soft.     Tenderness: There is no abdominal tenderness.  Musculoskeletal:     Cervical back: Normal range of motion and neck supple.     Left hip: Tenderness (greater trochanter) present.     Right lower leg: No edema.     Left lower leg: No edema.  Skin:    General: Skin is warm and dry.     Findings: Rash present.  Neurological:     Mental Status: [name] alert and oriented to person, place, and time. Mental status is at baseline.  Psychiatric:        Mood and Affect: Mood normal.        Behavior: Behavior normal.        Thought Content: Thought content normal.        Judgment: Judgment normal.       No results found for any visits on 08/04/19.  Assessment & Plan     Problem List Items Addressed This Visit      Respiratory   Allergic rhinitis    Chronic and uncontrolled.  Discussed using flonase daily May also use zyrtec or Claritin daily.   No acute sinusitis at this time        Musculoskeletal and Integument   Bursitis of left hip    Left worse then right. Discuss conservative treatment  Rehab exercises provided. Consider injections at follow up on two months if not improved.          Genitourinary   Recurrent genital HSV (herpes simplex virus) infection    Pt is having increased outbreaks. Only taking Valtrex as needed right now.  Will try a daily dose of Valtrex for suppression      Relevant Medications   valACYclovir (VALTREX) 500 MG tablet     Other   Anxiety, generalized - Primary    Chronic and uncontrolled. Will increase Effexor  to 75mg  a day.      Relevant  Medications   venlafaxine XR (EFFEXOR XR) 75 MG 24 hr capsule   Primary insomnia    Chronic and unchanged.  Uncontrolled anxiety likely the cause of insomnia.  Will try to increase Effexor as above.        Lipoma of left upper extremity    New  Left forearm. Will continue to monitor.           Return in about 2 months (around 10/04/2019) for Anxiety and hip pain.Lajuana Matte, MD, have reviewed all documentation for this visit. The documentation on 08/04/19 for the exam, diagnosis, procedures, and orders are all accurate and complete.   Yuka Lallier, Dionne Bucy, MD, MPH [facility]

## 2019-08-04 ENCOUNTER — Ambulatory Visit: Payer: Managed Care, Other (non HMO) | Admitting: Family Medicine

## 2019-08-04 ENCOUNTER — Other Ambulatory Visit: Payer: Self-pay

## 2019-08-04 ENCOUNTER — Encounter: Payer: Self-pay | Admitting: Family Medicine

## 2019-08-04 VITALS — BP 134/68 | HR 80 | Temp 97.1°F | Wt 197.0 lb

## 2019-08-04 DIAGNOSIS — M7072 Other bursitis of hip, left hip: Secondary | ICD-10-CM

## 2019-08-04 DIAGNOSIS — F5101 Primary insomnia: Secondary | ICD-10-CM | POA: Diagnosis not present

## 2019-08-04 DIAGNOSIS — J302 Other seasonal allergic rhinitis: Secondary | ICD-10-CM | POA: Diagnosis not present

## 2019-08-04 DIAGNOSIS — F411 Generalized anxiety disorder: Secondary | ICD-10-CM | POA: Diagnosis not present

## 2019-08-04 DIAGNOSIS — D1722 Benign lipomatous neoplasm of skin and subcutaneous tissue of left arm: Secondary | ICD-10-CM

## 2019-08-04 DIAGNOSIS — A6000 Herpesviral infection of urogenital system, unspecified: Secondary | ICD-10-CM

## 2019-08-04 MED ORDER — FLUTICASONE PROPIONATE 50 MCG/ACT NA SUSP: 2.0000 | Freq: Every day | NASAL | 1 refills | Status: DC

## 2019-08-04 MED ORDER — VENLAFAXINE HCL ER 75 MG PO CP24: 75.0000 mg | ORAL_CAPSULE | Freq: Every day | ORAL | 1 refills | Status: DC

## 2019-08-04 MED ORDER — VALACYCLOVIR HCL 500 MG PO TABS: 500.0000 mg | ORAL_TABLET | Freq: Every day | ORAL | 1 refills | Status: DC

## 2019-08-04 NOTE — Assessment & Plan Note (Signed)
Chronic and unchanged.  Uncontrolled anxiety likely the cause of insomnia.  Will try to increase Effexor as above.

## 2019-08-04 NOTE — Assessment & Plan Note (Addendum)
Pt is having increased outbreaks. Only taking Valtrex as needed right now.  Will try a daily dose of Valtrex for suppression

## 2019-08-04 NOTE — Assessment & Plan Note (Addendum)
Chronic and uncontrolled.  Discussed using flonase daily May also use zyrtec or Claritin daily.   No acute sinusitis at this time

## 2019-08-04 NOTE — Assessment & Plan Note (Signed)
New  Left forearm. Will continue to monitor.

## 2019-08-04 NOTE — Assessment & Plan Note (Signed)
Left worse then right. Discuss conservative treatment  Rehab exercises provided. Consider injections at follow up on two months if not improved.

## 2019-08-04 NOTE — Assessment & Plan Note (Signed)
Chronic and uncontrolled. Will increase Effexor to 75mg  a day.

## 2019-08-04 NOTE — Patient Instructions (Signed)
Hip Bursitis  Hip bursitis is swelling of a fluid-filled sac (bursa) in your hip joint. This swelling (inflammation) can be painful. This condition may come and go over time. What are the causes?  Injury to the hip.  Overuse of the muscles that surround the hip joint.  An earlier injury or surgery of the hip.  Arthritis or gout.  Diabetes.  Thyroid disease.  Infection.  In some cases, the cause may not be known. What are the signs or symptoms?  Mild or moderate pain in the hip area. Pain may get worse with movement.  Tenderness and swelling of the hip, especially on the outer side of the hip.  In rare cases, the bursa may become infected. This may cause: ? A fever. ? Warmth and redness in the area. Symptoms may come and go. How is this treated? This condition is treated by resting, icing, applying pressure (compression), and raising (elevating) the injured area. You may hear this called the RICE treatment. Treatment may also include:  Using crutches.  Draining fluid out of the bursa to help relieve swelling.  Giving a shot of (injecting) medicine that helps to reduce swelling (cortisone).  Other medicines if the bursa is infected. Follow these instructions at home: Managing pain, stiffness, and swelling   If told, put ice on the painful area. ? Put ice in a plastic bag. ? Place a towel between your skin and the bag. ? Leave the ice on for 20 minutes, 2-3 times a day. ? Raise (elevate) your hip above the level of your heart as much as you can without pain. To do this, try putting a pillow under your hips while you lie down. Stop if this causes pain. Activity  Return to your normal activities as told by your doctor. Ask your doctor what activities are safe for you.  Rest and protect your hip as much as you can until you feel better. General instructions  Take over-the-counter and prescription medicines only as told by your doctor.  Wear wraps that put pressure  on your hip (compression wraps) only as told by your doctor.  Do not use your hip to support your body weight until your doctor says that you can.  Use crutches as told by your doctor.  Gently rub and stretch your injured area as often as is comfortable.  Keep all follow-up visits as told by your doctor. This is important. How is this prevented?  Exercise regularly, as told by your doctor.  Warm up and stretch before being active.  Cool down and stretch after being active.  Avoid activities that bother your hip or cause pain.  Avoid sitting down for long periods at a time. Contact a doctor if:  You have a fever.  You get new symptoms.  You have trouble walking.  You have trouble doing everyday activities.  You have pain that gets worse.  You have pain that does not get better with medicine.  You get red skin on your hip area.  You get a feeling of warmth in your hip area. Get help right away if:  You cannot move your hip.  You have very bad pain. Summary  Hip bursitis is swelling of a fluid-filled sac (bursa) in your hip.  Hip bursitis can be painful.  Symptoms often come and go over time.  This condition is treated with rest, ice, compression, elevation, and medicines. This information is not intended to replace advice given to you by your health care provider.   Make sure you discuss any questions you have with your health care provider. Document Revised: 11/26/2017 Document Reviewed: 11/26/2017 Elsevier Patient Education  [place]. Generalized Anxiety Disorder, Adult Generalized anxiety disorder (GAD) is a mental health disorder. People with this condition constantly worry about everyday events. Unlike normal anxiety, worry related to GAD is not triggered by a specific event. These worries also do not fade or get better with time. GAD interferes with life functions, including relationships, work, and school. GAD can vary from mild to severe. People  with severe GAD can have intense waves of anxiety with physical symptoms (panic attacks). What are the causes? The exact cause of GAD is not known. What increases the risk? This condition is more likely to develop in:  Women.  People who have a family history of anxiety disorders.  People who are very shy.  People who experience very stressful life events, such as the death of a loved one.  People who have a very stressful family environment. What are the signs or symptoms? People with GAD often worry excessively about many things in their lives, such as their health and family. They may also be overly concerned about:  Doing well at work.  Being on time.  Natural disasters.  Friendships. Physical symptoms of GAD include:  Fatigue.  Muscle tension or having muscle twitches.  Trembling or feeling shaky.  Being easily startled.  Feeling like your heart is pounding or racing.  Feeling out of breath or like you cannot take a deep breath.  Having trouble falling asleep or staying asleep.  Sweating.  Nausea, diarrhea, or irritable bowel syndrome (IBS).  Headaches.  Trouble concentrating or remembering facts.  Restlessness.  Irritability. How is this diagnosed? Your health care provider can diagnose GAD based on your symptoms and medical history. You will also have a physical exam. The health care provider will ask specific questions about your symptoms, including how severe they are, when they started, and if they come and go. Your health care provider may ask you about your use of alcohol or drugs, including prescription medicines. Your health care provider may refer you to a mental health specialist for further evaluation. Your health care provider will do a thorough examination and may perform additional tests to rule out other possible causes of your symptoms. To be diagnosed with GAD, a person must have anxiety that:  Is out of his or her control.  Affects  several different aspects of his or her life, such as work and relationships.  Causes distress that makes him or her unable to take part in normal activities.  Includes at least three physical symptoms of GAD, such as restlessness, fatigue, trouble concentrating, irritability, muscle tension, or sleep problems. Before your health care provider can confirm a diagnosis of GAD, these symptoms must be present more days than they are not, and they must last for six months or longer. How is this treated? The following therapies are usually used to treat GAD:  Medicine. Antidepressant medicine is usually prescribed for long-term daily control. Antianxiety medicines may be added in severe cases, especially when panic attacks occur.  Talk therapy (psychotherapy). Certain types of talk therapy can be helpful in treating GAD by providing support, education, and guidance. Options include: ? Cognitive behavioral therapy (CBT). People learn coping skills and techniques to ease their anxiety. They learn to identify unrealistic or negative thoughts and behaviors and to replace them with positive ones. ? Acceptance and commitment therapy (ACT). This treatment  teaches people how to be mindful as a way to cope with unwanted thoughts and feelings. ? Biofeedback. This process trains you to manage your body's response (physiological response) through breathing techniques and relaxation methods. You will work with a therapist while machines are used to monitor your physical symptoms.  Stress management techniques. These include yoga, meditation, and exercise. A mental health specialist can help determine which treatment is best for you. Some people see improvement with one type of therapy. However, other people require a combination of therapies. Follow these instructions at home:  Take over-the-counter and prescription medicines only as told by your health care provider.  Try to maintain a normal routine.  Try to  anticipate stressful situations and allow extra time to manage them.  Practice any stress management or self-calming techniques as taught by your health care provider.  Do not punish yourself for setbacks or for not making progress.  Try to recognize your accomplishments, even if they are small.  Keep all follow-up visits as told by your health care provider. This is important. Contact a health care provider if:  Your symptoms do not get better.  Your symptoms get worse.  You have signs of depression, such as: ? A persistently sad, cranky, or irritable mood. ? Loss of enjoyment in activities that used to bring you joy. ? Change in weight or eating. ? Changes in sleeping habits. ? Avoiding friends or family members. ? Loss of energy for normal tasks. ? Feelings of guilt or worthlessness. Get help right away if:  You have serious thoughts about hurting yourself or others. If you ever feel like you may hurt yourself or others, or have thoughts about taking your own life, get help right away. You can go to your nearest emergency department or call:  Your local emergency services (911 in the [place]  A suicide crisis helpline, such as the [facility] at 717-128-1700. This is open 24 hours a day. Summary  Generalized anxiety disorder (GAD) is a mental health disorder that involves worry that is not triggered by a specific event.  People with GAD often worry excessively about many things in their lives, such as their health and family.  GAD may cause physical symptoms such as restlessness, trouble concentrating, sleep problems, frequent sweating, nausea, diarrhea, headaches, and trembling or muscle twitching.  A mental health specialist can help determine which treatment is best for you. Some people see improvement with one type of therapy. However, other people require a combination of therapies. This information is not intended to replace advice given to  you by your health care provider. Make sure you discuss any questions you have with your health care provider. Document Revised: 03/02/2017 Document Reviewed: 02/08/2016 Elsevier Patient Education  2020 [employer].

## 2019-08-15 DIAGNOSIS — I483 Typical atrial flutter: Secondary | ICD-10-CM | POA: Insufficient documentation

## 2019-08-25 ENCOUNTER — Encounter: Payer: Self-pay | Admitting: Family Medicine

## 2019-08-26 MED ORDER — AMOXICILLIN-POT CLAVULANATE 875-125 MG PO TABS: 1.0000 | ORAL_TABLET | Freq: Two times a day (BID) | ORAL | 0 refills | Status: AC

## 2019-09-08 ENCOUNTER — Encounter: Payer: Self-pay | Admitting: Family Medicine

## 2019-09-09 ENCOUNTER — Encounter: Payer: Self-pay | Admitting: Family Medicine

## 2019-09-09 MED ORDER — TRAZODONE HCL 50 MG PO TABS: 25.0000 mg | ORAL_TABLET | Freq: Every evening | ORAL | 1 refills | Status: DC | PRN

## 2019-09-09 NOTE — Telephone Encounter (Signed)
Forwarding for review, I don;t see recent antibiotics.

## 2019-09-10 NOTE — Telephone Encounter (Signed)
I don't see any recent antibiotics

## 2019-09-11 MED ORDER — FLUCONAZOLE 150 MG PO TABS: 150.0000 mg | ORAL_TABLET | Freq: Once | ORAL | 0 refills | Status: AC

## 2019-09-11 NOTE — Telephone Encounter (Signed)
In her medication history there is an Rx for the Augmenting on 08/26/19 by you.  In her dispense history it shows it was filled by [facility]

## 2019-09-25 ENCOUNTER — Encounter: Payer: Self-pay | Admitting: Physician Assistant

## 2019-09-29 ENCOUNTER — Encounter: Payer: Self-pay | Admitting: Dermatology

## 2019-09-29 ENCOUNTER — Other Ambulatory Visit: Payer: Self-pay

## 2019-09-29 ENCOUNTER — Other Ambulatory Visit: Payer: Self-pay | Admitting: Unknown Physician Specialty

## 2019-09-29 ENCOUNTER — Ambulatory Visit
Admission: RE | Admit: 2019-09-29 | Discharge: 2019-09-29 | Disposition: A | Discharge status: Home / Self Care | Payer: Managed Care, Other (non HMO) | Attending: Unknown Physician Specialty | Admitting: Unknown Physician Specialty

## 2019-09-29 ENCOUNTER — Ambulatory Visit
Admission: RE | Admit: 2019-09-29 | Discharge: 2019-09-29 | Disposition: A | Discharge status: Home / Self Care | Payer: Managed Care, Other (non HMO) | Source: Ambulatory Visit | Attending: Unknown Physician Specialty | Admitting: Unknown Physician Specialty

## 2019-09-29 DIAGNOSIS — R51 Headache with orthostatic component, not elsewhere classified: Secondary | ICD-10-CM | POA: Diagnosis present

## 2019-10-01 ENCOUNTER — Ambulatory Visit: Payer: Managed Care, Other (non HMO) | Admitting: Family Medicine

## 2019-10-03 ENCOUNTER — Encounter: Payer: Self-pay | Admitting: Advanced Practice Midwife

## 2019-10-03 ENCOUNTER — Other Ambulatory Visit: Payer: Self-pay

## 2019-10-03 ENCOUNTER — Other Ambulatory Visit (HOSPITAL_COMMUNITY)
Admission: RE | Admit: 2019-10-03 | Discharge: 2019-10-03 | Disposition: A | Discharge status: Home / Self Care | Payer: Managed Care, Other (non HMO) | Source: Ambulatory Visit | Attending: Advanced Practice Midwife | Admitting: Advanced Practice Midwife

## 2019-10-03 ENCOUNTER — Ambulatory Visit (INDEPENDENT_AMBULATORY_CARE_PROVIDER_SITE_OTHER): Payer: Managed Care, Other (non HMO) | Admitting: Advanced Practice Midwife

## 2019-10-03 VITALS — BP 132/84 | Wt 203.0 lb

## 2019-10-03 DIAGNOSIS — Z124 Encounter for screening for malignant neoplasm of cervix: Secondary | ICD-10-CM | POA: Diagnosis present

## 2019-10-03 DIAGNOSIS — C44310 Basal cell carcinoma of skin of unspecified parts of face: Secondary | ICD-10-CM | POA: Insufficient documentation

## 2019-10-03 DIAGNOSIS — Z01419 Encounter for gynecological examination (general) (routine) without abnormal findings: Secondary | ICD-10-CM

## 2019-10-03 DIAGNOSIS — Z1231 Encounter for screening mammogram for malignant neoplasm of breast: Secondary | ICD-10-CM | POA: Diagnosis not present

## 2019-10-03 NOTE — Patient Instructions (Signed)
Health Maintenance, Female Adopting a healthy lifestyle and getting preventive care are important in promoting health and wellness. Ask your health care provider about:  The right schedule for you to have regular tests and exams.  Things you can do on your own to prevent diseases and keep yourself healthy. What should I know about diet, weight, and exercise? Eat a healthy diet   Eat a diet that includes plenty of vegetables, fruits, low-fat dairy products, and lean protein.  Do not eat a lot of foods that are high in solid fats, added sugars, or sodium. Maintain a healthy weight Body mass index (BMI) is used to identify weight problems. It estimates body fat based on height and weight. Your health care provider can help determine your BMI and help you achieve or maintain a healthy weight. Get regular exercise Get regular exercise. This is one of the most important things you can do for your health. Most adults should:  Exercise for at least 150 minutes each week. The exercise should increase your heart rate and make you sweat (moderate-intensity exercise).  Do strengthening exercises at least twice a week. This is in addition to the moderate-intensity exercise.  Spend less time sitting. Even light physical activity can be beneficial. Watch cholesterol and blood lipids Have your blood tested for lipids and cholesterol at 59 years of age, then have this test every 5 years. Have your cholesterol levels checked more often if:  Your lipid or cholesterol levels are high.  You are older than 59 years of age.  You are at high risk for heart disease. What should I know about cancer screening? Depending on your health history and family history, you may need to have cancer screening at various ages. This may include screening for:  Breast cancer.  Cervical cancer.  Colorectal cancer.  Skin cancer.  Lung cancer. What should I know about heart disease, diabetes, and high blood  pressure? Blood pressure and heart disease  High blood pressure causes heart disease and increases the risk of stroke. This is more likely to develop in people who have high blood pressure readings, are of African descent, or are overweight.  Have your blood pressure checked: ? Every 3-5 years if you are 18-39 years of age. ? Every year if you are 40 years old or older. Diabetes Have regular diabetes screenings. This checks your fasting blood sugar level. Have the screening done:  Once every three years after age 40 if you are at a normal weight and have a low risk for diabetes.  More often and at a younger age if you are overweight or have a high risk for diabetes. What should I know about preventing infection? Hepatitis B If you have a higher risk for hepatitis B, you should be screened for this virus. Talk with your health care provider to find out if you are at risk for hepatitis B infection. Hepatitis C Testing is recommended for:  Everyone born from 1945 through 1965.  Anyone with known risk factors for hepatitis C. Sexually transmitted infections (STIs)  Get screened for STIs, including gonorrhea and chlamydia, if: ? You are sexually active and are younger than 59 years of age. ? You are older than 59 years of age and your health care provider tells you that you are at risk for this type of infection. ? Your sexual activity has changed since you were last screened, and you are at increased risk for chlamydia or gonorrhea. Ask your health care provider if   you are at risk.  Ask your health care provider about whether you are at high risk for HIV. Your health care provider may recommend a prescription medicine to help prevent HIV infection. If you choose to take medicine to prevent HIV, you should first get tested for HIV. You should then be tested every 3 months for as long as you are taking the medicine. Pregnancy  If you are about to stop having your period (premenopausal) and  you may become pregnant, seek counseling before you get pregnant.  Take 400 to 800 micrograms (mcg) of folic acid every day if you become pregnant.  Ask for birth control (contraception) if you want to prevent pregnancy. Osteoporosis and menopause Osteoporosis is a disease in which the bones lose minerals and strength with aging. This can result in bone fractures. If you are 65 years old or older, or if you are at risk for osteoporosis and fractures, ask your health care provider if you should:  Be screened for bone loss.  Take a calcium or vitamin D supplement to lower your risk of fractures.  Be given hormone replacement therapy (HRT) to treat symptoms of menopause. Follow these instructions at home: Lifestyle  Do not use any products that contain nicotine or tobacco, such as cigarettes, e-cigarettes, and chewing tobacco. If you need help quitting, ask your health care provider.  Do not use street drugs.  Do not share needles.  Ask your health care provider for help if you need support or information about quitting drugs. Alcohol use  Do not drink alcohol if: ? Your health care provider tells you not to drink. ? You are pregnant, may be pregnant, or are planning to become pregnant.  If you drink alcohol: ? Limit how much you use to 0-1 drink a day. ? Limit intake if you are breastfeeding.  Be aware of how much alcohol is in your drink. In the [place] one drink equals one 12 oz bottle of beer (355 mL), one 5 oz glass of wine (148 mL), or one 1 oz glass of hard liquor (44 mL). General instructions  Schedule regular health, dental, and eye exams.  Stay current with your vaccines.  Tell your health care provider if: ? You often feel depressed. ? You have ever been abused or do not feel safe at home. Summary  Adopting a healthy lifestyle and getting preventive care are important in promoting health and wellness.  Follow your health care provider's instructions about healthy  diet, exercising, and getting tested or screened for diseases.  Follow your health care provider's instructions on monitoring your cholesterol and blood pressure. This information is not intended to replace advice given to you by your health care provider. Make sure you discuss any questions you have with your health care provider. Document Revised: 03/13/2018 Document Reviewed: 03/13/2018 Elsevier Patient Education  2020 Elsevier Inc.  

## 2019-10-07 ENCOUNTER — Other Ambulatory Visit: Payer: Self-pay | Admitting: Family Medicine

## 2019-10-07 MED ORDER — RAMIPRIL 10 MG PO CAPS: 10.0000 mg | ORAL_CAPSULE | Freq: Every day | ORAL | 1 refills | Status: DC

## 2019-10-07 NOTE — Telephone Encounter (Signed)
[facility] faxed refill request for the following medications:   ramipril (ALTACE) 10 MG capsule  Please advise.  Thanks, [employer]

## 2019-10-08 LAB — CYTOLOGY - PAP
Comment: NEGATIVE
Diagnosis: NEGATIVE
High risk HPV: NEGATIVE

## 2019-10-08 NOTE — Progress Notes (Signed)
Gynecology Annual Exam  Date of Service: 10/03/2019  PCP: Virginia Crews, MD  Chief Complaint:  Chief Complaint  Patient presents with   Gynecologic Exam    History of Present Illness:Patient is a 59 y.o. G3 P3003 presents for annual exam. The patient has complaints today of some brief sharp pains she is feeling in her bilateral pelvis for the past month. She also has vaginal pain with intercourse. She prefers to start at the lowest level of intervention for treatment.  LMP: Patient's last menstrual period was 04/13/2011.  Postcoital Bleeding: no Dysmenorrhea: not applicable  The patient is sexually active. She admits to dyspareunia.  The patient does perform self breast exams.  There is notable family history of breast or ovarian cancer in her family. A cousin had ovarian cancer. [name] undecided regarding genetic testing.   The patient wears seatbelts: yes.   The patient has regular exercise: She has a history of cardiac ablation and has limited exercise. She has begun walking. She eats a healthy diabetic diet and admits adequate hydration. She takes Trazodone to help her sleep.    The patient denies current symptoms of depression. 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      Review of Systems: Review of Systems  Constitutional: Negative for chills and fever.  HENT: Positive for congestion. Negative for ear discharge, ear pain, hearing loss, sinus pain and sore throat.        Positive for tender area in left jaw line (saw ENT who mentioned TMJ)  Eyes: Negative for blurred vision and double vision.  Respiratory: Negative for cough, shortness of breath and wheezing.   Cardiovascular: Negative for chest pain, palpitations and leg swelling.  Gastrointestinal: Positive for constipation, nausea and vomiting. Negative for abdominal pain, blood in stool, diarrhea, heartburn and  melena.  Genitourinary: Negative for dysuria, flank pain, frequency, hematuria and urgency.       Positive for pain with intercourse, incontinence  Musculoskeletal: Positive for joint pain. Negative for back pain and myalgias.  Skin: Negative for itching and rash.  Neurological: Negative for dizziness, tingling, tremors, sensory change, speech change, focal weakness, seizures, loss of consciousness, weakness and headaches.  Endo/Heme/Allergies: Negative for environmental allergies. Does not bruise/bleed easily.  Psychiatric/Behavioral: Negative for depression, hallucinations, memory loss, substance abuse and suicidal ideas. The patient is not nervous/anxious and does not have insomnia.        Positive for anxiety    Past Medical History:  Patient Active Problem List   Diagnosis Date Noted   Basal cell carcinoma of face 10/03/2019    Formatting of this note might be different from the original. no recurrence    Typical atrial flutter ([place]) 08/15/2019   Bursitis of left hip 08/04/2019   Lipoma of left upper extremity 08/04/2019   Primary insomnia 06/23/2019   Red blood cell antibody positive 05/22/2019    Formatting of this note might be different from the original. Anti-M detected. Please allow 2 hours to prepare appropriate RBCs for transfusion.    Chronic anticoagulation 06/19/2018   Affective disorder, major 08/22/2017   Allergic rhinitis 08/22/2017   Anxiety, generalized 08/22/2017   T2DM (type 2 diabetes mellitus) ([place]) 08/22/2017    Urine microalbumin-50 on 08/21/2012.    Essential hypertension 08/22/2017   Hyperlipidemia associated with type 2 diabetes mellitus ([place]) 08/22/2017   Mobitz type 2 second  degree atrioventricular block 02/05/2017   Paroxysmal A-fib ([facility]) 05/25/2015   Recurrent genital HSV (herpes simplex virus) infection 03/13/2014   Chronic constipation 02/17/2014   Reflux gastritis 02/17/2014   Adenomatous polyp of descending colon  02/12/2012    Past Surgical History:  Past Surgical History:  Procedure Laterality Date   BASAL CELL CARCINOMA EXCISION     CARDIAC ELECTROPHYSIOLOGY MAPPING AND ABLATION  05/2019   CARDIOVERSION N/A 03/04/2019   Procedure: CARDIOVERSION;  Surgeon: Corey Skains, MD;  Location: [facility];  Service: Cardiovascular;  Laterality: N/A;   CARDIOVERSION N/A 06/11/2019   Procedure: CARDIOVERSION;  Surgeon: Corey Skains, MD;  Location: [facility];  Service: Cardiovascular;  Laterality: N/A;   [place]     COLONOSCOPY  02/12/2012   COLONOSCOPY WITH PROPOFOL N/A 04/09/2017   Procedure: COLONOSCOPY WITH PROPOFOL;  Surgeon: Manya Silvas, MD;  Location: [facility];  Service: Endoscopy;  Laterality: N/A;   ELECTROPHYSIOLOGIC STUDY N/A 05/12/2015   Procedure: Cardioversion;  Surgeon: Corey Skains, MD;  Location: [facility];  Service: Cardiovascular;  Laterality: N/A;   [place]    Gynecologic History:  Patient's last menstrual period was 04/13/2011. Last Pap: "a while ago" Results were:  no abnormalities  Last mammogram: 1 year ago Results were: BI-RAD I  Obstetric History: No obstetric history on file.  Family History:  Family History  Problem Relation Age of Onset   Diabetes Mother    Hypertension Mother    Skin cancer Mother    Depression Mother    Alzheimer's disease Father    Colon cancer Maternal Uncle    Hypertension Maternal Grandfather    Hyperlipidemia Maternal Grandfather    Diabetes Paternal Grandmother    Heart disease Paternal Grandmother    Prostate cancer Paternal Grandfather    Ovarian cancer Cousin    Diabetes Sister    Depression Sister    Diabetes Brother    Breast cancer Neg Hx    Cervical cancer Neg Hx     Social History:  Social History   Socioeconomic History   Marital status: Divorced    Spouse name: Not on file   Number of children: 3   Years of education: 12    Highest education level: High school graduate  Occupational History    Employer: [employer]    Comment: in special chemistry  Tobacco Use   Smoking status: Never Smoker   Smokeless tobacco: Never Used  Vaping Use   Vaping Use: Never used  Substance and Sexual Activity   Alcohol use: Yes    Alcohol/week: 4.0 standard drinks    Types: 2 Glasses of wine, 2 Cans of beer per week   Drug use: No   Sexual activity: Yes    Partners: Male    Birth control/protection: Post-menopausal, Surgical  Other Topics Concern   Not on file  Social History Narrative   Not on file   Social Determinants of Health   Financial Resource Strain:    Difficulty of Paying Living Expenses:   Food Insecurity:    Worried About Charity fundraiser in the Last Year:    Arboriculturist in the Last Year:   Transportation Needs:    Film/video editor (Medical):    Lack of Transportation (Non-Medical):   Physical Activity:    Days of Exercise per Week:    Minutes of Exercise per Session:   Stress:    Feeling of  Stress :   Social Connections:    Frequency of Communication with Friends and Family:    Frequency of Social Gatherings with Friends and Family:    Attends Religious Services:    Active Member of Clubs or Organizations:    Attends Archivist Meetings:    Marital Status:   Intimate Partner Violence:    Fear of Current or Ex-Partner:    Emotionally Abused:    Physically Abused:    Sexually Abused:     Allergies:  Allergies  Allergen Reactions   Rosuvastatin     muscle cramps    Medications: Prior to Admission medications   Medication Sig Start Date End Date Taking? Authorizing Provider  ALPRAZolam Duanne Moron) 0.25 MG tablet Take 0.25 mg by mouth daily as needed for anxiety or sleep.    Yes [provider]  amiodarone (PACERONE) 200 MG tablet Take 200 mg by mouth daily.    Yes [provider]  apixaban (ELIQUIS) 5 MG TABS tablet Take 5  mg by mouth 2 (two) times daily.   Yes [provider]  fluticasone (FLONASE) 50 MCG/ACT nasal spray Place 2 sprays into both nostrils daily. 08/04/19  Yes Bacigalupo, Dionne Bucy, MD  Melatonin 5 MG TABS Take 5 mg by mouth at bedtime.    Yes [provider]  Multiple Vitamin (MULTIVITAMIN) tablet Take 1 tablet by mouth at bedtime. One-A-Day Women's 50+   Yes [provider]  Potassium 99 MG TABS Take 99 mg by mouth at bedtime.    Yes [provider]  traZODone (DESYREL) 50 MG tablet Take 0.5-1 tablets (25-50 mg total) by mouth at bedtime as needed for sleep. 09/09/19  Yes Bacigalupo, Dionne Bucy, MD  triamcinolone (KENALOG) 0.147 MG/GM topical spray Apply to aa's B/L arms BID until clear. Avoid f/g/a. 07/14/19  Yes Ralene Bathe, MD  triamcinolone cream (KENALOG) 0.1 % Apply 1 application topically as directed. Apply once to twice daily as needed 07/24/19  Yes Ralene Bathe, MD  valACYclovir (VALTREX) 500 MG tablet Take 1 tablet (500 mg total) by mouth daily. 08/04/19  Yes Virginia Crews, MD  venlafaxine XR (EFFEXOR XR) 75 MG 24 hr capsule Take 1 capsule (75 mg total) by mouth daily with breakfast. 08/04/19  Yes Bacigalupo, Dionne Bucy, MD  vitamin E 400 UNIT capsule Take 800 Units by mouth daily.    Yes [provider]  ramipril (ALTACE) 10 MG capsule Take 1 capsule (10 mg total) by mouth daily. 10/07/19   Mar Daring, PA-C    Physical Exam Vitals: Blood pressure 132/84, weight 203 lb (92.1 kg), last menstrual period 04/13/2011.  General: NAD HEENT: normocephalic, anicteric, tender knot like area under left jaw line Thyroid: no enlargement, no palpable nodules Pulmonary: No increased work of breathing, CTAB Cardiovascular: RRR, distal pulses 2+ Breast: Breast symmetrical, no tenderness, no palpable nodules or masses, no skin or nipple retraction present, no nipple discharge.  No axillary or supraclavicular lymphadenopathy. Abdomen: NABS, soft,  non-tender, non-distended.  Umbilicus without lesions.  No hepatomegaly, splenomegaly or masses palpable. No evidence of hernia  Genitourinary:  External: Normal external female genitalia.  Normal urethral meatus, normal Bartholin's and Skene's glands.    Vagina: Normal vaginal mucosa, no evidence of prolapse.    Cervix: Grossly normal in appearance, no bleeding, no CMT  Uterus: Non-enlarged, mobile, normal contour.    Adnexa: ovaries non-enlarged, no adnexal masses, mildly tender to palpation  Rectal: deferred  Lymphatic: no evidence of inguinal  lymphadenopathy Extremities: no edema, erythema, or tenderness Neurologic: Grossly intact Psychiatric: mood appropriate, affect full    Assessment: 59 y.o. G3 P3003 routine annual exam  Plan: Problem List Items Addressed This Visit    None    Visit Diagnoses    Well woman exam with routine gynecological exam    -  Primary   Relevant Orders   Cytology - PAP (Completed)   Encounter for screening mammogram for malignant neoplasm of breast       Relevant Orders   MM DIGITAL SCREENING BILATERAL   Cervical cancer screening       Relevant Orders   Cytology - PAP (Completed)      1) Mammogram - recommend yearly screening mammogram.  Mammogram Was ordered today  2) STI screening  was offered and declined  3) ASCCP guidelines and rational discussed.  Patient opts for every 5 years screening interval  4) Osteoporosis  - per USPTF routine screening DEXA at age 28  Consider FDA-approved medical therapies in postmenopausal women and men aged 23 years and older, based on the following: a) A hip or vertebral (clinical or morphometric) fracture b) T-score ? -2.5 at the femoral neck or spine after appropriate evaluation to exclude secondary causes C) Low bone mass (T-score between -1.0 and -2.5 at the femoral neck or spine) and a 10-year probability of a hip fracture ? 3% or a 10-year probability of a major osteoporosis-related fracture ? 20%  based on the [place]-adapted WHO algorithm   5) Routine healthcare maintenance including cholesterol, diabetes screening discussed managed by PCP  6) Colonoscopy is up to date.  Screening recommended starting at age 22 for average risk individuals, age 43 for individuals deemed at increased risk (including African Americans) and recommended to continue until age 74.  For patient age 32-85 individualized approach is recommended.  Gold standard screening is via colonoscopy, Cologuard screening is an acceptable alternative for patient unwilling or unable to undergo colonoscopy.  "Colorectal cancer screening for average?risk adults: 2018 guideline update from the American Cancer Society"CA: A Cancer Journal for Clinicians: Aug 30, 2016   7) Pain with intercourse: First try: coconut oil, hyaluronic acid OTC. Can consider IntraRosa or HRT (would want to know if patient has BRCA gene)  8) Return in about 1 year (around 10/02/2020) for annual established gyn.    Christean Leaf, CNM [facility] 10/08/19, 2:10 PM

## 2019-10-13 ENCOUNTER — Telehealth: Payer: Self-pay

## 2019-10-13 NOTE — Telephone Encounter (Signed)
Copied from [place] [phone]. Topic: General - Inquiry >> Oct 13, 2019 07:37 AM Doyce Loose D wrote: Reason for CRM: PT wants to know if she can be worked in her provider does not have any availability. Pt states that she has a bug bite on  left ankle states theres a knot and that its bruised. Pt states swelling and painful at night been experiencing this for 3 weeks

## 2019-10-13 NOTE — Progress Notes (Signed)
Established patient visit   Patient: Julia Stark   DOB: 1960/07/03   59 y.o. Female  MRN: [id number] Visit Date: 10/14/2019  Today's healthcare provider: Trinna Post, PA-C   Chief Complaint  Patient presents with  . Insect Bite   Subjective    HPI  Insect Bite Patient presents today with an insect bite on her left ankle. She reports that she has had symptoms for 3 weeks. She reports one has faded but one is new.      Medications: Outpatient Medications Prior to Visit  Medication Sig  . triamcinolone (KENALOG) 0.147 MG/GM topical spray Apply to aa's B/L arms BID until clear. Avoid f/g/a.  . triamcinolone cream (KENALOG) 0.1 % Apply 1 application topically as directed. Apply once to twice daily as needed  . valACYclovir (VALTREX) 500 MG tablet Take 1 tablet (500 mg total) by mouth daily.  Marland Kitchen venlafaxine XR (EFFEXOR XR) 75 MG 24 hr capsule Take 1 capsule (75 mg total) by mouth daily with breakfast.  . ALPRAZolam (XANAX) 0.25 MG tablet Take 0.25 mg by mouth daily as needed for anxiety or sleep.   Marland Kitchen amiodarone (PACERONE) 200 MG tablet Take 200 mg by mouth daily.   Marland Kitchen apixaban (ELIQUIS) 5 MG TABS tablet Take 5 mg by mouth 2 (two) times daily.  . fluticasone (FLONASE) 50 MCG/ACT nasal spray Place 2 sprays into both nostrils daily.  . Melatonin 5 MG TABS Take 5 mg by mouth at bedtime.   . Multiple Vitamin (MULTIVITAMIN) tablet Take 1 tablet by mouth at bedtime. One-A-Day Women's 50+  . Potassium 99 MG TABS Take 99 mg by mouth at bedtime.   . ramipril (ALTACE) 10 MG capsule Take 1 capsule (10 mg total) by mouth daily.  . traZODone (DESYREL) 50 MG tablet Take 0.5-1 tablets (25-50 mg total) by mouth at bedtime as needed for sleep. (Patient not taking: Reported on 10/14/2019)  . vitamin E 400 UNIT capsule Take 800 Units by mouth daily.  (Patient not taking: Reported on 10/14/2019)   No facility-administered medications prior to visit.    Review of Systems  Constitutional:  Negative for activity change, chills, fatigue, fever and unexpected weight change.  Musculoskeletal: Positive for myalgias.  Skin: Positive for color change and rash. Negative for pallor and wound.  Hematological: Bruises/bleeds easily.      Objective    BP (!) 146/81   Pulse 83   Temp 97.8 F (36.6 C)   Ht 5\' 7"  (1.702 m)   Wt 194 lb (88 kg)   LMP 04/13/2011   BMI 30.38 kg/m    Physical Exam Constitutional:      Appearance: Normal appearance.  Pulmonary:     Effort: Pulmonary effort is normal.  Musculoskeletal:     Comments: No deep vein tenderness in bilateral lower extremities.   Skin:    General: Skin is warm and dry.       Neurological:     Mental Status: [name] alert and oriented to person, place, and time. Mental status is at baseline.  Psychiatric:        Mood and Affect: Mood normal.        Behavior: Behavior normal.       No results found for any visits on 10/14/19.  Assessment & Plan    1. Insect bite of left ankle, initial encounter  Discussed self limiting nature of this. May do hydrocortisone cream for itching.    No follow-ups on file.  ITrinna Post, PA-C, have reviewed all documentation for this visit. The documentation on 10/17/19 for the exam, diagnosis, procedures, and orders are all accurate and complete.  I have spent 15 minutes with this patient, >50% of which was spent on counseling and coordination of care.     Paulene Floor  [facility] [phone] (phone) [phone] (fax)  [place]

## 2019-10-13 NOTE — Telephone Encounter (Signed)
Advised patient that there is no availability today for appts. Scheduled appt for tomorrow at 2pm with Adriana.

## 2019-10-14 ENCOUNTER — Encounter: Payer: Self-pay | Admitting: Physician Assistant

## 2019-10-14 ENCOUNTER — Other Ambulatory Visit: Payer: Self-pay

## 2019-10-14 ENCOUNTER — Ambulatory Visit: Payer: Managed Care, Other (non HMO) | Admitting: Physician Assistant

## 2019-10-14 VITALS — BP 146/81 | HR 83 | Temp 97.8°F | Ht 67.0 in | Wt 194.0 lb

## 2019-10-14 DIAGNOSIS — S90562A Insect bite (nonvenomous), left ankle, initial encounter: Secondary | ICD-10-CM | POA: Diagnosis not present

## 2019-10-14 DIAGNOSIS — W57XXXA Bitten or stung by nonvenomous insect and other nonvenomous arthropods, initial encounter: Secondary | ICD-10-CM | POA: Diagnosis not present

## 2019-10-17 NOTE — Patient Instructions (Signed)

## 2019-10-31 ENCOUNTER — Encounter: Payer: Self-pay | Admitting: Physician Assistant

## 2019-10-31 ENCOUNTER — Ambulatory Visit (INDEPENDENT_AMBULATORY_CARE_PROVIDER_SITE_OTHER): Payer: Managed Care, Other (non HMO) | Admitting: Physician Assistant

## 2019-10-31 VITALS — BP 120/80 | HR 84 | Ht 67.0 in | Wt 196.2 lb

## 2019-10-31 DIAGNOSIS — K5909 Other constipation: Secondary | ICD-10-CM | POA: Diagnosis not present

## 2019-10-31 DIAGNOSIS — R110 Nausea: Secondary | ICD-10-CM

## 2019-10-31 DIAGNOSIS — R1013 Epigastric pain: Secondary | ICD-10-CM

## 2019-10-31 DIAGNOSIS — Z86010 Personal history of colonic polyps: Secondary | ICD-10-CM | POA: Diagnosis not present

## 2019-10-31 NOTE — Patient Instructions (Signed)
If you are age 59 or older, your body mass index should be between 23-30. Your Body mass index is 30.73 kg/m. If this is out of the aforementioned range listed, please consider follow up with your Primary Care Provider.  If you are age 19 or younger, your body mass index should be between 19-25. Your Body mass index is 30.73 kg/m. If this is out of the aformentioned range listed, please consider follow up with your Primary Care Provider.   Try using Clearlax 1/2 dose per day.   Start Pepcid 20 mg nightly.   Colonoscopy due in January 2024.

## 2019-10-31 NOTE — Progress Notes (Signed)
Chief Complaint: Vomiting, constipation, history of colon polyps  HPI:    Julia Stark is a 59 year old Caucasian female with a past medical history as listed below including, previous cholecystectomy, A. fib on Eliquis and diabetes, previously seen by [facility] who was referred to me by Virginia Crews, MD for a complaint of constipation, vomiting and history of polyps.      04/09/2017 colonoscopy with internal hemorrhoids and otherwise normal.  Repeat recommended in 5 years for history of adenomatous polyps.    Today, the patient presents to clinic and tells me that really she just wants to establish care here.  Her brother sees Dr. Hilarie Fredrickson and their family knows him so she would like to be established with him.        Describes that she has a history of colon polyps.  Last colonoscopy was as above.      Also tells me she has chronic constipation for which she typically uses ClearLax.  She has had difficulty titrating though this though and tells me that if she uses it every day she has runny stools but if she skips a day in between then she has hard stools.  Has tried Linzess in the past but this is much too strong for her.    Also discusses some vague epigastric/right upper quadrant pain which comes and goes and may not even be there every week as well as some nighttime nausea.  Apparently was having some awakenings with vomiting for about a month when she is on cholesterol medicine, she stopped the cholesterol medicine and this stopped but she does continue with some nausea at times when she awakens in the middle of the night.    Denies fever, chills, weight loss or blood in her stool.  Past Medical History:  Diagnosis Date  . Anxiety   . Arthritis    bilateral shoulders  . Atrial fibrillation ([place])   . Cancer ([place])    BASAL CELL CARCINOMA OF FACE  . Depression   . Diabetes mellitus without complication ([place])   . Dysrhythmia    A fib  . Hyperlipidemia   . Hypertension   .  Sialoadenitis 12/24/2018    Past Surgical History:  Procedure Laterality Date  . BASAL CELL CARCINOMA EXCISION    . CARDIAC ELECTROPHYSIOLOGY MAPPING AND ABLATION  05/2019  . CARDIOVERSION N/A 03/04/2019   Procedure: CARDIOVERSION;  Surgeon: Corey Skains, MD;  Location: [facility] ORS;  Service: Cardiovascular;  Laterality: N/A;  . CARDIOVERSION N/A 06/11/2019   Procedure: CARDIOVERSION;  Surgeon: Corey Skains, MD;  Location: [facility] ORS;  Service: Cardiovascular;  Laterality: N/A;  . [place]  . CHOLECYSTECTOMY    . COLONOSCOPY  02/12/2012  . COLONOSCOPY WITH PROPOFOL N/A 04/09/2017   Procedure: COLONOSCOPY WITH PROPOFOL;  Surgeon: Manya Silvas, MD;  Location: [facility] ENDOSCOPY;  Service: Endoscopy;  Laterality: N/A;  . ELECTROPHYSIOLOGIC STUDY N/A 05/12/2015   Procedure: Cardioversion;  Surgeon: Corey Skains, MD;  Location: [facility] ORS;  Service: Cardiovascular;  Laterality: N/A;  . TUBAL LIGATION  1999    Current Outpatient Medications  Medication Sig Dispense Refill  . ALPRAZolam (XANAX) 0.25 MG tablet Take 0.25 mg by mouth daily as needed for anxiety or sleep.     Marland Kitchen apixaban (ELIQUIS) 5 MG TABS tablet Take 5 mg by mouth 2 (two) times daily.    . fluticasone (FLONASE) 50 MCG/ACT nasal spray Place 2 sprays into both nostrils daily. 48 g 1  .  Melatonin 5 MG TABS Take 5 mg by mouth at bedtime.     . Multiple Vitamin (MULTIVITAMIN) tablet Take 1 tablet by mouth at bedtime. One-A-Day Women's 50+    . ramipril (ALTACE) 10 MG capsule Take 1 capsule (10 mg total) by mouth daily. 90 capsule 1  . triamcinolone (KENALOG) 0.147 MG/GM topical spray Apply to aa's B/L arms BID until clear. Avoid f/g/a. 63 g 0  . triamcinolone cream (KENALOG) 0.1 % Apply 1 application topically as directed. Apply once to twice daily as needed 80 g 2  . valACYclovir (VALTREX) 500 MG tablet Take 1 tablet (500 mg total) by mouth daily. 90 tablet 1  . venlafaxine XR (EFFEXOR XR) 75 MG 24 hr capsule Take 1  capsule (75 mg total) by mouth daily with breakfast. 90 capsule 1   No current facility-administered medications for this visit.    Allergies as of 10/31/2019 - Review Complete 10/31/2019  Allergen Reaction Noted  . Flecainide  10/23/2019  . Rosuvastatin  08/22/2017    Family History  Problem Relation Age of Onset  . Diabetes Mother   . Hypertension Mother   . Skin cancer Mother   . Depression Mother   . Alzheimer's disease Father   . Colon cancer Maternal Uncle   . Hypertension Maternal Grandfather   . Hyperlipidemia Maternal Grandfather   . Diabetes Paternal Grandmother   . Heart disease Paternal Grandmother   . Prostate cancer Paternal Grandfather   . Ovarian cancer Cousin   . Diabetes Sister   . Depression Sister   . Diabetes Brother   . Breast cancer Neg Hx   . Cervical cancer Neg Hx     Social History   Socioeconomic History  . Marital status: Divorced    Spouse name: Not on file  . Number of children: 3  . Years of education: 29  . Highest education level: High school graduate  Occupational History    Employer: [employer]    Comment: in special chemistry  Tobacco Use  . Smoking status: Never Smoker  . Smokeless tobacco: Never Used  Vaping Use  . Vaping Use: Never used  Substance and Sexual Activity  . Alcohol use: Yes    Alcohol/week: 4.0 standard drinks    Types: 2 Glasses of wine, 2 Cans of beer per week  . Drug use: No  . Sexual activity: Yes    Partners: Male    Birth control/protection: Post-menopausal, Surgical  Other Topics Concern  . Not on file  Social History Narrative  . Not on file   Social Determinants of Health   Financial Resource Strain:   . Difficulty of Paying Living Expenses:   Food Insecurity:   . Worried About Charity fundraiser in the Last Year:   . Arboriculturist in the Last Year:   Transportation Needs:   . Film/video editor (Medical):   Marland Kitchen Lack of Transportation (Non-Medical):   Physical Activity:   . Days of  Exercise per Week:   . Minutes of Exercise per Session:   Stress:   . Feeling of Stress :   Social Connections:   . Frequency of Communication with Friends and Family:   . Frequency of Social Gatherings with Friends and Family:   . Attends Religious Services:   . Active Member of Clubs or Organizations:   . Attends Archivist Meetings:   Marland Kitchen Marital Status:   Intimate Partner Violence:   . Fear of Current or  Ex-Partner:   . Emotionally Abused:   Marland Kitchen Physically Abused:   . Sexually Abused:     Review of Systems:    Constitutional: No weight loss, fever or chills Skin: No rash  Cardiovascular: No chest pain Respiratory: No SOB  Gastrointestinal: See HPI and otherwise negative Genitourinary: No dysuria  Neurological: No headache, dizziness or syncope Musculoskeletal: No new muscle or joint pain Hematologic: No bleeding Psychiatric: No history of depression or anxiety   Physical Exam:  Vital signs: BP 120/80   Pulse 84   Ht 5\' 7"  (1.702 m)   Wt 196 lb 3.2 oz (89 kg)   LMP 04/13/2011   BMI 30.73 kg/m   Constitutional:   Pleasant Caucasian female appears to be in NAD, Well developed, Well nourished, alert and cooperative Head:  Normocephalic and atraumatic. Eyes:   PEERL, EOMI. No icterus. Conjunctiva pink. Ears:  Normal auditory acuity. Neck:  Supple Throat: Oral cavity and pharynx without inflammation, swelling or lesion.  Respiratory: Respirations even and unlabored. Lungs clear to auscultation bilaterally.   No wheezes, crackles, or rhonchi.  Cardiovascular: Normal S1, S2. No MRG. Regular rate and rhythm. No peripheral edema, cyanosis or pallor.  Gastrointestinal:  Soft, nondistended, nontender. No rebound or guarding. Normal bowel sounds. No appreciable masses or hepatomegaly. Rectal:  Not performed.  Msk:  Symmetrical without gross deformities. Without edema, no deformity or joint abnormality.  Neurologic:  Alert and  oriented x4;  grossly normal  neurologically.  Skin:   Dry and intact without significant lesions or rashes. Psychiatric: Demonstrates good judgement and reason without abnormal affect or behaviors.  No recent labs/imaging.  Assessment: 1.  History of adenomatous polyps: Last colonoscopy in 2019 with recommendations to repeat in 5 years 2.  Epigastric pain/nausea: Question whether this represents some mild gastritis, possibly initiated by her cholesterol medicine 3.  Chronic constipation: Better with ClearLax but she has found this hard to titrate  Plan: 1.  Recommend the patient try using a half a dose of ClearLax on a daily basis instead of skipping days.  She can titrate this as needed to a quarter of a dose or three quarters of a dose.  Discussed this in detail today. 2.  Discussed the epigastric pain and nausea.  Would recommend that she start Pepcid 20 mg nightly.  Discussed that if this helps but is not quite strong enough she can try using in the morning and at the night.  If she is using this consistently then we can send in a prescription for her.  Would like her to try it first though. 3.  Patient was placed in recall for colonoscopy with Dr. Hilarie Fredrickson in 2024.  She will be seen in our clinic as needed before then.  Ellouise Newer, PA-C [facility] 10/31/2019, 9:03 AM  Cc: Virginia Crews, MD

## 2019-11-11 ENCOUNTER — Encounter: Payer: Self-pay | Admitting: Family Medicine

## 2019-11-11 NOTE — Progress Notes (Signed)
Addendum: Reviewed and agree with assessment and management plan. Piero Mustard M, MD  

## 2019-11-13 ENCOUNTER — Encounter: Payer: Self-pay | Admitting: Family Medicine

## 2019-11-14 MED ORDER — ALPRAZOLAM 0.25 MG PO TABS: 0.2500 mg | ORAL_TABLET | Freq: Every day | ORAL | 0 refills | Status: DC | PRN

## 2019-11-17 ENCOUNTER — Ambulatory Visit: Payer: Managed Care, Other (non HMO) | Admitting: Family Medicine

## 2019-12-04 ENCOUNTER — Other Ambulatory Visit: Payer: Self-pay

## 2019-12-04 ENCOUNTER — Encounter: Payer: Self-pay | Admitting: Family Medicine

## 2019-12-04 ENCOUNTER — Ambulatory Visit: Payer: Managed Care, Other (non HMO) | Admitting: Family Medicine

## 2019-12-04 VITALS — BP 117/75 | HR 85 | Temp 98.1°F | Resp 16 | Wt 197.0 lb

## 2019-12-04 DIAGNOSIS — E669 Obesity, unspecified: Secondary | ICD-10-CM | POA: Insufficient documentation

## 2019-12-04 DIAGNOSIS — K5909 Other constipation: Secondary | ICD-10-CM | POA: Diagnosis not present

## 2019-12-04 DIAGNOSIS — E785 Hyperlipidemia, unspecified: Secondary | ICD-10-CM

## 2019-12-04 DIAGNOSIS — E663 Overweight: Secondary | ICD-10-CM | POA: Insufficient documentation

## 2019-12-04 DIAGNOSIS — E1169 Type 2 diabetes mellitus with other specified complication: Secondary | ICD-10-CM | POA: Diagnosis not present

## 2019-12-04 MED ORDER — LINACLOTIDE 72 MCG PO CAPS: 72.0000 ug | ORAL_CAPSULE | Freq: Every day | ORAL | 3 refills | Status: DC

## 2019-12-04 MED ORDER — ROSUVASTATIN CALCIUM 5 MG PO TABS: 5.0000 mg | ORAL_TABLET | Freq: Every day | ORAL | 1 refills | Status: DC

## 2019-12-04 NOTE — Assessment & Plan Note (Addendum)
Chronic-longstanding issue Advised on increase fiber diet Failed daily MiraLAX Start Linzess 72 MCG May increase dose if 72 MCG not helping Reviewed last colonoscopy-normal Follow up in 1 month

## 2019-12-04 NOTE — Progress Notes (Signed)
Established patient visit   Patient: Julia Stark   DOB: [birth year]   59 y.o. Female  MRN: [id number] Visit Date: 12/04/2019  Today's healthcare provider: Lavon Paganini, MD   I,Sulibeya S Dimas,acting as a scribe for Lavon Paganini, MD.,have documented all relevant documentation on the behalf of Lavon Paganini, MD,as directed by  Lavon Paganini, MD while in the presence of Lavon Paganini, MD.  Chief Complaint  Patient presents with  . Constipation   Subjective    Constipation This is a recurrent problem. The problem has been waxing and waning since onset. Her stool frequency is 1 time per day. The stool is described as firm and pellet like. The patient is on a high fiber diet. She exercises regularly. There has been adequate water intake. Associated symptoms include abdominal pain, bloating, a fever (sunday night 99), hemorrhoids and nausea. Pertinent negatives include no difficulty urinating, hematochezia, melena or vomiting.     Lipid/Cholesterol, Follow-up  Last lipid panel Other pertinent labs  Lab Results  Component Value Date   CHOL 151 07/01/2019   HDL 47 07/01/2019   LDLCALC 78 07/01/2019   TRIG 153 (H) 07/01/2019   CHOLHDL 3.2 07/01/2019   Lab Results  Component Value Date   ALT 25 12/23/2018   AST 27 12/23/2018   PLT 193 12/23/2018   TSH 0.92 06/12/2014     Did not tolerate Atorvastatin due ot GI updet.  Previously on pravastatin without sufficient control of LDL.  Previous history of myalgias on Crestor, but states this did not change after stopping it and would like to try it again.   The 10-year ASCVD risk score Mikey Bussing DC Brooke Bonito., et al., 2013) is: 5.7%  ---------------------------------------------------------------------------------------------------   Patient Active Problem List   Diagnosis Date Noted  . Morbid obesity ([place]) 12/04/2019  . Basal cell carcinoma of face 10/03/2019  . Typical atrial flutter ([place]) 08/15/2019  .  Bursitis of left hip 08/04/2019  . Lipoma of left upper extremity 08/04/2019  . Primary insomnia 06/23/2019  . Red blood cell antibody positive 05/22/2019  . Chronic anticoagulation 06/19/2018  . Affective disorder, major 08/22/2017  . Allergic rhinitis 08/22/2017  . Anxiety, generalized 08/22/2017  . T2DM (type 2 diabetes mellitus) ([place]) 08/22/2017  . Essential hypertension 08/22/2017  . Hyperlipidemia associated with type 2 diabetes mellitus ([place]) 08/22/2017  . Mobitz type 2 second degree atrioventricular block 02/05/2017  . Paroxysmal A-fib ([place]) 05/25/2015  . Recurrent genital HSV (herpes simplex virus) infection 03/13/2014  . Chronic constipation 02/17/2014  . Reflux gastritis 02/17/2014  . Adenomatous polyp of descending colon 02/12/2012   Social History   Tobacco Use  . Smoking status: Never Smoker  . Smokeless tobacco: Never Used  Vaping Use  . Vaping Use: Never used  Substance Use Topics  . Alcohol use: Yes    Alcohol/week: 4.0 standard drinks    Types: 2 Glasses of wine, 2 Cans of beer per week  . Drug use: No   Allergies  Allergen Reactions  . Flecainide     Other reaction(s): Hallucination, Vomiting  . Rosuvastatin     muscle cramps       Medications: Outpatient Medications Prior to Visit  Medication Sig  . ALPRAZolam (XANAX) 0.25 MG tablet Take 1 tablet (0.25 mg total) by mouth daily as needed for anxiety or sleep.  Marland Kitchen apixaban (ELIQUIS) 5 MG TABS tablet Take 5 mg by mouth 2 (two) times daily.  Marland Kitchen diltiazem (TIAZAC) 120 MG 24 hr capsule Take  1 capsule by mouth in the morning.  Noelle Penner FIBER SUPPLEMENT PO Take by mouth.  . fluticasone (FLONASE) 50 MCG/ACT nasal spray Place 2 sprays into both nostrils daily.  . Melatonin 5 MG TABS Take 5 mg by mouth at bedtime.   . metFORMIN (GLUCOPHAGE) 1000 MG tablet Take 1,000 mg by mouth daily.  . Multiple Vitamin (MULTIVITAMIN) tablet Take 1 tablet by mouth at bedtime. One-A-Day Women's 50+  . polyethylene glycol  (MIRALAX) 17 g packet Take 17 g by mouth daily.  . ramipril (ALTACE) 10 MG capsule Take 1 capsule (10 mg total) by mouth daily.  . valACYclovir (VALTREX) 500 MG tablet Take 1 tablet (500 mg total) by mouth daily.  Marland Kitchen venlafaxine XR (EFFEXOR XR) 75 MG 24 hr capsule Take 1 capsule (75 mg total) by mouth daily with breakfast.  . triamcinolone (KENALOG) 0.147 MG/GM topical spray Apply to aa's B/L arms BID until clear. Avoid f/g/a. (Patient not taking: Reported on 12/04/2019)  . triamcinolone cream (KENALOG) 0.1 % Apply 1 application topically as directed. Apply once to twice daily as needed (Patient not taking: Reported on 12/04/2019)   No facility-administered medications prior to visit.    Review of Systems  Constitutional: Positive for fever (sunday night 99). Negative for chills.  Respiratory: Negative for chest tightness and shortness of breath.   Cardiovascular: Negative for chest pain and palpitations.  Gastrointestinal: Positive for abdominal pain, bloating, constipation, hemorrhoids and nausea. Negative for hematochezia, melena and vomiting.  Genitourinary: Negative for difficulty urinating.      Objective    BP 117/75 (BP Location: Left Arm, Patient Position: Sitting, Cuff Size: Normal)   Pulse 85   Temp 98.1 F (36.7 C) (Oral)   Resp 16   Wt 197 lb (89.4 kg)   LMP 04/13/2011   BMI 30.85 kg/m  BP Readings from Last 3 Encounters:  12/04/19 117/75  10/31/19 120/80  10/14/19 (!) 146/81   Wt Readings from Last 3 Encounters:  12/04/19 197 lb (89.4 kg)  10/31/19 196 lb 3.2 oz (89 kg)  10/14/19 194 lb (88 kg)      Physical Exam Vitals reviewed.  Constitutional:      General: [name] not in acute distress.    Appearance: Normal appearance. [name] well-developed. [name] not diaphoretic.  HENT:     Head: Normocephalic and atraumatic.  Eyes:     General: No scleral icterus.    Conjunctiva/sclera: Conjunctivae normal.  Neck:     Thyroid: No thyromegaly.  Cardiovascular:      Rate and Rhythm: Normal rate and regular rhythm.     Pulses: Normal pulses.     Heart sounds: Normal heart sounds. No murmur heard.   Pulmonary:     Effort: Pulmonary effort is normal. No respiratory distress.     Breath sounds: Normal breath sounds. No wheezing, rhonchi or rales.  Musculoskeletal:     Cervical back: Neck supple.     Right lower leg: No edema.     Left lower leg: No edema.  Lymphadenopathy:     Cervical: No cervical adenopathy.  Skin:    General: Skin is warm and dry.     Findings: No rash.  Neurological:     Mental Status: [name] alert and oriented to person, place, and time. Mental status is at baseline.  Psychiatric:        Mood and Affect: Mood normal.        Behavior: Behavior normal.     No  results found for any visits on 12/04/19.  Assessment & Plan     Problem List Items Addressed This Visit      Digestive   Chronic constipation - Primary    Chronic-longstanding issue Advised on increase fiber diet Failed daily MiraLAX Start Linzess 72 MCG May increase dose if 29 MCG not helping Reviewed last colonoscopy-normal Follow up in 1 month      Relevant Medications   EQ FIBER SUPPLEMENT PO   polyethylene glycol (MIRALAX) 17 g packet   linaclotide (LINZESS) 72 MCG capsule     Endocrine   Hyperlipidemia associated with type 2 diabetes mellitus ([place])    Also followed by cardiology Has a remote history of not tolerating Crestor due to myalgias Did not tolerate atorvastatin due to GI upset Previously tolerated pravastatin well, however Pravastatin at max dose did not reduce LDL sufficiently Goal LDL less than 70 in setting of diabetes Willing to try Crestor again We will start at low-dose of 5 mg daily Could consider less frequent dosing if needed Take with co-Q10 to reduce incidence of myalgia Plan to recheck FLP and CMP in about 3 months Follow-up in 1 month to see how she is tolerating      Relevant Medications   rosuvastatin (CRESTOR) 5  MG tablet     Other   Morbid obesity ([place])    Discussed importance of healthy weight management Discussed diet and exercise           Return in about 4 weeks (around 01/01/2020) for chronic disease f/u.      I, Lavon Paganini, MD, have reviewed all documentation for this visit. The documentation on 12/04/19 for the exam, diagnosis, procedures, and orders are all accurate and complete.   Anyah Swallow, Dionne Bucy, MD, MPH [facility]

## 2019-12-04 NOTE — Assessment & Plan Note (Signed)
Discussed importance of healthy weight management Discussed diet and exercise  

## 2019-12-04 NOTE — Patient Instructions (Addendum)
Take CoQ 10 with Crestor.  Constipation, Adult Constipation is when a person:  Poops (has a bowel movement) fewer times in a week than normal.  Has a hard time pooping.  Has poop that is dry, hard, or bigger than normal. Follow these instructions at home: Eating and drinking   Eat foods that have a lot of fiber, such as: ? Fresh fruits and vegetables. ? Whole grains. ? Beans.  Eat less of foods that are high in fat, low in fiber, or overly processed, such as: ? [place] fries. ? Hamburgers. ? Cookies. ? Candy. ? Soda.  Drink enough fluid to keep your pee (urine) clear or pale yellow. General instructions  Exercise regularly or as told by your doctor.  Go to the restroom when you feel like you need to poop. Do not hold it in.  Take over-the-counter and prescription medicines only as told by your doctor. These include any fiber supplements.  Do pelvic floor retraining exercises, such as: ? Doing deep breathing while relaxing your lower belly (abdomen). ? Relaxing your pelvic floor while pooping.  Watch your condition for any changes.  Keep all follow-up visits as told by your doctor. This is important. Contact a doctor if:  You have pain that gets worse.  You have a fever.  You have not pooped for 4 days.  You throw up (vomit).  You are not hungry.  You lose weight.  You are bleeding from the anus.  You have thin, pencil-like poop (stool). Get help right away if:  You have a fever, and your symptoms suddenly get worse.  You leak poop or have blood in your poop.  Your belly feels hard or bigger than normal (is bloated).  You have very bad belly pain.  You feel dizzy or you faint. This information is not intended to replace advice given to you by your health care provider. Make sure you discuss any questions you have with your health care provider. Document Revised: 03/02/2017 Document Reviewed: 09/08/2015 Elsevier Patient Education  2020 [employer].

## 2019-12-04 NOTE — Assessment & Plan Note (Signed)
Also followed by cardiology Has a remote history of not tolerating Crestor due to myalgias Did not tolerate atorvastatin due to GI upset Previously tolerated pravastatin well, however Pravastatin at max dose did not reduce LDL sufficiently Goal LDL less than 70 in setting of diabetes Willing to try Crestor again We will start at low-dose of 5 mg daily Could consider less frequent dosing if needed Take with co-Q10 to reduce incidence of myalgia Plan to recheck FLP and CMP in about 3 months Follow-up in 1 month to see how she is tolerating

## 2019-12-05 ENCOUNTER — Telehealth: Payer: Self-pay

## 2019-12-05 MED ORDER — LUBIPROSTONE 24 MCG PO CAPS: 24.0000 ug | ORAL_CAPSULE | Freq: Two times a day (BID) | ORAL | 3 refills | Status: DC

## 2019-12-05 NOTE — Telephone Encounter (Signed)
PT returning the call, she ask if the new medication can be send to the pharmacy

## 2019-12-05 NOTE — Telephone Encounter (Signed)
Linzess is not covered patient's insurance. LMTCB to try Amitiza 24 mcg BID.

## 2019-12-10 ENCOUNTER — Telehealth: Payer: Self-pay

## 2019-12-10 NOTE — Telephone Encounter (Signed)
Noted  

## 2019-12-10 NOTE — Telephone Encounter (Signed)
Linzessa 55mcg is approved for patient. Amatiza PA was not done. Called [facility] to let them know.

## 2019-12-29 ENCOUNTER — Other Ambulatory Visit: Payer: Self-pay

## 2019-12-29 ENCOUNTER — Encounter: Payer: Self-pay | Admitting: Family Medicine

## 2019-12-29 ENCOUNTER — Ambulatory Visit (INDEPENDENT_AMBULATORY_CARE_PROVIDER_SITE_OTHER): Payer: Managed Care, Other (non HMO) | Admitting: Family Medicine

## 2019-12-29 VITALS — BP 130/76 | HR 80 | Temp 98.4°F | Ht 67.0 in | Wt 200.0 lb

## 2019-12-29 DIAGNOSIS — I10 Essential (primary) hypertension: Secondary | ICD-10-CM | POA: Diagnosis not present

## 2019-12-29 DIAGNOSIS — I480 Paroxysmal atrial fibrillation: Secondary | ICD-10-CM

## 2019-12-29 DIAGNOSIS — E785 Hyperlipidemia, unspecified: Secondary | ICD-10-CM

## 2019-12-29 DIAGNOSIS — Z23 Encounter for immunization: Secondary | ICD-10-CM | POA: Diagnosis not present

## 2019-12-29 DIAGNOSIS — Z0000 Encounter for general adult medical examination without abnormal findings: Secondary | ICD-10-CM | POA: Diagnosis not present

## 2019-12-29 DIAGNOSIS — Z7901 Long term (current) use of anticoagulants: Secondary | ICD-10-CM

## 2019-12-29 DIAGNOSIS — F331 Major depressive disorder, recurrent, moderate: Secondary | ICD-10-CM

## 2019-12-29 DIAGNOSIS — K5909 Other constipation: Secondary | ICD-10-CM

## 2019-12-29 DIAGNOSIS — E1169 Type 2 diabetes mellitus with other specified complication: Secondary | ICD-10-CM

## 2019-12-29 LAB — HEMOGLOBIN A1C: Hemoglobin A1C: 6.1

## 2019-12-29 LAB — LIPID PANEL
Cholesterol: 234 — AB (ref 0–200)
HDL: 53 (ref 35–70)
LDL Cholesterol: 153
Triglycerides: 161 — AB (ref 40–160)

## 2019-12-29 LAB — BASIC METABOLIC PANEL: Glucose: 123

## 2019-12-29 MED ORDER — LINACLOTIDE 72 MCG PO CAPS: 72.0000 ug | ORAL_CAPSULE | Freq: Every day | ORAL | 1 refills | Status: DC

## 2019-12-29 NOTE — Assessment & Plan Note (Signed)
Followed by Cardiology S/p ablation and cardioversion Continue Eliquis and diltiazem In normal sinus rhythm today Recheck CBC

## 2019-12-29 NOTE — Assessment & Plan Note (Signed)
Well controlled Continue current medications Recheck metabolic panel F/u in 6 months  

## 2019-12-29 NOTE — Assessment & Plan Note (Signed)
Chronic and longstanding issue Continue high-fiber diet Failed daily MiraLAX Having diarrhea after starting Linzess 72 mcg, lowest dose She will try Linzess 72 mcg every other day for the next week to 2 months see if she can increase to daily Avoid other stool softeners/laxatives while taking this Reviewed last colonoscopy Follow-up in 2 to 3 months

## 2019-12-29 NOTE — Patient Instructions (Signed)

## 2019-12-29 NOTE — Progress Notes (Signed)
Complete physical exam   Patient: Julia Stark   DOB: 10/26/1960   59 y.o. Female  MRN: [id number] Visit Date: 12/29/2019  Today's healthcare provider: Lavon Paganini, MD   Chief Complaint  Patient presents with  . Annual Exam   Subjective    Julia Stark is a 59 y.o. female who presents today for a complete physical exam.  She reports consuming a general diet. Exercises some.  Walks her dog She generally feels well. She reports sleeping fairly well. She does have additional problems to discuss today.  HPI    Past Medical History:  Diagnosis Date  . Anxiety   . Arthritis    bilateral shoulders  . Atrial fibrillation ([place])   . Cancer ([place])    BASAL CELL CARCINOMA OF FACE  . Colon polyp   . Depression   . Diabetes mellitus without complication ([place])   . Dysrhythmia    A fib  . Hyperlipidemia   . Hypertension   . Sialoadenitis 12/24/2018   Past Surgical History:  Procedure Laterality Date  . BASAL CELL CARCINOMA EXCISION    . CARDIAC ELECTROPHYSIOLOGY MAPPING AND ABLATION  05/2019  . CARDIOVERSION N/A 03/04/2019   Procedure: CARDIOVERSION;  Surgeon: Corey Skains, MD;  Location: [facility] ORS;  Service: Cardiovascular;  Laterality: N/A;  . CARDIOVERSION N/A 06/11/2019   Procedure: CARDIOVERSION;  Surgeon: Corey Skains, MD;  Location: [facility] ORS;  Service: Cardiovascular;  Laterality: N/A;  . [place]  . CHOLECYSTECTOMY    . COLONOSCOPY  02/12/2012  . COLONOSCOPY WITH PROPOFOL N/A 04/09/2017   Procedure: COLONOSCOPY WITH PROPOFOL;  Surgeon: Manya Silvas, MD;  Location: [facility] ENDOSCOPY;  Service: Endoscopy;  Laterality: N/A;  . ELECTROPHYSIOLOGIC STUDY N/A 05/12/2015   Procedure: Cardioversion;  Surgeon: Corey Skains, MD;  Location: [facility] ORS;  Service: Cardiovascular;  Laterality: N/A;  . TUBAL LIGATION  1999   Social History   Socioeconomic History  . Marital status: Divorced    Spouse name: Not on file  . Number of children: 3  .  Years of education: 61  . Highest education level: High school graduate  Occupational History    Employer: [employer]    Comment: in special chemistry  Tobacco Use  . Smoking status: Never Smoker  . Smokeless tobacco: Never Used  Vaping Use  . Vaping Use: Never used  Substance and Sexual Activity  . Alcohol use: Yes    Alcohol/week: 4.0 standard drinks    Types: 2 Glasses of wine, 2 Cans of beer per week  . Drug use: No  . Sexual activity: Yes    Partners: Male    Birth control/protection: Post-menopausal, Surgical  Other Topics Concern  . Not on file  Social History Narrative  . Not on file   Social Determinants of Health   Financial Resource Strain:   . Difficulty of Paying Living Expenses: Not on file  Food Insecurity:   . Worried About Charity fundraiser in the Last Year: Not on file  . Ran Out of Food in the Last Year: Not on file  Transportation Needs:   . Lack of Transportation (Medical): Not on file  . Lack of Transportation (Non-Medical): Not on file  Physical Activity:   . Days of Exercise per Week: Not on file  . Minutes of Exercise per Session: Not on file  Stress:   . Feeling of Stress : Not on file  Social Connections:   . Frequency  of Communication with Friends and Family: Not on file  . Frequency of Social Gatherings with Friends and Family: Not on file  . Attends Religious Services: Not on file  . Active Member of Clubs or Organizations: Not on file  . Attends Archivist Meetings: Not on file  . Marital Status: Not on file  Intimate Partner Violence:   . Fear of Current or Ex-Partner: Not on file  . Emotionally Abused: Not on file  . Physically Abused: Not on file  . Sexually Abused: Not on file   Family Status  Relation Name Status  . Mother  (Not Specified)  . Father  (Not Specified)  . Mat Uncle  (Not Specified)  . MGF  (Not Specified)  . PGM  (Not Specified)  . PGF  (Not Specified)  . Cousin  (Not Specified)  . Sister  Alive    . Brother  Alive  . Neg Hx  (Not Specified)   Family History  Problem Relation Age of Onset  . Diabetes Mother   . Hypertension Mother   . Skin cancer Mother   . Depression Mother   . Alzheimer's disease Father   . Colon cancer Maternal Uncle   . Hypertension Maternal Grandfather   . Hyperlipidemia Maternal Grandfather   . Diabetes Paternal Grandmother   . Heart disease Paternal Grandmother   . Prostate cancer Paternal Grandfather   . Ovarian cancer Cousin   . Diabetes Sister   . Depression Sister   . Diabetes Brother   . Breast cancer Neg Hx   . Cervical cancer Neg Hx    Allergies  Allergen Reactions  . Flecainide     Other reaction(s): Hallucination, Vomiting  . Rosuvastatin     muscle cramps    Patient Care Team: Virginia Crews, MD as PCP - General (Family Medicine)   Medications: Outpatient Medications Prior to Visit  Medication Sig  . apixaban (ELIQUIS) 5 MG TABS tablet Take 5 mg by mouth 2 (two) times daily.  Marland Kitchen diltiazem (TIAZAC) 120 MG 24 hr capsule Take 1 capsule by mouth in the morning.  Noelle Penner FIBER SUPPLEMENT PO Take by mouth.  . fluticasone (FLONASE) 50 MCG/ACT nasal spray Place 2 sprays into both nostrils daily.  . metFORMIN (GLUCOPHAGE) 1000 MG tablet Take 1,000 mg by mouth daily.  . Multiple Vitamin (MULTIVITAMIN) tablet Take 1 tablet by mouth at bedtime. One-A-Day Women's 50+  . ramipril (ALTACE) 10 MG capsule Take 1 capsule (10 mg total) by mouth daily.  . rosuvastatin (CRESTOR) 5 MG tablet Take 1 tablet (5 mg total) by mouth daily.  . valACYclovir (VALTREX) 500 MG tablet Take 1 tablet (500 mg total) by mouth daily.  Marland Kitchen venlafaxine XR (EFFEXOR XR) 75 MG 24 hr capsule Take 1 capsule (75 mg total) by mouth daily with breakfast.  . [DISCONTINUED] ALPRAZolam (XANAX) 0.25 MG tablet Take 1 tablet (0.25 mg total) by mouth daily as needed for anxiety or sleep.  . [DISCONTINUED] lubiprostone (AMITIZA) 24 MCG capsule Take 1 capsule (24 mcg total) by mouth 2  (two) times daily with a meal.  . [DISCONTINUED] Melatonin 5 MG TABS Take 5 mg by mouth at bedtime.   . [DISCONTINUED] polyethylene glycol (MIRALAX) 17 g packet Take 17 g by mouth daily.   No facility-administered medications prior to visit.    Review of Systems  Constitutional: Negative.   HENT: Positive for sinus pressure. Negative for congestion, dental problem, drooling, ear discharge, ear pain, facial swelling, hearing loss,  mouth sores, nosebleeds, postnasal drip, rhinorrhea, sinus pain, sneezing, sore throat, tinnitus, trouble swallowing and voice change.   Eyes: Negative.   Respiratory: Negative.   Cardiovascular: Negative.   Gastrointestinal: Positive for abdominal distention, abdominal pain, constipation and diarrhea. Negative for anal bleeding, blood in stool, nausea, rectal pain and vomiting.  Endocrine: Negative.   Genitourinary: Negative.   Musculoskeletal: Positive for arthralgias. Negative for back pain, gait problem, joint swelling, myalgias, neck pain and neck stiffness.  Skin: Negative.   Allergic/Immunologic: Positive for environmental allergies. Negative for food allergies and immunocompromised state.  Neurological: Positive for headaches. Negative for dizziness, tremors, seizures, syncope, facial asymmetry, speech difficulty, weakness, light-headedness and numbness.  Hematological: Negative.   Psychiatric/Behavioral: Negative.       Objective    BP 130/76 (BP Location: Right Arm, Patient Position: Sitting, Cuff Size: Large)   Pulse 80   Temp 98.4 F (36.9 C) (Oral)   Ht 5\' 7"  (1.702 m)   Wt 200 lb (90.7 kg)   LMP 04/13/2011   BMI 31.32 kg/m    Physical Exam Vitals reviewed.  Constitutional:      General: [name] not in acute distress.    Appearance: Normal appearance. [name] well-developed. [name] not diaphoretic.  HENT:     Head: Normocephalic and atraumatic.     Right Ear: Tympanic membrane, ear canal and external ear normal.     Left Ear: Tympanic  membrane, ear canal and external ear normal.  Eyes:     General: No scleral icterus.    Conjunctiva/sclera: Conjunctivae normal.     Pupils: Pupils are equal, round, and reactive to light.  Neck:     Thyroid: No thyromegaly.  Cardiovascular:     Rate and Rhythm: Normal rate and regular rhythm.     Pulses: Normal pulses.     Heart sounds: Normal heart sounds. No murmur heard.   Pulmonary:     Effort: Pulmonary effort is normal. No respiratory distress.     Breath sounds: Normal breath sounds. No wheezing or rales.  Abdominal:     General: There is no distension.     Palpations: Abdomen is soft.     Tenderness: There is no abdominal tenderness.  Musculoskeletal:        General: No deformity.     Cervical back: Neck supple.     Right lower leg: No edema.     Left lower leg: No edema.  Lymphadenopathy:     Cervical: No cervical adenopathy.  Skin:    General: Skin is warm and dry.     Findings: No rash.  Neurological:     Mental Status: [name] alert and oriented to person, place, and time. Mental status is at baseline.     Sensory: No sensory deficit.     Motor: No weakness.     Gait: Gait normal.  Psychiatric:        Mood and Affect: Mood normal.        Behavior: Behavior normal.        Thought Content: Thought content normal.       Last depression screening scores PHQ 2/9 Scores 12/29/2019 08/04/2019 12/23/2018  PHQ - 2 Score 0 1 0  PHQ- 9 Score 2 7 3    Last fall risk screening Fall Risk  12/29/2019  Falls in the past year? 0  Number falls in past yr: 0  Injury with Fall? 0  Risk for fall due to : No Fall Risks  Follow  up Falls evaluation completed   Last Audit-C alcohol use screening Alcohol Use Disorder Test (AUDIT) 12/29/2019  1. How often do you have a drink containing alcohol? 3  2. How many drinks containing alcohol do you have on a typical day when you are drinking? 0  3. How often do you have six or more drinks on one occasion? 0  AUDIT-C Score 3  4. How  often during the last year have you found that you were not able to stop drinking once you had started? -  5. How often during the last year have you failed to do what was normally expected from you because of drinking? -  6. How often during the last year have you needed a first drink in the morning to get yourself going after a heavy drinking session? -  7. How often during the last year have you had a feeling of guilt of remorse after drinking? -  8. How often during the last year have you been unable to remember what happened the night before because you had been drinking? -  9. Have you or someone else been injured as a result of your drinking? -  10. Has a relative or friend or a doctor or another health worker been concerned about your drinking or suggested you cut down? -  Alcohol Use Disorder Identification Test Final Score (AUDIT) -  Alcohol Brief Interventions/Follow-up AUDIT Score <7 follow-up not indicated   A score of 3 or more in women, and 4 or more in men indicates increased risk for alcohol abuse, EXCEPT if all of the points are from question 1   Results for orders placed or performed in visit on 12/29/19  Comprehensive metabolic panel  Result Value Ref Range   Glucose 188 (H) 65 - 99 mg/dL   BUN 12 6 - 24 mg/dL   Creatinine, Ser 0.67 0.57 - 1.00 mg/dL   GFR calc non Af Amer 97 >59 mL/min/1.73   GFR calc Af Amer 111 >59 mL/min/1.73   BUN/Creatinine Ratio 18 9 - 23   Sodium 137 134 - 144 mmol/L   Potassium 4.0 3.5 - 5.2 mmol/L   Chloride 100 96 - 106 mmol/L   CO2 25 20 - 29 mmol/L   Calcium 9.3 8.7 - 10.2 mg/dL   Total Protein 7.4 6.0 - 8.5 g/dL   Albumin 4.2 3.8 - 4.9 g/dL   Globulin, Total 3.2 1.5 - 4.5 g/dL   Albumin/Globulin Ratio 1.3 1.2 - 2.2   Bilirubin Total 0.4 0.0 - 1.2 mg/dL   Alkaline Phosphatase 104 44 - 121 IU/L   AST 40 0 - 40 IU/L   ALT 44 (H) 0 - 32 IU/L  CBC w/Diff/Platelet  Result Value Ref Range   WBC 7.3 3.4 - 10.8 x10E3/uL   RBC 4.09 3.77 -  5.28 x10E6/uL   Hemoglobin 12.8 11.1 - 15.9 g/dL   Hematocrit 38.2 34.0 - 46.6 %   MCV 93 79 - 97 fL   MCH 31.3 26.6 - 33.0 pg   MCHC 33.5 31 - 35 g/dL   RDW 12.3 11.7 - 15.4 %   Platelets 162 150 - 450 x10E3/uL   Neutrophils 52 Not Estab. %   Lymphs 35 Not Estab. %   Monocytes 10 Not Estab. %   Eos 3 Not Estab. %   Basos 0 Not Estab. %   Neutrophils Absolute 3.8 1 - 7 x10E3/uL   Lymphocytes Absolute 2.5 0 - 3 x10E3/uL   Monocytes  Absolute 0.7 0 - 0 x10E3/uL   EOS (ABSOLUTE) 0.2 0.0 - 0.4 x10E3/uL   Basophils Absolute 0.0 0 - 0 x10E3/uL   Immature Granulocytes 0 Not Estab. %   Immature Grans (Abs) 0.0 0.0 - 0.1 x10E3/uL    Assessment & Plan    Routine Health Maintenance and Physical Exam  Exercise Activities and Dietary recommendations Goals   None     Immunization History  Administered Date(s) Administered  . Influenza,inj,Quad PF,6+ Mos 01/17/2017, 02/05/2019, 12/29/2019  . PFIZER SARS-COV-2 Vaccination 05/08/2019, 05/29/2019  . Pneumococcal Polysaccharide-23 11/28/2011  . Tdap 03/13/2014    Health Maintenance  Topic Date Due  . HEMOGLOBIN A1C  12/24/2019  . OPHTHALMOLOGY EXAM  02/20/2020  . MAMMOGRAM  06/13/2020  . FOOT EXAM  06/22/2020  . TETANUS/TDAP  03/13/2024  . PAP SMEAR-Modifier  10/02/2024  . COLONOSCOPY  04/10/2027  . INFLUENZA VACCINE  Completed  . PNEUMOCOCCAL POLYSACCHARIDE VACCINE AGE 69-64 HIGH RISK  Completed  . COVID-19 Vaccine  Completed  . Hepatitis C Screening  Completed  . HIV Screening  Completed    Discussed health benefits of physical activity, and encouraged her to engage in regular exercise appropriate for her age and condition.  Problem List Items Addressed This Visit      Cardiovascular and Mediastinum   Essential hypertension    Well controlled Continue current medications Recheck metabolic panel F/u in 6 months       Relevant Orders   Comprehensive metabolic panel (Completed)   Paroxysmal A-fib ([place])    Followed by  Cardiology S/p ablation and cardioversion Continue Eliquis and diltiazem In normal sinus rhythm today Recheck CBC      Relevant Orders   CBC w/Diff/Platelet (Completed)     Digestive   Chronic constipation    Chronic and longstanding issue Continue high-fiber diet Failed daily MiraLAX Having diarrhea after starting Linzess 72 mcg, lowest dose She will try Linzess 72 mcg every other day for the next week to 2 months see if she can increase to daily Avoid other stool softeners/laxatives while taking this Reviewed last colonoscopy Follow-up in 2 to 3 months        Endocrine   T2DM (type 2 diabetes mellitus) ([place])    Well-controlled Continue current medications Up-to-date on screenings and vaccinations On ACE inhibitor and statin Discussed diet and exercise Follow-up in 6 months      Hyperlipidemia associated with type 2 diabetes mellitus ([place])    Previously not at goal Also followed by cardiology Remote history not tolerating Crestor due to myalgias Did not tolerate atorvastatin due to GI upset Previously tolerated pravastatin well, however pravastatin at max dose did not reduce LDL sufficiently Goal LDL less than 70 in the setting of diabetes Tolerating low-dose Crestor, which we will continue at current dose Continue co-Q10 to reduce incidence of myalgia Recheck FLP and CMP      Relevant Orders   Comprehensive metabolic panel (Completed)     Other   Chronic anticoagulation    On Eliquis for A. fib Recheck CBC      Relevant Orders   CBC w/Diff/Platelet (Completed)   Morbid obesity ([place])    Discussed importance of healthy weight management Discussed diet and exercise       Moderate episode of recurrent major depressive disorder ([facility])    Chronic and stable Continue Effexor at current dose       Other Visit Diagnoses    Encounter for annual physical exam    -  Primary   Relevant Orders   Comprehensive metabolic panel (Completed)   CBC w/Diff/Platelet  (Completed)   Need for influenza vaccination       Relevant Orders   Flu Vaccine QUAD 6+ mos PF IM (Fluarix Quad PF) (Completed)       Return in about 3 months (around 03/29/2020) for chronic disease f/u.     I, Lavon Paganini, MD, have reviewed all documentation for this visit. The documentation on 12/30/19 for the exam, diagnosis, procedures, and orders are all accurate and complete.   Lavanna Rog, Dionne Bucy, MD, MPH [facility]

## 2019-12-30 ENCOUNTER — Encounter: Payer: Self-pay | Admitting: Family Medicine

## 2019-12-30 LAB — CBC WITH DIFFERENTIAL/PLATELET
Basophils Absolute: 0 10*3/uL (ref 0.0–0.2)
Basos: 0 %
EOS (ABSOLUTE): 0.2 10*3/uL (ref 0.0–0.4)
Eos: 3 %
Hematocrit: 38.2 % (ref 34.0–46.6)
Hemoglobin: 12.8 g/dL (ref 11.1–15.9)
Immature Grans (Abs): 0 10*3/uL (ref 0.0–0.1)
Immature Granulocytes: 0 %
Lymphocytes Absolute: 2.5 10*3/uL (ref 0.7–3.1)
Lymphs: 35 %
MCH: 31.3 pg (ref 26.6–33.0)
MCHC: 33.5 g/dL (ref 31.5–35.7)
MCV: 93 fL (ref 79–97)
Monocytes Absolute: 0.7 10*3/uL (ref 0.1–0.9)
Monocytes: 10 %
Neutrophils Absolute: 3.8 10*3/uL (ref 1.4–7.0)
Neutrophils: 52 %
Platelets: 162 10*3/uL (ref 150–450)
RBC: 4.09 x10E6/uL (ref 3.77–5.28)
RDW: 12.3 % (ref 11.7–15.4)
WBC: 7.3 10*3/uL (ref 3.4–10.8)

## 2019-12-30 LAB — COMPREHENSIVE METABOLIC PANEL
ALT: 44 IU/L — ABNORMAL HIGH (ref 0–32)
AST: 40 IU/L (ref 0–40)
Albumin/Globulin Ratio: 1.3 (ref 1.2–2.2)
Albumin: 4.2 g/dL (ref 3.8–4.9)
Alkaline Phosphatase: 104 IU/L (ref 44–121)
BUN/Creatinine Ratio: 18 (ref 9–23)
BUN: 12 mg/dL (ref 6–24)
Bilirubin Total: 0.4 mg/dL (ref 0.0–1.2)
CO2: 25 mmol/L (ref 20–29)
Calcium: 9.3 mg/dL (ref 8.7–10.2)
Chloride: 100 mmol/L (ref 96–106)
Creatinine, Ser: 0.67 mg/dL (ref 0.57–1.00)
GFR calc Af Amer: 111 mL/min/{1.73_m2} (ref >59)
GFR calc non Af Amer: 97 mL/min/{1.73_m2} (ref >59)
Globulin, Total: 3.2 g/dL (ref 1.5–4.5)
Glucose: 188 mg/dL — ABNORMAL HIGH (ref 65–99)
Potassium: 4 mmol/L (ref 3.5–5.2)
Sodium: 137 mmol/L (ref 134–144)
Total Protein: 7.4 g/dL (ref 6.0–8.5)

## 2019-12-30 NOTE — Assessment & Plan Note (Signed)
Chronic and stable Continue Effexor at current dose

## 2019-12-30 NOTE — Assessment & Plan Note (Signed)
Discussed importance of healthy weight management Discussed diet and exercise  

## 2019-12-30 NOTE — Assessment & Plan Note (Signed)
On Eliquis for A. fib Recheck CBC

## 2019-12-30 NOTE — Assessment & Plan Note (Signed)
Previously not at goal Also followed by cardiology Remote history not tolerating Crestor due to myalgias Did not tolerate atorvastatin due to GI upset Previously tolerated pravastatin well, however pravastatin at max dose did not reduce LDL sufficiently Goal LDL less than 70 in the setting of diabetes Tolerating low-dose Crestor, which we will continue at current dose Continue co-Q10 to reduce incidence of myalgia Recheck FLP and CMP

## 2019-12-30 NOTE — Assessment & Plan Note (Signed)
Well-controlled Continue current medications Up-to-date on screenings and vaccinations On ACE inhibitor and statin Discussed diet and exercise Follow-up in 6 months

## 2020-01-01 ENCOUNTER — Telehealth: Payer: Self-pay

## 2020-01-01 NOTE — Telephone Encounter (Signed)
Left message advising pt.  (Per DPR)  Thanks,   -Latarsha Zani  

## 2020-01-01 NOTE — Telephone Encounter (Signed)
-----   Message from Virginia Crews, MD sent at 12/30/2019  2:04 PM EDT ----- Normal labs

## 2020-01-08 ENCOUNTER — Other Ambulatory Visit: Payer: Self-pay

## 2020-01-08 ENCOUNTER — Ambulatory Visit (INDEPENDENT_AMBULATORY_CARE_PROVIDER_SITE_OTHER): Payer: Managed Care, Other (non HMO)

## 2020-01-08 DIAGNOSIS — Z23 Encounter for immunization: Secondary | ICD-10-CM | POA: Diagnosis not present

## 2020-01-12 ENCOUNTER — Ambulatory Visit: Payer: Self-pay | Admitting: Family Medicine

## 2020-02-25 ENCOUNTER — Encounter: Payer: Self-pay | Admitting: Family Medicine

## 2020-02-25 MED ORDER — VALACYCLOVIR HCL 500 MG PO TABS: 500.0000 mg | ORAL_TABLET | Freq: Every day | ORAL | 0 refills | Status: DC

## 2020-03-08 ENCOUNTER — Encounter: Payer: Self-pay | Admitting: Family Medicine

## 2020-03-09 ENCOUNTER — Encounter: Payer: Self-pay | Admitting: Family Medicine

## 2020-03-09 MED ORDER — VENLAFAXINE HCL ER 75 MG PO CP24
75.0000 mg | ORAL_CAPSULE | Freq: Every day | ORAL | 1 refills | Status: DC
Start: 2020-03-09 — End: 2020-08-09

## 2020-04-05 ENCOUNTER — Other Ambulatory Visit: Payer: Self-pay

## 2020-04-05 ENCOUNTER — Ambulatory Visit: Payer: Managed Care, Other (non HMO) | Admitting: Family Medicine

## 2020-04-05 ENCOUNTER — Encounter: Payer: Self-pay | Admitting: Family Medicine

## 2020-04-05 VITALS — BP 130/70 | HR 84 | Temp 98.2°F | Resp 16 | Wt 209.5 lb

## 2020-04-05 DIAGNOSIS — K5909 Other constipation: Secondary | ICD-10-CM

## 2020-04-05 NOTE — Assessment & Plan Note (Addendum)
Chronic issue Has experienced some improvement with Linzess Will continue at current dose Counseled on importance of high fiber diet F/u in 3 months

## 2020-04-05 NOTE — Progress Notes (Signed)
Established patient visit   Patient: Julia Stark   DOB: 1960-05-10   60 y.o. Female  MRN: [id number] Visit Date: 04/05/2020  Today's healthcare provider: [name], MD   Chief Complaint  Patient presents with   Follow-up   Subjective    HPI   Mirah has improved since her last visit. She feels that the Carmin [place] is working. Though she notices that she will have diarrhea some times when she takes it. She also feels that her bowel patterns tend to be sporadic. She will take her medication and not use the bathroom and the next day have diarrhea. Overall she feels that the medication is helping but does not want to increase the dose.    Social History   Tobacco Use   Smoking status: Never Smoker   Smokeless tobacco: Never Used  Vaping Use   Vaping Use: Never used  Substance Use Topics   Alcohol use: Yes    Alcohol/week: 4.0 standard drinks    Types: 2 Glasses of wine, 2 Cans of beer per week   Drug use: No       Medications: Outpatient Medications Prior to Visit  Medication Sig   apixaban (ELIQUIS) 5 MG TABS tablet Take 5 mg by mouth 2 (two) times daily.   diltiazem (TIAZAC) 120 MG 24 hr capsule Take 1 capsule by mouth in the morning.   EQ FIBER SUPPLEMENT PO Take by mouth.   linaclotide (LINZESS) 72 MCG capsule Take 1 capsule (72 mcg total) by mouth daily before breakfast.   metFORMIN (GLUCOPHAGE) 1000 MG tablet Take 1,000 mg by mouth daily.   Multiple Vitamin (MULTIVITAMIN) tablet Take 1 tablet by mouth at bedtime. One-A-Day Women's 50+   ramipril (ALTACE) 10 MG capsule Take 1 capsule (10 mg total) by mouth daily.   rosuvastatin (CRESTOR) 5 MG tablet Take 1 tablet (5 mg total) by mouth daily.   valACYclovir (VALTREX) 500 MG tablet Take 1 tablet (500 mg total) by mouth daily.   venlafaxine XR (EFFEXOR XR) 75 MG 24 hr capsule Take 1 capsule (75 mg total) by mouth daily with breakfast.   fluticasone (FLONASE) 50 MCG/ACT nasal spray Place 2  sprays into both nostrils daily. (Patient not taking: Reported on 04/05/2020)   No facility-administered medications prior to visit.    Review of Systems  Constitutional: Positive for fatigue.  HENT: Negative.   Eyes: Negative.   Cardiovascular: Negative.   Gastrointestinal: Negative.   Endocrine: Negative.   Genitourinary: Negative.   Musculoskeletal: Negative.   Skin: Negative.   Allergic/Immunologic: Negative.   Neurological: Negative.   Hematological: Negative.   Psychiatric/Behavioral: Negative.       Objective    BP 130/70 (BP Location: Left Arm, Patient Position: Sitting, Cuff Size: Large)    Pulse 84    Temp 98.2 F (36.8 C) (Oral)    Resp 16    Wt 209 lb 8 oz (95 kg)    LMP 04/13/2011    SpO2 97%    BMI 32.81 kg/m    Physical Exam  General: Well appearing in NAD Lungs: CTA bilaterally Cards: RRR no murmurs rubs or gallops Abdomen: Soft, non-distendend Non-tender to palpation    No results found for any visits on 04/05/20.  Assessment & Plan     Problem List Items Addressed This Visit      Digestive   Chronic constipation - Primary    Chronic issue Has experienced some improvement with Linzess Will continue at current  dose Counseled on importance of high fiber diet F/u in 3 months          Return in about 3 months (around 07/04/2020) for chronic disease f/u.      Patient seen along with [employer] student [facility]. I personally evaluated this patient along with the student, and verified all aspects of the history, physical exam, and medical decision making as documented by the student. I agree with the student's documentation and have made all necessary edits.  Donnetta Gillin, Dionne Bucy, MD, MPH [facility]

## 2020-04-15 ENCOUNTER — Other Ambulatory Visit: Payer: Self-pay | Admitting: Physician Assistant

## 2020-05-14 ENCOUNTER — Ambulatory Visit: Payer: Managed Care, Other (non HMO) | Admitting: Adult Health

## 2020-05-14 ENCOUNTER — Ambulatory Visit
Admission: RE | Admit: 2020-05-14 | Discharge: 2020-05-14 | Disposition: A | Discharge status: Home / Self Care | Payer: Managed Care, Other (non HMO) | Source: Ambulatory Visit | Attending: Adult Health | Admitting: Adult Health

## 2020-05-14 ENCOUNTER — Encounter: Payer: Self-pay | Admitting: Adult Health

## 2020-05-14 ENCOUNTER — Other Ambulatory Visit: Payer: Self-pay

## 2020-05-14 VITALS — BP 130/72 | HR 72 | Temp 98.0°F | Resp 18

## 2020-05-14 DIAGNOSIS — M545 Low back pain, unspecified: Secondary | ICD-10-CM

## 2020-05-14 DIAGNOSIS — R82998 Other abnormal findings in urine: Secondary | ICD-10-CM | POA: Diagnosis present

## 2020-05-14 LAB — POCT URINALYSIS DIPSTICK
Glucose, UA: NEGATIVE
Nitrite, UA: NEGATIVE
Protein, UA: POSITIVE — AB
Spec Grav, UA: 1.02 (ref 1.010–1.025)
Urobilinogen, UA: >=8 E.U./dL — AB
pH, UA: 6.5 (ref 5.0–8.0)

## 2020-05-14 MED ORDER — CEPHALEXIN 500 MG PO CAPS: 500.0000 mg | ORAL_CAPSULE | Freq: Three times a day (TID) | ORAL | 0 refills | Status: DC

## 2020-05-14 NOTE — Progress Notes (Signed)
Urine microscopic and culture ordered.

## 2020-05-14 NOTE — Patient Instructions (Signed)
Urinary Tract Infection, Adult A urinary tract infection (UTI) is an infection of any part of the urinary tract. The urinary tract includes:  The kidneys.  The ureters.  The bladder.  The urethra. These organs make, store, and get rid of pee (urine) in the body. What are the causes? This infection is caused by germs (bacteria) in your genital area. These germs grow and cause swelling (inflammation) of your urinary tract. What increases the risk? The following factors may make you more likely to develop this condition:  Using a small, thin tube (catheter) to drain pee.  Not being able to control when you pee or poop (incontinence).  Being female. If you are female, these things can increase the risk: ? Using these methods to prevent pregnancy:  A medicine that kills sperm (spermicide).  A device that blocks sperm (diaphragm). ? Having low levels of a female hormone (estrogen). ? Being pregnant. You are more likely to develop this condition if:  You have genes that add to your risk.  You are sexually active.  You take antibiotic medicines.  You have trouble peeing because of: ? A prostate that is bigger than normal, if you are female. ? A blockage in the part of your body that drains pee from the bladder. ? A kidney stone. ? A nerve condition that affects your bladder. ? Not getting enough to drink. ? Not peeing often enough.  You have other conditions, such as: ? Diabetes. ? A weak disease-fighting system (immune system). ? Sickle cell disease. ? Gout. ? Injury of the spine. What are the signs or symptoms? Symptoms of this condition include:  Needing to pee right away.  Peeing small amounts often.  Pain or burning when peeing.  Blood in the pee.  Pee that smells bad or not like normal.  Trouble peeing.  Pee that is cloudy.  Fluid coming from the vagina, if you are female.  Pain in the belly or lower back. Other symptoms include:  Vomiting.  Not  feeling hungry.  Feeling mixed up (confused). This may be the first symptom in older adults.  Being tired and grouchy (irritable).  A fever.  Watery poop (diarrhea). How is this treated?  Taking antibiotic medicine.  Taking other medicines.  Drinking enough water. In some cases, you may need to see a specialist. Follow these instructions at home: Medicines  Take over-the-counter and prescription medicines only as told by your doctor.  If you were prescribed an antibiotic medicine, take it as told by your doctor. Do not stop taking it even if you start to feel better. General instructions  Make sure you: ? Pee until your bladder is empty. ? Do not hold pee for a long time. ? Empty your bladder after sex. ? Wipe from front to back after peeing or pooping if you are a female. Use each tissue one time when you wipe.  Drink enough fluid to keep your pee pale yellow.  Keep all follow-up visits.   Contact a doctor if:  You do not get better after 1-2 days.  Your symptoms go away and then come back. Get help right away if:  You have very bad back pain.  You have very bad pain in your lower belly.  You have a fever.  You have chills.  You feeling like you will vomit or you vomit. Summary  A urinary tract infection (UTI) is an infection of any part of the urinary tract.  This condition is caused by   germs in your genital area.  There are many risk factors for a UTI.  Treatment includes antibiotic medicines.  Drink enough fluid to keep your pee pale yellow. This information is not intended to replace advice given to you by your health care provider. Make sure you discuss any questions you have with your health care provider. Document Revised: 10/31/2019 Document Reviewed: 10/31/2019 Elsevier Patient Education  2021 Elsevier Inc.  

## 2020-05-14 NOTE — Progress Notes (Signed)
Established patient visit   Patient: Julia Stark   DOB: 1960/10/15   60 y.o. Female  MRN: [id number] Visit Date: 05/14/2020  Today's healthcare provider: Marcille Buffy, FNP   Chief Complaint  Patient presents with  . Back Pain   Subjective    HPI HPI    Back Pain    This is a new problem.  There was not an injury that may have caused the pain.  Recent episode started in the past 7 days.  Pain is lumbar spine.  The quality of pain is described as aching.  Pain occurs intermittently.  The symptoms are aggravated by bending.  Symptoms are relieved by lying down.  Abdominal Pain: Absent.  Bowel incontinence: Absent.  Chest pain: Absent.  Dysuria: Absent.  Fever: Absent.  Headaches: Absent.  Joint pains: Absent.  Weakness in leg: Absent.  Pelvic pain: Absent.  Tingling in lower extremities: Absent.  Urinary incontinence: Present.  Weight loss: Absent.       Last edited by Minette Headland, CMA on 05/14/2020  3:51 PM. (History)      She reports she did have some feelings of having to urinate frequently last week.  Denies any symptoms of dysuria. She did notice pink color in urine yesterday.   She has history of small kidney stone in past. Never lithotripsy.  3/10 pain.  Patient  denies any fever,chills, rash, chest pain, shortness of breath, nausea, vomiting, or diarrhea.  Denies dizziness, lightheadedness, pre syncopal or syncopal episodes.       Medications: Outpatient Medications Prior to Visit  Medication Sig  . apixaban (ELIQUIS) 5 MG TABS tablet Take 5 mg by mouth 2 (two) times daily.  Marland Kitchen diltiazem (CARDIZEM CD) 120 MG 24 hr capsule Take 120 mg by mouth daily.  Marland Kitchen diltiazem (TIAZAC) 120 MG 24 hr capsule Take 1 capsule by mouth in the morning. (Patient not taking: Reported on 05/17/2020)  . EQ FIBER SUPPLEMENT PO Take by mouth. (Patient not taking: Reported on 05/17/2020)  . linaclotide (LINZESS) 72 MCG capsule Take 1 capsule (72 mcg total) by mouth daily  before breakfast.  . metFORMIN (GLUCOPHAGE) 1000 MG tablet Take 1,000 mg by mouth daily.  . metoprolol tartrate (LOPRESSOR) 25 MG tablet Take 25 mg by mouth 2 (two) times daily.  . MULTAQ 400 MG tablet Take 400 mg by mouth 2 (two) times daily.  . Multiple Vitamin (MULTIVITAMIN) tablet Take 1 tablet by mouth at bedtime. One-A-Day Women's 50+ (Patient not taking: Reported on 05/17/2020)  . ramipril (ALTACE) 10 MG capsule TAKE 1 CAPSULE(10 MG) BY MOUTH DAILY (Patient taking differently: Take 10 mg by mouth daily.)  . rosuvastatin (CRESTOR) 5 MG tablet Take 1 tablet (5 mg total) by mouth daily.  . valACYclovir (VALTREX) 500 MG tablet Take 1 tablet (500 mg total) by mouth daily. (Patient taking differently: Take 500 mg by mouth daily as needed (outbreaks).)  . venlafaxine XR (EFFEXOR XR) 75 MG 24 hr capsule Take 1 capsule (75 mg total) by mouth daily with breakfast.  . fluticasone (FLONASE) 50 MCG/ACT nasal spray Place 2 sprays into both nostrils daily.   No facility-administered medications prior to visit.    Review of Systems  HENT: Negative.   Respiratory: Negative.   Cardiovascular: Negative.   Gastrointestinal: Negative.   Genitourinary: Negative.   Musculoskeletal: Positive for back pain. Negative for gait problem, joint swelling and myalgias.  Skin: Negative.   Neurological: Negative.   Psychiatric/Behavioral: Negative.  Objective    LMP 04/13/2011  BP Readings from Last 3 Encounters:  04/05/20 130/70  12/29/19 130/76  12/04/19 117/75   Wt Readings from Last 3 Encounters:  04/05/20 209 lb 8 oz (95 kg)  12/29/19 200 lb (90.7 kg)  12/04/19 197 lb (89.4 kg)    Last menstrual period 04/13/2011.   Physical Exam Vitals reviewed.  Constitutional:      General: [name] not in acute distress.    Appearance: [name] well-developed. [name] not diaphoretic.     Interventions: [name] not intubated. HENT:     Head: Normocephalic and atraumatic.     Right Ear: External ear  normal.     Left Ear: External ear normal.     Nose: Nose normal.     Mouth/Throat:     Pharynx: No oropharyngeal exudate.  Eyes:     General: Lids are normal. No scleral icterus.       Right eye: No discharge.        Left eye: No discharge.     Conjunctiva/sclera: Conjunctivae normal.     Right eye: Right conjunctiva is not injected. No exudate or hemorrhage.    Left eye: Left conjunctiva is not injected. No exudate or hemorrhage.    Pupils: Pupils are equal, round, and reactive to light.  Neck:     Thyroid: No thyroid mass or thyromegaly.     Vascular: Normal carotid pulses. No carotid bruit, hepatojugular reflux or JVD.     Trachea: Trachea and phonation normal. No tracheal tenderness or tracheal deviation.     Meningeal: Brudzinski's sign and Kernig's sign absent.  Cardiovascular:     Rate and Rhythm: Normal rate and regular rhythm.     Pulses: Normal pulses.          Radial pulses are 2+ on the right side and 2+ on the left side.       Dorsalis pedis pulses are 2+ on the right side and 2+ on the left side.       Posterior tibial pulses are 2+ on the right side and 2+ on the left side.     Heart sounds: Normal heart sounds, S1 normal and S2 normal. Heart sounds not distant. No murmur heard. No friction rub. No gallop.   Pulmonary:     Effort: Pulmonary effort is normal. No tachypnea, bradypnea, accessory muscle usage or respiratory distress. [name] not intubated.     Breath sounds: Normal breath sounds. No stridor. No wheezing or rales.  Chest:     Chest wall: No tenderness.  Breasts:     Right: No supraclavicular adenopathy.     Left: No supraclavicular adenopathy.    Abdominal:     General: Bowel sounds are normal. There is no distension or abdominal bruit.     Palpations: Abdomen is soft. There is no shifting dullness, fluid wave, hepatomegaly, splenomegaly, mass or pulsatile mass.     Tenderness: There is no abdominal tenderness. There is no guarding or rebound.      Hernia: No hernia is present.  Musculoskeletal:        General: No tenderness or deformity. Normal range of motion.     Cervical back: Full passive range of motion without pain, normal range of motion and neck supple. No edema, erythema or rigidity. No spinous process tenderness or muscular tenderness. Normal range of motion.  Lymphadenopathy:     Head:     Right side of head: No submental, submandibular, tonsillar,  preauricular, posterior auricular or occipital adenopathy.     Left side of head: No submental, submandibular, tonsillar, preauricular, posterior auricular or occipital adenopathy.     Cervical: No cervical adenopathy.     Right cervical: No superficial, deep or posterior cervical adenopathy.    Left cervical: No superficial, deep or posterior cervical adenopathy.     Upper Body:     Right upper body: No supraclavicular or pectoral adenopathy.     Left upper body: No supraclavicular or pectoral adenopathy.  Skin:    General: Skin is warm and dry.     Coloration: Skin is not pale.     Findings: No abrasion, bruising, burn, ecchymosis, erythema, lesion, petechiae or rash.     Nails: There is no clubbing.  Neurological:     Mental Status: [name] alert and oriented to person, place, and time.     GCS: GCS eye subscore is 4. GCS verbal subscore is 5. GCS motor subscore is 6.     Cranial Nerves: No cranial nerve deficit.     Sensory: No sensory deficit.     Motor: No tremor, atrophy, abnormal muscle tone or seizure activity.     Coordination: Coordination normal.     Gait: Gait normal.     Deep Tendon Reflexes: Reflexes are normal and symmetric. Reflexes normal. Babinski sign absent on the right side. Babinski sign absent on the left side.     Reflex Scores:      Tricep reflexes are 2+ on the right side and 2+ on the left side.      Bicep reflexes are 2+ on the right side and 2+ on the left side.      Brachioradialis reflexes are 2+ on the right side and 2+ on the left side.       Patellar reflexes are 2+ on the right side and 2+ on the left side.      Achilles reflexes are 2+ on the right side and 2+ on the left side. Psychiatric:        Speech: Speech normal.        Behavior: Behavior normal.        Thought Content: Thought content normal.        Judgment: Judgment normal.       Results for orders placed or performed in visit on 05/14/20  Urine Culture   Specimen: Urine   Urine  Result Value Ref Range   Urine Culture, Routine Final report    Organism ID, Bacteria Comment   CBC with Differential/Platelet  Result Value Ref Range   WBC 5.7 3.4 - 10.8 x10E3/uL   RBC 3.97 3.77 - 5.28 x10E6/uL   Hemoglobin 12.5 11.1 - 15.9 g/dL   Hematocrit 35.6 34.0 - 46.6 %   MCV 90 79 - 97 fL   MCH 31.5 26.6 - 33.0 pg   MCHC 35.1 31.5 - 35.7 g/dL   RDW 12.0 11.7 - 15.4 %   Platelets 126 (L) 150 - 450 x10E3/uL   Neutrophils 48 Not Estab. %   Lymphs 40 Not Estab. %   Monocytes 10 Not Estab. %   Eos 2 Not Estab. %   Basos 0 Not Estab. %   Neutrophils Absolute 2.8 1.4 - 7.0 x10E3/uL   Lymphocytes Absolute 2.3 0.7 - 3.1 x10E3/uL   Monocytes Absolute 0.5 0.1 - 0.9 x10E3/uL   EOS (ABSOLUTE) 0.1 0.0 - 0.4 x10E3/uL   Basophils Absolute 0.0 0.0 - 0.2 x10E3/uL   Immature Granulocytes  0 Not Estab. %   Immature Grans (Abs) 0.0 0.0 - 0.1 x10E3/uL  Comprehensive Metabolic Panel (CMET)  Result Value Ref Range   Glucose 139 (H) 65 - 99 mg/dL   BUN 10 6 - 24 mg/dL   Creatinine, Ser 0.74 0.57 - 1.00 mg/dL   GFR calc non Af Amer 89 >59 mL/min/1.73   GFR calc Af Amer 103 >59 mL/min/1.73   BUN/Creatinine Ratio 14 9 - 23   Sodium 139 134 - 144 mmol/L   Potassium 4.0 3.5 - 5.2 mmol/L   Chloride 102 96 - 106 mmol/L   CO2 21 20 - 29 mmol/L   Calcium 9.2 8.7 - 10.2 mg/dL   Total Protein 7.6 6.0 - 8.5 g/dL   Albumin 4.4 3.8 - 4.9 g/dL   Globulin, Total 3.2 1.5 - 4.5 g/dL   Albumin/Globulin Ratio 1.4 1.2 - 2.2   Bilirubin Total 0.9 0.0 - 1.2 mg/dL   Alkaline Phosphatase 84 44 -  121 IU/L   AST 38 0 - 40 IU/L   ALT 32 0 - 32 IU/L  Urine Microscopic  Result Value Ref Range   WBC, UA None seen 0 - 5 /hpf   RBC >30 (A) 0 - 2 /hpf   Epithelial Cells (non renal) None seen 0 - 10 /hpf   Casts None seen None seen /lpf   Crystals Present (A) N/A   Crystal Type Calcium Oxalate N/A   Bacteria, UA None seen None seen/Few  Lipase  Result Value Ref Range   Lipase 56 14 - 72 U/L  Specimen status report  Result Value Ref Range   specimen status report Comment   POCT urinalysis dipstick  Result Value Ref Range   Color, UA amber    Clarity, UA cloudy    Glucose, UA Negative Negative   Bilirubin, UA small    Ketones, UA trace    Spec Grav, UA 1.020 1.010 - 1.025   Blood, UA large    pH, UA 6.5 5.0 - 8.0   Protein, UA Positive (A) Negative   Urobilinogen, UA >=8.0 (A) 0.2 or 1.0 E.U./dL   Nitrite, UA negative    Leukocytes, UA Trace (A) Negative   Appearance     Odor      Assessment & Plan     1. Acute right-sided low back pain without sciatica  - POCT urinalysis dipstick - CBC with Differential/Platelet - Comprehensive Metabolic Panel (CMET) - DG Abd 1 View  2. Leukocytes in urine  - CBC with Differential/Platelet - Comprehensive Metabolic Panel (CMET) - DG Abd 1 View - Urine Culture - Urine Microscopic   Meds ordered this encounter  Medications  . cephALEXin (KEFLEX) 500 MG capsule    Sig: Take 1 capsule (500 mg total) by mouth 3 (three) times daily.    Dispense:  30 capsule    Refill:  0    Red Flags discussed. The patient was given clear instructions to go to ER or return to medical center if any red flags develop, symptoms do not improve, worsen or new problems develop. They verbalized understanding.   Return in about 1 week (around 05/21/2020), or if symptoms worsen or fail to improve.      The entirety of the information documented in the History of Present Illness, Review of Systems and Physical Exam were personally obtained by me.  Portions of this information were initially documented by the CMA and reviewed by me for thoroughness and accuracy.  Marcille Buffy, [place] [phone] (phone) [phone] (fax)  [place]

## 2020-05-15 ENCOUNTER — Other Ambulatory Visit: Payer: Self-pay | Admitting: Adult Health

## 2020-05-15 DIAGNOSIS — N201 Calculus of ureter: Secondary | ICD-10-CM

## 2020-05-15 DIAGNOSIS — R1031 Right lower quadrant pain: Secondary | ICD-10-CM

## 2020-05-15 LAB — URINALYSIS, MICROSCOPIC ONLY
Bacteria, UA: NONE SEEN
Casts: NONE SEEN /lpf
Epithelial Cells (non renal): NONE SEEN /hpf (ref 0–10)
RBC, Urine: >30 /hpf — AB (ref 0–2)
WBC, UA: NONE SEEN /hpf (ref 0–5)

## 2020-05-15 LAB — CBC WITH DIFFERENTIAL/PLATELET
Basophils Absolute: 0 10*3/uL (ref 0.0–0.2)
Basos: 0 %
EOS (ABSOLUTE): 0.1 10*3/uL (ref 0.0–0.4)
Eos: 2 %
Hematocrit: 35.6 % (ref 34.0–46.6)
Hemoglobin: 12.5 g/dL (ref 11.1–15.9)
Immature Grans (Abs): 0 10*3/uL (ref 0.0–0.1)
Immature Granulocytes: 0 %
Lymphocytes Absolute: 2.3 10*3/uL (ref 0.7–3.1)
Lymphs: 40 %
MCH: 31.5 pg (ref 26.6–33.0)
MCHC: 35.1 g/dL (ref 31.5–35.7)
MCV: 90 fL (ref 79–97)
Monocytes Absolute: 0.5 10*3/uL (ref 0.1–0.9)
Monocytes: 10 %
Neutrophils Absolute: 2.8 10*3/uL (ref 1.4–7.0)
Neutrophils: 48 %
Platelets: 126 10*3/uL — ABNORMAL LOW (ref 150–450)
RBC: 3.97 x10E6/uL (ref 3.77–5.28)
RDW: 12 % (ref 11.7–15.4)
WBC: 5.7 10*3/uL (ref 3.4–10.8)

## 2020-05-15 LAB — COMPREHENSIVE METABOLIC PANEL
ALT: 32 IU/L (ref 0–32)
AST: 38 IU/L (ref 0–40)
Albumin/Globulin Ratio: 1.4 (ref 1.2–2.2)
Albumin: 4.4 g/dL (ref 3.8–4.9)
Alkaline Phosphatase: 84 IU/L (ref 44–121)
BUN/Creatinine Ratio: 14 (ref 9–23)
BUN: 10 mg/dL (ref 6–24)
Bilirubin Total: 0.9 mg/dL (ref 0.0–1.2)
CO2: 21 mmol/L (ref 20–29)
Calcium: 9.2 mg/dL (ref 8.7–10.2)
Chloride: 102 mmol/L (ref 96–106)
Creatinine, Ser: 0.74 mg/dL (ref 0.57–1.00)
GFR calc Af Amer: 103 mL/min/{1.73_m2} (ref >59)
GFR calc non Af Amer: 89 mL/min/{1.73_m2} (ref >59)
Globulin, Total: 3.2 g/dL (ref 1.5–4.5)
Glucose: 139 mg/dL — ABNORMAL HIGH (ref 65–99)
Potassium: 4 mmol/L (ref 3.5–5.2)
Sodium: 139 mmol/L (ref 134–144)
Total Protein: 7.6 g/dL (ref 6.0–8.5)

## 2020-05-15 NOTE — Progress Notes (Signed)
Small kidney stone seen right kidney and 5 mm right urethral stone. Seek care over weekend if any symptoms worsening will go ahead and refer to urology - stones 5 mm and greater have around a 50 percent chance of you not passing it on your own and needing possible intervention. Provider placed order for CT renal stone study.

## 2020-05-15 NOTE — Progress Notes (Signed)
Orders Placed This Encounter  Procedures  . CT RENAL STONE STUDY  . Ambulatory referral to Urology

## 2020-05-16 LAB — SPECIMEN STATUS REPORT

## 2020-05-16 LAB — URINE CULTURE

## 2020-05-16 LAB — LIPASE: Lipase: 56 U/L (ref 14–72)

## 2020-05-17 ENCOUNTER — Encounter: Payer: Self-pay | Admitting: Adult Health

## 2020-05-17 NOTE — Progress Notes (Signed)
Lipase is within normal.  Mixed urogenial flora on urine culture, if improved with antibiotics she can complete.  See other result note as well regarding kidney stone right ureter 5 mm has been referred to urology she should hear this week for an appointment, CT renal stone study ordered as well.  Seek care immediately if symptoms worsen at anytime.

## 2020-05-21 ENCOUNTER — Other Ambulatory Visit
Admission: RE | Admit: 2020-05-21 | Discharge: 2020-05-21 | Disposition: A | Discharge status: Home / Self Care | Payer: Managed Care, Other (non HMO) | Source: Ambulatory Visit | Attending: Internal Medicine | Admitting: Internal Medicine

## 2020-05-21 ENCOUNTER — Other Ambulatory Visit: Payer: Self-pay

## 2020-05-21 DIAGNOSIS — Z20822 Contact with and (suspected) exposure to covid-19: Secondary | ICD-10-CM | POA: Diagnosis not present

## 2020-05-21 DIAGNOSIS — Z01812 Encounter for preprocedural laboratory examination: Secondary | ICD-10-CM | POA: Insufficient documentation

## 2020-05-21 LAB — SARS CORONAVIRUS 2 (TAT 6-24 HRS): SARS Coronavirus 2: NEGATIVE

## 2020-05-25 ENCOUNTER — Ambulatory Visit: Payer: Managed Care, Other (non HMO) | Admitting: Anesthesiology

## 2020-05-25 ENCOUNTER — Ambulatory Visit
Admission: RE | Admit: 2020-05-25 | Discharge: 2020-05-25 | Disposition: A | Discharge status: Home / Self Care | Payer: Managed Care, Other (non HMO) | Attending: Internal Medicine | Admitting: Internal Medicine

## 2020-05-25 ENCOUNTER — Encounter
Admission: RE | Disposition: A | Discharge status: Home / Self Care | Payer: Self-pay | Source: Home / Self Care | Attending: Internal Medicine

## 2020-05-25 ENCOUNTER — Other Ambulatory Visit: Payer: Self-pay

## 2020-05-25 ENCOUNTER — Encounter: Payer: Self-pay | Admitting: Internal Medicine

## 2020-05-25 DIAGNOSIS — E785 Hyperlipidemia, unspecified: Secondary | ICD-10-CM | POA: Diagnosis not present

## 2020-05-25 DIAGNOSIS — Z888 Allergy status to other drugs, medicaments and biological substances status: Secondary | ICD-10-CM | POA: Diagnosis not present

## 2020-05-25 DIAGNOSIS — E119 Type 2 diabetes mellitus without complications: Secondary | ICD-10-CM | POA: Insufficient documentation

## 2020-05-25 DIAGNOSIS — Z7901 Long term (current) use of anticoagulants: Secondary | ICD-10-CM | POA: Insufficient documentation

## 2020-05-25 DIAGNOSIS — Z8249 Family history of ischemic heart disease and other diseases of the circulatory system: Secondary | ICD-10-CM | POA: Insufficient documentation

## 2020-05-25 DIAGNOSIS — Z7984 Long term (current) use of oral hypoglycemic drugs: Secondary | ICD-10-CM | POA: Diagnosis not present

## 2020-05-25 DIAGNOSIS — I480 Paroxysmal atrial fibrillation: Secondary | ICD-10-CM | POA: Insufficient documentation

## 2020-05-25 DIAGNOSIS — Z8042 Family history of malignant neoplasm of prostate: Secondary | ICD-10-CM | POA: Insufficient documentation

## 2020-05-25 DIAGNOSIS — Z79899 Other long term (current) drug therapy: Secondary | ICD-10-CM | POA: Diagnosis not present

## 2020-05-25 DIAGNOSIS — Z808 Family history of malignant neoplasm of other organs or systems: Secondary | ICD-10-CM | POA: Diagnosis not present

## 2020-05-25 DIAGNOSIS — Z85820 Personal history of malignant melanoma of skin: Secondary | ICD-10-CM | POA: Insufficient documentation

## 2020-05-25 DIAGNOSIS — Z833 Family history of diabetes mellitus: Secondary | ICD-10-CM | POA: Insufficient documentation

## 2020-05-25 DIAGNOSIS — I10 Essential (primary) hypertension: Secondary | ICD-10-CM | POA: Diagnosis not present

## 2020-05-25 DIAGNOSIS — Z9049 Acquired absence of other specified parts of digestive tract: Secondary | ICD-10-CM | POA: Diagnosis not present

## 2020-05-25 HISTORY — PX: CARDIOVERSION: SHX1299

## 2020-05-25 LAB — GLUCOSE, CAPILLARY: Glucose-Capillary: 134 mg/dL — ABNORMAL HIGH (ref 70–99)

## 2020-05-25 SURGERY — CARDIOVERSION
Anesthesia: General

## 2020-05-25 MED ORDER — LIDOCAINE HCL (CARDIAC) PF 100 MG/5ML IV SOSY
PREFILLED_SYRINGE | INTRAVENOUS | Status: DC | PRN
  Administered 2020-05-25: 100 mg via INTRAVENOUS

## 2020-05-25 MED ORDER — PROPOFOL 10 MG/ML IV BOLUS
INTRAVENOUS | Status: DC | PRN
  Administered 2020-05-25: 60 mg via INTRAVENOUS
  Administered 2020-05-25: 20 mg via INTRAVENOUS

## 2020-05-25 NOTE — Transfer of Care (Signed)
Immediate Anesthesia Transfer of Care Note  Patient: Julia Stark  Procedure(s) Performed: CARDIOVERSION (N/A )  Patient Location: PACU  Anesthesia Type:General  Level of Consciousness: awake, alert  and oriented  Airway & Oxygen Therapy: Patient Spontanous Breathing and Patient connected to nasal cannula oxygen  Post-op Assessment: Report given to RN and Post -op Vital signs reviewed and stable  Post vital signs: Reviewed and stable  Last Vitals:  Vitals Value Taken Time  BP 99/45 05/25/20 0746  Temp    Pulse 69 05/25/20 0746  Resp 14 05/25/20 0746  SpO2 94 % 05/25/20 0746  Vitals shown include unvalidated device data.  Last Pain:  Vitals:   05/25/20 0746  TempSrc:   PainSc: 0-No pain         Complications: No complications documented.

## 2020-05-25 NOTE — CV Procedure (Signed)
Electrical Cardioversion Procedure Note Julia Stark [id number] [birth year]  Procedure: Electrical Cardioversion Indications:  Paroxysmal non valvular atrial fibrillation  Procedure Details Consent: Risks of procedure as well as the alternatives and risks of each were explained to the (patient/caregiver).  Consent for procedure obtained. Time Out: Verified patient identification, verified procedure, site/side was marked, verified correct patient position, special equipment/implants available, medications/allergies/relevent history reviewed, required imaging and test results available.  Performed  Patient placed on cardiac monitor, pulse oximetry, supplemental oxygen as necessary.  Sedation given: Propofol and versed as per anesthesia  Pacer pads placed anterior and posterior chest.  Cardioverted 1 time(s).  Cardioverted at 120J.  Evaluation Findings: Post procedure EKG shows: NSR Complications: None Patient did tolerate procedure well.   Julia Stark M.D. [facility] 05/25/2020, 7:54 AM

## 2020-05-25 NOTE — Progress Notes (Signed)
Dr. Nehemiah Massed in at bedside , speaking with pt. And [name] re: cardioversion results. Both verbalize understanding of conversation.

## 2020-05-25 NOTE — Anesthesia Preprocedure Evaluation (Addendum)
Anesthesia Evaluation  Patient identified by MRN, date of birth, ID band Patient awake    Reviewed: Allergy & Precautions, H&P , NPO status , Patient's Chart, lab work & pertinent test results  History of Anesthesia Complications Negative for: history of anesthetic complications  Airway Mallampati: I  TM Distance: >3 FB Neck ROM: full    Dental  (+) Teeth Intact   Pulmonary neg pulmonary ROS, neg sleep apnea, neg COPD,    breath sounds clear to auscultation       Cardiovascular hypertension, (-) angina(-) Past MI and (-) Cardiac Stents + dysrhythmias Atrial Fibrillation  Rhythm:irregular Rate:Normal     Neuro/Psych PSYCHIATRIC DISORDERS Anxiety Depression negative neurological ROS     GI/Hepatic negative GI ROS, Neg liver ROS,   Endo/Other  diabetes  Renal/GU negative Renal ROS  negative genitourinary   Musculoskeletal   Abdominal   Peds  Hematology negative hematology ROS (+)   Anesthesia Other Findings Past Medical History: No date: Anxiety No date: Arthritis     Comment:  bilateral shoulders No date: Atrial fibrillation (HCC) No date: Cancer ([place])     Comment:  BASAL CELL CARCINOMA OF FACE No date: Colon polyp No date: Depression No date: Diabetes mellitus without complication ([place]) No date: Dysrhythmia     Comment:  A fib No date: Hyperlipidemia No date: Hypertension 12/24/2018: Sialoadenitis  Past Surgical History: No date: BASAL CELL CARCINOMA EXCISION 05/2019: CARDIAC ELECTROPHYSIOLOGY MAPPING AND ABLATION 03/04/2019: CARDIOVERSION; N/A     Comment:  Procedure: CARDIOVERSION;  Surgeon: Corey Skains,               MD;  Location: [facility];  Service: Cardiovascular;                Laterality: N/A; 06/11/2019: CARDIOVERSION; N/A     Comment:  Procedure: CARDIOVERSION;  Surgeon: Corey Skains,               MD;  Location: [facility];  Service: Cardiovascular;                Laterality:  N/A; 1999: CESAREAN SECTION No date: CHOLECYSTECTOMY 02/12/2012: COLONOSCOPY 04/09/2017: COLONOSCOPY WITH PROPOFOL; N/A     Comment:  Procedure: COLONOSCOPY WITH PROPOFOL;  Surgeon: Manya Silvas, MD;  Location: [facility];  Service:               Endoscopy;  Laterality: N/A; 05/12/2015: ELECTROPHYSIOLOGIC STUDY; N/A     Comment:  Procedure: Cardioversion;  Surgeon: Corey Skains,               MD;  Location: [facility];  Service: Cardiovascular;                Laterality: N/A; 1999: TUBAL LIGATION     Reproductive/Obstetrics negative OB ROS                            Anesthesia Physical Anesthesia Plan  ASA: II  Anesthesia Plan: General   Post-op Pain Management:    Induction:   PONV Risk Score and Plan:   Airway Management Planned:   Additional Equipment:   Intra-op Plan:   Post-operative Plan:   Informed Consent: I have reviewed the patients History and Physical, chart, labs and discussed the procedure including the risks, benefits and alternatives for the proposed anesthesia with the patient or authorized representative who has  indicated his/her understanding and acceptance.     Dental Advisory Given  Plan Discussed with: Anesthesiologist, CRNA and Surgeon  Anesthesia Plan Comments:        Anesthesia Quick Evaluation

## 2020-05-27 NOTE — Anesthesia Postprocedure Evaluation (Signed)
Anesthesia Post Note  Patient: Julia Stark  Procedure(s) Performed: CARDIOVERSION (N/A )  Patient location during evaluation: PACU Anesthesia Type: General Level of consciousness: awake and alert Pain management: pain level controlled Vital Signs Assessment: post-procedure vital signs reviewed and stable Respiratory status: spontaneous breathing, nonlabored ventilation and respiratory function stable Cardiovascular status: blood pressure returned to baseline and stable Postop Assessment: no apparent nausea or vomiting Anesthetic complications: no   No complications documented.   Last Vitals:  Vitals:   05/25/20 0800 05/25/20 0815  BP: (!) 98/58 109/61  Pulse: 66 68  Resp: 20 18  Temp:    SpO2: 96%     Last Pain:  Vitals:   05/25/20 0815  TempSrc:   PainSc: 0-No pain                 Brett Canales Athanasios Heldman

## 2020-05-31 ENCOUNTER — Other Ambulatory Visit: Payer: Self-pay | Admitting: Family Medicine

## 2020-06-01 ENCOUNTER — Other Ambulatory Visit: Payer: Self-pay | Admitting: Family Medicine

## 2020-06-14 ENCOUNTER — Other Ambulatory Visit: Payer: Self-pay | Admitting: Family Medicine

## 2020-06-14 DIAGNOSIS — Z1231 Encounter for screening mammogram for malignant neoplasm of breast: Secondary | ICD-10-CM

## 2020-07-05 ENCOUNTER — Other Ambulatory Visit: Payer: Self-pay

## 2020-07-05 ENCOUNTER — Ambulatory Visit: Payer: Managed Care, Other (non HMO) | Admitting: Family Medicine

## 2020-07-05 ENCOUNTER — Encounter: Payer: Self-pay | Admitting: Family Medicine

## 2020-07-05 VITALS — BP 116/52 | HR 68 | Temp 98.9°F | Resp 16 | Wt 205.0 lb

## 2020-07-05 DIAGNOSIS — I480 Paroxysmal atrial fibrillation: Secondary | ICD-10-CM

## 2020-07-05 DIAGNOSIS — K5909 Other constipation: Secondary | ICD-10-CM

## 2020-07-05 DIAGNOSIS — E1159 Type 2 diabetes mellitus with other circulatory complications: Secondary | ICD-10-CM

## 2020-07-05 DIAGNOSIS — I152 Hypertension secondary to endocrine disorders: Secondary | ICD-10-CM

## 2020-07-05 DIAGNOSIS — Z6832 Body mass index (BMI) 32.0-32.9, adult: Secondary | ICD-10-CM

## 2020-07-05 DIAGNOSIS — E1169 Type 2 diabetes mellitus with other specified complication: Secondary | ICD-10-CM | POA: Diagnosis not present

## 2020-07-05 DIAGNOSIS — E785 Hyperlipidemia, unspecified: Secondary | ICD-10-CM

## 2020-07-05 DIAGNOSIS — E669 Obesity, unspecified: Secondary | ICD-10-CM

## 2020-07-05 NOTE — Assessment & Plan Note (Signed)
Discussed importance of healthy weight management Discussed diet and exercise  

## 2020-07-05 NOTE — Assessment & Plan Note (Signed)
Previously uncontrolled Also followed by cardiology Remote history of not tolerating Crestor due to myalgias Did not tolerate atorvastatin due to GI upset Previously tolerated pravastatin well, but max dose did not reduce LDL sufficiently Goal LDL less than 70 in the setting of diabetes Tolerating low-dose Crestor, which we will continue current dose Continue co-Q10 to reduce incidence of myalgia Recheck FLP Reviewed recent CMP

## 2020-07-05 NOTE — Assessment & Plan Note (Signed)
Previously well controlled Continue current medications Recheck A1c On ACE inhibitor and statin Follow-up in 6 months

## 2020-07-05 NOTE — Assessment & Plan Note (Signed)
Chronic and fairly well controlled Having some diarrhea, but prefers this to constipation Declines GI f/u at this time Continue Linzess at current dose

## 2020-07-05 NOTE — Progress Notes (Signed)
Established patient visit   Patient: Julia Stark   DOB: 1961-03-22   60 y.o. Female  MRN: [id number] Visit Date: 07/05/2020  Today's healthcare provider: Lavon Paganini, MD   Chief Complaint  Patient presents with  . Follow-up   Subjective    HPI  Follow up for constipation  The patient was last seen for this 3 months ago. Changes made at last visit include continuing Linzess 52mcg daily.    She reports good compliance with treatment. She feels that condition is Unchanged. [name] not having side effects.    Social History   Tobacco Use  . Smoking status: Never Smoker  . Smokeless tobacco: Never Used  Vaping Use  . Vaping Use: Never used  Substance Use Topics  . Alcohol use: Yes    Alcohol/week: 4.0 standard drinks    Types: 2 Glasses of wine, 2 Cans of beer per week  . Drug use: No       Medications: Outpatient Medications Prior to Visit  Medication Sig  . apixaban (ELIQUIS) 5 MG TABS tablet Take 5 mg by mouth 2 (two) times daily.  Marland Kitchen linaclotide (LINZESS) 72 MCG capsule Take 1 capsule (72 mcg total) by mouth daily before breakfast.  . metFORMIN (GLUCOPHAGE) 1000 MG tablet TAKE 1 TABLET(1000 MG) BY MOUTH DAILY  . MULTAQ 400 MG tablet Take 400 mg by mouth 2 (two) times daily.  . valACYclovir (VALTREX) 500 MG tablet TAKE 1 TABLET(500 MG) BY MOUTH DAILY  . venlafaxine XR (EFFEXOR XR) 75 MG 24 hr capsule Take 1 capsule (75 mg total) by mouth daily with breakfast.  . [DISCONTINUED] ramipril (ALTACE) 10 MG capsule TAKE 1 CAPSULE(10 MG) BY MOUTH DAILY (Patient taking differently: Take 10 mg by mouth daily.)  . [DISCONTINUED] cephALEXin (KEFLEX) 500 MG capsule Take 1 capsule (500 mg total) by mouth 3 (three) times daily. (Patient not taking: Reported on 07/05/2020)  . [DISCONTINUED] diltiazem (CARDIZEM CD) 120 MG 24 hr capsule Take 120 mg by mouth daily. (Patient not taking: Reported on 07/05/2020)  . [DISCONTINUED] diltiazem (TIAZAC) 120 MG 24 hr capsule Take 1  capsule by mouth in the morning. (Patient not taking: No sig reported)  . [DISCONTINUED] EQ FIBER SUPPLEMENT PO Take by mouth. (Patient not taking: No sig reported)  . [DISCONTINUED] fluticasone (FLONASE) 50 MCG/ACT nasal spray Place 2 sprays into both nostrils daily. (Patient not taking: Reported on 07/05/2020)  . [DISCONTINUED] metoprolol tartrate (LOPRESSOR) 25 MG tablet Take 25 mg by mouth 2 (two) times daily. (Patient not taking: Reported on 07/05/2020)  . [DISCONTINUED] Multiple Vitamin (MULTIVITAMIN) tablet Take 1 tablet by mouth at bedtime. One-A-Day Women's 50+ (Patient not taking: No sig reported)  . [DISCONTINUED] rosuvastatin (CRESTOR) 5 MG tablet Take 1 tablet (5 mg total) by mouth daily. (Patient not taking: No sig reported)   No facility-administered medications prior to visit.    Review of Systems  Respiratory: Negative.   Cardiovascular: Negative.   Neurological: Negative.        Objective    BP (!) 116/52   Pulse 68   Temp 98.9 F (37.2 C)   Resp 16   Wt 205 lb (93 kg)   LMP 04/13/2011   BMI 32.11 kg/m     Physical Exam Vitals reviewed.  Constitutional:      General: [name] not in acute distress.    Appearance: Normal appearance. [name] well-developed. [name] not diaphoretic.  HENT:     Head: Normocephalic and  atraumatic.  Eyes:     General: No scleral icterus.    Conjunctiva/sclera: Conjunctivae normal.  Neck:     Thyroid: No thyromegaly.  Cardiovascular:     Rate and Rhythm: Normal rate and regular rhythm.     Pulses: Normal pulses.     Heart sounds: Normal heart sounds. No murmur heard.   Pulmonary:     Effort: Pulmonary effort is normal. No respiratory distress.     Breath sounds: Normal breath sounds. No wheezing, rhonchi or rales.  Musculoskeletal:     Cervical back: Neck supple.     Right lower leg: No edema.     Left lower leg: No edema.  Lymphadenopathy:     Cervical: No cervical adenopathy.  Neurological:     Mental Status: [name]  alert and oriented to person, place, and time. Mental status is at baseline.  Psychiatric:        Mood and Affect: Mood normal.        Behavior: Behavior normal.       No results found for any visits on 07/05/20.  Assessment & Plan     Problem List Items Addressed This Visit      Cardiovascular and Mediastinum   Hypertension associated with diabetes ([place])    Well controlled Continue current medications Reviewed recent metabolic panel F/u in 6 months       Paroxysmal A-fib ([place])    Followed by cardiology S/p ablation and cardioversion Continue eliquis for anticoag In NSR today        Digestive   Chronic constipation - Primary    Chronic and fairly well controlled Having some diarrhea, but prefers this to constipation Declines GI f/u at this time Continue Linzess at current dose        Endocrine   T2DM (type 2 diabetes mellitus) ([place])    Previously well controlled Continue current medications Recheck A1c On ACE inhibitor and statin Follow-up in 6 months      Relevant Orders   Hemoglobin A1c   Hyperlipidemia associated with type 2 diabetes mellitus ([place])    Previously uncontrolled Also followed by cardiology Remote history of not tolerating Crestor due to myalgias Did not tolerate atorvastatin due to GI upset Previously tolerated pravastatin well, but max dose did not reduce LDL sufficiently Goal LDL less than 70 in the setting of diabetes Tolerating low-dose Crestor, which we will continue current dose Continue co-Q10 to reduce incidence of myalgia Recheck FLP Reviewed recent CMP      Relevant Orders   Lipid panel     Other   Class 1 obesity with serious comorbidity and body mass index (BMI) of 32.0 to 32.9 in adult    Discussed importance of healthy weight management Discussed diet and exercise           Return in about 6 months (around 01/04/2021) for CPE.      I, Lavon Paganini, MD, have reviewed all documentation for this visit. The  documentation on 07/05/20 for the exam, diagnosis, procedures, and orders are all accurate and complete.   Khandi Kernes, Dionne Bucy, MD, MPH [facility]

## 2020-07-05 NOTE — Assessment & Plan Note (Signed)
Followed by cardiology S/p ablation and cardioversion Continue eliquis for anticoag In NSR today

## 2020-07-05 NOTE — Assessment & Plan Note (Signed)
Well controlled Continue current medications Reviewed recent metabolic panel F/u in 6 months  

## 2020-07-06 ENCOUNTER — Encounter: Payer: Self-pay | Admitting: Family Medicine

## 2020-07-06 NOTE — Telephone Encounter (Signed)
Ok to give work note for today

## 2020-07-12 ENCOUNTER — Other Ambulatory Visit: Payer: Self-pay

## 2020-07-12 ENCOUNTER — Ambulatory Visit
Admission: RE | Admit: 2020-07-12 | Discharge: 2020-07-12 | Disposition: A | Discharge status: Home / Self Care | Payer: Managed Care, Other (non HMO) | Source: Ambulatory Visit | Attending: Family Medicine | Admitting: Family Medicine

## 2020-07-12 DIAGNOSIS — Z1231 Encounter for screening mammogram for malignant neoplasm of breast: Secondary | ICD-10-CM | POA: Diagnosis not present

## 2020-07-26 ENCOUNTER — Telehealth: Payer: Self-pay | Admitting: Family Medicine

## 2020-07-26 MED ORDER — LINACLOTIDE 72 MCG PO CAPS: 72.0000 ug | ORAL_CAPSULE | Freq: Every day | ORAL | 1 refills | Status: DC

## 2020-07-26 NOTE — Telephone Encounter (Signed)
[facility] faxed refill request for the following medications:  linaclotide (LINZESS) 72 MCG capsule  Last Rx: 12/29/19 with 1 refill LOV: 07/05/20 NOV: 01/11/21 Please advise. Thanks TNP

## 2020-08-09 ENCOUNTER — Other Ambulatory Visit: Payer: Self-pay | Admitting: Family Medicine

## 2020-09-01 ENCOUNTER — Other Ambulatory Visit: Payer: Managed Care, Other (non HMO)

## 2020-09-01 ENCOUNTER — Telehealth: Payer: Self-pay

## 2020-09-01 ENCOUNTER — Encounter: Payer: Self-pay | Admitting: Physician Assistant

## 2020-09-01 ENCOUNTER — Ambulatory Visit: Payer: Managed Care, Other (non HMO) | Admitting: Physician Assistant

## 2020-09-01 VITALS — BP 108/60 | HR 65 | Ht 68.0 in | Wt 205.0 lb

## 2020-09-01 DIAGNOSIS — K920 Hematemesis: Secondary | ICD-10-CM | POA: Diagnosis not present

## 2020-09-01 DIAGNOSIS — R630 Anorexia: Secondary | ICD-10-CM

## 2020-09-01 NOTE — Progress Notes (Signed)
Chief Complaint: Hematemesis  HPI:    Julia Stark is a 60 year old female with a past medical history as listed below including cholecystectomy, A. fib on Eliquis and diabetes, assigned to Dr. Hilarie Fredrickson at last visit, who presents to clinic today with a complaint of hematemesis.    04/09/2017 colonoscopy with internal hemorrhoids and otherwise normal.  Repeat recommended in 5 years for previous history of adenomatous polyp.    10/31/2019 patient seen in clinic by me for vomiting, constipation and history of colon polyps.  At that time patient discussed that she really just wanted to establish care.  She had chronic constipation for which she typically use ClearLax.  Also discussed some vague epigastric/right upper quadrant pain which comes and goes with some nighttime nausea.  Had also had some awakenings with vomiting which was only with a certain cholesterol medicine and stopped afterwards.  At that time recommend she try using half a dose of ClearLax on a daily basis instead of skipping days, discussed titration.  Also recommend she start Pepcid 20 mg nightly to see if this helps with epigastric pain and nausea.  She was placed in recall for colonoscopy with Dr. Hilarie Fredrickson in 2024.    07/05/2020 patient saw PCP and her chronic constipation was noted to be under control on Linzess.    Today, the patient presents to clinic and explains that for the past 2 to 3 months she has had nausea mainly at night when she lays down to sleep and sometimes will wake her from her sleep about 6 times a month and she will vomit.  Oftentimes when she vomits she will see a little bit of bright red blood in her vomitus.  Explains that she thinks this is related to the addition of Multaq due to her A. fib.  (Previously had described nighttime nausea with vomiting related to her cholesterol medicine) associated symptoms include a decrease in appetite over that same timeframe.    Currently using Linzess every other day to have 4-5 loose  stools and " empty out".    Denies fever, chills, weight loss or change in bowel habits.  Past Medical History:  Diagnosis Date  . Anxiety   . Arthritis    bilateral shoulders  . Atrial fibrillation ([place])   . Cancer ([place])    BASAL CELL CARCINOMA OF FACE  . Colon polyp   . Depression   . Diabetes mellitus without complication ([place])   . Dysrhythmia    A fib  . Hyperlipidemia   . Hypertension   . Sialoadenitis 12/24/2018    Past Surgical History:  Procedure Laterality Date  . BASAL CELL CARCINOMA EXCISION    . CARDIAC ELECTROPHYSIOLOGY MAPPING AND ABLATION  05/2019  . CARDIOVERSION N/A 03/04/2019   Procedure: CARDIOVERSION;  Surgeon: Corey Skains, MD;  Location: [facility] ORS;  Service: Cardiovascular;  Laterality: N/A;  . CARDIOVERSION N/A 06/11/2019   Procedure: CARDIOVERSION;  Surgeon: Corey Skains, MD;  Location: [facility] ORS;  Service: Cardiovascular;  Laterality: N/A;  . CARDIOVERSION N/A 05/25/2020   Procedure: CARDIOVERSION;  Surgeon: Corey Skains, MD;  Location: [facility] ORS;  Service: Cardiovascular;  Laterality: N/A;  . [place]  . CHOLECYSTECTOMY    . COLONOSCOPY  02/12/2012  . COLONOSCOPY WITH PROPOFOL N/A 04/09/2017   Procedure: COLONOSCOPY WITH PROPOFOL;  Surgeon: Manya Silvas, MD;  Location: [facility] ENDOSCOPY;  Service: Endoscopy;  Laterality: N/A;  . ELECTROPHYSIOLOGIC STUDY N/A 05/12/2015   Procedure: Cardioversion;  Surgeon: Everlean Cherry  Nehemiah Massed, MD;  Location: [facility] ORS;  Service: Cardiovascular;  Laterality: N/A;  . TUBAL LIGATION  1999    Current Outpatient Medications  Medication Sig Dispense Refill  . apixaban (ELIQUIS) 5 MG TABS tablet Take 5 mg by mouth 2 (two) times daily.    Marland Kitchen linaclotide (LINZESS) 72 MCG capsule Take 1 capsule (72 mcg total) by mouth daily before breakfast. 90 capsule 1  . metFORMIN (GLUCOPHAGE) 1000 MG tablet TAKE 1 TABLET(1000 MG) BY MOUTH DAILY 90 tablet 0  . MULTAQ 400 MG tablet Take 400 mg by mouth 2 (two) times daily.     . valACYclovir (VALTREX) 500 MG tablet TAKE 1 TABLET(500 MG) BY MOUTH DAILY 90 tablet 0  . venlafaxine XR (EFFEXOR-XR) 75 MG 24 hr capsule TAKE 1 CAPSULE(75 MG) BY MOUTH DAILY WITH BREAKFAST 90 capsule 1   No current facility-administered medications for this visit.    Allergies as of 09/01/2020 - Review Complete 09/01/2020  Allergen Reaction Noted  . Flecainide Nausea And Vomiting 10/23/2019  . Trazodone Other (See Comments) 02/23/2020  . Rosuvastatin  08/22/2017    Family History  Problem Relation Age of Onset  . Diabetes Mother   . Hypertension Mother   . Skin cancer Mother   . Depression Mother   . Alzheimer's disease Father   . Colon cancer Maternal Uncle   . Hypertension Maternal Grandfather   . Hyperlipidemia Maternal Grandfather   . Diabetes Paternal Grandmother   . Heart disease Paternal Grandmother   . Prostate cancer Paternal Grandfather   . Ovarian cancer Cousin   . Diabetes Sister   . Depression Sister   . Diabetes Brother   . Breast cancer Neg Hx   . Cervical cancer Neg Hx     Social History   Socioeconomic History  . Marital status: Divorced    Spouse name: Not on file  . Number of children: 3  . Years of education: 87  . Highest education level: High school graduate  Occupational History    Employer: [employer]    Comment: in special chemistry  Tobacco Use  . Smoking status: Never Smoker  . Smokeless tobacco: Never Used  Vaping Use  . Vaping Use: Never used  Substance and Sexual Activity  . Alcohol use: Yes    Alcohol/week: 4.0 standard drinks    Types: 2 Glasses of wine, 2 Cans of beer per week  . Drug use: No  . Sexual activity: Yes    Partners: Male    Birth control/protection: Post-menopausal, Surgical  Other Topics Concern  . Not on file  Social History Narrative  . Not on file   Social Determinants of Health   Financial Resource Strain: Not on file  Food Insecurity: Not on file  Transportation Needs: Not on file  Physical  Activity: Not on file  Stress: Not on file  Social Connections: Not on file  Intimate Partner Violence: Not on file    Review of Systems:    Constitutional: No weight loss, fever or chills Cardiovascular: No chest pain   Respiratory: No SOB Gastrointestinal: See HPI and otherwise negative   Physical Exam:  Vital signs: BP 108/60   Pulse 65   Ht 5\' 8"  (1.727 m)   Wt 205 lb (93 kg)   LMP 04/13/2011   SpO2 95%   BMI 31.17 kg/m   Constitutional:   Pleasant overweight Caucasian female appears to be in NAD, Well developed, Well nourished, alert and cooperative Respiratory: Respirations even and unlabored. Lungs  clear to auscultation bilaterally.   No wheezes, crackles, or rhonchi.  Cardiovascular: Normal S1, S2. No MRG. Regular rate and rhythm. No peripheral edema, cyanosis or pallor.  Gastrointestinal:  Soft, nondistended, nontender. No rebound or guarding. Normal bowel sounds. No appreciable masses or hepatomegaly. Rectal:  Not performed.  Psychiatric:  Demonstrates good judgement and reason without abnormal affect or behaviors.  RELEVANT LABS AND IMAGING: CBC    Component Value Date/Time   WBC 5.7 05/14/2020 1619   WBC 7.8 10/18/2012 0712   RBC 3.97 05/14/2020 1619   RBC 4.59 10/18/2012 0712   HGB 12.5 05/14/2020 1619   HCT 35.6 05/14/2020 1619   PLT 126 (L) 05/14/2020 1619   MCV 90 05/14/2020 1619   MCV 87 10/18/2012 0712   MCH 31.5 05/14/2020 1619   MCH 30.5 10/18/2012 0712   MCHC 35.1 05/14/2020 1619   MCHC 34.9 10/18/2012 0712   RDW 12.0 05/14/2020 1619   RDW 12.7 10/18/2012 0712   LYMPHSABS 2.3 05/14/2020 1619   LYMPHSABS 3.7 (H) 10/18/2012 0712   MONOABS 0.7 10/18/2012 0712   EOSABS 0.1 05/14/2020 1619   EOSABS 0.2 10/18/2012 0712   BASOSABS 0.0 05/14/2020 1619   BASOSABS 0.1 10/18/2012 0712    CMP     Component Value Date/Time   NA 139 05/14/2020 1619   NA 137 10/18/2012 0712   K 4.0 05/14/2020 1619   K 4.2 10/18/2012 0712   CL 102 05/14/2020 1619    CL 103 10/18/2012 0712   CO2 21 05/14/2020 1619   CO2 27 10/18/2012 0712   GLUCOSE 139 (H) 05/14/2020 1619   GLUCOSE 123 (H) 10/18/2012 0712   BUN 10 05/14/2020 1619   BUN 12 10/18/2012 0712   CREATININE 0.74 05/14/2020 1619   CREATININE 0.73 10/18/2012 0712   CALCIUM 9.2 05/14/2020 1619   CALCIUM 9.5 10/18/2012 0712   PROT 7.6 05/14/2020 1619   PROT 8.7 (H) 10/18/2012 0712   ALBUMIN 4.4 05/14/2020 1619   ALBUMIN 3.9 10/18/2012 0712   AST 38 05/14/2020 1619   AST 44 (H) 10/18/2012 0712   ALT 32 05/14/2020 1619   ALT 46 10/18/2012 0712   ALKPHOS 84 05/14/2020 1619   ALKPHOS 69 10/18/2012 0712   BILITOT 0.9 05/14/2020 1619   BILITOT 0.6 10/18/2012 0712   GFRNONAA 89 05/14/2020 1619   GFRNONAA >60 10/18/2012 0712   GFRAA 103 05/14/2020 1619   GFRAA >60 10/18/2012 8416    Assessment: 1.  Hematemesis: Nausea in the evenings which awakens her about 6 times a month and oftentimes she will see bright red blood in her vomit; consider medications causing nausea and possible esophagitis+/-Mallory-Weiss tears 2.  Decrease in appetite: With above  Plan: 1.  CBC and CMP today. 2.  Scheduled patient for diagnostic EGD in the [place] with Dr. Hilarie Fredrickson.  This was scheduled at the end of July.  Patient was offered a sooner opening with a different physician but she declined and would like to wait for Dr. Hilarie Fredrickson.  Did provide the patient with a detailed list of risks for the procedure and she agrees to proceed. 3.  Would recommend the patient buy over-the-counter Pepcid 20 mg and start taking this at night to see if this helps with symptoms. 4.  Patient was advised to hold her Eliquis for 2 days prior to time of her procedure.  We will communicate with her prescribing physician to ensure this is accepatable for her. 5.  Patient to follow in clinic per recommendations from  Dr. Hilarie Fredrickson after time of procedure  Ellouise Newer, PA-C [facility] 09/01/2020, 3:04 PM  Cc: Virginia Crews, MD

## 2020-09-01 NOTE — Telephone Encounter (Signed)
Patients clearance letter has been sent to Flossie Dibble at [facility] at [phone].

## 2020-09-01 NOTE — Patient Instructions (Addendum)
If you are age 60 or older, your body mass index should be between 23-30. Your Body mass index is 31.17 kg/m. If this is out of the aforementioned range listed, please consider follow up with your Primary Care Provider.  If you are age 39 or younger, your body mass index should be between 19-25. Your Body mass index is 31.17 kg/m. If this is out of the aformentioned range listed, please consider follow up with your Primary Care Provider.  You have been scheduled for an endoscopy. Please follow written instructions given to you at your visit today. If you use inhalers (even only as needed), please bring them with you on the day of your procedure.  Your provider has requested that you go to the basement level for lab work before leaving today. Press "B" on the elevator. The lab is located at the first door on the left as you exit the elevator.  Start over the counter Pepcid 20 mg daily at bedtime.  Due to recent changes in healthcare laws, you may see the results of your imaging and laboratory studies on MyChart before your provider has had a chance to review them.  We understand that in some cases there may be results that are confusing or concerning to you. Not all laboratory results come back in the same time frame and the provider may be waiting for multiple results in order to interpret others.  Please give [place] 48 hours in order for your provider to thoroughly review all the results before contacting the office for clarification of your results.    The [facility] providers would like to encourage you to use [facility] to communicate with providers for non-urgent requests or questions.  Due to long hold times on the telephone, sending your provider a message by [facility] may be a faster and more efficient way to get a response.  Please allow 48 business hours for a response.  Please remember that this is for non-urgent requests.   Thank you for choosing me and [facility].  Ellouise Newer, PA-C

## 2020-09-02 ENCOUNTER — Other Ambulatory Visit: Payer: Self-pay | Admitting: Family Medicine

## 2020-09-02 DIAGNOSIS — E1169 Type 2 diabetes mellitus with other specified complication: Secondary | ICD-10-CM

## 2020-09-02 DIAGNOSIS — E785 Hyperlipidemia, unspecified: Secondary | ICD-10-CM

## 2020-09-02 LAB — CBC WITH DIFFERENTIAL/PLATELET
Basophils Absolute: 0 10*3/uL (ref 0.0–0.2)
Basos: 1 %
EOS (ABSOLUTE): 0.1 10*3/uL (ref 0.0–0.4)
Eos: 3 %
Hematocrit: 38.2 % (ref 34.0–46.6)
Hemoglobin: 12.5 g/dL (ref 11.1–15.9)
Immature Grans (Abs): 0 10*3/uL (ref 0.0–0.1)
Immature Granulocytes: 0 %
Lymphocytes Absolute: 2.5 10*3/uL (ref 0.7–3.1)
Lymphs: 44 %
MCH: 29.5 pg (ref 26.6–33.0)
MCHC: 32.7 g/dL (ref 31.5–35.7)
MCV: 90 fL (ref 79–97)
Monocytes Absolute: 0.6 10*3/uL (ref 0.1–0.9)
Monocytes: 11 %
Neutrophils Absolute: 2.3 10*3/uL (ref 1.4–7.0)
Neutrophils: 41 %
Platelets: 142 10*3/uL — ABNORMAL LOW (ref 150–450)
RBC: 4.24 x10E6/uL (ref 3.77–5.28)
RDW: 12.8 % (ref 11.7–15.4)
WBC: 5.6 10*3/uL (ref 3.4–10.8)

## 2020-09-02 LAB — COMPREHENSIVE METABOLIC PANEL
ALT: 17 IU/L (ref 0–32)
AST: 28 IU/L (ref 0–40)
Albumin/Globulin Ratio: 1.3 (ref 1.2–2.2)
Albumin: 4.3 g/dL (ref 3.8–4.9)
Alkaline Phosphatase: 73 IU/L (ref 44–121)
BUN/Creatinine Ratio: 13 (ref 12–28)
BUN: 9 mg/dL (ref 8–27)
Bilirubin Total: 0.6 mg/dL (ref 0.0–1.2)
CO2: 21 mmol/L (ref 20–29)
Calcium: 9.3 mg/dL (ref 8.7–10.3)
Chloride: 100 mmol/L (ref 96–106)
Creatinine, Ser: 0.7 mg/dL (ref 0.57–1.00)
Globulin, Total: 3.3 g/dL (ref 1.5–4.5)
Glucose: 106 mg/dL — ABNORMAL HIGH (ref 65–99)
Potassium: 4.2 mmol/L (ref 3.5–5.2)
Sodium: 136 mmol/L (ref 134–144)
Total Protein: 7.6 g/dL (ref 6.0–8.5)
eGFR: 99 mL/min/{1.73_m2} (ref >59)

## 2020-09-02 NOTE — Progress Notes (Signed)
Addendum: Reviewed and agree with assessment and management plan. Julia Stark, will you see if EGD is available with me sooner?  Pt did not wish to take earlier appt with another [place] [facility] provider Monta Maiorana, Lajuan Lines, MD

## 2020-09-02 NOTE — Telephone Encounter (Signed)
Requested medications are due for refill today.  Unknown  Requested medications are on the active medications list.  No  Last refill. 12/04/2019  Future visit scheduled.   yes  Notes to clinic.  Refill request states that this medication was discontinued on 07/05/2020, and medication is not on active list. However note from office visit of 07/05/2020 states that patient will continue this medication. Please advise.

## 2020-09-03 NOTE — Progress Notes (Signed)
I have left a voicemail for patient that we currently have availability for endoscopy 09/09/20 with Dr Hilarie Fredrickson. I advised that if she is interested in this appointment or any of our other earlier appointments (we have several before 10-26-20), she needs to call as soon as possible as these do tend to get taken quickly.

## 2020-09-05 ENCOUNTER — Other Ambulatory Visit: Payer: Self-pay | Admitting: Family Medicine

## 2020-09-05 NOTE — Telephone Encounter (Signed)
Requested medication (s) are due for refill today: yes  Requested medication (s) are on the active medication list: yes  Last refill:  05/31/20  Future visit scheduled: yes  Notes to clinic:  overdue lab work (HBA1C)   Requested Prescriptions  Pending Prescriptions Disp Refills   metFORMIN (GLUCOPHAGE) 1000 MG tablet [Pharmacy Med Name: METFORMIN 1000MG TABLETS] 90 tablet 0    Sig: TAKE 1 TABLET(1000 MG) BY MOUTH DAILY      Endocrinology:  Diabetes - Biguanides Failed - 09/05/2020  3:32 AM      Failed - HBA1C is between 0 and 7.9 and within 180 days    Hemoglobin A1C  Date Value Ref Range Status  12/29/2019 6.1  Final          Passed - Cr in normal range and within 360 days    Creatinine  Date Value Ref Range Status  10/18/2012 0.73 0.60 - 1.30 mg/dL Final   Creatinine, Ser  Date Value Ref Range Status  09/01/2020 0.70 0.57 - 1.00 mg/dL Final          Passed - eGFR in normal range and within 360 days    EGFR (African American)  Date Value Ref Range Status  10/18/2012 >60  Final   GFR calc Af Amer  Date Value Ref Range Status  05/14/2020 103 >59 mL/min/1.73 Final    Comment:    **In accordance with recommendations from the NKF-ASN Task force,**   [facility] is in the process of updating its eGFR calculation to the   2021 CKD-EPI creatinine equation that estimates kidney function   without a race variable.    EGFR (Non-African Amer.)  Date Value Ref Range Status  10/18/2012 >60  Final    Comment:    eGFR values <63m/min/1.73 m2 may be an indication of chronic kidney disease (CKD). Calculated eGFR is useful in patients with stable renal function. The eGFR calculation will not be reliable in acutely ill patients when serum creatinine is changing rapidly. It is not useful in  patients on dialysis. The eGFR calculation may not be applicable to patients at the low and high extremes of body sizes, pregnant women, and vegetarians.    GFR calc non Af Amer  Date  Value Ref Range Status  05/14/2020 89 >59 mL/min/1.73 Final   eGFR  Date Value Ref Range Status  09/01/2020 99 >59 mL/min/1.73 Final          Passed - Valid encounter within last 6 months    Recent Outpatient Visits           [facility]   3 months ago Acute right-sided low back pain without sciatica   [facility]   [facility] months [facility]   8 months [facility]   [facility]       Future Appointments             In 4 months Bacigalupo, [facility]

## 2020-09-06 ENCOUNTER — Other Ambulatory Visit: Payer: Self-pay | Admitting: Family Medicine

## 2020-09-06 NOTE — Telephone Encounter (Signed)
Requested Prescriptions  Pending Prescriptions Disp Refills  . metFORMIN (GLUCOPHAGE) 1000 MG tablet [Pharmacy Med Name: METFORMIN 1000MG TABLETS] 90 tablet 0    Sig: TAKE 1 TABLET(1000 MG) BY MOUTH DAILY     Endocrinology:  Diabetes - Biguanides Failed - 09/06/2020 10:34 AM      Failed - HBA1C is between 0 and 7.9 and within 180 days    Hemoglobin A1C  Date Value Ref Range Status  12/29/2019 6.1  Final         Passed - Cr in normal range and within 360 days    Creatinine  Date Value Ref Range Status  10/18/2012 0.73 0.60 - 1.30 mg/dL Final   Creatinine, Ser  Date Value Ref Range Status  09/01/2020 0.70 0.57 - 1.00 mg/dL Final         Passed - eGFR in normal range and within 360 days    EGFR (African American)  Date Value Ref Range Status  10/18/2012 >60  Final   GFR calc Af Amer  Date Value Ref Range Status  05/14/2020 103 >59 mL/min/1.73 Final    Comment:    **In accordance with recommendations from the NKF-ASN Task force,**   [facility] is in the process of updating its eGFR calculation to the   2021 CKD-EPI creatinine equation that estimates kidney function   without a race variable.    EGFR (Non-African Amer.)  Date Value Ref Range Status  10/18/2012 >60  Final    Comment:    eGFR values <50m/min/1.73 m2 may be an indication of chronic kidney disease (CKD). Calculated eGFR is useful in patients with stable renal function. The eGFR calculation will not be reliable in acutely ill patients when serum creatinine is changing rapidly. It is not useful in  patients on dialysis. The eGFR calculation may not be applicable to patients at the low and high extremes of body sizes, pregnant women, and vegetarians.    GFR calc non Af Amer  Date Value Ref Range Status  05/14/2020 89 >59 mL/min/1.73 Final   eGFR  Date Value Ref Range Status  09/01/2020 99 >59 mL/min/1.73 Final         Passed - Valid encounter within last 6 months    Recent Outpatient Visits          2  [facility]   3 months ago Acute right-sided low back pain without sciatica   [facility]   5 months [facility]   8 months [facility] for [facility]   9 months [facility]      Future Appointments            In 4 months [place]

## 2020-09-16 NOTE — Telephone Encounter (Signed)
See cardiology clearance note excerpt from Care Everywhere:  Miscellaneous Notes - documented in this encounter  Table of Contents for Miscellaneous Notes  Telephone Encounter - Archie Balboa, [place] - 09/16/2020 12:03 PM EDT  Telephone Encounter - Archie Balboa, CMA - 09/16/2020 10:12 AM EDT  Telephone Encounter - Sharlet Salina, Amy - 09/16/2020 8:51 AM EDT    Telephone Encounter - Archie Balboa, [place] - 09/16/2020 12:03 PM EDT Formatting of this note might be different from the original. Faxed papers again  Electronically signed by Archie Balboa, [place] at 09/16/2020 12:03 PM EDT  Back to top of Miscellaneous Notes Telephone Encounter - Archie Balboa, [place] - 09/16/2020 10:12 AM EDT Formatting of this note might be different from the original. Left V/M waiting for [facility] to return call advised them that Dr.Kevin Marcello Moores cleared her to hold Eliquis 2 days on 09/03/20 and this was faxed on 09/03/20 Electronically signed by Archie Balboa, [place] at 09/16/2020 10:13 AM EDT  Back to top of Miscellaneous Notes Telephone Encounter Sharlet Salina, Amy - 09/16/2020 8:51 AM EDT Formatting of this note might be different from the original. Clearance ltr to hold Eliquis was sent over 09/01/20. Needs to hold Eliquis for two days. Maya faxing again in case you did not recieve the first time.  Electronically signed by Sharlet Salina, Amy at 09/16/2020 8:54 AM EDT

## 2020-09-16 NOTE — Telephone Encounter (Signed)
We have received faxed response from Dr Mylinda Latina indicating that patient may hold Eliquis 2 days prior to her upcoming endoscopy procedure. I have also included in a previous note an excerpt from [place] with the same information.  I have left a message for patient to call back so I can advise her of this information as well as offer her a sooner appointment than 10/26/20.

## 2020-09-20 NOTE — Telephone Encounter (Signed)
I have spoken to patient to advise that we have gotten Eliquis clearance from cardiology for her to hold the medication 2 days prior to her upcoming endoscopy with Dr Hilarie Fredrickson. Patient verbalizes understanding of this information.  In addition to Eliquis clearance, I have advised patient that we have availability to complete her endoscopy procedure on 09/29/20 (Dr Hilarie Fredrickson requested patient be offered an earlier appointment date than previously scheduled 10/26/20 appointment). Patient states that she has an ENT appointment and is unable to com eon 09/29/20. I have advised patient to contact us every few days to see if there have been any cancellations as it is quite likely we can go forward with her procedure prior to 10/26/20. She verbalizes understanding of this information as well.

## 2020-09-29 ENCOUNTER — Other Ambulatory Visit: Payer: Self-pay | Admitting: Unknown Physician Specialty

## 2020-09-29 DIAGNOSIS — K1121 Acute sialoadenitis: Secondary | ICD-10-CM

## 2020-10-13 ENCOUNTER — Other Ambulatory Visit: Payer: Self-pay

## 2020-10-13 ENCOUNTER — Ambulatory Visit
Admission: RE | Admit: 2020-10-13 | Discharge: 2020-10-13 | Disposition: A | Discharge status: Home / Self Care | Payer: Managed Care, Other (non HMO) | Source: Ambulatory Visit | Attending: Unknown Physician Specialty | Admitting: Unknown Physician Specialty

## 2020-10-13 DIAGNOSIS — K1121 Acute sialoadenitis: Secondary | ICD-10-CM | POA: Diagnosis not present

## 2020-10-13 LAB — POCT I-STAT CREATININE: Creatinine, Ser: 0.6 mg/dL (ref 0.44–1.00)

## 2020-10-13 MED ORDER — IOHEXOL 300 MG/ML  SOLN
75.0000 mL | Freq: Once | INTRAMUSCULAR | Status: AC | PRN
  Administered 2020-10-13: 75 mL via INTRAVENOUS

## 2020-10-26 ENCOUNTER — Encounter: Payer: Self-pay | Admitting: Internal Medicine

## 2020-10-26 ENCOUNTER — Ambulatory Visit (AMBULATORY_SURGERY_CENTER): Payer: Managed Care, Other (non HMO) | Admitting: Internal Medicine

## 2020-10-26 ENCOUNTER — Telehealth: Payer: Self-pay | Admitting: Internal Medicine

## 2020-10-26 ENCOUNTER — Other Ambulatory Visit: Payer: Self-pay

## 2020-10-26 VITALS — BP 116/68 | HR 76 | Temp 97.5°F | Resp 12 | Ht 68.0 in | Wt 205.0 lb

## 2020-10-26 DIAGNOSIS — K920 Hematemesis: Secondary | ICD-10-CM

## 2020-10-26 DIAGNOSIS — K2951 Unspecified chronic gastritis with bleeding: Secondary | ICD-10-CM

## 2020-10-26 DIAGNOSIS — K2970 Gastritis, unspecified, without bleeding: Secondary | ICD-10-CM

## 2020-10-26 DIAGNOSIS — K3189 Other diseases of stomach and duodenum: Secondary | ICD-10-CM

## 2020-10-26 MED ORDER — SODIUM CHLORIDE 0.9 % IV SOLN: 500.0000 mL | Freq: Once | INTRAVENOUS | Status: DC

## 2020-10-26 NOTE — Telephone Encounter (Signed)
Julia Stark, this pt's case was denied by [employer] but a peer to peer can be arranged to further discuss, could you please help arrange that with the provider's schedule? Thank you!  Case# [id number] Peer to peer: [phone] Opt 4

## 2020-10-26 NOTE — Telephone Encounter (Signed)
I faxed office note with all the required information.

## 2020-10-26 NOTE — Progress Notes (Signed)
Called to room to assist during endoscopic procedure.  Patient ID and intended procedure confirmed with present staff. Received instructions for my participation in the procedure from the performing physician.  

## 2020-10-26 NOTE — Progress Notes (Signed)
Report to PACU, RN, vss, BBS= Clear.  

## 2020-10-26 NOTE — Progress Notes (Signed)
[place]  Pt's states no medical or surgical changes since previsit or office visit.  

## 2020-10-26 NOTE — Patient Instructions (Addendum)
Handout given:  Gastritis Resume previous diet Continue current medications Await pathology results Office will schedule abd ultrasound Resume Eliquis tonight   YOU HAD AN ENDOSCOPIC PROCEDURE TODAY AT [place]:   Refer to the procedure report that was given to you for any specific questions about what was found during the examination.  If the procedure report does not answer your questions, please call your gastroenterologist to clarify.  If you requested that your care partner not be given the details of your procedure findings, then the procedure report has been included in a sealed envelope for you to review at your convenience later.  YOU SHOULD EXPECT: Some feelings of bloating in the abdomen. Passage of more gas than usual.  Walking can help get rid of the air that was put into your GI tract during the procedure and reduce the bloating. If you had a lower endoscopy (such as a colonoscopy or flexible sigmoidoscopy) you may notice spotting of blood in your stool or on the toilet paper. If you underwent a bowel prep for your procedure, you may not have a normal bowel movement for a few days.  Please Note:  You might notice some irritation and congestion in your nose or some drainage.  This is from the oxygen used during your procedure.  There is no need for concern and it should clear up in a day or so.  SYMPTOMS TO REPORT IMMEDIATELY:  Following upper endoscopy (EGD)  Vomiting of blood or coffee ground material  New chest pain or pain under the shoulder blades  Painful or persistently difficult swallowing  New shortness of breath  Fever of 100F or higher  Black, tarry-looking stools  For urgent or emergent issues, a gastroenterologist can be reached at any hour by calling [phone]. Do not use MyChart messaging for urgent concerns.   DIET:  We do recommend a small meal at first, but then you may proceed to your regular diet.  Drink plenty of fluids but you  should avoid alcoholic beverages for 24 hours.  ACTIVITY:  You should plan to take it easy for the rest of today and you should NOT DRIVE or use heavy machinery until tomorrow (because of the sedation medicines used during the test).    FOLLOW UP: Our staff will call the number listed on your records 48-72 hours following your procedure to check on you and address any questions or concerns that you may have regarding the information given to you following your procedure. If we do not reach you, we will leave a message.  We will attempt to reach you two times.  During this call, we will ask if you have developed any symptoms of COVID 19. If you develop any symptoms (ie: fever, flu-like symptoms, shortness of breath, cough etc.) before then, please call [phone].  If you test positive for Covid 19 in the 2 weeks post procedure, please call and report this information to [place].    If any biopsies were taken you will be contacted by phone or by letter within the next 1-3 weeks.  Please call us at [phone] if you have not heard about the biopsies in 3 weeks.   SIGNATURES/CONFIDENTIALITY: You and/or your care partner have signed paperwork which will be entered into your electronic medical record.  These signatures attest to the fact that that the information above on your After Visit Summary has been reviewed and is understood.  Full responsibility of the confidentiality of this discharge information  lies with you and/or your care-partner.  

## 2020-10-26 NOTE — Op Note (Signed)
[place] Patient Name: Julia Stark Procedure Date: 10/26/2020 10:12 AM MRN: [id number] Endoscopist: Jerene Bears , MD Age: 60 Referring MD:  Date of Birth: 07-Oct-1960 Gender: Female Account #: [id number] Procedure:                Upper GI endoscopy Indications:              Hematemesis, Nausea with vomiting (intermittently                            and usually vomiting contains red blood), recent                            OTC antiacid has been helpful and reduced episodes Medicines:                Monitored Anesthesia Care Procedure:                Pre-Anesthesia Assessment:                           - Prior to the procedure, a History and Physical                            was performed, and patient medications and                            allergies were reviewed. The patient's tolerance of                            previous anesthesia was also reviewed. The risks                            and benefits of the procedure and the sedation                            options and risks were discussed with the patient.                            All questions were answered, and informed consent                            was obtained. Prior Anticoagulants: The patient has                            taken Eliquis (apixaban), last dose was 2 days                            prior to procedure. ASA Grade Assessment: II - A                            patient with mild systemic disease. After reviewing                            the risks and benefits, the patient was deemed in  satisfactory condition to undergo the procedure.                           After obtaining informed consent, the endoscope was                            passed under direct vision. Throughout the                            procedure, the patient's blood pressure, pulse, and                            oxygen saturations were monitored continuously. The                             GIF [id number] was introduced through the                            mouth, and advanced to the second part of duodenum.                            The upper GI endoscopy was accomplished without                            difficulty. The patient tolerated the procedure                            well. Scope In: Scope Out: Findings:                 The examined esophagus was normal. Z-line is                            regular at 39 cm from the incisors.                           Diffuse moderate inflammation characterized by                            congestion (edema), erythema and friability was                            found in the entire examined stomach. There was                            contact oozing in the gastric body. In the                            prepyloric stomach there was intestinalization of                            the mucosa consistent with metaplasia without                            nodularity or polyp. Biopsies were taken  with a                            cold forceps for histology and Helicobacter pylori                            testing (Jar 1 antrum/incisura, Jar 2 gastric                            body/fundus).                           The examined duodenum was normal. Complications:            No immediate complications. Estimated Blood Loss:     Estimated blood loss was minimal. Impression:               - Normal esophagus.                           - Gastritis. Biopsied.                           - Normal examined duodenum. Recommendation:           - Patient has a contact number available for                            emergencies. The signs and symptoms of potential                            delayed complications were discussed with the                            patient. Return to normal activities tomorrow.                            Written discharge instructions were provided to the                            patient.                            - Resume previous diet.                           - Continue present medications. Determine which OTC                            antacid is being used (and I would continue this                            daily).                           - Await pathology results.                           - Will  await pathology results. Nausea may be                            secondary to gastritis/GERD with hematemesis                            secondary to retching or gastritis in the setting                            of Eliquis.                           - Can resume Eliquis with tonight's dose per                            managing provider.                           - Complete abdominal ultrasound (evaluate hepatic                            parenchyma and exclude changes of portal                            hypertension given gastric mucosal appearance and                            mild low platelets). Jerene Bears, MD 10/26/2020 10:37:22 AM This report has been signed electronically.

## 2020-10-26 NOTE — Telephone Encounter (Signed)
I spoke to a rep at the number provided.  This was not yet an official peer to peer She was able to let me know that is was denied bc the insurance company never received the clinical information, H and P from 09/01/2020  We need to get this to them ASAP This should led to an approval and if not I can then do a peer to peer.  They need: Office note from when she saw Julia Stark, [facility] in June 2022 Fax to [phone] Fax must include: patient name, DOB, case # [id number], and the word "Reconsideration"  Thanks JMP

## 2020-10-26 NOTE — Telephone Encounter (Signed)
I will fax this right now. My apologies.

## 2020-10-28 ENCOUNTER — Telehealth: Payer: Self-pay | Admitting: *Deleted

## 2020-10-28 NOTE — Telephone Encounter (Signed)
  Follow up Call-  Call back number 10/26/2020  Post procedure Call Back phone  # [phone]  Permission to leave phone message Yes  Some recent data might be hidden     Patient questions:  Do you have a fever, pain , or abdominal swelling? No. Pain Score  0 *  Have you tolerated food without any problems? Yes.    Have you been able to return to your normal activities? Yes.    Do you have any questions about your discharge instructions: Diet   No. Medications  No. Follow up visit  No.  Do you have questions or concerns about your Care? No.  Actions: * If pain score is 4 or above: No action needed, pain <4.  Have you developed a fever since your procedure? no  2.   Have you had an respiratory symptoms (SOB or cough) since your procedure? no  3.   Have you tested positive for COVID 19 since your procedure no  4.   Have you had any family members/close contacts diagnosed with the COVID 19 since your procedure?  no   If yes to any of these questions please route to Joylene John, RN and Joella Prince, RN

## 2020-11-08 ENCOUNTER — Ambulatory Visit: Payer: Managed Care, Other (non HMO) | Admitting: Dermatology

## 2020-11-08 ENCOUNTER — Other Ambulatory Visit: Payer: Self-pay

## 2020-11-08 DIAGNOSIS — Z85828 Personal history of other malignant neoplasm of skin: Secondary | ICD-10-CM | POA: Diagnosis not present

## 2020-11-08 DIAGNOSIS — L719 Rosacea, unspecified: Secondary | ICD-10-CM | POA: Diagnosis not present

## 2020-11-08 DIAGNOSIS — D1800 Hemangioma unspecified site: Secondary | ICD-10-CM

## 2020-11-08 DIAGNOSIS — Z1283 Encounter for screening for malignant neoplasm of skin: Secondary | ICD-10-CM

## 2020-11-08 DIAGNOSIS — L570 Actinic keratosis: Secondary | ICD-10-CM | POA: Diagnosis not present

## 2020-11-08 DIAGNOSIS — L578 Other skin changes due to chronic exposure to nonionizing radiation: Secondary | ICD-10-CM

## 2020-11-08 DIAGNOSIS — L820 Inflamed seborrheic keratosis: Secondary | ICD-10-CM | POA: Diagnosis not present

## 2020-11-08 DIAGNOSIS — D229 Melanocytic nevi, unspecified: Secondary | ICD-10-CM

## 2020-11-08 DIAGNOSIS — L814 Other melanin hyperpigmentation: Secondary | ICD-10-CM

## 2020-11-08 DIAGNOSIS — L821 Other seborrheic keratosis: Secondary | ICD-10-CM

## 2020-11-08 DIAGNOSIS — I781 Nevus, non-neoplastic: Secondary | ICD-10-CM

## 2020-11-08 NOTE — Progress Notes (Signed)
Follow-Up Visit   Subjective  Julia Stark is a 60 y.o. female who presents for the following: Follow-up (Patient here today to have tbse. Patient states she has some itchy places on face, some spots on arms, chest, legs, and face maybe from medication. Patient states she has history of bcc on forehead.  Patient states her mom has a history of melanoma. ). Patient here for full body skin exam and skin cancer screening.  The following portions of the chart were reviewed this encounter and updated as appropriate:  Tobacco  Allergies  Meds  Problems  Med Hx  Surg Hx  Fam Hx     Ak with telangectasia at right upper lip  Objective  Well appearing patient in no apparent distress; mood and affect are within normal limits.  A full examination was performed including scalp, head, eyes, ears, nose, lips, neck, chest, axillae, abdomen, back, buttocks, bilateral upper extremities, bilateral lower extremities, hands, feet, fingers, toes, fingernails, and toenails. All findings within normal limits unless otherwise noted below.  face Mid face erythema with telangiectasias +/- scattered inflammatory papules.   left neck x 1 Erythematous keratotic or waxy stuck-on papule or plaque.   Right Upper Lip Erythematous thin papules/macules with gritty scale with telangiectasia    Assessment & Plan  Rosacea face Rosacea is a chronic progressive skin condition usually affecting the face of adults, causing redness and/or acne bumps. It is treatable but not curable. It sometimes affects the eyes (ocular rosacea) as well. It may respond to topical and/or systemic medication and can flare with stress, sun exposure, alcohol, exercise and some foods.  Daily application of broad spectrum spf 30+ sunscreen to face is recommended to reduce flares. Discussed treatment options and BBL laser.  Patient declines treatment at this time.  Inflamed seborrheic keratosis left neck x 1 Destruction of lesion -  left neck x 1  Destruction method: cryotherapy   Informed consent: discussed and consent obtained   Lesion destroyed using liquid nitrogen: Yes   Cryotherapy cycles:  2 Outcome: patient tolerated procedure well with no complications   Post-procedure details: wound care instructions given   Additional details:  Prior to procedure, discussed risks of blister formation, small wound, skin dyspigmentation, or rare scar following cryotherapy. Recommend Vaseline ointment to treated areas while healing.  Actinic keratosis with telangiectasia right upper lip Right Upper Lip Actinic keratoses are precancerous spots that appear secondary to cumulative UV radiation exposure/sun exposure over time. They are chronic with expected duration over 1 year. A portion of actinic keratoses will progress to squamous cell carcinoma of the skin. It is not possible to reliably predict which spots will progress to skin cancer and so treatment is recommended to prevent development of skin cancer.  Recommend daily broad spectrum sunscreen SPF 30+ to sun-exposed areas, reapply every 2 hours as needed.  Recommend staying in the shade or wearing long sleeves, sun glasses (UVA+UVB protection) and wide brim hats (4-inch brim around the entire circumference of the hat). Call for new or changing lesions.  Start 5-fluorouracil/calcipotriene cream twice a day for 7 days to affected areas including right upper lip. Prescription sent to [facility]. Patient advised they will receive an email to purchase the medication online and have it sent to their home. Patient provided with handout reviewing treatment course and side effects and advised to call or message [place] on MyChart with any concerns.  Lentigines - Scattered tan macules - Due to sun exposure - Benign-appering,  observe - Recommend daily broad spectrum sunscreen SPF 30+ to sun-exposed areas, reapply every 2 hours as needed. - Call for any  changes  Seborrheic Keratoses - Stuck-on, waxy, tan-brown papules and/or plaques  - Benign-appearing - Discussed benign etiology and prognosis. - Observe - Call for any changes  Telangiectasia - Dilated blood vessel at face  - Benign appearing on exam - Call for changes  Melanocytic Nevi - Tan-brown and/or pink-flesh-colored symmetric macules and papules - Benign appearing on exam today - Observation - Call clinic for new or changing moles - Recommend daily use of broad spectrum spf 30+ sunscreen to sun-exposed areas.   Hemangiomas - Red papules - Discussed benign nature - Observe - Call for any changes  History of Basal Cell Carcinoma of the Skin - No evidence of recurrence today at left forehead done at [place] regular full body skin exams - Recommend daily broad spectrum sunscreen SPF 30+ to sun-exposed areas, reapply every 2 hours as needed.  - Call if any new or changing lesions are noted between office visits  Actinic Damage - Severe, confluent actinic changes with pre-cancerous actinic keratoses  - Severe, chronic, not at goal, secondary to cumulative UV radiation exposure over time - diffuse scaly erythematous macules and papules with underlying dyspigmentation - Discussed Prescription "Field Treatment" for Severe, Chronic Confluent Actinic Changes with Pre-Cancerous Actinic Keratoses Field treatment involves treatment of an entire area of skin that has confluent Actinic Changes (Sun/ Ultraviolet light damage) and PreCancerous Actinic Keratoses by method of PhotoDynamic Therapy (PDT) and/or prescription Topical Chemotherapy agents such as 5-fluorouracil, 5-fluorouracil/calcipotriene, and/or imiquimod.  The purpose is to decrease the number of clinically evident and subclinical PreCancerous lesions to prevent progression to development of skin cancer by chemically destroying early precancer changes that may or may not be visible.  It has been shown to reduce  the risk of developing skin cancer in the treated area. As a result of treatment, redness, scaling, crusting, and open sores may occur during treatment course. One or more than one of these methods may be used and may have to be used several times to control, suppress and eliminate the PreCancerous changes. Discussed treatment course, expected reaction, and possible side effects. - Recommend daily broad spectrum sunscreen SPF 30+ to sun-exposed areas, reapply every 2 hours as needed.  - Staying in the shade or wearing long sleeves, sun glasses (UVA+UVB protection) and wide brim hats (4-inch brim around the entire circumference of the hat) are also recommended. - Call for new or changing lesions.  - Start 5-fluorouracil/calcipotriene cream twice a day for 7 days to affected areas including right upper lip. Prescription sent to [facility]. Patient advised they will receive an email to purchase the medication online and have it sent to their home. Patient provided with handout reviewing treatment course and side effects and advised to call or message [place] on MyChart with any concerns.  History of Basal Cell Carcinoma of the Skin - No evidence of recurrence today at forehead - Recommend regular full body skin exams - Recommend daily broad spectrum sunscreen SPF 30+ to sun-exposed areas, reapply every 2 hours as needed.  - Call if any new or changing lesions are noted between office visits  Skin cancer screening performed today.  Return in about 6 months (around 05/11/2021) for follow up ak tbse . Garry Heater, CMA, am acting as scribe for Sarina Ser, MD. Documentation: I have reviewed the above documentation for accuracy and completeness,  and I agree with the above.  Sarina Ser, MD

## 2020-11-08 NOTE — Patient Instructions (Addendum)
5-Fluorouracil/Calcipotriene Patient Education   Actinic keratoses are the dry, red scaly spots on the skin caused by sun damage. A portion of these spots can turn into skin cancer with time, and treating them can help prevent development of skin cancer.   Treatment of these spots requires removal of the defective skin cells. There are various ways to remove actinic keratoses, including freezing with liquid nitrogen, treatment with creams, or treatment with a blue light procedure in the office.   5-fluorouracil cream is a topical cream used to treat actinic keratoses. It works by interfering with the growth of abnormal fast-growing skin cells, such as actinic keratoses. These cells peel off and are replaced by healthy ones.   5-fluorouracil/calcipotriene is a combination of the 5-fluorouracil cream with a vitamin D analog cream called calcipotriene. The calcipotriene alone does not treat actinic keratoses. However, when it is combined with 5-fluorouracil, it helps the 5-fluorouracil treat the actinic keratoses much faster so that the same results can be achieved with a much shorter treatment time.  INSTRUCTIONS FOR 5-FLUOROURACIL/CALCIPOTRIENE CREAM:   5-fluorouracil/calcipotriene cream typically only needs to be used for 4-7 days. A thin layer should be applied twice a day to the treatment areas recommended by your physician.   If your physician prescribed you separate tubes of 5-fluourouracil and calcipotriene, apply a thin layer of 5-fluorouracil followed by a thin layer of calcipotriene.   Avoid contact with your eyes, nostrils, and mouth. Do not use 5-fluorouracil/calcipotriene cream on infected or open wounds.   You will develop redness, irritation and some crusting at areas where you have pre-cancer damage/actinic keratoses. IF YOU DEVELOP PAIN, BLEEDING, OR SIGNIFICANT CRUSTING, STOP THE TREATMENT EARLY - you have already gotten a good response and the actinic keratoses should clear up  well.  Wash your hands after applying 5-fluorouracil 5% cream on your skin.   A moisturizer or sunscreen with a minimum SPF 30 should be applied each morning.   Once you have finished the treatment, you can apply a thin layer of Vaseline twice a day to irritated areas to soothe and calm the areas more quickly. If you experience significant discomfort, contact your physician.  For some patients it is necessary to repeat the treatment for best results.  SIDE EFFECTS: When using 5-fluorouracil/calcipotriene cream, you may have mild irritation, such as redness, dryness, swelling, or a mild burning sensation. This usually resolves within 2 weeks. The more actinic keratoses you have, the more redness and inflammation you can expect during treatment. Eye irritation has been reported rarely. If this occurs, please let us know.  If you have any trouble using this cream, please call the office. If you have any other questions about this information, please do not hesitate to ask me before you leave the office.  Instructions for [facility] Medications  One or more of your medications was sent to the [facility] mail order compounding pharmacy. You will receive an email from them and can purchase the medicine through that link. It will then be mailed to your home at the address you confirmed. If for any reason you do not receive an email from them, please check your spam folder. If you still do not find the email, please let us know. [facility] phone number is [phone].   Melanoma ABCDEs  Melanoma is the most dangerous type of skin cancer, and is the leading cause of death from skin disease.  You are more likely to develop melanoma if you: Have light-colored skin, light-colored  eyes, or red or blond hair Spend a lot of time in the sun Tan regularly, either outdoors or in a tanning bed Have had blistering sunburns, especially during childhood Have a close family member who has had a  melanoma Have atypical moles or large birthmarks  Early detection of melanoma is key since treatment is typically straightforward and cure rates are extremely high if we catch it early.   The first sign of melanoma is often a change in a mole or a new dark spot.  The ABCDE system is a way of remembering the signs of melanoma.  A for asymmetry:  The two halves do not match. B for border:  The edges of the growth are irregular. C for color:  A mixture of colors are present instead of an even brown color. D for diameter:  Melanomas are usually (but not always) greater than 25m - the size of a pencil eraser. E for evolution:  The spot keeps changing in size, shape, and color.  Please check your skin once per month between visits. You can use a small mirror in front and a large mirror behind you to keep an eye on the back side or your body.   If you see any new or changing lesions before your next follow-up, please call to schedule a visit.  Please continue daily skin protection including broad spectrum sunscreen SPF 30+ to sun-exposed areas, reapplying every 2 hours as needed when you're outdoors.   Staying in the shade or wearing long sleeves, sun glasses (UVA+UVB protection) and wide brim hats (4-inch brim around the entire circumference of the hat) are also recommended for sun protection.    If you have any questions or concerns for your doctor, please call our main line at [phone] press option 4 to reach your doctor's medical assistant. If no one answers, please leave a voicemail as directed and we will return your call as soon as possible. Messages left after 4 pm will be answered the following business day.   You may also send [place] message via [place] We typically respond to MyChart messages within 1-2 business days.  For prescription refills, please ask your pharmacy to contact our office. Our fax number is [phone]  If you have an urgent issue when the clinic is closed  that cannot wait until the next business day, you can page your doctor at the number below.    Please note that while we do our best to be available for urgent issues outside of office hours, we are not available 24/7.   If you have an urgent issue and are unable to reach [place] you may choose to seek medical care at your doctor's office, retail clinic, urgent care center, or emergency room.  If you have a medical emergency, please immediately call 911 or go to the emergency department.  Pager Numbers  - Dr. KNehemiah Massed [phone] - Dr. MLaurence Ferrari [phone] - Dr. SNicole Kindred [phone] In the event of inclement weather, please call our main line at [phone] an update on the status of any delays or closures.  Dermatology Medication Tips: Please keep the boxes that topical medications come in in order to help keep track of the instructions about where and how to use these. Pharmacies typically print the medication instructions only on the boxes and not directly on the medication tubes.   If your medication is too expensive, please contact our office at [phone] 4 or send [place]  message through [place].   We are unable to tell what your co-pay for medications will be in advance as this is different depending on your insurance coverage. However, we may be able to find a substitute medication at lower cost or fill out paperwork to get insurance to cover a needed medication.   If a prior authorization is required to get your medication covered by your insurance company, please allow [place] 1-2 business days to complete this process.  Drug prices often vary depending on where the prescription is filled and some pharmacies may offer cheaper prices.  The website www.goodrx.com contains coupons for medications through different pharmacies. The prices here do not account for what the cost may be with help from insurance (it may be cheaper with your insurance), but the website can give you  the price if you did not use any insurance.  - You can print the associated coupon and take it with your prescription to the pharmacy.  - You may also stop by our office during regular business hours and pick up a GoodRx coupon card.  - If you need your prescription sent electronically to a different pharmacy, notify our office through [facility] or by phone at [phone] option 4.

## 2020-11-09 ENCOUNTER — Encounter: Payer: Self-pay | Admitting: Dermatology

## 2020-11-11 LAB — LIPID PANEL
Chol/HDL Ratio: 4.6 ratio — ABNORMAL HIGH (ref 0.0–4.4)
Cholesterol, Total: 161 mg/dL (ref 100–199)
HDL: 35 mg/dL — ABNORMAL LOW (ref >39)
LDL Chol Calc (NIH): 95 mg/dL (ref 0–99)
Triglycerides: 176 mg/dL — ABNORMAL HIGH (ref 0–149)
VLDL Cholesterol Cal: 31 mg/dL (ref 5–40)

## 2020-11-11 LAB — HEMOGLOBIN A1C
Est. average glucose Bld gHb Est-mCnc: 140 mg/dL
Hgb A1c MFr Bld: 6.5 % — ABNORMAL HIGH (ref 4.8–5.6)

## 2020-11-16 DIAGNOSIS — N200 Calculus of kidney: Secondary | ICD-10-CM | POA: Insufficient documentation

## 2020-11-16 DIAGNOSIS — Z79899 Other long term (current) drug therapy: Secondary | ICD-10-CM | POA: Insufficient documentation

## 2020-11-16 DIAGNOSIS — K115 Sialolithiasis: Secondary | ICD-10-CM | POA: Insufficient documentation

## 2020-11-17 ENCOUNTER — Other Ambulatory Visit: Payer: Self-pay | Admitting: Family Medicine

## 2020-11-17 ENCOUNTER — Other Ambulatory Visit: Payer: Self-pay | Admitting: Physician Assistant

## 2020-11-17 NOTE — Telephone Encounter (Signed)
Requested Prescriptions  Pending Prescriptions Disp Refills  . valACYclovir (VALTREX) 500 MG tablet [Pharmacy Med Name: VALACYCLOVIR '500MG'$  TABLETS] 90 tablet 0    Sig: TAKE 1 TABLET(500 MG) BY MOUTH DAILY     Antimicrobials:  Antiviral Agents - Anti-Herpetic Passed - 11/17/2020  6:40 AM      Passed - Valid encounter within last 12 months    Recent Outpatient Visits          4 months ago Chronic constipation   [facility] [place], Dionne Bucy, MD   6 months ago Acute right-sided low back pain without sciatica   [place], FNP   7 months ago Chronic constipation   [facility] [place], Dionne Bucy, MD   10 months ago Encounter for annual physical exam   [facility], Dionne Bucy, MD   11 months ago Chronic constipation   [facility], Dionne Bucy, MD      Future Appointments            In 1 month Bacigalupo, Dionne Bucy, MD [facility], [place]   In 6 months Ralene Bathe, MD [place]

## 2020-11-19 ENCOUNTER — Encounter: Payer: Self-pay | Admitting: Family Medicine

## 2020-11-23 ENCOUNTER — Ambulatory Visit: Payer: Managed Care, Other (non HMO) | Admitting: Family Medicine

## 2020-11-25 ENCOUNTER — Ambulatory Visit: Payer: Managed Care, Other (non HMO) | Admitting: Anesthesiology

## 2020-11-25 ENCOUNTER — Ambulatory Visit
Admission: RE | Admit: 2020-11-25 | Discharge: 2020-11-25 | Disposition: A | Discharge status: Home / Self Care | Payer: Managed Care, Other (non HMO) | Attending: Internal Medicine | Admitting: Internal Medicine

## 2020-11-25 ENCOUNTER — Other Ambulatory Visit: Payer: Self-pay

## 2020-11-25 ENCOUNTER — Encounter: Payer: Self-pay | Admitting: Internal Medicine

## 2020-11-25 ENCOUNTER — Encounter
Admission: RE | Disposition: A | Discharge status: Home / Self Care | Payer: Self-pay | Source: Home / Self Care | Attending: Internal Medicine

## 2020-11-25 DIAGNOSIS — Z8249 Family history of ischemic heart disease and other diseases of the circulatory system: Secondary | ICD-10-CM | POA: Insufficient documentation

## 2020-11-25 DIAGNOSIS — I480 Paroxysmal atrial fibrillation: Secondary | ICD-10-CM | POA: Insufficient documentation

## 2020-11-25 DIAGNOSIS — E785 Hyperlipidemia, unspecified: Secondary | ICD-10-CM | POA: Insufficient documentation

## 2020-11-25 DIAGNOSIS — Z7984 Long term (current) use of oral hypoglycemic drugs: Secondary | ICD-10-CM | POA: Insufficient documentation

## 2020-11-25 DIAGNOSIS — E119 Type 2 diabetes mellitus without complications: Secondary | ICD-10-CM | POA: Diagnosis not present

## 2020-11-25 DIAGNOSIS — Z85828 Personal history of other malignant neoplasm of skin: Secondary | ICD-10-CM | POA: Diagnosis not present

## 2020-11-25 DIAGNOSIS — Z8042 Family history of malignant neoplasm of prostate: Secondary | ICD-10-CM | POA: Insufficient documentation

## 2020-11-25 DIAGNOSIS — Z7901 Long term (current) use of anticoagulants: Secondary | ICD-10-CM | POA: Diagnosis not present

## 2020-11-25 DIAGNOSIS — Z888 Allergy status to other drugs, medicaments and biological substances status: Secondary | ICD-10-CM | POA: Diagnosis not present

## 2020-11-25 DIAGNOSIS — Z79899 Other long term (current) drug therapy: Secondary | ICD-10-CM | POA: Diagnosis not present

## 2020-11-25 DIAGNOSIS — Z833 Family history of diabetes mellitus: Secondary | ICD-10-CM | POA: Diagnosis not present

## 2020-11-25 DIAGNOSIS — I10 Essential (primary) hypertension: Secondary | ICD-10-CM | POA: Insufficient documentation

## 2020-11-25 DIAGNOSIS — Z808 Family history of malignant neoplasm of other organs or systems: Secondary | ICD-10-CM | POA: Diagnosis not present

## 2020-11-25 DIAGNOSIS — I484 Atypical atrial flutter: Secondary | ICD-10-CM | POA: Diagnosis not present

## 2020-11-25 HISTORY — PX: CARDIOVERSION: SHX1299

## 2020-11-25 LAB — GLUCOSE, CAPILLARY: Glucose-Capillary: 137 mg/dL — ABNORMAL HIGH (ref 70–99)

## 2020-11-25 SURGERY — CARDIOVERSION
Anesthesia: General

## 2020-11-25 MED ORDER — SODIUM CHLORIDE 0.9 % IV SOLN: INTRAVENOUS | Status: DC

## 2020-11-25 MED ORDER — LIDOCAINE HCL (PF) 2 % IJ SOLN
INTRAMUSCULAR | Status: AC
  Filled 2020-11-25: qty 4

## 2020-11-25 MED ORDER — METOPROLOL TARTRATE 5 MG/5ML IV SOLN: 5.0000 mg | Freq: Once | INTRAVENOUS | Status: AC

## 2020-11-25 MED ORDER — LIDOCAINE HCL (CARDIAC) PF 100 MG/5ML IV SOSY
PREFILLED_SYRINGE | INTRAVENOUS | Status: DC | PRN
  Administered 2020-11-25: 40 mg via INTRAVENOUS

## 2020-11-25 MED ORDER — DILTIAZEM HCL ER 120 MG PO CP24: 120.0000 mg | ORAL_CAPSULE | Freq: Two times a day (BID) | ORAL | 3 refills | Status: DC

## 2020-11-25 MED ORDER — METOPROLOL TARTRATE 5 MG/5ML IV SOLN
INTRAVENOUS | Status: AC
  Administered 2020-11-25: 5 mg via INTRAVENOUS
  Filled 2020-11-25: qty 5

## 2020-11-25 MED ORDER — PROPOFOL 10 MG/ML IV BOLUS
INTRAVENOUS | Status: AC
  Filled 2020-11-25: qty 20

## 2020-11-25 MED ORDER — PROPOFOL 10 MG/ML IV BOLUS
INTRAVENOUS | Status: DC | PRN
  Administered 2020-11-25: 30 mg via INTRAVENOUS
  Administered 2020-11-25: 70 mg via INTRAVENOUS

## 2020-11-25 NOTE — CV Procedure (Addendum)
Electrical Cardioversion Procedure Note Julia Stark:[id number] [birth year]  Procedure: Electrical Cardioversion Indications:  Paroxysmal non valvular atrial fibrillation  Procedure Details Consent: Risks of procedure as well as the alternatives and risks of each were explained to the (patient/caregiver).  Consent for procedure obtained. Time Out: Verified patient identification, verified procedure, site/side was marked, verified correct patient position, special equipment/implants available, medications/allergies/relevent history reviewed, required imaging and test results available.  Performed  Patient placed on cardiac monitor, pulse oximetry, supplemental oxygen as necessary.  Sedation given: Propofol and versed as per anesthesia  Pacer pads placed anterior and posterior chest.  Cardioverted  spontaneously when given anesthesia    She also required additional iv metorprolol '10mg'$  for heart rate control in addition to her regular meds for maintenance of nsr    Evaluation Findings: Post procedure EKG shows: NSR Complications: None Patient did tolerate procedure well.   Serafina Royals M.D. [facility] 11/25/2020, 7:56 AM

## 2020-11-25 NOTE — Transfer of Care (Signed)
Immediate Anesthesia Transfer of Care Note  Patient: Julia Stark  Procedure(s) Performed: Procedure(s): CARDIOVERSION (N/A)  Patient Location: PACU and Short Stay  Anesthesia Type:General  Level of Consciousness: awake, alert  and oriented  Airway & Oxygen Therapy: Patient Spontanous Breathing and Patient connected to nasal cannula oxygen  Post-op Assessment: Report given to RN and Post -op Vital signs reviewed and stable  Post vital signs: Reviewed and stable  Last Vitals:  Vitals:   11/25/20 0748 11/25/20 0749  BP:    Pulse: 87 79  Resp: 16 14  Temp:    SpO2: 0000000 [id number]    Complications: No apparent anesthesia complications

## 2020-11-25 NOTE — Anesthesia Preprocedure Evaluation (Addendum)
Anesthesia Evaluation  Patient identified by MRN, date of birth, ID band Patient awake    Reviewed: Allergy & Precautions, H&P , NPO status , Patient's Chart, lab work & pertinent test results  History of Anesthesia Complications Negative for: history of anesthetic complications  Airway Mallampati: I  TM Distance: >3 FB Neck ROM: full    Dental  (+) Teeth Intact   Pulmonary neg pulmonary ROS, neg sleep apnea, neg COPD,    breath sounds clear to auscultation       Cardiovascular hypertension, Pt. on medications (-) angina(-) Past MI and (-) Cardiac Stents + dysrhythmias Atrial Fibrillation  Rhythm:irregular Rate:Normal     Neuro/Psych PSYCHIATRIC DISORDERS Anxiety Depression negative neurological ROS     GI/Hepatic Neg liver ROS, GERD  Medicated,  Endo/Other  diabetes, Oral Hypoglycemic Agents  Renal/GU negative Renal ROS  negative genitourinary   Musculoskeletal  (+) Arthritis , Osteoarthritis,    Abdominal   Peds  Hematology negative hematology ROS (+)   Anesthesia Other Findings . Reflux gastritis  . Chronic constipation  . Depression  . Diabetes mellitus type 2, uncomplicated (CMS-HCC)  . Essential hypertension  . Hyperlipidemia  . Basal cell carcinoma of face  . Recurrent genital HSV (herpes simplex virus) infection  . Paroxysmal A-fib (CMS-HCC)  . Adenomatous polyp of descending colon  . Mobitz type 2 second degree atrioventricular block  . Dizziness  . Allergic rhinitis  . Anxiety, generalized  . Chronic anticoagulation  . Hyperlipidemia associated with type 2 diabetes mellitus (CMS-HCC)  . Red blood cell antibody positive  . Typical atrial flutter (CMS-HCC)  . Bursitis of left hip  . Lipoma of left upper extremity  . Moderate episode of recurrent major depressive disorder (CMS-HCC)  . Morbid obesity (CMS-HCC)  . Primary insomnia  . Ureteral stone  . Status post radiofrequency ablation for  arrhythmia  . On dronedarone therapy  . Salivary gland calculi  . Renal stones      Reproductive/Obstetrics negative OB ROS                            Anesthesia Physical  Anesthesia Plan  ASA: 3  Anesthesia Plan: General   Post-op Pain Management:    Induction: Intravenous  PONV Risk Score and Plan: 2 and Propofol infusion and TIVA  Airway Management Planned: Natural Airway and Nasal Cannula  Additional Equipment:   Intra-op Plan:   Post-operative Plan:   Informed Consent: I have reviewed the patients History and Physical, chart, labs and discussed the procedure including the risks, benefits and alternatives for the proposed anesthesia with the patient or authorized representative who has indicated his/her understanding and acceptance.     Dental Advisory Given  Plan Discussed with: Anesthesiologist, CRNA and Surgeon  Anesthesia Plan Comments:        Anesthesia Quick Evaluation

## 2020-11-25 NOTE — Anesthesia Postprocedure Evaluation (Signed)
Anesthesia Post Note  Patient: Julia Stark  Procedure(s) Performed: CARDIOVERSION  Patient location during evaluation: Phase II Anesthesia Type: General Level of consciousness: awake and alert, awake and oriented Pain management: pain level controlled Vital Signs Assessment: post-procedure vital signs reviewed and stable Respiratory status: spontaneous breathing, nonlabored ventilation and respiratory function stable Cardiovascular status: blood pressure returned to baseline and stable Postop Assessment: no apparent nausea or vomiting Anesthetic complications: no   No notable events documented.   Last Vitals:  Vitals:   11/25/20 0830 11/25/20 0845  BP: 119/68 114/76  Pulse: 67 78  Resp: 13 11  Temp:    SpO2: 96% 95%    Last Pain:  Vitals:   11/25/20 0845  TempSrc:   PainSc: 0-No pain                 Phill Mutter

## 2020-11-25 NOTE — Anesthesia Procedure Notes (Signed)
Date/Time: 11/25/2020 7:39 AM Performed by: Doreen Salvage, CRNA Pre-anesthesia Checklist: Patient identified, Emergency Drugs available, Suction available and Patient being monitored Patient Re-evaluated:Patient Re-evaluated prior to induction Oxygen Delivery Method: Nasal cannula Induction Type: IV induction Dental Injury: Teeth and Oropharynx as per pre-operative assessment  Comments: Nasal cannula with etCO2 monitoring

## 2020-11-26 ENCOUNTER — Ambulatory Visit (INDEPENDENT_AMBULATORY_CARE_PROVIDER_SITE_OTHER): Payer: Managed Care, Other (non HMO) | Admitting: Family Medicine

## 2020-11-26 ENCOUNTER — Other Ambulatory Visit: Payer: Self-pay | Admitting: Family Medicine

## 2020-11-26 ENCOUNTER — Encounter: Payer: Self-pay | Admitting: Family Medicine

## 2020-11-26 VITALS — BP 99/58 | HR 75 | Temp 98.1°F | Resp 16 | Ht 68.0 in | Wt 197.6 lb

## 2020-11-26 DIAGNOSIS — F5101 Primary insomnia: Secondary | ICD-10-CM | POA: Diagnosis not present

## 2020-11-26 DIAGNOSIS — D649 Anemia, unspecified: Secondary | ICD-10-CM

## 2020-11-26 DIAGNOSIS — U071 COVID-19: Secondary | ICD-10-CM

## 2020-11-26 DIAGNOSIS — F331 Major depressive disorder, recurrent, moderate: Secondary | ICD-10-CM

## 2020-11-26 DIAGNOSIS — F411 Generalized anxiety disorder: Secondary | ICD-10-CM | POA: Diagnosis not present

## 2020-11-26 HISTORY — DX: COVID-19: U07.1

## 2020-11-26 MED ORDER — VENLAFAXINE HCL ER 150 MG PO CP24: 150.0000 mg | ORAL_CAPSULE | Freq: Every day | ORAL | 1 refills | Status: DC

## 2020-11-26 MED ORDER — NIRMATRELVIR/RITONAVIR (PAXLOVID)TABLET: 3.0000 | ORAL_TABLET | Freq: Two times a day (BID) | ORAL | 0 refills | Status: AC

## 2020-11-26 NOTE — Assessment & Plan Note (Signed)
Fairly well controlled Increasing effexor as below for depression

## 2020-11-26 NOTE — Assessment & Plan Note (Signed)
Chronic and uncontrolled increase

## 2020-11-26 NOTE — Assessment & Plan Note (Signed)
Trial of trazodone prn Discussed sleep hygiene

## 2020-11-26 NOTE — Progress Notes (Signed)
Established patient visit   Patient: Julia Stark   DOB: February 28, 1961   60 y.o. Female  MRN: [id number] Visit Date: 11/26/2020  Today's healthcare provider: Lavon Paganini, MD   Chief Complaint  Patient presents with   Depression   Subjective  -------------------------------------------------------------------------------------------------------------------- Depression        Associated symptoms include decreased concentration and fatigue.  Associated symptoms include no myalgias and no headaches.   Anxiety/Depression Follow-up  She was last seen for anxiety 13 months ago. Changes made at last visit include increased Effexor to '75mg'$  . [name] agreeable to raise the dosage.   [name] going through a lot at this time. [name] working with cardiology for her A-fib. She reports she feels depressed because she is not getting better and she has undergone multiple procedures since June. [name] frustrated that she can't work.   She feels her anxiety is controlled it is just her depression.    She reports excellent compliance with treatment. She reports excellent tolerance of treatment. [name] not having side effects.  She feels her anxiety is moderate and Worse since last visit.  Symptoms: No chest pain No difficulty concentrating  No dizziness Yes fatigue  No feelings of losing control Yes insomnia  Yes irritable Yes palpitations  No panic attacks No racing thoughts  Yes shortness of breath No sweating  No tremors/shakes    GAD-7 Results GAD-7 Generalized Anxiety Disorder Screening Tool 11/26/2020 08/04/2019 08/22/2017  1. Feeling Nervous, Anxious, or on Edge '1 1 1  '$ 2. Not Being Able to Stop or Control Worrying '1 1 2  '$ 3. Worrying Too Much About Different Things 0 1 1  4. Trouble Relaxing 0 1 1  5. Being So Restless it's Hard To Sit Still 0 0 0  6. Becoming Easily Annoyed or Irritable 1 3 0  7. Feeling Afraid As If Something Awful Might Happen '1 1 1  '$ Total GAD-7 Score '4  8 6  '$ Difficulty At Work, Home, or Getting  Along With Others? Not difficult at all Somewhat difficult Not difficult at all    PHQ-9 Scores PHQ9 SCORE ONLY 11/26/2020 04/05/2020 12/29/2019  PHQ-9 Total Score '9 4 2   '$ Insomnia  She has difficulty sleeping. [name] unable to get in for a sleep study until  November. [name] requesting for a medication to help with sleep. In the past she has taken xanax and trazadone. [name] amenable to beginning trazadone.  ---------------------------------------------------------------------------------------------------   Patient Active Problem List   Diagnosis Date Noted   Ureteral stone 05/15/2020   Moderate episode of recurrent major depressive disorder ([place]) 12/29/2019   Class 1 obesity with serious comorbidity and body mass index (BMI) of 32.0 to 32.9 in adult 12/04/2019   Basal cell carcinoma of face 10/03/2019   Typical atrial flutter ([place]) 08/15/2019   Bursitis of left hip 08/04/2019   Lipoma of left upper extremity 08/04/2019   Primary insomnia 06/23/2019   Red blood cell antibody positive 05/22/2019   Chronic anticoagulation 06/19/2018   Affective disorder, major 08/22/2017   Allergic rhinitis 08/22/2017   Anxiety, generalized 08/22/2017   T2DM (type 2 diabetes mellitus) ([place]) 08/22/2017   Hypertension associated with diabetes ([place]) 08/22/2017   Hyperlipidemia associated with type 2 diabetes mellitus ([place]) 08/22/2017   Mobitz type 2 second degree atrioventricular block 02/05/2017   Paroxysmal A-fib ([place]) 05/25/2015   Recurrent genital HSV (herpes simplex virus) infection 03/13/2014   Chronic constipation 02/17/2014  Reflux gastritis 02/17/2014   Adenomatous polyp of descending colon 02/12/2012   Social History   Tobacco Use   Smoking status: Never   Smokeless tobacco: Never  Vaping Use   Vaping Use: Never used  Substance Use Topics   Alcohol use: Yes    Alcohol/week: 4.0 standard drinks    Types: 2 Glasses of wine, 2 Cans of beer per  week   Drug use: No   Allergies  Allergen Reactions   Flecainide Nausea And Vomiting    Hallucination   Trazodone Other (See Comments)    headaches   Rosuvastatin Other (See Comments)    muscle cramps       Medications: Outpatient Medications Prior to Visit  Medication Sig   apixaban (ELIQUIS) 5 MG TABS tablet Take 5 mg by mouth 2 (two) times daily.   diltiazem (DILACOR XR) 120 MG 24 hr capsule Take 1 capsule (120 mg total) by mouth 2 (two) times daily.   linaclotide (LINZESS) 72 MCG capsule Take 1 capsule (72 mcg total) by mouth daily before breakfast. (Patient taking differently: Take 72 mcg by mouth every other day.)   metFORMIN (GLUCOPHAGE) 1000 MG tablet TAKE 1 TABLET(1000 MG) BY MOUTH DAILY (Patient taking differently: Take 1,000 mg by mouth at bedtime.)   metoprolol tartrate (LOPRESSOR) 25 MG tablet Take 25 mg by mouth as needed (for BPM over 100).   MULTAQ 400 MG tablet Take 400 mg by mouth 2 (two) times daily.   omeprazole (PRILOSEC OTC) 20 MG tablet Take 20 mg by mouth in the morning and at bedtime.   ramipril (ALTACE) 10 MG capsule TAKE 1 CAPSULE(10 MG) BY MOUTH DAILY   valACYclovir (VALTREX) 500 MG tablet Take 500 mg by mouth as needed.   [DISCONTINUED] venlafaxine XR (EFFEXOR-XR) 75 MG 24 hr capsule TAKE 1 CAPSULE(75 MG) BY MOUTH DAILY WITH BREAKFAST   rosuvastatin (CRESTOR) 5 MG tablet TAKE 1 TABLET(5 MG) BY MOUTH DAILY (Patient not taking: Reported on 11/26/2020)   No facility-administered medications prior to visit.    Review of Systems  Constitutional:  Positive for activity change and fatigue. Negative for chills and fever.  HENT:  Negative for ear pain, sinus pressure, sinus pain and sore throat.   Eyes:  Negative for pain and visual disturbance.  Respiratory:  Positive for shortness of breath. Negative for cough, chest tightness and wheezing.   Cardiovascular:  Negative for chest pain, palpitations and leg swelling.  Gastrointestinal:  Negative for abdominal  pain, blood in stool, diarrhea, nausea and vomiting.  Genitourinary:  Negative for flank pain, frequency, pelvic pain and urgency.  Musculoskeletal:  Negative for back pain, myalgias and neck pain.  Neurological:  Negative for dizziness, weakness, light-headedness, numbness and headaches.  Psychiatric/Behavioral:  Positive for decreased concentration, depression and sleep disturbance. The patient is nervous/anxious.        Objective  -------------------------------------------------------------------------------------------------------------------- BP (!) 99/58 (BP Location: Left Arm, Patient Position: Sitting, Cuff Size: Large)   Pulse 75   Temp 98.1 F (36.7 C) (Oral)   Resp 16   Ht '5\' 8"'$  (1.727 m)   Wt 197 lb 9.6 oz (89.6 kg)   LMP 04/13/2011   BMI 30.04 kg/m  BP Readings from Last 3 Encounters:  11/26/20 (!) 99/58  11/25/20 114/76  10/26/20 116/68   Wt Readings from Last 3 Encounters:  11/26/20 197 lb 9.6 oz (89.6 kg)  11/25/20 195 lb (88.5 kg)  10/26/20 205 lb (93 kg)       Physical Exam Vitals  reviewed.  Constitutional:      General: [name] not in acute distress.    Appearance: Normal appearance. [name] well-developed. [name] not diaphoretic.  HENT:     Head: Normocephalic and atraumatic.  Eyes:     General: No scleral icterus.    Conjunctiva/sclera: Conjunctivae normal.  Neck:     Thyroid: No thyromegaly.  Cardiovascular:     Rate and Rhythm: Normal rate. Rhythm irregularly irregular.     Pulses: Normal pulses.     Heart sounds: Normal heart sounds. No murmur heard.    Comments: A-Fib Pulmonary:     Effort: Pulmonary effort is normal. No respiratory distress.     Breath sounds: Normal breath sounds. No wheezing, rhonchi or rales.  Musculoskeletal:     Cervical back: Neck supple.     Right lower leg: No edema.     Left lower leg: No edema.  Lymphadenopathy:     Cervical: No cervical adenopathy.  Skin:    General: Skin is warm and dry.     Findings: No  rash.  Neurological:     Mental Status: [name] alert and oriented to person, place, and time. Mental status is at baseline.  Psychiatric:        Mood and Affect: Mood is depressed. Affect is tearful.        Behavior: Behavior normal.     No results found for any visits on 11/26/20.  Assessment & Plan  ---------------------------------------------------------------------------------------------------------------------- Problem List Items Addressed This Visit       Other   Anxiety, generalized    Fairly well controlled Increasing effexor as below for depression      Relevant Medications   venlafaxine XR (EFFEXOR-XR) 150 MG 24 hr capsule   Primary insomnia    Trial of trazodone prn Discussed sleep hygiene       Moderate episode of recurrent major depressive disorder (HCC) - Primary    Chronic and uncontrolled increase      Relevant Medications   venlafaxine XR (EFFEXOR-XR) 150 MG 24 hr capsule   Other Visit Diagnoses     Anemia, unspecified type       Relevant Orders   CBC        Return in about 2 months (around 01/26/2021) for chronic disease f/u, as scheduled.      I,Essence Turner,acting as a Education administrator for Lavon Paganini, MD.,have documented all relevant documentation on the behalf of Lavon Paganini, MD,as directed by  Lavon Paganini, MD while in the presence of Lavon Paganini, MD.  I, Lavon Paganini, MD, have reviewed all documentation for this visit. The documentation on 11/26/20 for the exam, diagnosis, procedures, and orders are all accurate and complete.   Evangelene Vora, Dionne Bucy, MD, MPH [facility]

## 2020-11-27 LAB — CBC
Hematocrit: 39.5 % (ref 34.0–46.6)
Hemoglobin: 13.2 g/dL (ref 11.1–15.9)
MCH: 29.1 pg (ref 26.6–33.0)
MCHC: 33.4 g/dL (ref 31.5–35.7)
MCV: 87 fL (ref 79–97)
Platelets: 168 10*3/uL (ref 150–450)
RBC: 4.53 x10E6/uL (ref 3.77–5.28)
RDW: 13.6 % (ref 11.7–15.4)
WBC: 5.7 10*3/uL (ref 3.4–10.8)

## 2020-12-03 ENCOUNTER — Encounter: Payer: Self-pay | Admitting: Family Medicine

## 2020-12-03 MED ORDER — TRAZODONE HCL 50 MG PO TABS: ORAL_TABLET | ORAL | 1 refills | Status: DC

## 2020-12-03 NOTE — Telephone Encounter (Signed)
Please send in trazodone 50 mg tabs (take 25-50 mg qhs prn) #30 r1

## 2021-01-07 ENCOUNTER — Encounter: Payer: Self-pay | Admitting: Advanced Practice Midwife

## 2021-01-07 ENCOUNTER — Other Ambulatory Visit: Payer: Self-pay

## 2021-01-07 ENCOUNTER — Ambulatory Visit (INDEPENDENT_AMBULATORY_CARE_PROVIDER_SITE_OTHER): Payer: Managed Care, Other (non HMO) | Admitting: Advanced Practice Midwife

## 2021-01-07 VITALS — BP 120/80 | Ht 68.0 in | Wt 200.0 lb

## 2021-01-07 DIAGNOSIS — Z0000 Encounter for general adult medical examination without abnormal findings: Secondary | ICD-10-CM | POA: Diagnosis not present

## 2021-01-07 DIAGNOSIS — N941 Unspecified dyspareunia: Secondary | ICD-10-CM

## 2021-01-07 MED ORDER — INTRAROSA 6.5 MG VA INST: 6.5000 mg | VAGINAL_INSERT | Freq: Every day | VAGINAL | 11 refills | Status: DC

## 2021-01-09 ENCOUNTER — Encounter: Payer: Self-pay | Admitting: Advanced Practice Midwife

## 2021-01-09 DIAGNOSIS — Z8679 Personal history of other diseases of the circulatory system: Secondary | ICD-10-CM | POA: Insufficient documentation

## 2021-01-09 DIAGNOSIS — Z9889 Other specified postprocedural states: Secondary | ICD-10-CM | POA: Insufficient documentation

## 2021-01-09 NOTE — Progress Notes (Addendum)
Gynecology Annual Exam  Date of Service: 01/07/2021  PCP: Virginia Crews, MD  Chief Complaint:  Chief Complaint  Patient presents with   Annual Exam    History of Present Illness:Patient is a 60 y.o. No obstetric history on file. presents for annual exam. The patient has complaint today of continued pain with intercourse. She also has night sweats. She does not want to take hormones for HRT. She did try Replens and coconut oil without much relief. I reminded her that products with hyaluronic acid can be helpful. [name] interested in trying [place].   LMP: Patient's last menstrual period was 04/13/2011.   The patient is occasionally sexually active. She admits to dyspareunia.  The patient does perform self breast exams.  There  is uncertain  notable family history of breast or ovarian cancer in her family. Last year she mentioned a cousin with ovarian cancer. Today she reports it is her niece with history of ovarian cancer.  The patient wears seatbelts: yes.   The patient has regular exercise:  she has limited exercise s/p cardio ablation due to shortness of breath, she admits decreased appetite, inadequate intake of vegetables, increased intake of potatoes/starches .    The patient denies current symptoms of depression.  Medication controlled.  Review of Systems: Review of Systems  Constitutional:  Positive for malaise/fatigue. Negative for chills and fever.  HENT:  Negative for congestion, ear discharge, ear pain, hearing loss, sinus pain and sore throat.   Eyes:  Negative for blurred vision and double vision.  Respiratory:  Positive for shortness of breath. Negative for cough and wheezing.   Cardiovascular:  Negative for chest pain, palpitations and leg swelling.  Gastrointestinal:  Positive for constipation. Negative for abdominal pain, blood in stool, diarrhea, heartburn, melena, nausea and vomiting.  Genitourinary:  Negative for dysuria, flank pain, frequency,  hematuria and urgency.       Positive for pain with intercourse  Musculoskeletal:  Positive for joint pain. Negative for back pain and myalgias.  Skin:  Negative for itching and rash.  Neurological:  Negative for dizziness, tingling, tremors, sensory change, speech change, focal weakness, seizures, loss of consciousness, weakness and headaches.  Endo/Heme/Allergies:  Negative for environmental allergies. Does not bruise/bleed easily.       Positive for hot flashes, night sweats  Psychiatric/Behavioral:  Negative for depression, hallucinations, memory loss, substance abuse and suicidal ideas. The patient is not nervous/anxious and does not have insomnia.    Past Medical History:  Patient Active Problem List   Diagnosis Date Noted   Status post radiofrequency ablation for arrhythmia 01/09/2021   COVID-19 11/26/2020   On dronedarone therapy 11/16/2020   Renal stones 11/16/2020   Salivary gland calculi 11/16/2020   Ureteral stone 05/15/2020   Moderate episode of recurrent major depressive disorder ([place]) 12/29/2019   Class 1 obesity with serious comorbidity and body mass index (BMI) of 32.0 to 32.9 in adult 12/04/2019   Basal cell carcinoma of face 10/03/2019    Formatting of this note might be different from the original. no recurrence    Typical atrial flutter ([place]) 08/15/2019   Bursitis of left hip 08/04/2019   Lipoma of left upper extremity 08/04/2019   Primary insomnia 06/23/2019   Red blood cell antibody positive 05/22/2019    Formatting of this note might be different from the original. Anti-M detected. Please allow 2 hours to prepare appropriate RBCs for transfusion.    Chronic anticoagulation 06/19/2018   Affective disorder,  major 08/22/2017   Allergic rhinitis 08/22/2017   Anxiety, generalized 08/22/2017   T2DM (type 2 diabetes mellitus) ([place]) 08/22/2017    Urine microalbumin-50 on 08/21/2012.    Hypertension associated with diabetes ([place]) 08/22/2017   Hyperlipidemia  associated with type 2 diabetes mellitus ([place]) 08/22/2017   Mobitz type 2 second degree atrioventricular block 02/05/2017   Paroxysmal A-fib ([place]) 05/25/2015   Recurrent genital HSV (herpes simplex virus) infection 03/13/2014   Chronic constipation 02/17/2014   Reflux gastritis 02/17/2014   Adenomatous polyp of descending colon 02/12/2012    Past Surgical History:  Past Surgical History:  Procedure Laterality Date   BASAL CELL CARCINOMA EXCISION     CARDIAC ELECTROPHYSIOLOGY MAPPING AND ABLATION  05/2019   CARDIOVERSION N/A 03/04/2019   Procedure: CARDIOVERSION;  Surgeon: Corey Skains, MD;  Location: [facility];  Service: Cardiovascular;  Laterality: N/A;   CARDIOVERSION N/A 06/11/2019   Procedure: CARDIOVERSION;  Surgeon: Corey Skains, MD;  Location: [facility];  Service: Cardiovascular;  Laterality: N/A;   CARDIOVERSION N/A 05/25/2020   Procedure: CARDIOVERSION;  Surgeon: Corey Skains, MD;  Location: [facility];  Service: Cardiovascular;  Laterality: N/A;   CARDIOVERSION N/A 11/25/2020   Procedure: CARDIOVERSION;  Surgeon: Corey Skains, MD;  Location: [facility];  Service: Cardiovascular;  Laterality: N/A;   [place]     COLONOSCOPY  02/12/2012   COLONOSCOPY WITH PROPOFOL N/A 04/09/2017   Procedure: COLONOSCOPY WITH PROPOFOL;  Surgeon: Manya Silvas, MD;  Location: [facility];  Service: Endoscopy;  Laterality: N/A;   ELECTROPHYSIOLOGIC STUDY N/A 05/12/2015   Procedure: Cardioversion;  Surgeon: Corey Skains, MD;  Location: [facility];  Service: Cardiovascular;  Laterality: N/A;   [place]    Gynecologic History:  Patient's last menstrual period was 04/13/2011. Last Pap: 2021 Results were:  no abnormalities  Last mammogram: 2022 Results were: BI-RAD I  Obstetric History: No obstetric history on file.  Family History:  Family History  Problem Relation Age of Onset   Diabetes Mother    Hypertension Mother    Skin  cancer Mother    Depression Mother    Alzheimer's disease Father    Colon cancer Maternal Uncle    Hypertension Maternal Grandfather    Hyperlipidemia Maternal Grandfather    Diabetes Paternal Grandmother    Heart disease Paternal Grandmother    Prostate cancer Paternal Grandfather    Ovarian cancer Cousin    Diabetes Sister    Depression Sister    Diabetes Brother    Breast cancer Neg Hx    Cervical cancer Neg Hx     Social History:  Social History   Socioeconomic History   Marital status: Divorced    Spouse name: Not on file   Number of children: 3   Years of education: 12   Highest education level: High school graduate  Occupational History    Employer: [employer]    Comment: in special chemistry  Tobacco Use   Smoking status: Never   Smokeless tobacco: Never  Vaping Use   Vaping Use: Never used  Substance and Sexual Activity   Alcohol use: Yes    Alcohol/week: 4.0 standard drinks    Types: 2 Glasses of wine, 2 Cans of beer per week   Drug use: No   Sexual activity: Yes    Partners: Male    Birth control/protection: Post-menopausal, Surgical  Other Topics Concern   Not on file  Social History Narrative  Not on file   Social Determinants of Health   Financial Resource Strain: Not on file  Food Insecurity: Not on file  Transportation Needs: Not on file  Physical Activity: Not on file  Stress: Not on file  Social Connections: Not on file  Intimate Partner Violence: Not on file    Allergies:  Allergies  Allergen Reactions   Flecainide Nausea And Vomiting    Hallucination   Trazodone Other (See Comments)    headaches   Rosuvastatin Other (See Comments)    muscle cramps    Medications: Prior to Admission medications   Medication Sig Start Date End Date Taking? Authorizing Provider  apixaban (ELIQUIS) 5 MG TABS tablet Take 5 mg by mouth 2 (two) times daily.   Yes [provider]  diltiazem (DILACOR XR) 120 MG 24 hr capsule Take 1 capsule  (120 mg total) by mouth 2 (two) times daily. 11/25/20  Yes Corey Skains, MD  linaclotide [facility]) 72 MCG capsule Take 1 capsule (72 mcg total) by mouth daily before breakfast. Patient taking differently: Take 72 mcg by mouth every other day. 07/26/20  Yes Birdie Sons, MD  metFORMIN (GLUCOPHAGE) 1000 MG tablet TAKE 1 TABLET(1000 MG) BY MOUTH DAILY Patient taking differently: Take 1,000 mg by mouth at bedtime. 09/06/20  Yes Bacigalupo, Dionne Bucy, MD  MULTAQ 400 MG tablet Take 400 mg by mouth 2 (two) times daily. 05/10/20  Yes [provider]  Prasterone (INTRAROSA) 6.5 MG INST Place 6.5 mg vaginally at bedtime. 01/07/21  Yes Rod Can, CNM  ramipril (ALTACE) 10 MG capsule TAKE 1 CAPSULE(10 MG) BY MOUTH DAILY 11/18/20  Yes Bacigalupo, Dionne Bucy, MD  rosuvastatin (CRESTOR) 5 MG tablet TAKE 1 TABLET(5 MG) BY MOUTH DAILY 09/02/20  Yes Virginia Crews, MD  traZODone (DESYREL) 50 MG tablet Take half a tablet to 1 tablet every night 12/03/20  Yes Birdie Sons, MD  valACYclovir (VALTREX) 500 MG tablet Take 500 mg by mouth as needed.   Yes [provider]  venlafaxine XR (EFFEXOR-XR) 150 MG 24 hr capsule Take 1 capsule (150 mg total) by mouth daily with breakfast. 11/26/20  Yes Bacigalupo, Dionne Bucy, MD  omeprazole (PRILOSEC OTC) 20 MG tablet Take 20 mg by mouth in the morning and at bedtime.    [provider]    Physical Exam Vitals: Blood pressure 120/80, height 5\' 8"  (1.727 m), weight 200 lb (90.7 kg), last menstrual period 04/13/2011.  General: NAD HEENT: normocephalic, anicteric Thyroid: no enlargement, no palpable nodules Pulmonary: No increased work of breathing, CTAB Cardiovascular: RRR, distal pulses 2+ Breast: Breast symmetrical, no tenderness, no palpable nodules or masses, no skin or nipple retraction present, no nipple discharge.  No axillary or supraclavicular lymphadenopathy. Abdomen: NABS, soft, non-tender, non-distended.  Umbilicus without lesions.   No hepatomegaly, splenomegaly or masses palpable. No evidence of hernia  Genitourinary: deferred for no concerns/PAP interval Extremities: no edema, erythema, or tenderness Neurologic: Grossly intact Psychiatric: mood appropriate, affect full    Assessment: 60 y.o. No obstetric history on file. routine annual exam  Plan: Problem List Items Addressed This Visit   None Visit Diagnoses     Well woman exam without gynecological exam    -  Primary   Dyspareunia in female       Relevant Medications   Prasterone (INTRAROSA) 6.5 MG INST       1) Mammogram - recommend yearly screening mammogram.  Mammogram  ordered by PCP  2) STI screening  was  offered and declined  3) ASCCP guidelines and rationale discussed.  Patient opts for every 5 years screening interval  4) Osteoporosis  - per USPTF routine screening DEXA at age 90  Consider FDA-approved medical therapies in postmenopausal women and men aged 60 years and older, based on the following: a) A hip or vertebral (clinical or morphometric) fracture b) T-score ? -2.5 at the femoral neck or spine after appropriate evaluation to exclude secondary causes C) Low bone mass (T-score between -1.0 and -2.5 at the femoral neck or spine) and a 10-year probability of a hip fracture ? 3% or a 10-year probability of a major osteoporosis-related fracture ? 20% based on the [place]-adapted WHO algorithm   5) Routine healthcare maintenance including cholesterol, diabetes screening discussed managed by PCP  6) Colonoscopy per PCP.  Screening recommended starting at age 64 for average risk individuals, age 47 for individuals deemed at increased risk (including African Americans) and recommended to continue until age 48.  For patient age 55-85 individualized approach is recommended.  Gold standard screening is via colonoscopy, Cologuard screening is an acceptable alternative for patient unwilling or unable to undergo colonoscopy.  "Colorectal cancer screening  for average?risk adults: 2018 guideline update from the American Cancer Society"CA: A Cancer Journal for Clinicians: Aug 30, 2016   7) Dyspareunia: Rx IntraRosa, coconut oil lubrication  8) Increase healthy lifestyle: diet, exercise as able, discontinue artificial sweeteners  9) Return in about 1 year (around 01/07/2022) for annual established gyn.    Christean Leaf, CNM [facility] 01/09/21, 3:04 PM

## 2021-01-11 ENCOUNTER — Ambulatory Visit (INDEPENDENT_AMBULATORY_CARE_PROVIDER_SITE_OTHER): Payer: Managed Care, Other (non HMO) | Admitting: Family Medicine

## 2021-01-11 ENCOUNTER — Encounter: Payer: Self-pay | Admitting: Family Medicine

## 2021-01-11 ENCOUNTER — Other Ambulatory Visit: Payer: Self-pay

## 2021-01-11 DIAGNOSIS — Z23 Encounter for immunization: Secondary | ICD-10-CM

## 2021-01-11 DIAGNOSIS — E669 Obesity, unspecified: Secondary | ICD-10-CM | POA: Diagnosis not present

## 2021-01-11 DIAGNOSIS — Z0000 Encounter for general adult medical examination without abnormal findings: Secondary | ICD-10-CM | POA: Diagnosis not present

## 2021-01-11 DIAGNOSIS — E1159 Type 2 diabetes mellitus with other circulatory complications: Secondary | ICD-10-CM | POA: Diagnosis not present

## 2021-01-11 DIAGNOSIS — F5101 Primary insomnia: Secondary | ICD-10-CM

## 2021-01-11 DIAGNOSIS — E785 Hyperlipidemia, unspecified: Secondary | ICD-10-CM

## 2021-01-11 DIAGNOSIS — I152 Hypertension secondary to endocrine disorders: Secondary | ICD-10-CM

## 2021-01-11 DIAGNOSIS — Z7901 Long term (current) use of anticoagulants: Secondary | ICD-10-CM

## 2021-01-11 DIAGNOSIS — Z6830 Body mass index (BMI) 30.0-30.9, adult: Secondary | ICD-10-CM

## 2021-01-11 DIAGNOSIS — E1169 Type 2 diabetes mellitus with other specified complication: Secondary | ICD-10-CM | POA: Diagnosis not present

## 2021-01-11 NOTE — Progress Notes (Signed)
Complete physical exam   Patient: Julia Stark   DOB: 02-10-1961   60 y.o. Female  MRN: [id number] Visit Date: 01/11/2021  Today's healthcare provider: Lavon Paganini, MD   Chief Complaint  Patient presents with   Annual Exam   Subjective    Julia Stark is a 60 y.o. female who presents today for a complete physical exam.  She reports consuming a general diet. Home exercise routine includes walking 1 hrs per week. She generally feels fairly well. She reports sleeping fairly well. She does not have additional problems to discuss today.  HPI  10/03/19 Pap/HPV-negative 07/12/20 Mammogram-BI-RADS 1 04/09/17 Colonoscopy   Past Medical History:  Diagnosis Date   Anxiety    Arthritis    bilateral shoulders   Atrial fibrillation (HCC)    Cancer ([place])    BASAL CELL CARCINOMA OF FACE   Colon polyp    COVID-19 11/26/2020   Depression    Diabetes mellitus without complication ([place])    Dysrhythmia    A fib   Hyperlipidemia    Hypertension    Sialoadenitis 12/24/2018   Past Surgical History:  Procedure Laterality Date   BASAL CELL CARCINOMA EXCISION     CARDIAC ELECTROPHYSIOLOGY MAPPING AND ABLATION  05/2019   CARDIOVERSION N/A 03/04/2019   Procedure: CARDIOVERSION;  Surgeon: Corey Skains, MD;  Location: [facility];  Service: Cardiovascular;  Laterality: N/A;   CARDIOVERSION N/A 06/11/2019   Procedure: CARDIOVERSION;  Surgeon: Corey Skains, MD;  Location: [place] [facility];  Service: Cardiovascular;  Laterality: N/A;   CARDIOVERSION N/A 05/25/2020   Procedure: CARDIOVERSION;  Surgeon: Corey Skains, MD;  Location: [place] [facility];  Service: Cardiovascular;  Laterality: N/A;   CARDIOVERSION N/A 11/25/2020   Procedure: CARDIOVERSION;  Surgeon: Corey Skains, MD;  Location: [facility];  Service: Cardiovascular;  Laterality: N/A;   [place]     COLONOSCOPY  02/12/2012   COLONOSCOPY WITH PROPOFOL N/A 04/09/2017   Procedure: COLONOSCOPY WITH  PROPOFOL;  Surgeon: Manya Silvas, MD;  Location: [facility];  Service: Endoscopy;  Laterality: N/A;   ELECTROPHYSIOLOGIC STUDY N/A 05/12/2015   Procedure: Cardioversion;  Surgeon: Corey Skains, MD;  Location: [facility];  Service: Cardiovascular;  Laterality: N/A;   TUBAL LIGATION  1999   Social History   Socioeconomic History   Marital status: Divorced    Spouse name: Not on file   Number of children: 3   Years of education: 12   Highest education level: High school graduate  Occupational History    Employer: [employer]    Comment: in special chemistry  Tobacco Use   Smoking status: Never   Smokeless tobacco: Never  Vaping Use   Vaping Use: Never used  Substance and Sexual Activity   Alcohol use: Yes    Alcohol/week: 4.0 standard drinks    Types: 2 Glasses of wine, 2 Cans of beer per week   Drug use: No   Sexual activity: Yes    Partners: Male    Birth control/protection: Post-menopausal, Surgical  Other Topics Concern   Not on file  Social History Narrative   Not on file   Social Determinants of Health   Financial Resource Strain: Not on file  Food Insecurity: Not on file  Transportation Needs: Not on file  Physical Activity: Not on file  Stress: Not on file  Social Connections: Not on file  Intimate Partner Violence: Not on file   Family Status  Relation Name Status   Mother  (Not Specified)   Father  (Not Specified)   Mat Uncle  (Not Specified)   MGF  (Not Specified)   PGM  (Not Specified)   PGF  (Not Specified)   Cousin  (Not Specified)   Sister  Alive   Brother  Alive   Neg Hx  (Not Specified)   Family History  Problem Relation Age of Onset   Diabetes Mother    Hypertension Mother    Skin cancer Mother    Depression Mother    Alzheimer's disease Father    Colon cancer Maternal Uncle    Hypertension Maternal Grandfather    Hyperlipidemia Maternal Grandfather    Diabetes Paternal Grandmother    Heart disease Paternal Grandmother     Prostate cancer Paternal Grandfather    Ovarian cancer Cousin    Diabetes Sister    Depression Sister    Diabetes Brother    Breast cancer Neg Hx    Cervical cancer Neg Hx    Allergies  Allergen Reactions   Flecainide Nausea And Vomiting    Hallucination   Trazodone Other (See Comments)    headaches   Rosuvastatin Other (See Comments)    muscle cramps    Patient Care Team: Virginia Crews, MD as PCP - General (Family Medicine)   Medications: Outpatient Medications Prior to Visit  Medication Sig   apixaban (ELIQUIS) 5 MG TABS tablet Take 5 mg by mouth 2 (two) times daily.   diltiazem (DILACOR XR) 120 MG 24 hr capsule Take 1 capsule (120 mg total) by mouth 2 (two) times daily.   linaclotide (LINZESS) 72 MCG capsule Take 1 capsule (72 mcg total) by mouth daily before breakfast. (Patient taking differently: Take 72 mcg by mouth every other day.)   metFORMIN (GLUCOPHAGE) 1000 MG tablet TAKE 1 TABLET(1000 MG) BY MOUTH DAILY (Patient taking differently: Take 1,000 mg by mouth at bedtime.)   MULTAQ 400 MG tablet Take 400 mg by mouth 2 (two) times daily.   omeprazole (PRILOSEC OTC) 20 MG tablet Take 20 mg by mouth in the morning and at bedtime.   Prasterone (INTRAROSA) 6.5 MG INST Place 6.5 mg vaginally at bedtime.   ramipril (ALTACE) 10 MG capsule TAKE 1 CAPSULE(10 MG) BY MOUTH DAILY   rosuvastatin (CRESTOR) 5 MG tablet TAKE 1 TABLET(5 MG) BY MOUTH DAILY   traZODone (DESYREL) 50 MG tablet Take half a tablet to 1 tablet every night   valACYclovir (VALTREX) 500 MG tablet Take 500 mg by mouth as needed.   venlafaxine XR (EFFEXOR-XR) 150 MG 24 hr capsule Take 1 capsule (150 mg total) by mouth daily with breakfast.   No facility-administered medications prior to visit.    Review of Systems  Constitutional:  Positive for activity change, appetite change and diaphoresis.  HENT:  Positive for postnasal drip and rhinorrhea.   Eyes:  Positive for discharge and itching.  Respiratory:   Positive for shortness of breath.   Cardiovascular:  Positive for palpitations.  Gastrointestinal:  Positive for constipation and nausea.  Musculoskeletal:  Positive for arthralgias and myalgias.  Allergic/Immunologic: Positive for environmental allergies.  Neurological:  Positive for dizziness and light-headedness.  Psychiatric/Behavioral:  The patient is nervous/anxious.    Last CBC Lab Results  Component Value Date   WBC 5.7 11/26/2020   HGB 13.2 11/26/2020   HCT 39.5 11/26/2020   MCV 87 11/26/2020   MCH 29.1 11/26/2020   RDW 13.6 11/26/2020   PLT 168 11/26/2020  Last metabolic panel Lab Results  Component Value Date   GLUCOSE 106 (H) 09/01/2020   NA 136 09/01/2020   K 4.2 09/01/2020   CL 100 09/01/2020   CO2 21 09/01/2020   BUN 9 09/01/2020   CREATININE 0.60 10/13/2020   EGFR 99 09/01/2020   GFRNONAA 89 05/14/2020   CALCIUM 9.3 09/01/2020   PROT 7.6 09/01/2020   ALBUMIN 4.3 09/01/2020   LABGLOB 3.3 09/01/2020   AGRATIO 1.3 09/01/2020   BILITOT 0.6 09/01/2020   ALKPHOS 73 09/01/2020   AST 28 09/01/2020   ALT 17 09/01/2020   ANIONGAP 7 10/18/2012   Last lipids Lab Results  Component Value Date   CHOL 161 11/10/2020   HDL 35 (L) 11/10/2020   LDLCALC 95 11/10/2020   TRIG 176 (H) 11/10/2020   CHOLHDL 4.6 (H) 11/10/2020   Last hemoglobin A1c Lab Results  Component Value Date   HGBA1C 6.5 (H) 11/10/2020   Last thyroid functions Lab Results  Component Value Date   TSH 0.92 06/12/2014      Objective    LMP 04/13/2011  BP Readings from Last 3 Encounters:  01/07/21 120/80  11/26/20 (!) 99/58  11/25/20 114/76   Wt Readings from Last 3 Encounters:  01/07/21 200 lb (90.7 kg)  11/26/20 197 lb 9.6 oz (89.6 kg)  11/25/20 195 lb (88.5 kg)      Physical Exam Vitals reviewed.  Constitutional:      General: [name] not in acute distress.    Appearance: Normal appearance. [name] well-developed. [name] not diaphoretic.  HENT:     Head: Normocephalic and  atraumatic.     Right Ear: Tympanic membrane, ear canal and external ear normal.     Left Ear: Tympanic membrane, ear canal and external ear normal.     Nose: Nose normal.     Mouth/Throat:     Mouth: Mucous membranes are moist.     Pharynx: Oropharynx is clear. No oropharyngeal exudate.  Eyes:     General: No scleral icterus.    Conjunctiva/sclera: Conjunctivae normal.     Pupils: Pupils are equal, round, and reactive to light.  Neck:     Thyroid: No thyromegaly.  Cardiovascular:     Rate and Rhythm: Normal rate and regular rhythm.     Pulses: Normal pulses.     Heart sounds: Normal heart sounds. No murmur heard. Pulmonary:     Effort: Pulmonary effort is normal. No respiratory distress.     Breath sounds: Normal breath sounds. No wheezing or rales.  Abdominal:     General: There is no distension.     Palpations: Abdomen is soft.     Tenderness: There is no abdominal tenderness.  Musculoskeletal:        General: No deformity.     Cervical back: Neck supple.     Right lower leg: No edema.     Left lower leg: No edema.  Lymphadenopathy:     Cervical: No cervical adenopathy.  Skin:    General: Skin is warm and dry.     Findings: No rash.  Neurological:     Mental Status: [name] alert and oriented to person, place, and time. Mental status is at baseline.     Sensory: No sensory deficit.     Motor: No weakness.     Gait: Gait normal.  Psychiatric:        Mood and Affect: Mood normal.        Behavior: Behavior normal.  Thought Content: Thought content normal.      Last depression screening scores PHQ 2/9 Scores 01/11/2021 11/26/2020 04/05/2020  PHQ - 2 Score 1 2 0  PHQ- 9 Score 7 9 4    Last fall risk screening Fall Risk  01/11/2021  Falls in the past year? 0  Number falls in past yr: 0  Injury with Fall? 0  Risk for fall due to : No Fall Risks  Follow up Falls evaluation completed   Last Audit-C alcohol use screening Alcohol Use Disorder Test (AUDIT)  01/11/2021  1. How often do you have a drink containing alcohol? 2  2. How many drinks containing alcohol do you have on a typical day when you are drinking? 0  3. How often do you have six or more drinks on one occasion? 0  AUDIT-C Score 2  4. How often during the last year have you found that you were not able to stop drinking once you had started? -  5. How often during the last year have you failed to do what was normally expected from you because of drinking? -  6. How often during the last year have you needed a first drink in the morning to get yourself going after a heavy drinking session? -  7. How often during the last year have you had a feeling of guilt of remorse after drinking? -  8. How often during the last year have you been unable to remember what happened the night before because you had been drinking? -  9. Have you or someone else been injured as a result of your drinking? -  10. Has a relative or friend or a doctor or another health worker been concerned about your drinking or suggested you cut down? -  Alcohol Use Disorder Identification Test Final Score (AUDIT) -  Alcohol Brief Interventions/Follow-up -   A score of 3 or more in women, and 4 or more in men indicates increased risk for alcohol abuse, EXCEPT if all of the points are from question 1   No results found for any visits on 01/11/21.  Assessment & Plan    Routine Health Maintenance and Physical Exam  Exercise Activities and Dietary recommendations  Goals   None     Immunization History  Administered Date(s) Administered   Influenza,inj,Quad PF,6+ Mos 01/17/2017, 02/05/2019, 12/29/2019   PFIZER(Purple Top)SARS-COV-2 Vaccination 05/08/2019, 05/29/2019, 01/08/2020   Pneumococcal Polysaccharide-23 11/28/2011   Tdap 03/13/2014    Health Maintenance  Topic Date Due   Zoster Vaccines- Shingrix (1 of 2) Never done   OPHTHALMOLOGY EXAM  02/20/2020   COVID-19 Vaccine (4 - Booster for Pfizer series)  04/01/2020   INFLUENZA VACCINE  11/01/2020   HEMOGLOBIN A1C  05/13/2021   FOOT EXAM  01/11/2022   COLONOSCOPY (Pts 45-15yr Insurance coverage will need to be confirmed)  04/09/2022   MAMMOGRAM  07/13/2022   TETANUS/TDAP  03/13/2024   PAP SMEAR-Modifier  10/02/2024   Hepatitis C Screening  Completed   HIV Screening  Completed   HPV VACCINES  Aged Out    Discussed health benefits of physical activity, and encouraged her to engage in regular exercise appropriate for her age and condition.  Problem List Items Addressed This Visit       Cardiovascular and Mediastinum   Hypertension associated with diabetes ([place]    Well controlled Continue current medications Reviewed recent metabolic panel        Endocrine   T2DM (type 2 diabetes mellitus) ([place]  Well controlled with last A1c 6.5 Continue current medications/diet control UTD on vaccines, eye exam upcoming, foot exam today On ACEi On Statin Discussed diet and exercise F/u in 6 months       Hyperlipidemia associated with type 2 diabetes mellitus ([place])    Previously well controlled Continue statin Repeat FLP and CMP at next visit Goal LDL < 70        Other   Chronic anticoagulation    Reviewed recent CBC      Primary insomnia    Doing well on trazodone - will continue      Obesity    Discussed importance of healthy weight management Discussed diet and exercise       Other Visit Diagnoses     Encounter for annual health examination    -  Primary   Anemia, unspecified type       Need for influenza vaccination       Relevant Orders   Flu Vaccine QUAD 33moIM (Fluarix, Fluzone & Alfiuria Quad PF)        Return in about 6 months (around 07/12/2021) for chronic disease f/u.     I, ALavon Paganini MD, have reviewed all documentation for this visit. The documentation on 01/11/21 for the exam, diagnosis, procedures, and orders are all accurate and complete.   [place], ADionne Bucy MD, MPH [facility]

## 2021-01-11 NOTE — Assessment & Plan Note (Signed)
Doing well on trazodone - will continue

## 2021-01-11 NOTE — Assessment & Plan Note (Signed)
Well controlled Continue current medications Reviewed recent metabolic panel 

## 2021-01-11 NOTE — Assessment & Plan Note (Signed)
Discussed importance of healthy weight management Discussed diet and exercise  

## 2021-01-11 NOTE — Assessment & Plan Note (Signed)
Reviewed recent CBC

## 2021-01-11 NOTE — Assessment & Plan Note (Signed)
Well controlled with last A1c 6.5 Continue current medications/diet control UTD on vaccines, eye exam upcoming, foot exam today On ACEi On Statin Discussed diet and exercise F/u in 6 months

## 2021-01-11 NOTE — Assessment & Plan Note (Signed)
Previously well controlled Continue statin Repeat FLP and CMP at next visit Goal LDL < 70  

## 2021-02-15 ENCOUNTER — Other Ambulatory Visit: Payer: Self-pay | Admitting: Family Medicine

## 2021-02-15 NOTE — Telephone Encounter (Signed)
Requested medications are due for refill today yes  Requested medications are on the active medication list yes  Last refill 11/17/20  Last visit 01/11/21  Future visit scheduled 07/14/21  Notes to clinic Historical Provider, please assess. Requested Prescriptions  Pending Prescriptions Disp Refills   valACYclovir (VALTREX) 500 MG tablet [Pharmacy Med Name: VALACYCLOVIR 500MG  TABLETS] 90 tablet     Sig: TAKE 1 TABLET(500 MG) BY MOUTH DAILY     Antimicrobials:  Antiviral Agents - Anti-Herpetic Passed - 02/15/2021  6:35 AM      Passed - Valid encounter within last 12 months    Recent Outpatient Visits           1 month ago Encounter for annual health examination   [facility] [place], Dionne Bucy, MD   2 months ago Moderate episode of recurrent major depressive disorder [facility])   [facility], Dionne Bucy, MD   7 months ago Chronic constipation   [facility] [place], Dionne Bucy, MD   9 months ago Acute right-sided low back pain without sciatica   [facility] [place], Kelby Aline, FNP   10 months ago Chronic constipation   [facility], Dionne Bucy, MD       Future Appointments             In 2 weeks Gollan, Kathlene November, MD [facility]   In 3 months Ralene Bathe, MD [place]   In 4 months Bacigalupo, Dionne Bucy, MD [facility] [place]

## 2021-03-02 ENCOUNTER — Ambulatory Visit (INDEPENDENT_AMBULATORY_CARE_PROVIDER_SITE_OTHER): Payer: Managed Care, Other (non HMO) | Admitting: Cardiovascular Disease

## 2021-03-02 ENCOUNTER — Encounter: Payer: Self-pay | Admitting: Cardiovascular Disease

## 2021-03-02 ENCOUNTER — Other Ambulatory Visit: Payer: Self-pay

## 2021-03-02 VITALS — BP 124/60 | HR 82 | Ht 68.0 in | Wt 200.0 lb

## 2021-03-02 DIAGNOSIS — E1169 Type 2 diabetes mellitus with other specified complication: Secondary | ICD-10-CM

## 2021-03-02 DIAGNOSIS — E1159 Type 2 diabetes mellitus with other circulatory complications: Secondary | ICD-10-CM

## 2021-03-02 DIAGNOSIS — E785 Hyperlipidemia, unspecified: Secondary | ICD-10-CM

## 2021-03-02 DIAGNOSIS — I480 Paroxysmal atrial fibrillation: Secondary | ICD-10-CM

## 2021-03-02 DIAGNOSIS — I152 Hypertension secondary to endocrine disorders: Secondary | ICD-10-CM

## 2021-03-02 DIAGNOSIS — I483 Typical atrial flutter: Secondary | ICD-10-CM

## 2021-03-02 NOTE — Progress Notes (Signed)
Cardiology Office Note  Date:  03/02/2021   ID:  Julia Stark, DOB February 03, 1961, MRN [id number]  PCP:  Virginia Crews, MD   Chief Complaint  Patient presents with   New Patient (Initial Visit)    Self referral to establish care for A-Fib/A-flutter & tachycardia. Patient would like a second opinion for a 3rd cardiac ablation.  Medications reviewed by the patient verbally.     HPI:  [name] is a 60 year old woman with past medical history of Paroxysmal atrial fibrillation/flutter Hypertension Hyperlipidemia Diabetes, well controlled Prior ablation x2 performed at [facility] by Dr. Marcello Moores with breakthrough arrhythmia Who presents by referral from Dr. Brita Romp for consultation of her atrial flutter,  shortness of breath, fatigue  Atrial fibrillation management dating back to January 2017, managed at [facility] (dr. Nehemiah Massed) Flecainide intolerance Initial consult with Dr. Marcello Moores at [facility] January 2021 Cardiac MRI performed at that time February 2021, mild biatrial enlargement Overall normal study  08/18/2019 underwent ablation 1. Successful pulmonary vein isolation using wide antral circumferential ablation 2. Diagnosis of typical atrial flutter and creation of bidirectional CTI block  3. No additional ablation lesion set delivered  Notes from cardiology February 2021, breakthrough arrhythmia, was having shortness of breath, and fatigue, started on amiodarone with metoprolol Was changed from metoprolol to diltiazem Had cardioversion March 2021 Given bradycardia, diltiazem held  Normal sinus rhythm February 2022 Since that time back in flutter, feels poorly with fatigue, shortness of breath Was on Multaq, that was changed to [place] Was told by outside cardiology that after anesthesia with cardioversion she had converted on her own, procedure canceled  -[name] continue to feel poorly, elevated heart rate, shortness of breath on exertion  Seen by Dr. Marcello Moores November 03, 2020, had repeat ablation " Atrial flutter on arrival  - Successful transseptal puncture x 2 with ICE guidance  - 3-D mapping of the LA and PV's fused to CT scan  - Baseline isolation of pulmonary veins noted from prior PVI in 2021  - Ablation of posterior wall  - with Isuprel infusion induction of avnrt s/p successful slow pathway ablation  - Isuprel facilitated induction of 2 (SVT) atrial tachycardias 1 possibly junctional tachycardia  No ablation performed due to parahisian location " Discharged on Multaq Post procedure, pulmonary edema with possible postprocedure inflammation requiring colchicine, Lasix  CTA chest November 13, 2020, no significant abnormality, mild pulmonary edema  Reports compliance with her Eliquis  Flecainide intolerance Previously tried amiodarone, Failed Multaq Now on rhythmol, still in atrial flutter  Continues to have symptoms, reports she is much less active secondary to symptoms of shortness of breath on exertion, used to be hiking, avid exercise person.  Has not been able to work well, works at [employer], lots of walking, has had difficulty in the setting of current arrhythmia  EKG personally reviewed by myself on todays visit Atrial flutter ventricular rate 82 bpm, very low amplitude P waves  PMH:   has a past medical history of Anxiety, Arthritis, Atrial fibrillation ([place]), Cancer ([place]), Colon polyp, COVID-19 (11/26/2020), Depression, Diabetes mellitus without complication ([place]), Dysrhythmia, Hyperlipidemia, Hypertension, and Sialoadenitis (12/24/2018).  PSH:    Past Surgical History:  Procedure Laterality Date   BASAL CELL CARCINOMA EXCISION     CARDIAC ELECTROPHYSIOLOGY MAPPING AND ABLATION  05/2019   CARDIOVERSION N/A 03/04/2019   Procedure: CARDIOVERSION;  Surgeon: Corey Skains, MD;  Location: [facility] ORS;  Service: Cardiovascular;  Laterality: N/A;   CARDIOVERSION N/A 06/11/2019   Procedure: CARDIOVERSION;  Surgeon: Corey Skains, MD;  Location:  [facility] ORS;  Service: Cardiovascular;  Laterality: N/A;   CARDIOVERSION N/A 05/25/2020   Procedure: CARDIOVERSION;  Surgeon: Corey Skains, MD;  Location: [facility] ORS;  Service: Cardiovascular;  Laterality: N/A;   CARDIOVERSION N/A 11/25/2020   Procedure: CARDIOVERSION;  Surgeon: Corey Skains, MD;  Location: [facility] ORS;  Service: Cardiovascular;  Laterality: N/A;   [place]     COLONOSCOPY  02/12/2012   COLONOSCOPY WITH PROPOFOL N/A 04/09/2017   Procedure: COLONOSCOPY WITH PROPOFOL;  Surgeon: Manya Silvas, MD;  Location: [facility] ENDOSCOPY;  Service: Endoscopy;  Laterality: N/A;   ELECTROPHYSIOLOGIC STUDY N/A 05/12/2015   Procedure: Cardioversion;  Surgeon: Corey Skains, MD;  Location: [facility] ORS;  Service: Cardiovascular;  Laterality: N/A;   TUBAL LIGATION  1999    Current Outpatient Medications  Medication Sig Dispense Refill   apixaban (ELIQUIS) 5 MG TABS tablet Take 5 mg by mouth 2 (two) times daily.     diltiazem (DILACOR XR) 120 MG 24 hr capsule Take 1 capsule (120 mg total) by mouth 2 (two) times daily. 180 capsule 3   linaclotide (LINZESS) 72 MCG capsule Take 1 capsule (72 mcg total) by mouth daily before breakfast. (Patient taking differently: Take 72 mcg by mouth every other day.) 90 capsule 1   metFORMIN (GLUCOPHAGE) 1000 MG tablet TAKE 1 TABLET(1000 MG) BY MOUTH DAILY (Patient taking differently: Take 1,000 mg by mouth at bedtime.) 90 tablet 0   omeprazole (PRILOSEC OTC) 20 MG tablet Take 20 mg by mouth in the morning and at bedtime.     propafenone (RYTHMOL SR) 225 MG 12 hr capsule Take 225 mg by mouth 2 (two) times daily.     ramipril (ALTACE) 10 MG capsule TAKE 1 CAPSULE(10 MG) BY MOUTH DAILY 90 capsule 1   traZODone (DESYREL) 50 MG tablet Take half a tablet to 1 tablet every night 30 tablet 1   valACYclovir (VALTREX) 500 MG tablet TAKE 1 TABLET(500 MG) BY MOUTH DAILY 90 tablet 1   venlafaxine XR (EFFEXOR-XR) 150 MG 24 hr capsule Take 1 capsule  (150 mg total) by mouth daily with breakfast. 90 capsule 1   Prasterone (INTRAROSA) 6.5 MG INST Place 6.5 mg vaginally at bedtime. (Patient not taking: Reported on 03/02/2021) 30 each 11   rosuvastatin (CRESTOR) 5 MG tablet TAKE 1 TABLET(5 MG) BY MOUTH DAILY (Patient not taking: Reported on 03/02/2021) 90 tablet 1   No current facility-administered medications for this visit.     Allergies:   Flecainide, Trazodone, and Rosuvastatin   Social History:  The patient  reports that she has never smoked. She has never used smokeless tobacco. She reports current alcohol use of about 4.0 standard drinks per week. She reports that she does not use drugs.   Family History:   family history includes Alzheimer's disease in her father; Colon cancer in her maternal uncle; Depression in her mother and sister; Diabetes in her brother, mother, paternal grandmother, and sister; Heart disease in her paternal grandmother; Hyperlipidemia in her maternal grandfather; Hypertension in her maternal grandfather and mother; Ovarian cancer in her cousin; Prostate cancer in her paternal grandfather; Skin cancer in her mother.    Review of Systems: Review of Systems  Constitutional:  Positive for malaise/fatigue.  HENT: Negative.    Respiratory:  Positive for shortness of breath.   Cardiovascular: Negative.   Gastrointestinal: Negative.   Musculoskeletal: Negative.   Neurological: Negative.   Psychiatric/Behavioral: Negative.  All other systems reviewed and are negative.   PHYSICAL EXAM: VS:  BP 124/60 (BP Location: Right Arm, Patient Position: Sitting, Cuff Size: Normal)   Pulse 82   Ht 5\' 8"  (1.727 m)   Wt 200 lb (90.7 kg)   LMP 04/13/2011   SpO2 98%   BMI 30.41 kg/m  , BMI Body mass index is 30.41 kg/m. GEN: Well nourished, well developed, in no acute distress HEENT: normal Neck: no JVD, carotid bruits, or masses Cardiac: RRR; no murmurs, rubs, or gallops,no edema  Respiratory:  clear to auscultation  bilaterally, normal work of breathing GI: soft, nontender, nondistended, + BS MS: no deformity or atrophy Skin: warm and dry, no rash Neuro:  Strength and sensation are intact Psych: euthymic mood, full affect   Recent Labs: 09/01/2020: ALT 17; BUN 9; Potassium 4.2; Sodium 136 10/13/2020: Creatinine, Ser 0.60 11/26/2020: Hemoglobin 13.2; Platelets 168    Lipid Panel Lab Results  Component Value Date   CHOL 161 11/10/2020   HDL 35 (L) 11/10/2020   LDLCALC 95 11/10/2020   TRIG 176 (H) 11/10/2020      Wt Readings from Last 3 Encounters:  03/02/21 200 lb (90.7 kg)  01/07/21 200 lb (90.7 kg)  11/26/20 197 lb 9.6 oz (89.6 kg)       ASSESSMENT AND PLAN:  Problem List Items Addressed This Visit       Cardiology Problems   Hypertension associated with diabetes ([place])   Relevant Medications   propafenone (RYTHMOL SR) 225 MG 12 hr capsule   Hyperlipidemia associated with type 2 diabetes mellitus (HCC)   Relevant Medications   propafenone (RYTHMOL SR) 225 MG 12 hr capsule   Paroxysmal A-fib (HCC) - Primary   Relevant Medications   propafenone (RYTHMOL SR) 225 MG 12 hr capsule   Typical atrial flutter (HCC)   Relevant Medications   propafenone (RYTHMOL SR) 225 MG 12 hr capsule     Other   T2DM (type 2 diabetes mellitus) (HCC)   Atrial flutter Long history of flutter, attempt to ablate by Dr. Marcello Moores x2 unsuccessful Continues to have symptoms of shortness of breath, general malaise Reports that she was placed under anesthesia for cardioversion November 25, 2020 but procedure canceled as she was told she converted back to normal sinus rhythm with anesthesia -I review of EKG shows she likely continued in flutter, rate has slowed Very low amplitude P waves would make it difficult to determine at the time of the procedure --She has tried amiodarone, Multaq, flecainide, now on Rythmol -Recommend she discuss with EP further treatment options -Discussed that options include  cardioversion, repeat ablation for flutter Given her young age, and symptoms, she is not happy to pursue rate control  -For now we have recommended she continue her current medications including Eliquis, Rythmol, diltiazem No indication for need to add Lasix at this time  Obesity We have encouraged continued exercise, careful diet management in an effort to lose weight. -Has been limited secondary to shortness of breath symptoms secondary to arrhythmia as detailed above  Diabetes type 2 Well-controlled, A1c in good range    Total encounter time more than 60 minutes  Greater than 50% was spent in counseling and coordination of care with the patient  Patient seen in consultation for Dr. Brita Romp be referred back to her office for ongoing care of the issues detailed above  Signed, Esmond Plants, M.D., Ph.D. [place], [place]

## 2021-03-02 NOTE — Patient Instructions (Addendum)
We have place a referral to EP, Dr. Quentin Ore  Medication Instructions:  No changes  If you need a refill on your cardiac medications before your next appointment, please call your pharmacy.   Lab work: No new labs needed  Testing/Procedures: No new testing needed  Follow-Up: At [facility], you and your health needs are our priority.  As part of our continuing mission to provide you with exceptional heart care, we have created designated Provider Care Teams.  These Care Teams include your primary Cardiologist (physician) and Advanced Practice Providers (APPs -  Physician Assistants and Nurse Practitioners) who all work together to provide you with the care you need, when you need it.  You will need a follow up appointment as needed  Providers on your designated Care Team:   Murray Hodgkins, NP Christell Faith, PA-C Cadence Kathlen Mody, [place]  COVID-19 Vaccine Information can be found at: [email or web address] For questions related to vaccine distribution or appointments, please email [email or web address] or call [phone].

## 2021-03-14 ENCOUNTER — Encounter: Payer: Self-pay | Admitting: Family Medicine

## 2021-03-14 MED ORDER — LINACLOTIDE 145 MCG PO CAPS: 145.0000 ug | ORAL_CAPSULE | Freq: Every day | ORAL | 1 refills | Status: DC

## 2021-03-16 ENCOUNTER — Institutional Professional Consult (permissible substitution): Payer: Managed Care, Other (non HMO) | Admitting: Cardiology

## 2021-04-12 ENCOUNTER — Encounter: Payer: Self-pay | Admitting: Family Medicine

## 2021-04-13 MED ORDER — TRAZODONE HCL 50 MG PO TABS: ORAL_TABLET | ORAL | 3 refills | Status: DC

## 2021-04-19 NOTE — Progress Notes (Signed)
Electrophysiology Office Note:    Date:  04/20/2021   ID:  Julia Stark, DOB Jul 25, 1960, MRN [id number]  PCP:  Virginia Crews, MD  [facility] Cardiologist:  None  [facility] Electrophysiologist:  Vickie Epley, MD   Referring MD: Virginia Crews, MD   Chief Complaint: Atrial fibrillation and flutter  History of Present Illness:    Julia Stark is a 61 y.o. female who presents for an evaluation of atrial fibrillation and flutter at the request of Dr. Brita Romp. Their medical history includes paroxysmal atrial fibrillation and flutter with 2 previous ablations performed at [facility] by Dr. Marcello Moores.  She presents today for second opinion.  Her atrial fibrillation dates back to January 2017 and has been managed at [facility].  She has a documented intolerance to flecainide.  In May 2021 she underwent an ablation procedure.  During that ablation she had PVI and CTI performed.  She was subsequently started on metoprolol.  This regimen was complicated by bradycardia so the diltiazem was held.  Since February 2022 she has been out of rhythm and atrial flutter.  She was put on propafenone.  In August 2022 she underwent a repeat ablation procedure by Dr. Marcello Moores.  During that procedure the pulmonary veins still showed evidence of electrical isolation.  The posterior wall was ablated during that procedure.  AVNRT was induced during the procedure and the slow pathway was modified.  There were 2 atrial tachycardias and 1 likely junctional tachycardia induced while on Isopril.  No ablation was performed and she was ultimately discharged on Multaq.  Given she has previously tried amiodarone, Multaq, flecainide and now propafenone she is referred to me to discuss alternative treatment strategies.  [name] on Eliquis for stroke prophylaxis.  [name] very short of breath while out of rhythm.     Past Medical History:  Diagnosis Date   Anxiety    Arthritis    bilateral shoulders    Atrial fibrillation ([place])    Cancer ([place])    BASAL CELL CARCINOMA OF FACE   Colon polyp    COVID-19 11/26/2020   Depression    Diabetes mellitus without complication ([place])    Dysrhythmia    A fib   Hyperlipidemia    Hypertension    Sialoadenitis 12/24/2018    Past Surgical History:  Procedure Laterality Date   BASAL CELL CARCINOMA EXCISION     CARDIAC ELECTROPHYSIOLOGY MAPPING AND ABLATION  05/2019   CARDIOVERSION N/A 03/04/2019   Procedure: CARDIOVERSION;  Surgeon: Corey Skains, MD;  Location: [place] ORS;  Service: Cardiovascular;  Laterality: N/A;   CARDIOVERSION N/A 06/11/2019   Procedure: CARDIOVERSION;  Surgeon: Corey Skains, MD;  Location: [place] ORS;  Service: Cardiovascular;  Laterality: N/A;   CARDIOVERSION N/A 05/25/2020   Procedure: CARDIOVERSION;  Surgeon: Corey Skains, MD;  Location: [facility] ORS;  Service: Cardiovascular;  Laterality: N/A;   CARDIOVERSION N/A 11/25/2020   Procedure: CARDIOVERSION;  Surgeon: Corey Skains, MD;  Location: [facility] ORS;  Service: Cardiovascular;  Laterality: N/A;   [place]     COLONOSCOPY  02/12/2012   COLONOSCOPY WITH PROPOFOL N/A 04/09/2017   Procedure: COLONOSCOPY WITH PROPOFOL;  Surgeon: Manya Silvas, MD;  Location: [facility] ENDOSCOPY;  Service: Endoscopy;  Laterality: N/A;   ELECTROPHYSIOLOGIC STUDY N/A 05/12/2015   Procedure: Cardioversion;  Surgeon: Corey Skains, MD;  Location: [facility] ORS;  Service: Cardiovascular;  Laterality: N/A;   [place]  Current Medications: Current Meds  Medication Sig   apixaban (ELIQUIS) 5 MG TABS tablet Take 5 mg by mouth 2 (two) times daily.   diltiazem (DILACOR XR) 120 MG 24 hr capsule Take 1 capsule (120 mg total) by mouth 2 (two) times daily.   linaclotide (LINZESS) 145 MCG CAPS capsule Take 1 capsule (145 mcg total) by mouth daily before breakfast.   metFORMIN (GLUCOPHAGE) 1000 MG tablet TAKE 1 TABLET(1000 MG) BY MOUTH DAILY (Patient taking  differently: Take 1,000 mg by mouth at bedtime.)   omeprazole (PRILOSEC OTC) 20 MG tablet Take 20 mg by mouth in the morning and at bedtime.   propafenone (RYTHMOL SR) 225 MG 12 hr capsule Take 225 mg by mouth 2 (two) times daily.   ramipril (ALTACE) 10 MG capsule TAKE 1 CAPSULE(10 MG) BY MOUTH DAILY   rosuvastatin (CRESTOR) 5 MG tablet TAKE 1 TABLET(5 MG) BY MOUTH DAILY   traZODone (DESYREL) 50 MG tablet Take half a tablet to 1 tablet every night   valACYclovir (VALTREX) 500 MG tablet TAKE 1 TABLET(500 MG) BY MOUTH DAILY   venlafaxine XR (EFFEXOR-XR) 150 MG 24 hr capsule Take 1 capsule (150 mg total) by mouth daily with breakfast.   [DISCONTINUED] MULTAQ 400 MG tablet Take 400 mg by mouth 2 (two) times daily.     Allergies:   Flecainide, Trazodone, and Rosuvastatin   Social History   Socioeconomic History   Marital status: Divorced    Spouse name: Not on file   Number of children: 3   Years of education: 12   Highest education level: High school graduate  Occupational History    Employer: [employer]    Comment: in special chemistry  Tobacco Use   Smoking status: Never   Smokeless tobacco: Never  Vaping Use   Vaping Use: Never used  Substance and Sexual Activity   Alcohol use: Yes    Alcohol/week: 4.0 standard drinks    Types: 2 Glasses of wine, 2 Cans of beer per week   Drug use: No   Sexual activity: Yes    Partners: Male    Birth control/protection: Post-menopausal, Surgical  Other Topics Concern   Not on file  Social History Narrative   Not on file   Social Determinants of Health   Financial Resource Strain: Not on file  Food Insecurity: Not on file  Transportation Needs: Not on file  Physical Activity: Not on file  Stress: Not on file  Social Connections: Not on file     Family History: The patient's family history includes Alzheimer's disease in her father; Colon cancer in her maternal uncle; Depression in her mother and sister; Diabetes in her brother,  mother, paternal grandmother, and sister; Heart disease in her paternal grandmother; Hyperlipidemia in her maternal grandfather; Hypertension in her maternal grandfather and mother; Ovarian cancer in her cousin; Prostate cancer in her paternal grandfather; Skin cancer in her mother. There is no history of Breast cancer or Cervical cancer.  ROS:   Please see the history of present illness.    All other systems reviewed and are negative.  EKGs/Labs/Other Studies Reviewed:    The following studies were reviewed today:  [facility] records  November 02, 2020 transesophageal echo ([facility] report only) CONCLUSIONS ------------------------------------------------------------------    1. Left atrial appendage without evidence of thrombus    2. Smoke observed in left and right atrial appendages    3. No evidence of intracardiac masses or thrombi    4. Preserved biventricular function  5. No pericardial effusion   EKG:  The ekg ordered today demonstrates atrial fibrillation with a ventricular rate of 109 bpm.     Recent Labs: 09/01/2020: ALT 17; BUN 9; Potassium 4.2; Sodium 136 10/13/2020: Creatinine, Ser 0.60 11/26/2020: Hemoglobin 13.2; Platelets 168  Recent Lipid Panel    Component Value Date/Time   CHOL 161 11/10/2020 0846   CHOL 193 10/18/2012 0712   TRIG 176 (H) 11/10/2020 0846   TRIG 215 (H) 10/18/2012 0712   HDL 35 (L) 11/10/2020 0846   HDL 37 (L) 10/18/2012 0712   CHOLHDL 4.6 (H) 11/10/2020 0846   VLDL 43 (H) 10/18/2012 0712   LDLCALC 95 11/10/2020 0846   LDLCALC 113 (H) 10/18/2012 0712    Physical Exam:    VS:  BP 110/74 (BP Location: Left Arm, Patient Position: Sitting, Cuff Size: Normal)    Pulse (!) 109    Ht 5\' 8"  (1.727 m)    Wt 198 lb (89.8 kg)    LMP 04/13/2011    SpO2 96%    BMI 30.11 kg/m     Wt Readings from Last 3 Encounters:  04/20/21 198 lb (89.8 kg)  03/02/21 200 lb (90.7 kg)  01/07/21 200 lb (90.7 kg)     GEN:  Well nourished, well developed in no acute  distress HEENT: Normal NECK: No JVD; No carotid bruits LYMPHATICS: No lymphadenopathy CARDIAC: Irregularly irregular, tachycardic, no murmurs, rubs, gallops RESPIRATORY:  Clear to auscultation without rales, wheezing or rhonchi  ABDOMEN: Soft, non-tender, non-distended MUSCULOSKELETAL:  No edema; No deformity  SKIN: Warm and dry NEUROLOGIC:  Alert and oriented x 3 PSYCHIATRIC:  Normal affect       ASSESSMENT:    1. Persistent atrial fibrillation ([place])   2. Atrial tachycardia (HCC)    PLAN:    In order of problems listed above:  #Persistent atrial fibrillation/flutter and atrial tachycardia Highly symptomatic.  Has had 2 prior ablations at [facility].  Last ablation record reports it parahisian atrial tachycardia that was not ablated because of proximity to the AV node.  [name] been on multiple antiarrhythmic drugs.  She has never tried Production assistant, radio.  We talked about the options including a trial of Tikosyn versus immediately proceeding with redo ablation.  I am in favor of trying dofetilide first to see if we can get a clinical impact on this drug.  If no improvement on Tikosyn therapy, would favor redo ablation attempt with the understanding that there would be a significant risk of pacemaker implant secondary to AV nodal injury.  I discussed the risks of repeat ablation in detail during today's visit.  We will get her set up for Tikosyn admission.  She will hold her propafenone 5 days before starting Tikosyn.  She will continue her Eliquis uninterrupted.  I discussed the pros and cons and risks of Tikosyn in detail during today's appointment.    Medication Adjustments/Labs and Tests Ordered: Current medicines are reviewed at length with the patient today.  Concerns regarding medicines are outlined above.  Orders Placed This Encounter  Procedures   EKG 12-Lead   No orders of the defined types were placed in this encounter.    Signed, Hilton Cork. Quentin Ore, MD, [facility], [facility] 04/20/2021  9:33 AM    Electrophysiology [facility]

## 2021-04-20 ENCOUNTER — Ambulatory Visit: Payer: Managed Care, Other (non HMO) | Admitting: Cardiology

## 2021-04-20 ENCOUNTER — Telehealth: Payer: Self-pay | Admitting: Internal Medicine

## 2021-04-20 ENCOUNTER — Encounter: Payer: Self-pay | Admitting: Cardiology

## 2021-04-20 ENCOUNTER — Other Ambulatory Visit: Payer: Self-pay

## 2021-04-20 ENCOUNTER — Telehealth: Payer: Self-pay | Admitting: Pharmacist

## 2021-04-20 VITALS — BP 110/74 | HR 109 | Ht 68.0 in | Wt 198.0 lb

## 2021-04-20 DIAGNOSIS — I4819 Other persistent atrial fibrillation: Secondary | ICD-10-CM | POA: Diagnosis not present

## 2021-04-20 DIAGNOSIS — I471 Supraventricular tachycardia: Secondary | ICD-10-CM | POA: Diagnosis not present

## 2021-04-20 NOTE — Patient Instructions (Addendum)
Your physician recommends that you continue on your current medications as directed. Please refer to the Current Medication list given to you today. *If you need a refill on your cardiac medications before your next appointment, please call your pharmacy*  Lab Work: None. If you have labs (blood work) drawn today and your tests are completely normal, you will receive your results only by: [place] (if you have MyChart) OR A paper copy in the mail If you have any lab test that is abnormal or we need to change your treatment, we will call you to review the results.  Testing/Procedures: None.  Follow-Up: At [facility], you and your health needs are our priority.  As part of our continuing mission to provide you with exceptional heart care, we have created designated Provider Care Teams.  These Care Teams include your primary Cardiologist (physician) and Advanced Practice Providers (APPs -  Physician Assistants and Nurse Practitioners) who all work together to provide you with the care you need, when you need it.  Your physician wants you to follow-up in: [facility] with be in contact to set up your [facility] hospital stay.   We recommend signing up for the patient portal called "MyChart".  Sign up information is provided on this After Visit Summary.  MyChart is used to connect with patients for Virtual Visits (Telemedicine).  Patients are able to view lab/test results, encounter notes, upcoming appointments, etc.  Non-urgent messages can be sent to your provider as well.   To learn more about what you can do with MyChart, go to [email or web address].    Any Other Special Instructions Will Be Listed Below (If Applicable).   Tikosyn (Dofetilide) Hospital Admission   Prior to day of admission:  Check with drug insurance company for cost of drug to ensure affordability --- Dofetilide 500 mcg twice a day.  GoodRx is an option if insurance copay is unaffordable.   All patients are  tested for COVID-19 prior to admission.   No Benadryl is allowed 3 days prior to admission.  Discontinue Propafenone 5 day prior to admission.   Please ensure no missed doses of your anticoagulation (blood thinner) for 3 weeks prior to admission. If a dose is missed please notify our office immediately.   A pharmacist will review all your medications for potential interactions with Tikosyn. If any medication changes are needed prior to admission we will be in touch with you.   If any new medications are started AFTER your admission date is set with Nurse, adult. Please notify our office immediately so your medication list can be updated and reviewed by our pharmacist again.  On day of admission:  Tikosyn initiation requires a 3 night/4 day hospital stay with constant telemetry monitoring. You will have an EKG after each dose of Tikosyn as well as daily lab draws.   If the drug does not convert you to normal rhythm a cardioversion after the 4th dose of Tikosyn.   [facility] office visit on the morning of admission is needed for preliminary labs/ekg.   Time of admission is dependent on bed availability in the hospital. In some instances, you will be sent home until bed is available. Rarely admission can be delayed to the following day if hospital census prevents available beds.   You may bring personal belongings/clothing with you to the hospital. Please leave your suitcase in the car until you arrive in admissions.   Questions please call our office at [phone]    Dofetilide capsules  What is this medication? DOFETILIDE (doe FET il ide) is an antiarrhythmic drug. It helps make your heart beat regularly. This medicine also helps to slow rapid heartbeats. This medicine may be used for other purposes; ask your health care provider or pharmacist if you have questions. COMMON BRAND NAME(S): Tikosyn What should I tell my care team before I take this medication? They need to know if you have any  of these conditions: heart disease history of irregular heartbeat history of low levels of potassium or magnesium in the blood kidney disease liver disease an unusual or allergic reaction to dofetilide, other medicines, foods, dyes, or preservatives pregnant or trying to get pregnant breast-feeding How should I use this medication? Take this medicine by mouth with a glass of water. Follow the directions on the prescription label. Do not take with grapefruit juice. You can take it with or without food. If it upsets your stomach, take it with food. Take your medicine at regular intervals. Do not take it more often than directed. Do not stop taking except on your doctor's advice. A special MedGuide will be given to you by the pharmacist with each prescription and refill. Be sure to read this information carefully each time. Talk to your pediatrician regarding the use of this medicine in children. Special care may be needed. Overdosage: If you think you have taken too much of this medicine contact a poison control center or emergency room at once. NOTE: This medicine is only for you. Do not share this medicine with others. What if I miss a dose? If you miss a dose, skip it. Take your next dose at the normal time. Do not take extra or 2 doses at the same time to make up for the missed dose. What may interact with this medication? Do not take this medicine with any of the following medications: cimetidine cisapride dolutegravir dronedarone erdafitinib hydrochlorothiazide ketoconazole megestrol pimozide prochlorperazine thioridazine trimethoprim verapamil This medicine may also interact with the following medications: amiloride cannabinoids certain antibiotics like erythromycin or clarithromycin certain antiviral medicines for HIV or hepatitis certain medicines for depression, anxiety, or psychotic disorders digoxin diltiazem grapefruit juice metformin nefazodone other medicines  that prolong the QT interval (an abnormal heart rhythm) quinine triamterene zafirlukast ziprasidone This list may not describe all possible interactions. Give your health care provider a list of all the medicines, herbs, non-prescription drugs, or dietary supplements you use. Also tell them if you smoke, drink alcohol, or use illegal drugs. Some items may interact with your medicine. What should I watch for while using this medication? Your condition will be monitored carefully while you are receiving this medicine. What side effects may I notice from receiving this medication? Side effects that you should report to your doctor or health care professional as soon as possible: allergic reactions like skin rash, itching or hives, swelling of the face, lips, or tongue breathing problems chest pain or chest tightness dizziness signs and symptoms of a dangerous change in heartbeat or heart rhythm like chest pain; dizziness; fast or irregular heartbeat; palpitations; feeling faint or lightheaded, falls; breathing problems signs and symptoms of electrolyte imbalance like severe diarrhea, unusual sweating, vomiting, loss of appetite, increased thirst swelling of the ankles, legs, or feet tingling, numbness in the hands or feet Side effects that usually do not require medical attention (report to your doctor or health care professional if they continue or are bothersome): diarrhea general ill feeling or flu-like symptoms headache nausea trouble sleeping stomach pain This list  may not describe all possible side effects. Call your doctor for medical advice about side effects. You may report side effects to FDA at 1-800-FDA-1088. Where should I keep my medication? Keep out of the reach of children. Store at room temperature between 15 and 30 degrees C (59 and 86 degrees F). Throw away any unused medicine after the expiration date. NOTE: This sheet is a summary. It may not cover all possible  information. If you have questions about this medicine, talk to your doctor, pharmacist, or health care provider.  2022 Elsevier/Gold Standard (2020-12-07 00:00:00)

## 2021-04-20 NOTE — Telephone Encounter (Signed)
Medication list reviewed in anticipation of upcoming Tikosyn initiation. Mild interactions with diltiazem and metformin (both can increase concentrations of Tikosyn) but pt ok to continue on both meds. Patient was advised at MD visit today that she would need to stop her propafenone 5 days before Tikosyn initiation. Her trazodone is also QTc prolonging. Looks like she takes low dose at night, assuming for sleep. May be worth seeing if she can try alternative sleep agent, others do not cause QTc prolongation.  Patient is anticoagulated on Eliquis 5mg  BID on the appropriate dose. Please ensure that patient has not missed any anticoagulation doses in the 3 weeks prior to Tikosyn initiation.   Patient will need to be counseled to avoid use of Benadryl while on Tikosyn and in the 2-3 days prior to Tikosyn initiation.

## 2021-04-20 NOTE — Telephone Encounter (Signed)
Pt came to the office to fill out release and pt intake forms. Forms will be given to CMA Dottie.

## 2021-04-20 NOTE — Telephone Encounter (Signed)
Patient called stating that you had left her a VM last week to give a call back about her disability paperwork. Seeking advice, please advise.

## 2021-04-20 NOTE — Telephone Encounter (Signed)
Pt stated she received a message from Mickel Baas stating we needed her signature for what she believed was her disability paperwork for her heart issue. Spoke with Colletta [place] and she stated that she called the pt to have her come by and fill out disability paperwork. Let pt know she will need to come by our office to fill out papers. Pt verbalized understanding.

## 2021-04-20 NOTE — Telephone Encounter (Signed)
Disability forms placed on Dr. Vena Rua desk to review.

## 2021-04-21 ENCOUNTER — Telehealth: Payer: Self-pay

## 2021-04-21 NOTE — Telephone Encounter (Signed)
Copied from [place] [phone]. Topic: General - Other >> Apr 21, 2021 12:10 PM Pawlus, Brayton Layman A wrote: Reason for CRM: Otila Kluver case worker with [facility] [phone] extension [phone] stated she will be faxing over some information regarding the pt, FYI.

## 2021-04-21 NOTE — Telephone Encounter (Signed)
Noted  

## 2021-04-22 ENCOUNTER — Encounter (HOSPITAL_COMMUNITY): Payer: Self-pay

## 2021-04-22 ENCOUNTER — Encounter: Payer: Self-pay | Admitting: Family Medicine

## 2021-04-22 NOTE — Telephone Encounter (Signed)
Patient instructed to stop propafenone 5 days prior to admission. Patient instructed to contact PCP regarding change from Trazodone to agent that is not QT prolonging - pt will call back with update of change.

## 2021-04-25 MED ORDER — ZOLPIDEM TARTRATE 5 MG PO TABS: 5.0000 mg | ORAL_TABLET | Freq: Every evening | ORAL | 2 refills | Status: DC | PRN

## 2021-04-26 ENCOUNTER — Telehealth (HOSPITAL_COMMUNITY): Payer: Self-pay | Admitting: *Deleted

## 2021-04-26 NOTE — Telephone Encounter (Signed)
[employer] approval of inpatient hospitalization for 2/6 auth # [id number]

## 2021-04-27 LAB — HM DIABETES EYE EXAM

## 2021-05-06 NOTE — Telephone Encounter (Signed)
The disability rep for this patient called this morning to check the status of the claim forms that were sent here to be filled out.  Please call [phone] or fax [phone]) forms.  Case # [id number]  Thank you.

## 2021-05-07 NOTE — Telephone Encounter (Signed)
Please make patient aware that I have completed this paperwork It is in my out box [name] aware that her disability is related to her cardiac conditions and not GI.  Therefore the paperwork I filled out does discuss her history of lymphocytic gastritis as well as chronic constipation but neither of these conditions at this time lead to disability or work restrictions

## 2021-05-09 ENCOUNTER — Ambulatory Visit (HOSPITAL_COMMUNITY): Payer: Managed Care, Other (non HMO) | Admitting: Physician Assistant

## 2021-05-09 ENCOUNTER — Encounter (HOSPITAL_COMMUNITY): Payer: Self-pay

## 2021-05-10 NOTE — Telephone Encounter (Signed)
I have left a voicemail for patient indicating that Dr Hilarie Fredrickson has filled out the disability forms based on GI conditions but thinks her disability is more cardiac related. I advised she may want to have cardiology fill out disability forms on their end.

## 2021-05-11 ENCOUNTER — Encounter: Payer: Self-pay | Admitting: Internal Medicine

## 2021-05-17 ENCOUNTER — Encounter: Payer: Self-pay | Admitting: Cardiovascular Disease

## 2021-05-18 ENCOUNTER — Ambulatory Visit: Payer: Managed Care, Other (non HMO) | Admitting: Dermatology

## 2021-05-23 ENCOUNTER — Encounter: Payer: Self-pay | Admitting: Family Medicine

## 2021-05-23 MED ORDER — VENLAFAXINE HCL ER 150 MG PO CP24: 150.0000 mg | ORAL_CAPSULE | Freq: Every day | ORAL | 0 refills | Status: DC

## 2021-05-23 MED ORDER — RAMIPRIL 10 MG PO CAPS: ORAL_CAPSULE | ORAL | 0 refills | Status: DC

## 2021-05-28 ENCOUNTER — Other Ambulatory Visit: Payer: Self-pay | Admitting: Family Medicine

## 2021-05-30 ENCOUNTER — Telehealth: Payer: Self-pay | Admitting: Family Medicine

## 2021-05-30 NOTE — Telephone Encounter (Signed)
Refilled at [facility] due to no insurance.

## 2021-05-30 NOTE — Telephone Encounter (Signed)
Pt picked up at [facility], states waiting on insurance and Suzie Portela is less expensive. Requested Prescriptions  Pending Prescriptions Disp Refills   ramipril (ALTACE) 10 MG capsule [Pharmacy Med Name: RAMIPRIL 10MG  CAPSULES] 90 capsule 0    Sig: TAKE 1 CAPSULE(10 MG) BY MOUTH DAILY     Cardiovascular:  ACE Inhibitors Failed - 05/28/2021  9:01 AM      Failed - Cr in normal range and within 180 days    Creatinine  Date Value Ref Range Status  10/18/2012 0.73 0.60 - 1.30 mg/dL Final   Creatinine, Ser  Date Value Ref Range Status  10/13/2020 0.60 0.44 - 1.00 mg/dL Final         Failed - K in normal range and within 180 days    Potassium  Date Value Ref Range Status  09/01/2020 4.2 3.5 - 5.2 mmol/L Final  10/18/2012 4.2 3.5 - 5.1 mmol/L Final         Passed - Patient is not pregnant      Passed - Last BP in normal range    BP Readings from Last 1 Encounters:  04/20/21 110/74         Passed - Valid encounter within last 6 months    Recent Outpatient Visits          4 months ago Encounter for annual health examination   [facility] [place], Dionne Bucy, MD   6 months ago Moderate episode of recurrent major depressive disorder [facility])   [facility], Dionne Bucy, MD   10 months ago Chronic constipation   [facility] [place], Dionne Bucy, MD   1 year ago Acute right-sided low back pain without sciatica   [place], Kelby Aline, FNP   1 year ago Chronic constipation   [facility] [place], Dionne Bucy, MD      Future Appointments            In 1 month Bacigalupo, Dionne Bucy, MD [facility], [place]

## 2021-05-30 NOTE — Telephone Encounter (Signed)
[facility] faxed refill request for the following medications:  venlafaxine XR (EFFEXOR-XR) 150 MG 24 hr capsule   Please advise

## 2021-06-01 ENCOUNTER — Encounter: Payer: Self-pay | Admitting: Family Medicine

## 2021-06-02 MED ORDER — ZOLPIDEM TARTRATE 5 MG PO TABS: 5.0000 mg | ORAL_TABLET | Freq: Every evening | ORAL | 2 refills | Status: DC | PRN

## 2021-06-20 ENCOUNTER — Encounter: Payer: Self-pay | Admitting: Family Medicine

## 2021-06-20 ENCOUNTER — Telehealth: Payer: Self-pay | Admitting: Family Medicine

## 2021-06-20 MED ORDER — METFORMIN HCL 1000 MG PO TABS: 1000.0000 mg | ORAL_TABLET | Freq: Every day | ORAL | 0 refills | Status: DC

## 2021-06-20 NOTE — Addendum Note (Signed)
Addended by: Shawna Orleans on: 06/20/2021 01:39 PM ? ? Modules accepted: Orders ? ?

## 2021-06-20 NOTE — Telephone Encounter (Signed)
[facility] faxed refill request for the following medications:   metFORMIN (GLUCOPHAGE) 1000 MG tablet     Please advise.  

## 2021-06-30 ENCOUNTER — Encounter: Payer: Self-pay | Admitting: Family Medicine

## 2021-06-30 MED ORDER — METFORMIN HCL 1000 MG PO TABS: 1000.0000 mg | ORAL_TABLET | Freq: Every day | ORAL | 0 refills | Status: DC

## 2021-07-13 NOTE — Progress Notes (Signed)
?  ? ?I,Sulibeya S Dimas,acting as a scribe for Lavon Paganini, MD.,have documented all relevant documentation on the behalf of Lavon Paganini, MD,as directed by  Lavon Paganini, MD while in the presence of Lavon Paganini, MD. ? ? ?Established patient visit ? ? ?Patient: Julia Stark   DOB: 08-Dec-1960   61 y.o. Female  MRN: [id number] ?Visit Date: 07/14/2021 ? ?Today's healthcare provider: Lavon Paganini, MD  ? ?Chief Complaint  ?Patient presents with  ? Diabetes  ? ?Subjective  ?  ?HPI  ?Diabetes Mellitus Type II, follow-up ? ?Lab Results  ?Component Value Date  ? HGBA1C 6.2 (A) 07/14/2021  ? HGBA1C 6.5 (H) 11/10/2020  ? HGBA1C 6.1 12/29/2019  ? ?Last seen for diabetes 6 months ago.  ?Management since then includes continuing the same treatment. ?She reports excellent compliance with treatment. ?[name] not having side effects.  ? ?Episodes of hypoglycemia? No  ?  ?Current insulin regiment: none ?Most Recent Eye Exam: 04/2021 ? ?--------------------------------------------------------------------------------------------------- ?Hypertension, follow-up ? ?BP Readings from Last 3 Encounters:  ?07/14/21 106/66  ?04/20/21 110/74  ?03/02/21 124/60  ? Wt Readings from Last 3 Encounters:  ?07/14/21 194 lb 12.8 oz (88.4 kg)  ?04/20/21 198 lb (89.8 kg)  ?03/02/21 200 lb (90.7 kg)  ?  ? ?She was last seen for hypertension 6 months ago.  ?BP at that visit was 120/80. ?Management since that visit includes no changes. ?She reports excellent compliance with treatment. ?[name] not having side effects.  ?[name] not exercising. ?[name] adherent to low salt diet.   ?Outside blood pressures are not checked. ? ?She does not smoke. ? ?Use of agents associated with hypertension: none.  ? ?--------------------------------------------------------------------------------------------------- ?Lipid/Cholesterol, follow-up ? ?Last Lipid Panel: ?Lab Results  ?Component Value Date  ? CHOL 161 11/10/2020  ? [place] 95 11/10/2020  ?  HDL 35 (L) 11/10/2020  ? TRIG 176 (H) 11/10/2020  ? ? ?She was last seen for this 6 months ago.  ?Management since that visit includes no changes. ? ?She reports poor compliance with treatment. ?[name] not having side effects.  ? ? ?Last metabolic panel ?Lab Results  ?Component Value Date  ? GLUCOSE 106 (H) 09/01/2020  ? NA 136 09/01/2020  ? K 4.2 09/01/2020  ? BUN 9 09/01/2020  ? CREATININE 0.60 10/13/2020  ? EGFR 99 09/01/2020  ? GFRNONAA 89 05/14/2020  ? CALCIUM 9.3 09/01/2020  ? AST 28 09/01/2020  ? ALT 17 09/01/2020  ? ?The 10-year ASCVD risk score (Arnett DK, et al., 2019) is: 6.7% ? ?--------------------------------------------------------------------------------------------------- ? ? ?Medications: ?Outpatient Medications Prior to Visit  ?Medication Sig  ? apixaban (ELIQUIS) 5 MG TABS tablet Take 5 mg by mouth 2 (two) times daily.  ? diltiazem (DILACOR XR) 120 MG 24 hr capsule Take 1 capsule (120 mg total) by mouth 2 (two) times daily.  ? linaclotide (LINZESS) 145 MCG CAPS capsule Take 1 capsule (145 mcg total) by mouth daily before breakfast.  ? metFORMIN (GLUCOPHAGE) 1000 MG tablet Take 1 tablet (1,000 mg total) by mouth daily. TAKE 1 TABLET(1000 MG) BY MOUTH DAILY  ? omeprazole (PRILOSEC OTC) 20 MG tablet Take 20 mg by mouth in the morning and at bedtime.  ? propafenone (RYTHMOL SR) 225 MG 12 hr capsule Take 225 mg by mouth 2 (two) times daily.  ? ramipril (ALTACE) 10 MG capsule TAKE 1 CAPSULE(10 MG) BY MOUTH DAILY  ? valACYclovir (VALTREX) 500 MG tablet TAKE 1 TABLET(500 MG) BY MOUTH DAILY  ? venlafaxine XR (EFFEXOR-XR)  150 MG 24 hr capsule Take 1 capsule (150 mg total) by mouth daily with breakfast.  ? zolpidem (AMBIEN) 5 MG tablet Take 1 tablet (5 mg total) by mouth at bedtime as needed for sleep.  ? [DISCONTINUED] rosuvastatin (CRESTOR) 5 MG tablet TAKE 1 TABLET(5 MG) BY MOUTH DAILY  ? ?No facility-administered medications prior to visit.  ? ? ?Review of Systems per HPI ? ? ?  Objective  ?  ?BP 106/66  (BP Location: Left Arm, Patient Position: Sitting, Cuff Size: Normal)   Pulse 91   Temp 98.5 ?F (36.9 ?C) (Oral)   Resp 16   Wt 194 lb 12.8 oz (88.4 kg)   LMP 04/13/2011   SpO2 97%   BMI 29.62 kg/m?  ? ? ?Physical Exam ?Vitals reviewed.  ?Constitutional:   ?   General: [name] not in acute distress. ?   Appearance: Normal appearance. [name] well-developed. [name] not diaphoretic.  ?HENT:  ?   Head: Normocephalic and atraumatic.  ?Eyes:  ?   General: No scleral icterus. ?   Conjunctiva/sclera: Conjunctivae normal.  ?Neck:  ?   Thyroid: No thyromegaly.  ?Cardiovascular:  ?   Rate and Rhythm: Normal rate. Rhythm irregularly irregular.  ?   Heart sounds: Normal heart sounds. No murmur heard. ?Pulmonary:  ?   Effort: Pulmonary effort is normal. No respiratory distress.  ?   Breath sounds: Normal breath sounds. No wheezing, rhonchi or rales.  ?Musculoskeletal:  ?   Cervical back: Neck supple.  ?   Right lower leg: No edema.  ?   Left lower leg: No edema.  ?Lymphadenopathy:  ?   Cervical: No cervical adenopathy.  ?Skin: ?   General: Skin is warm and dry.  ?   Findings: No rash.  ?Neurological:  ?   Mental Status: [name] alert and oriented to person, place, and time. Mental status is at baseline.  ?Psychiatric:     ?   Mood and Affect: Mood normal.     ?   Behavior: Behavior normal.  ?  ? ? ?Results for orders placed or performed in visit on 07/14/21  ?POCT glycosylated hemoglobin (Hb A1C)  ?Result Value Ref Range  ? Hemoglobin A1C 6.2 (A) 4.0 - 5.6 %  ? Est. average glucose Bld gHb Est-mCnc 131   ? ? Assessment & Plan  ?  ? ?Problem List Items Addressed This Visit   ? ?  ? Cardiovascular and Mediastinum  ? Hypertension associated with diabetes ([place])  ?  Well controlled ?Continue current medications ?Recheck metabolic panel ?F/u in 6 months  ?  ?  ? Relevant Orders  ? Comprehensive metabolic panel  ?  ? Endocrine  ? T2DM (type 2 diabetes mellitus) ([place]) - Primary  ?  Well controlled ?Continue current medications ?UTD  on vaccines, eye exam (ROI sent), foot exam ?On ACEi/ARB ?Not on Statin - does not want to check ?Discussed diet and exercise ?  ?  ? Relevant Orders  ? POCT glycosylated hemoglobin (Hb A1C) (Completed)  ? Urine Microalbumin w/creat. ratio  ? Hyperlipidemia associated with type 2 diabetes mellitus ([place])  ?  Reviewed last lipid panel ?Not currently on a statin - did not worsen myalgias, but not taken ?Recheck FLP and CMP ?Discussed diet and exercise  ?  ?  ? Relevant Orders  ? Comprehensive metabolic panel  ? Lipid panel  ?  ? Hematopoietic and Hemostatic  ? Acquired thrombophilia ([place])  ?  2/2 a fib ?  Continue DOAC ?  ?  ? Relevant Orders  ? CBC  ?  ? Other  ? Chronic anticoagulation  ?  Recheck CBC ?  ?  ? Relevant Orders  ? CBC  ? Primary insomnia  ?  Chronic and stable ?Continue ambien ?No longer on trazodone due to QT prolongation ?  ?  ? Overweight  ?  Congratulated on weight loss ?Discussed importance of healthy weight management ?Discussed diet and exercise  ?  ?  ? Moderate episode of recurrent major depressive disorder ([place])  ?  Chronic and fairly well controlled ?Is under a lot of stress ?Continue effexor at current dose ?  ?  ? ?Other Visit Diagnoses   ? ? Screening mammogram for breast cancer      ? Relevant Orders  ? MM 3D SCREEN BREAST BILATERAL  ? Need for shingles vaccine      ? Relevant Orders  ? Varicella-zoster vaccine IM  ? ?  ?  ? ?Return in about 6 months (around 01/13/2022) for CPE.  ?   ? ?I, Lavon Paganini, MD, have reviewed all documentation for this visit. The documentation on 07/14/21 for the exam, diagnosis, procedures, and orders are all accurate and complete. ? ? ?Virginia Crews, MD, MPH ?[place] ?[facility]   ?

## 2021-07-14 ENCOUNTER — Ambulatory Visit (INDEPENDENT_AMBULATORY_CARE_PROVIDER_SITE_OTHER): Payer: 59 | Admitting: Family Medicine

## 2021-07-14 ENCOUNTER — Encounter: Payer: Self-pay | Admitting: Family Medicine

## 2021-07-14 VITALS — BP 106/66 | HR 91 | Temp 98.5°F | Resp 16 | Wt 194.8 lb

## 2021-07-14 DIAGNOSIS — E785 Hyperlipidemia, unspecified: Secondary | ICD-10-CM

## 2021-07-14 DIAGNOSIS — R69 Illness, unspecified: Secondary | ICD-10-CM | POA: Diagnosis not present

## 2021-07-14 DIAGNOSIS — Z1231 Encounter for screening mammogram for malignant neoplasm of breast: Secondary | ICD-10-CM | POA: Diagnosis not present

## 2021-07-14 DIAGNOSIS — E663 Overweight: Secondary | ICD-10-CM | POA: Diagnosis not present

## 2021-07-14 DIAGNOSIS — E1159 Type 2 diabetes mellitus with other circulatory complications: Secondary | ICD-10-CM

## 2021-07-14 DIAGNOSIS — F331 Major depressive disorder, recurrent, moderate: Secondary | ICD-10-CM | POA: Diagnosis not present

## 2021-07-14 DIAGNOSIS — Z23 Encounter for immunization: Secondary | ICD-10-CM

## 2021-07-14 DIAGNOSIS — E1169 Type 2 diabetes mellitus with other specified complication: Secondary | ICD-10-CM | POA: Diagnosis not present

## 2021-07-14 DIAGNOSIS — F5101 Primary insomnia: Secondary | ICD-10-CM

## 2021-07-14 DIAGNOSIS — Z7901 Long term (current) use of anticoagulants: Secondary | ICD-10-CM

## 2021-07-14 DIAGNOSIS — D6869 Other thrombophilia: Secondary | ICD-10-CM | POA: Diagnosis not present

## 2021-07-14 DIAGNOSIS — I152 Hypertension secondary to endocrine disorders: Secondary | ICD-10-CM

## 2021-07-14 LAB — POCT GLYCOSYLATED HEMOGLOBIN (HGB A1C)
Est. average glucose Bld gHb Est-mCnc: 131
Hemoglobin A1C: 6.2 % — AB (ref 4.0–5.6)

## 2021-07-14 NOTE — Assessment & Plan Note (Addendum)
Chronic and fairly well controlled ?Is under a lot of stress ?Continue effexor at current dose ?

## 2021-07-14 NOTE — Assessment & Plan Note (Signed)
Chronic and stable ?Continue ambien ?No longer on trazodone due to QT prolongation ?

## 2021-07-14 NOTE — Assessment & Plan Note (Signed)
Recheck CBC. 

## 2021-07-14 NOTE — Assessment & Plan Note (Signed)
Reviewed last lipid panel ?Not currently on a statin - did not worsen myalgias, but not taken ?Recheck FLP and CMP ?Discussed diet and exercise  ?

## 2021-07-14 NOTE — Assessment & Plan Note (Signed)
2/2 a fib ?Continue DOAC ?

## 2021-07-14 NOTE — Assessment & Plan Note (Signed)
Well controlled Continue current medications Recheck metabolic panel F/u in 6 months  

## 2021-07-14 NOTE — Assessment & Plan Note (Addendum)
Well controlled ?Continue current medications ?UTD on vaccines, eye exam (ROI sent), foot exam ?On ACEi/ARB ?Not on Statin - does not want to check ?Discussed diet and exercise ?

## 2021-07-14 NOTE — Assessment & Plan Note (Signed)
Congratulated on weight loss ?Discussed importance of healthy weight management ?Discussed diet and exercise  ?

## 2021-07-14 NOTE — Patient Instructions (Signed)
[facility] in [place] ? ?[email or web address] - like the yellow pages for therapists ?

## 2021-07-15 DIAGNOSIS — E1159 Type 2 diabetes mellitus with other circulatory complications: Secondary | ICD-10-CM | POA: Diagnosis not present

## 2021-07-15 DIAGNOSIS — E785 Hyperlipidemia, unspecified: Secondary | ICD-10-CM | POA: Diagnosis not present

## 2021-07-15 DIAGNOSIS — E1169 Type 2 diabetes mellitus with other specified complication: Secondary | ICD-10-CM | POA: Diagnosis not present

## 2021-07-15 DIAGNOSIS — D6869 Other thrombophilia: Secondary | ICD-10-CM | POA: Diagnosis not present

## 2021-07-15 DIAGNOSIS — I152 Hypertension secondary to endocrine disorders: Secondary | ICD-10-CM | POA: Diagnosis not present

## 2021-07-15 DIAGNOSIS — Z7901 Long term (current) use of anticoagulants: Secondary | ICD-10-CM | POA: Diagnosis not present

## 2021-07-17 ENCOUNTER — Encounter: Payer: Self-pay | Admitting: Family Medicine

## 2021-07-17 LAB — CBC
Hematocrit: 39.6 % (ref 34.0–46.6)
Hemoglobin: 13.8 g/dL (ref 11.1–15.9)
MCH: 30.9 pg (ref 26.6–33.0)
MCHC: 34.8 g/dL (ref 31.5–35.7)
MCV: 89 fL (ref 79–97)
Platelets: 144 10*3/uL — ABNORMAL LOW (ref 150–450)
RBC: 4.46 x10E6/uL (ref 3.77–5.28)
RDW: 12.8 % (ref 11.7–15.4)
WBC: 5.7 10*3/uL (ref 3.4–10.8)

## 2021-07-17 LAB — COMPREHENSIVE METABOLIC PANEL
ALT: 31 IU/L (ref 0–32)
AST: 42 IU/L — ABNORMAL HIGH (ref 0–40)
Albumin/Globulin Ratio: 1.5 (ref 1.2–2.2)
Albumin: 4.5 g/dL (ref 3.8–4.9)
Alkaline Phosphatase: 89 IU/L (ref 44–121)
BUN/Creatinine Ratio: 15 (ref 12–28)
BUN: 9 mg/dL (ref 8–27)
Bilirubin Total: 0.8 mg/dL (ref 0.0–1.2)
CO2: 23 mmol/L (ref 20–29)
Calcium: 9.6 mg/dL (ref 8.7–10.3)
Chloride: 99 mmol/L (ref 96–106)
Creatinine, Ser: 0.61 mg/dL (ref 0.57–1.00)
Globulin, Total: 3.1 g/dL (ref 1.5–4.5)
Glucose: 127 mg/dL — ABNORMAL HIGH (ref 70–99)
Potassium: 4.2 mmol/L (ref 3.5–5.2)
Sodium: 138 mmol/L (ref 134–144)
Total Protein: 7.6 g/dL (ref 6.0–8.5)
eGFR: 102 mL/min/{1.73_m2} (ref >59)

## 2021-07-17 LAB — LIPID PANEL
Chol/HDL Ratio: 5.5 ratio — ABNORMAL HIGH (ref 0.0–4.4)
Cholesterol, Total: 240 mg/dL — ABNORMAL HIGH (ref 100–199)
HDL: 44 mg/dL (ref >39)
LDL Chol Calc (NIH): 162 mg/dL — ABNORMAL HIGH (ref 0–99)
Triglycerides: 184 mg/dL — ABNORMAL HIGH (ref 0–149)
VLDL Cholesterol Cal: 34 mg/dL (ref 5–40)

## 2021-07-17 LAB — MICROALBUMIN / CREATININE URINE RATIO
Creatinine, Urine: 125.3 mg/dL
Microalb/Creat Ratio: 35 mg/g creat — ABNORMAL HIGH (ref 0–29)
Microalbumin, Urine: 43.5 ug/mL

## 2021-07-18 DIAGNOSIS — I480 Paroxysmal atrial fibrillation: Secondary | ICD-10-CM | POA: Diagnosis not present

## 2021-07-18 DIAGNOSIS — I10 Essential (primary) hypertension: Secondary | ICD-10-CM | POA: Diagnosis not present

## 2021-07-18 DIAGNOSIS — E782 Mixed hyperlipidemia: Secondary | ICD-10-CM | POA: Diagnosis not present

## 2021-07-18 IMAGING — MG MM DIGITAL SCREENING BILAT W/ TOMO AND CAD
8 series · 8 of 24 positions shown · non-contrast
Comparison: Previous exam(s).

CLINICAL DATA: Screening.

EXAM:
DIGITAL SCREENING BILATERAL MAMMOGRAM WITH TOMOSYNTHESIS AND CAD
TECHNIQUE: Bilateral screening digital craniocaudal and mediolateral oblique
mammograms were obtained. Bilateral screening digital breast
tomosynthesis was performed. The images were evaluated with
computer-aided detection.

[R MLO synth-2D]
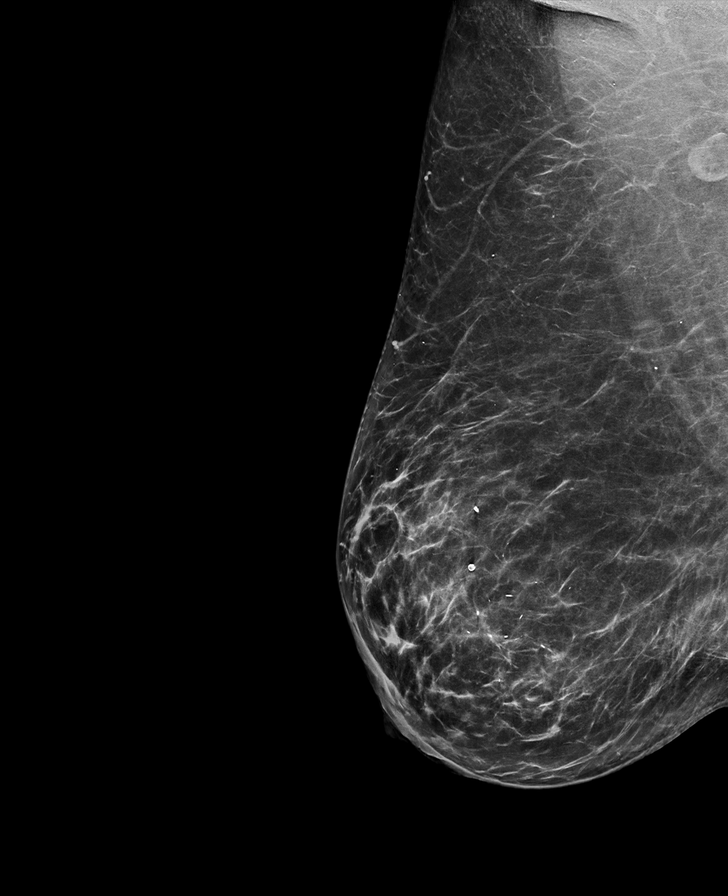

[L CC synth-2D]
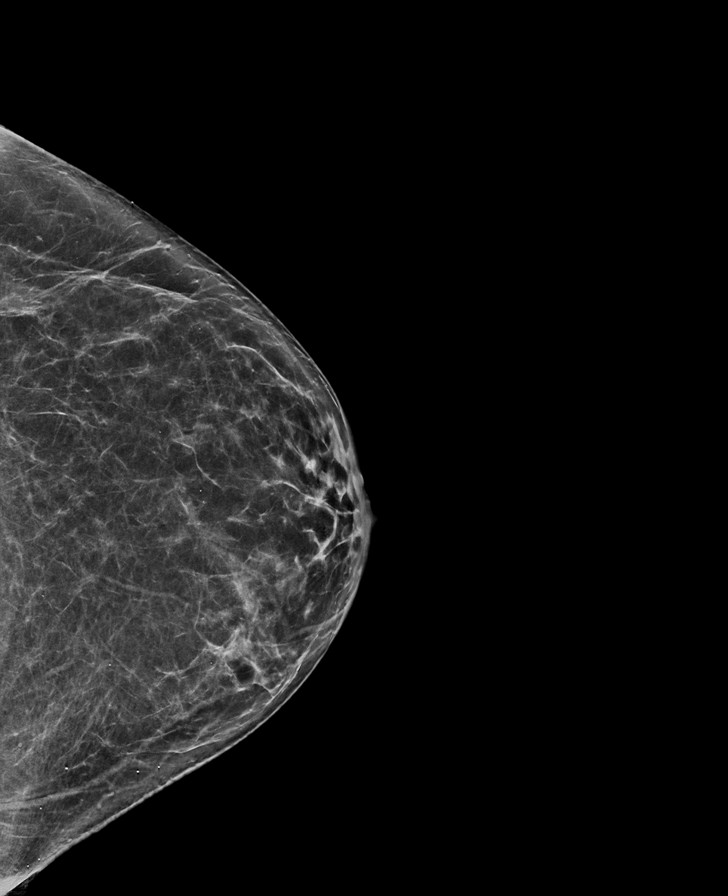

[R CC synth-2D]
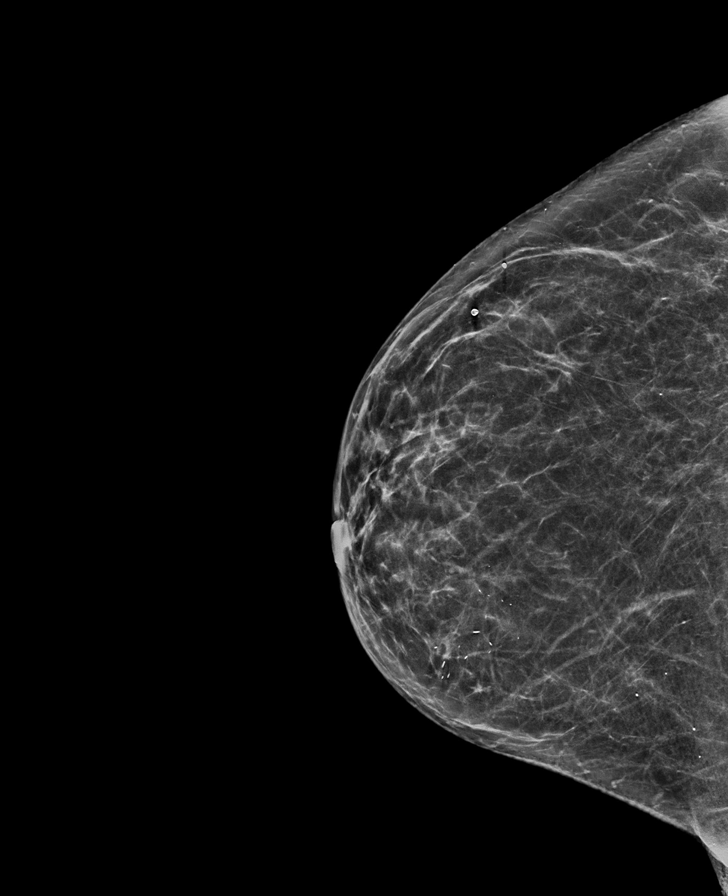

[L MLO synth-2D]
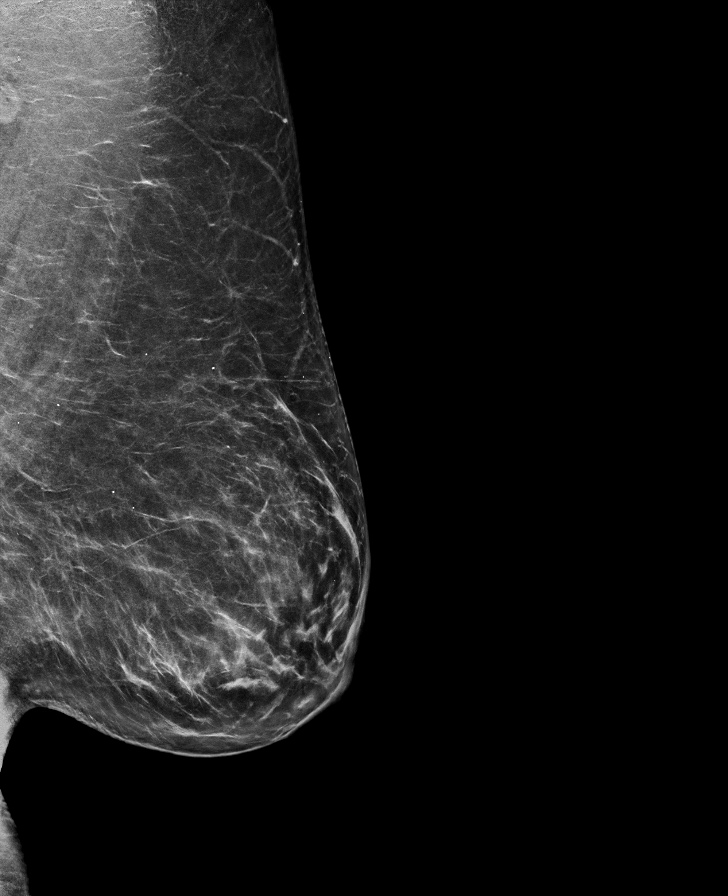

[R CC tomo · tomo slice 37/74.0]
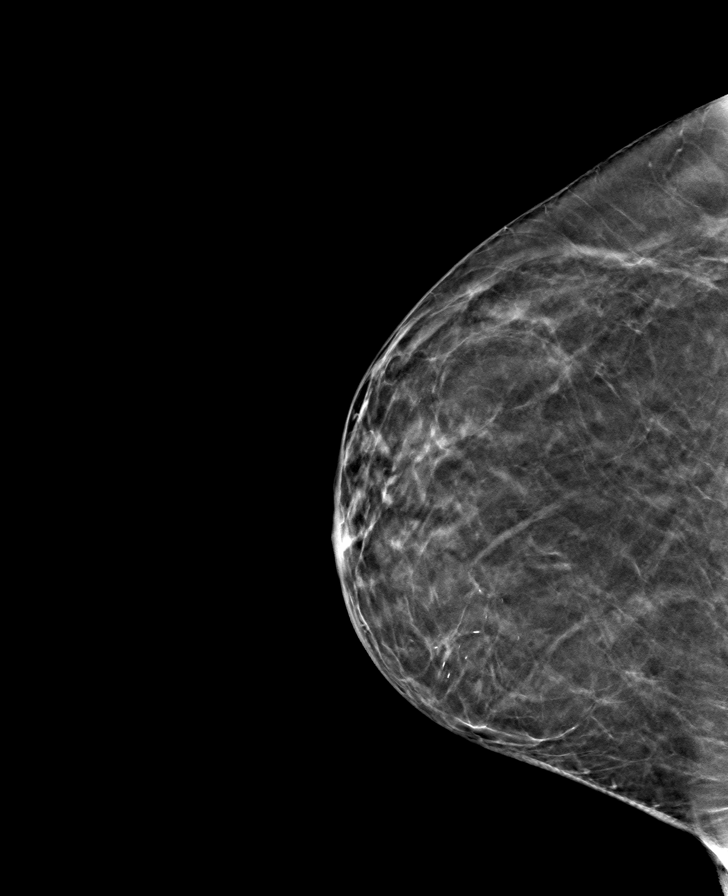

[R MLO tomo · tomo slice 43/85.0]
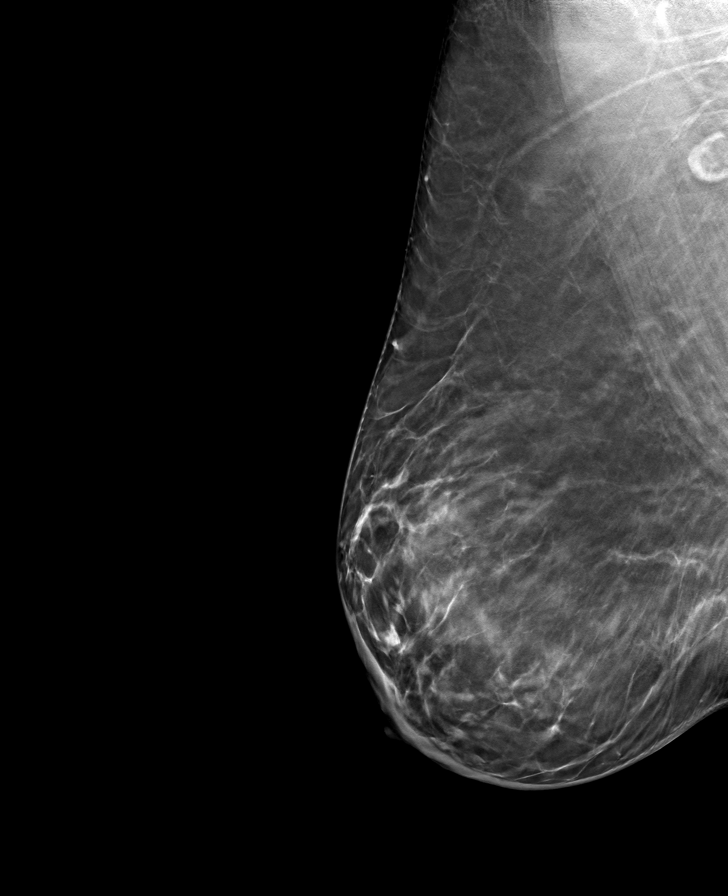

[L MLO tomo · tomo slice 43/84.0]
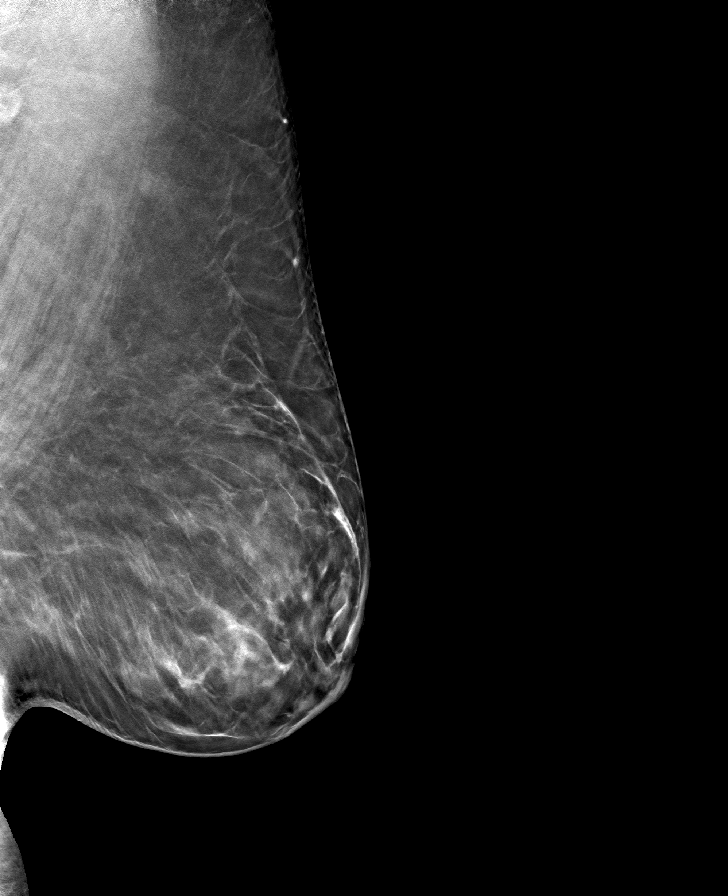

[L CC tomo · tomo slice 37/73.0]
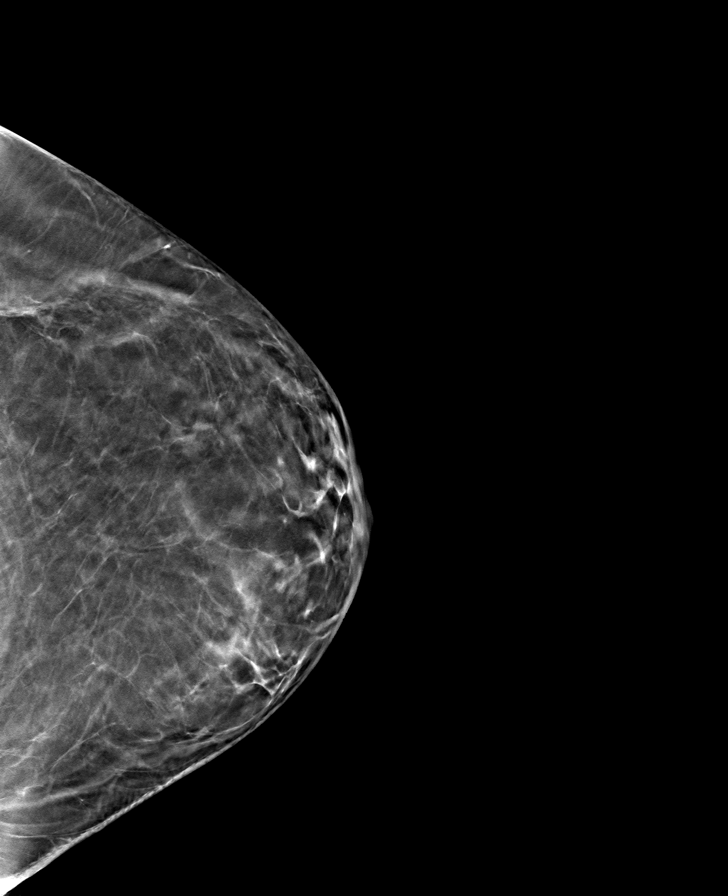

[8 of 24 positions shown; findings below may reference images not displayed]

ACR Breast Density Category b: There are scattered areas of
fibroglandular density.
FINDINGS: There are no findings suspicious for malignancy. The images were
evaluated with computer-aided detection.
IMPRESSION: No mammographic evidence of malignancy. A result letter of this
screening mammogram will be mailed directly to the patient.

RECOMMENDATION:
Screening mammogram in one year. (Code:WJ-[id number])

BI-RADS CATEGORY  1: Negative.

## 2021-07-27 ENCOUNTER — Encounter: Payer: Self-pay | Admitting: Family Medicine

## 2021-08-02 ENCOUNTER — Ambulatory Visit (INDEPENDENT_AMBULATORY_CARE_PROVIDER_SITE_OTHER): Payer: 59 | Admitting: Family Medicine

## 2021-08-02 ENCOUNTER — Encounter: Payer: Self-pay | Admitting: Family Medicine

## 2021-08-02 ENCOUNTER — Ambulatory Visit: Payer: Self-pay

## 2021-08-02 VITALS — BP 110/68 | HR 81 | Resp 16 | Wt 197.0 lb

## 2021-08-02 DIAGNOSIS — R109 Unspecified abdominal pain: Secondary | ICD-10-CM | POA: Diagnosis not present

## 2021-08-02 DIAGNOSIS — N200 Calculus of kidney: Secondary | ICD-10-CM | POA: Diagnosis not present

## 2021-08-02 DIAGNOSIS — R319 Hematuria, unspecified: Secondary | ICD-10-CM | POA: Diagnosis not present

## 2021-08-02 LAB — POCT URINALYSIS DIPSTICK
Bilirubin, UA: NEGATIVE
Blood, UA: POSITIVE
Glucose, UA: NEGATIVE
Ketones, UA: NEGATIVE
Nitrite, UA: NEGATIVE
Protein, UA: POSITIVE — AB
Spec Grav, UA: >=1.03 — AB (ref 1.010–1.025)
Urobilinogen, UA: 0.2 E.U./dL
pH, UA: 6 (ref 5.0–8.0)

## 2021-08-02 NOTE — Assessment & Plan Note (Addendum)
H/o renal stones with current flank pain and hematuria, likely exacerbated by eliquis. UA suspicious for stone, no obvious stone or hydronephrosis on bedside US. Will obtain CT renal study. Not in much pain now, ok for NSAIDs for pain relief. F/u for new or worsened symptoms.  ?

## 2021-08-02 NOTE — Progress Notes (Signed)
? ?  SUBJECTIVE:  ? ?CHIEF COMPLAINT / HPI:  ? ?URINARY SYMPTOMS ?- endorsing R flank pain for the last week with blood in urine,  ?- h/o kidney stone with similar symptoms. Last imaging ~?1 year ago with stone visualized in kidney, thinks about 66m.  ?- denies dysuria, groin pain, fever, nausea, vomiting, vaginal discharge ?- hasn't had to take anything for pain.  ?- on eliquis for afib ? ? ?OBJECTIVE:  ? ?BP 110/68 (BP Location: Left Arm, Patient Position: Sitting, Cuff Size: Large)   Pulse 81   Resp 16   Wt 197 lb (89.4 kg)   LMP 04/13/2011   SpO2 97%   BMI 29.95 kg/m?   ?Gen: well appearing, in NAD ?Card: Reg rate ?Lungs: Comfortable WOB on RA ?Abd: NTND, soft.  ?Ext: WWP, no edema ? ?Limited [place] kidney: ?Findings: Normal cortical contour. No hydronephrosis ot obvious stone visualized. Bladder decompressed.  ?Impression: No hydronephrosis or obvious stone.  ? ? ?ASSESSMENT/PLAN:  ? ?Renal stones ?H/o renal stones with current flank pain and hematuria, likely exacerbated by eliquis. UA suspicious for stone, no obvious stone or hydronephrosis on bedside [place] Will obtain CT renal study. Not in much pain now, ok for NSAIDs for pain relief. F/u for new or worsened symptoms.  ? ? ? ? ?AMyles Gip DO ?

## 2021-08-02 NOTE — Patient Instructions (Signed)
It was great to see you! ? ?Our plans for today:  ?- We are getting a CT scan to evaluate for kidney stone.  ?- Take ibuprofen as needed for pain. ? ?Take care and seek immediate care sooner if you develop any concerns.  ? ?Dr. Ky Barban ? ?

## 2021-08-02 NOTE — Telephone Encounter (Signed)
? ? ?  Chief Complaint: Blood in urine, history of kidney stones. ?Symptoms: Back pain ?Frequency: Started last week. ?Pertinent Negatives: Patient denies pain with urination. ?Disposition: '[]'$ ED /'[]'$ Urgent Care (no appt availability in office) / '[x]'$ Appointment(In office/virtual)/ '[]'$  [place] [facility]/ '[]'$ Home Care/ '[]'$ Refused Recommended Disposition /'[]'$ [place] [facility]/ '[]'$  Follow-up with PCP ?Additional Notes: Appointment made.  ?Reason for Disposition ? Side (flank) or back pain present ? ?Answer Assessment - Initial Assessment Questions ?1. COLOR of URINE: "Describe the color of the urine."  (e.g., tea-colored, pink, red, blood clots, bloody) ?    Red ?2. ONSET: "When did the bleeding start?"  ?    Last week ?3. EPISODES: "How many times has there been blood in the urine?" or "How many times today?" ?    Many ?4. PAIN with URINATION: "Is there any pain with passing your urine?" If Yes, ask: "How bad is the pain?"  (Scale 1-10; or mild, moderate, severe) ?   - MILD - complains slightly about urination hurting ?   - MODERATE - interferes with normal activities   ?   - SEVERE - excruciating, unwilling or unable to urinate because of the pain  ?    No ?5. FEVER: "Do you have a fever?" If Yes, ask: "What is your temperature, how was it measured, and when did it start?" ?    No ?6. ASSOCIATED SYMPTOMS: "Are you passing urine more frequently than usual?" ?    No ?7. OTHER SYMPTOMS: "Do you have any other symptoms?" (e.g., back/flank pain, abdominal pain, vomiting) ?    Back pain - mild ?8. PREGNANCY: "Is there any chance you are pregnant?" "When was your last menstrual period?" ?    No ? ?Protocols used: Urine - Blood In-A-AH ? ?

## 2021-08-03 ENCOUNTER — Encounter: Payer: Self-pay | Admitting: Family Medicine

## 2021-08-03 LAB — URINALYSIS, ROUTINE W REFLEX MICROSCOPIC
Bilirubin, UA: NEGATIVE
Glucose, UA: NEGATIVE
Ketones, UA: NEGATIVE
Nitrite, UA: NEGATIVE
Specific Gravity, UA: 1.018 (ref 1.005–1.030)
Urobilinogen, Ur: 1 mg/dL (ref 0.2–1.0)
pH, UA: 5.5 (ref 5.0–7.5)

## 2021-08-03 LAB — MICROSCOPIC EXAMINATION
Bacteria, UA: NONE SEEN
Casts: NONE SEEN /lpf
RBC, Urine: >30 /hpf — AB (ref 0–2)

## 2021-08-04 ENCOUNTER — Encounter: Payer: Self-pay | Admitting: Family Medicine

## 2021-08-05 LAB — CULTURE, URINE COMPREHENSIVE

## 2021-08-11 ENCOUNTER — Other Ambulatory Visit: Payer: Self-pay | Admitting: Family Medicine

## 2021-08-11 ENCOUNTER — Other Ambulatory Visit: Payer: Self-pay

## 2021-08-11 ENCOUNTER — Ambulatory Visit
Admission: RE | Admit: 2021-08-11 | Discharge: 2021-08-11 | Disposition: A | Discharge status: Home / Self Care | Payer: 59 | Source: Ambulatory Visit | Attending: Family Medicine | Admitting: Family Medicine

## 2021-08-11 DIAGNOSIS — N200 Calculus of kidney: Secondary | ICD-10-CM | POA: Diagnosis not present

## 2021-08-11 DIAGNOSIS — Z9049 Acquired absence of other specified parts of digestive tract: Secondary | ICD-10-CM | POA: Diagnosis not present

## 2021-08-11 DIAGNOSIS — R319 Hematuria, unspecified: Secondary | ICD-10-CM | POA: Diagnosis not present

## 2021-08-11 DIAGNOSIS — R109 Unspecified abdominal pain: Secondary | ICD-10-CM | POA: Diagnosis not present

## 2021-08-11 DIAGNOSIS — I700 Atherosclerosis of aorta: Secondary | ICD-10-CM | POA: Diagnosis not present

## 2021-08-25 ENCOUNTER — Other Ambulatory Visit: Payer: Self-pay | Admitting: Family Medicine

## 2021-09-02 ENCOUNTER — Other Ambulatory Visit: Payer: Self-pay | Admitting: Family Medicine

## 2021-09-05 ENCOUNTER — Encounter: Payer: Self-pay | Admitting: Family Medicine

## 2021-09-05 NOTE — Telephone Encounter (Signed)
Requested medication (s) are due for refill today:   Provider to review  Requested medication (s) are on the active medication list:   Yes  Future visit scheduled:   Yes   Last ordered: 06/02/2021 #30, 2 refills  Returned because it's a non delegated refill   Requested Prescriptions  Pending Prescriptions Disp Refills   zolpidem (AMBIEN) 5 MG tablet [Pharmacy Med Name: Zolpidem Tartrate 5 MG Oral Tablet] 30 tablet 0    Sig: TAKE 1 TABLET BY MOUTH AT BEDTIME AS NEEDED FOR SLEEP     Not Delegated - Psychiatry:  Anxiolytics/Hypnotics Failed - 09/02/2021  5:17 PM      Failed - This refill cannot be delegated      Failed - Urine Drug Screen completed in last 360 days      Passed - Valid encounter within last 6 months    Recent Outpatient Visits           1 month ago Hematuria, unspecified type   [facility] [place], Jake Church, DO   1 month ago Type 2 diabetes mellitus with other specified complication, without long-term current use of insulin [facility])   [facility] [place], Dionne Bucy, MD   7 months ago Encounter for annual health examination   [facility] [place], Dionne Bucy, MD   9 months ago Moderate episode of recurrent major depressive disorder [facility])   [facility] [place], Dionne Bucy, MD   1 year ago Chronic constipation   [facility] [place], Dionne Bucy, MD       Future Appointments             In 3 months Brendolyn Patty, MD [place]   In 4 months Bacigalupo, Dionne Bucy, MD [facility], [place]

## 2021-09-13 DIAGNOSIS — R300 Dysuria: Secondary | ICD-10-CM | POA: Diagnosis not present

## 2021-09-18 ENCOUNTER — Other Ambulatory Visit: Payer: Self-pay | Admitting: Family Medicine

## 2021-09-26 ENCOUNTER — Encounter: Payer: Self-pay | Admitting: Family Medicine

## 2021-10-10 DIAGNOSIS — Z9889 Other specified postprocedural states: Secondary | ICD-10-CM | POA: Diagnosis not present

## 2021-10-10 DIAGNOSIS — I10 Essential (primary) hypertension: Secondary | ICD-10-CM | POA: Diagnosis not present

## 2021-10-10 DIAGNOSIS — Z7901 Long term (current) use of anticoagulants: Secondary | ICD-10-CM | POA: Diagnosis not present

## 2021-10-10 DIAGNOSIS — E785 Hyperlipidemia, unspecified: Secondary | ICD-10-CM | POA: Diagnosis not present

## 2021-10-10 DIAGNOSIS — I480 Paroxysmal atrial fibrillation: Secondary | ICD-10-CM | POA: Diagnosis not present

## 2021-10-10 DIAGNOSIS — I440 Atrioventricular block, first degree: Secondary | ICD-10-CM | POA: Diagnosis not present

## 2021-10-10 DIAGNOSIS — I441 Atrioventricular block, second degree: Secondary | ICD-10-CM | POA: Diagnosis not present

## 2021-10-10 DIAGNOSIS — I483 Typical atrial flutter: Secondary | ICD-10-CM | POA: Diagnosis not present

## 2021-10-10 DIAGNOSIS — Z8679 Personal history of other diseases of the circulatory system: Secondary | ICD-10-CM | POA: Diagnosis not present

## 2021-10-10 DIAGNOSIS — R9431 Abnormal electrocardiogram [ECG] [EKG]: Secondary | ICD-10-CM | POA: Diagnosis not present

## 2021-10-10 DIAGNOSIS — E119 Type 2 diabetes mellitus without complications: Secondary | ICD-10-CM | POA: Diagnosis not present

## 2021-10-25 DIAGNOSIS — I480 Paroxysmal atrial fibrillation: Secondary | ICD-10-CM | POA: Diagnosis not present

## 2021-10-25 DIAGNOSIS — R69 Illness, unspecified: Secondary | ICD-10-CM | POA: Diagnosis not present

## 2021-11-01 DIAGNOSIS — R69 Illness, unspecified: Secondary | ICD-10-CM | POA: Diagnosis not present

## 2021-11-04 DIAGNOSIS — I4819 Other persistent atrial fibrillation: Secondary | ICD-10-CM | POA: Diagnosis not present

## 2021-11-09 DIAGNOSIS — R69 Illness, unspecified: Secondary | ICD-10-CM | POA: Diagnosis not present

## 2021-11-17 DIAGNOSIS — R69 Illness, unspecified: Secondary | ICD-10-CM | POA: Diagnosis not present

## 2021-11-21 ENCOUNTER — Other Ambulatory Visit: Payer: Self-pay | Admitting: Family Medicine

## 2021-11-28 ENCOUNTER — Other Ambulatory Visit: Payer: Self-pay | Admitting: Family Medicine

## 2021-12-01 DIAGNOSIS — R69 Illness, unspecified: Secondary | ICD-10-CM | POA: Diagnosis not present

## 2021-12-02 ENCOUNTER — Ambulatory Visit: Payer: Self-pay

## 2021-12-02 NOTE — Telephone Encounter (Signed)
FYI

## 2021-12-02 NOTE — Telephone Encounter (Signed)
  Chief Complaint: UTI s/s Symptoms: Frequency, painful urination, urgency Frequency: today Pertinent Negatives: Patient denies  Disposition: '[]'$ ED /'[x]'$ Urgent Care (no appt availability in office) / '[]'$ Appointment(In office/virtual)/ '[]'$  [place] [facility]/ '[]'$ Home Care/ '[]'$ Refused Recommended Disposition /'[]'$ [place] [facility]/ '[]'$  Follow-up with PCP Additional Notes: Pt is having similar s/s to UTI she had in [place] this year.   Reason for Disposition  > 2 UTI's in last year  Answer Assessment - Initial Assessment Questions 1. SYMPTOM: "What's the main symptom you're concerned about?" (e.g., frequency, incontinence)     Frequency, Burning with urination 2. ONSET: "When did the  UTI  start?"     today 3. PAIN: "Is there any pain?" If Yes, ask: "How bad is it?" (Scale: 1-10; mild, moderate, severe)     4/10 4. CAUSE: "What do you think is causing the symptoms?"     UTI 5. OTHER SYMPTOMS: "Do you have any other symptoms?" (e.g., blood in urine, fever, flank pain, pain with urination)     Pain with urination 6. PREGNANCY: "Is there any chance you are pregnant?" "When was your last menstrual period?"     na  Answer Assessment - Initial Assessment Questions 1. SEVERITY: "How bad is the pain?"  (e.g., Scale 1-10; mild, moderate, or severe)   - MILD (1-3): complains slightly about urination hurting   - MODERATE (4-7): interferes with normal activities     - SEVERE (8-10): excruciating, unwilling or unable to urinate because of the pain      4/10 2. FREQUENCY: "How many times have you had painful urination today?"       3. PATTERN: "Is pain present every time you urinate or just sometimes?"      Every time 4. ONSET: "When did the painful urination start?"      today 5. FEVER: "Do you have a fever?" If Yes, ask: "What is your temperature, how was it measured, and when did it start?"      6. PAST UTI: "Have you had a urine infection before?" If Yes, ask: "When was the last time?" and  "What happened that time?"      Yes this year 7. CAUSE: "What do you think is causing the painful urination?"  (e.g., UTI, scratch, Herpes sore)     UTI 8. OTHER SYMPTOMS: "Do you have any other symptoms?" (e.g., blood in urine, flank pain, genital sores, urgency, vaginal discharge)      9. PREGNANCY: "Is there any chance you are pregnant?" "When was your last menstrual period?"  Protocols used: Urinary Symptoms-A-AH, Urination Pain - Female-A-AH

## 2021-12-03 DIAGNOSIS — R8290 Unspecified abnormal findings in urine: Secondary | ICD-10-CM | POA: Diagnosis not present

## 2021-12-03 DIAGNOSIS — R399 Unspecified symptoms and signs involving the genitourinary system: Secondary | ICD-10-CM | POA: Diagnosis not present

## 2021-12-05 ENCOUNTER — Other Ambulatory Visit: Payer: Self-pay | Admitting: Family Medicine

## 2021-12-13 DIAGNOSIS — I471 Supraventricular tachycardia: Secondary | ICD-10-CM | POA: Diagnosis not present

## 2021-12-14 DIAGNOSIS — Z8679 Personal history of other diseases of the circulatory system: Secondary | ICD-10-CM | POA: Diagnosis not present

## 2021-12-14 DIAGNOSIS — I484 Atypical atrial flutter: Secondary | ICD-10-CM | POA: Diagnosis not present

## 2021-12-14 DIAGNOSIS — I517 Cardiomegaly: Secondary | ICD-10-CM | POA: Diagnosis not present

## 2021-12-14 DIAGNOSIS — Z4682 Encounter for fitting and adjustment of non-vascular catheter: Secondary | ICD-10-CM | POA: Diagnosis not present

## 2021-12-14 DIAGNOSIS — I480 Paroxysmal atrial fibrillation: Secondary | ICD-10-CM | POA: Diagnosis not present

## 2021-12-14 DIAGNOSIS — I471 Supraventricular tachycardia: Secondary | ICD-10-CM | POA: Diagnosis not present

## 2021-12-14 DIAGNOSIS — J948 Other specified pleural conditions: Secondary | ICD-10-CM | POA: Diagnosis not present

## 2021-12-14 DIAGNOSIS — Z452 Encounter for adjustment and management of vascular access device: Secondary | ICD-10-CM | POA: Diagnosis not present

## 2021-12-14 DIAGNOSIS — Z9889 Other specified postprocedural states: Secondary | ICD-10-CM | POA: Diagnosis not present

## 2021-12-14 DIAGNOSIS — I4891 Unspecified atrial fibrillation: Secondary | ICD-10-CM | POA: Diagnosis not present

## 2021-12-14 DIAGNOSIS — J9811 Atelectasis: Secondary | ICD-10-CM | POA: Diagnosis not present

## 2021-12-14 DIAGNOSIS — J90 Pleural effusion, not elsewhere classified: Secondary | ICD-10-CM | POA: Diagnosis not present

## 2021-12-14 HISTORY — PX: THORACOSCOPY: SUR1347

## 2021-12-15 DIAGNOSIS — E785 Hyperlipidemia, unspecified: Secondary | ICD-10-CM | POA: Diagnosis not present

## 2021-12-15 DIAGNOSIS — I4891 Unspecified atrial fibrillation: Secondary | ICD-10-CM | POA: Diagnosis not present

## 2021-12-15 DIAGNOSIS — R69 Illness, unspecified: Secondary | ICD-10-CM | POA: Diagnosis not present

## 2021-12-15 DIAGNOSIS — J90 Pleural effusion, not elsewhere classified: Secondary | ICD-10-CM | POA: Diagnosis not present

## 2021-12-15 DIAGNOSIS — D72829 Elevated white blood cell count, unspecified: Secondary | ICD-10-CM | POA: Diagnosis not present

## 2021-12-15 DIAGNOSIS — I480 Paroxysmal atrial fibrillation: Secondary | ICD-10-CM | POA: Diagnosis not present

## 2021-12-15 DIAGNOSIS — E119 Type 2 diabetes mellitus without complications: Secondary | ICD-10-CM | POA: Diagnosis not present

## 2021-12-15 DIAGNOSIS — I471 Supraventricular tachycardia: Secondary | ICD-10-CM | POA: Diagnosis not present

## 2021-12-15 DIAGNOSIS — I160 Hypertensive urgency: Secondary | ICD-10-CM | POA: Diagnosis not present

## 2021-12-15 DIAGNOSIS — Z4682 Encounter for fitting and adjustment of non-vascular catheter: Secondary | ICD-10-CM | POA: Diagnosis not present

## 2021-12-15 DIAGNOSIS — J9811 Atelectasis: Secondary | ICD-10-CM | POA: Diagnosis not present

## 2021-12-15 DIAGNOSIS — I10 Essential (primary) hypertension: Secondary | ICD-10-CM | POA: Diagnosis not present

## 2021-12-15 DIAGNOSIS — Z9889 Other specified postprocedural states: Secondary | ICD-10-CM | POA: Diagnosis not present

## 2021-12-15 DIAGNOSIS — I517 Cardiomegaly: Secondary | ICD-10-CM | POA: Diagnosis not present

## 2021-12-15 DIAGNOSIS — J948 Other specified pleural conditions: Secondary | ICD-10-CM | POA: Diagnosis not present

## 2021-12-15 DIAGNOSIS — Z452 Encounter for adjustment and management of vascular access device: Secondary | ICD-10-CM | POA: Diagnosis not present

## 2021-12-15 DIAGNOSIS — Z8679 Personal history of other diseases of the circulatory system: Secondary | ICD-10-CM | POA: Diagnosis not present

## 2021-12-15 DIAGNOSIS — D62 Acute posthemorrhagic anemia: Secondary | ICD-10-CM | POA: Diagnosis not present

## 2021-12-15 DIAGNOSIS — I4892 Unspecified atrial flutter: Secondary | ICD-10-CM | POA: Diagnosis not present

## 2021-12-15 DIAGNOSIS — K5909 Other constipation: Secondary | ICD-10-CM | POA: Diagnosis not present

## 2021-12-15 DIAGNOSIS — G8912 Acute post-thoracotomy pain: Secondary | ICD-10-CM | POA: Diagnosis not present

## 2021-12-16 DIAGNOSIS — Z8679 Personal history of other diseases of the circulatory system: Secondary | ICD-10-CM | POA: Diagnosis not present

## 2021-12-16 DIAGNOSIS — I471 Supraventricular tachycardia: Secondary | ICD-10-CM | POA: Diagnosis not present

## 2021-12-16 DIAGNOSIS — I10 Essential (primary) hypertension: Secondary | ICD-10-CM | POA: Diagnosis not present

## 2021-12-16 DIAGNOSIS — I4892 Unspecified atrial flutter: Secondary | ICD-10-CM | POA: Diagnosis not present

## 2021-12-16 DIAGNOSIS — Z4682 Encounter for fitting and adjustment of non-vascular catheter: Secondary | ICD-10-CM | POA: Diagnosis not present

## 2021-12-16 DIAGNOSIS — K567 Ileus, unspecified: Secondary | ICD-10-CM | POA: Diagnosis not present

## 2021-12-16 DIAGNOSIS — R112 Nausea with vomiting, unspecified: Secondary | ICD-10-CM | POA: Diagnosis not present

## 2021-12-16 DIAGNOSIS — I4891 Unspecified atrial fibrillation: Secondary | ICD-10-CM | POA: Diagnosis not present

## 2021-12-16 DIAGNOSIS — J9811 Atelectasis: Secondary | ICD-10-CM | POA: Diagnosis not present

## 2021-12-16 DIAGNOSIS — G8912 Acute post-thoracotomy pain: Secondary | ICD-10-CM | POA: Diagnosis not present

## 2021-12-16 DIAGNOSIS — E119 Type 2 diabetes mellitus without complications: Secondary | ICD-10-CM | POA: Diagnosis not present

## 2021-12-16 DIAGNOSIS — E785 Hyperlipidemia, unspecified: Secondary | ICD-10-CM | POA: Diagnosis not present

## 2021-12-16 DIAGNOSIS — R69 Illness, unspecified: Secondary | ICD-10-CM | POA: Diagnosis not present

## 2021-12-16 DIAGNOSIS — D62 Acute posthemorrhagic anemia: Secondary | ICD-10-CM | POA: Diagnosis not present

## 2021-12-16 DIAGNOSIS — K5909 Other constipation: Secondary | ICD-10-CM | POA: Diagnosis not present

## 2021-12-16 DIAGNOSIS — J90 Pleural effusion, not elsewhere classified: Secondary | ICD-10-CM | POA: Diagnosis not present

## 2021-12-16 DIAGNOSIS — I480 Paroxysmal atrial fibrillation: Secondary | ICD-10-CM | POA: Diagnosis not present

## 2021-12-16 DIAGNOSIS — I160 Hypertensive urgency: Secondary | ICD-10-CM | POA: Diagnosis not present

## 2021-12-17 DIAGNOSIS — Z452 Encounter for adjustment and management of vascular access device: Secondary | ICD-10-CM | POA: Diagnosis not present

## 2021-12-17 DIAGNOSIS — I480 Paroxysmal atrial fibrillation: Secondary | ICD-10-CM | POA: Diagnosis not present

## 2021-12-17 DIAGNOSIS — J9811 Atelectasis: Secondary | ICD-10-CM | POA: Diagnosis not present

## 2021-12-17 DIAGNOSIS — Z4682 Encounter for fitting and adjustment of non-vascular catheter: Secondary | ICD-10-CM | POA: Diagnosis not present

## 2021-12-17 DIAGNOSIS — I4891 Unspecified atrial fibrillation: Secondary | ICD-10-CM | POA: Diagnosis not present

## 2021-12-17 DIAGNOSIS — J942 Hemothorax: Secondary | ICD-10-CM | POA: Diagnosis not present

## 2021-12-17 DIAGNOSIS — I517 Cardiomegaly: Secondary | ICD-10-CM | POA: Diagnosis not present

## 2021-12-17 DIAGNOSIS — I471 Supraventricular tachycardia: Secondary | ICD-10-CM | POA: Diagnosis not present

## 2021-12-17 DIAGNOSIS — R918 Other nonspecific abnormal finding of lung field: Secondary | ICD-10-CM | POA: Diagnosis not present

## 2021-12-17 DIAGNOSIS — J811 Chronic pulmonary edema: Secondary | ICD-10-CM | POA: Diagnosis not present

## 2021-12-17 DIAGNOSIS — Z8679 Personal history of other diseases of the circulatory system: Secondary | ICD-10-CM | POA: Diagnosis not present

## 2021-12-17 DIAGNOSIS — K567 Ileus, unspecified: Secondary | ICD-10-CM | POA: Diagnosis not present

## 2021-12-17 DIAGNOSIS — R112 Nausea with vomiting, unspecified: Secondary | ICD-10-CM | POA: Diagnosis not present

## 2021-12-17 DIAGNOSIS — J90 Pleural effusion, not elsewhere classified: Secondary | ICD-10-CM | POA: Diagnosis not present

## 2021-12-18 DIAGNOSIS — I480 Paroxysmal atrial fibrillation: Secondary | ICD-10-CM | POA: Diagnosis not present

## 2021-12-18 DIAGNOSIS — J811 Chronic pulmonary edema: Secondary | ICD-10-CM | POA: Diagnosis not present

## 2021-12-18 DIAGNOSIS — J90 Pleural effusion, not elsewhere classified: Secondary | ICD-10-CM | POA: Diagnosis not present

## 2021-12-19 ENCOUNTER — Ambulatory Visit: Payer: 59 | Admitting: Dermatology

## 2021-12-19 DIAGNOSIS — R110 Nausea: Secondary | ICD-10-CM | POA: Diagnosis not present

## 2021-12-19 DIAGNOSIS — J9811 Atelectasis: Secondary | ICD-10-CM | POA: Diagnosis not present

## 2021-12-19 DIAGNOSIS — Z9889 Other specified postprocedural states: Secondary | ICD-10-CM | POA: Diagnosis not present

## 2021-12-19 DIAGNOSIS — Z7901 Long term (current) use of anticoagulants: Secondary | ICD-10-CM | POA: Diagnosis not present

## 2021-12-19 DIAGNOSIS — I480 Paroxysmal atrial fibrillation: Secondary | ICD-10-CM | POA: Diagnosis not present

## 2021-12-19 DIAGNOSIS — I471 Supraventricular tachycardia: Secondary | ICD-10-CM | POA: Diagnosis not present

## 2021-12-19 DIAGNOSIS — Z4682 Encounter for fitting and adjustment of non-vascular catheter: Secondary | ICD-10-CM | POA: Diagnosis not present

## 2021-12-19 DIAGNOSIS — J942 Hemothorax: Secondary | ICD-10-CM | POA: Diagnosis not present

## 2021-12-19 DIAGNOSIS — J811 Chronic pulmonary edema: Secondary | ICD-10-CM | POA: Diagnosis not present

## 2021-12-19 DIAGNOSIS — J90 Pleural effusion, not elsewhere classified: Secondary | ICD-10-CM | POA: Diagnosis not present

## 2021-12-19 DIAGNOSIS — Z452 Encounter for adjustment and management of vascular access device: Secondary | ICD-10-CM | POA: Diagnosis not present

## 2021-12-19 DIAGNOSIS — Z9911 Dependence on respirator [ventilator] status: Secondary | ICD-10-CM | POA: Diagnosis not present

## 2021-12-19 DIAGNOSIS — I517 Cardiomegaly: Secondary | ICD-10-CM | POA: Diagnosis not present

## 2021-12-20 DIAGNOSIS — Z452 Encounter for adjustment and management of vascular access device: Secondary | ICD-10-CM | POA: Diagnosis not present

## 2021-12-20 DIAGNOSIS — J90 Pleural effusion, not elsewhere classified: Secondary | ICD-10-CM | POA: Diagnosis not present

## 2021-12-20 DIAGNOSIS — N200 Calculus of kidney: Secondary | ICD-10-CM | POA: Diagnosis not present

## 2021-12-20 DIAGNOSIS — R931 Abnormal findings on diagnostic imaging of heart and coronary circulation: Secondary | ICD-10-CM | POA: Diagnosis not present

## 2021-12-20 DIAGNOSIS — Z4682 Encounter for fitting and adjustment of non-vascular catheter: Secondary | ICD-10-CM | POA: Diagnosis not present

## 2021-12-20 DIAGNOSIS — R918 Other nonspecific abnormal finding of lung field: Secondary | ICD-10-CM | POA: Diagnosis not present

## 2021-12-20 DIAGNOSIS — I471 Supraventricular tachycardia: Secondary | ICD-10-CM | POA: Diagnosis not present

## 2021-12-20 DIAGNOSIS — J942 Hemothorax: Secondary | ICD-10-CM | POA: Diagnosis not present

## 2021-12-20 DIAGNOSIS — I480 Paroxysmal atrial fibrillation: Secondary | ICD-10-CM | POA: Diagnosis not present

## 2021-12-21 DIAGNOSIS — J811 Chronic pulmonary edema: Secondary | ICD-10-CM | POA: Diagnosis not present

## 2021-12-21 DIAGNOSIS — J942 Hemothorax: Secondary | ICD-10-CM | POA: Diagnosis not present

## 2021-12-21 DIAGNOSIS — Z4682 Encounter for fitting and adjustment of non-vascular catheter: Secondary | ICD-10-CM | POA: Diagnosis not present

## 2021-12-21 DIAGNOSIS — J90 Pleural effusion, not elsewhere classified: Secondary | ICD-10-CM | POA: Diagnosis not present

## 2021-12-21 DIAGNOSIS — Z9889 Other specified postprocedural states: Secondary | ICD-10-CM | POA: Diagnosis not present

## 2021-12-21 DIAGNOSIS — I517 Cardiomegaly: Secondary | ICD-10-CM | POA: Diagnosis not present

## 2021-12-21 DIAGNOSIS — Z452 Encounter for adjustment and management of vascular access device: Secondary | ICD-10-CM | POA: Diagnosis not present

## 2021-12-22 DIAGNOSIS — I471 Supraventricular tachycardia: Secondary | ICD-10-CM | POA: Diagnosis not present

## 2021-12-22 DIAGNOSIS — Z4682 Encounter for fitting and adjustment of non-vascular catheter: Secondary | ICD-10-CM | POA: Diagnosis not present

## 2021-12-22 DIAGNOSIS — Z452 Encounter for adjustment and management of vascular access device: Secondary | ICD-10-CM | POA: Diagnosis not present

## 2021-12-22 DIAGNOSIS — I517 Cardiomegaly: Secondary | ICD-10-CM | POA: Diagnosis not present

## 2021-12-22 DIAGNOSIS — J811 Chronic pulmonary edema: Secondary | ICD-10-CM | POA: Diagnosis not present

## 2021-12-22 DIAGNOSIS — J942 Hemothorax: Secondary | ICD-10-CM | POA: Diagnosis not present

## 2021-12-22 DIAGNOSIS — J90 Pleural effusion, not elsewhere classified: Secondary | ICD-10-CM | POA: Diagnosis not present

## 2021-12-22 DIAGNOSIS — Z9889 Other specified postprocedural states: Secondary | ICD-10-CM | POA: Diagnosis not present

## 2021-12-23 DIAGNOSIS — Z452 Encounter for adjustment and management of vascular access device: Secondary | ICD-10-CM | POA: Diagnosis not present

## 2021-12-23 DIAGNOSIS — N189 Chronic kidney disease, unspecified: Secondary | ICD-10-CM | POA: Diagnosis not present

## 2021-12-23 DIAGNOSIS — I4891 Unspecified atrial fibrillation: Secondary | ICD-10-CM | POA: Diagnosis not present

## 2021-12-23 DIAGNOSIS — I484 Atypical atrial flutter: Secondary | ICD-10-CM | POA: Diagnosis not present

## 2021-12-23 DIAGNOSIS — D631 Anemia in chronic kidney disease: Secondary | ICD-10-CM | POA: Diagnosis not present

## 2021-12-23 DIAGNOSIS — J811 Chronic pulmonary edema: Secondary | ICD-10-CM | POA: Diagnosis not present

## 2021-12-23 DIAGNOSIS — Z4682 Encounter for fitting and adjustment of non-vascular catheter: Secondary | ICD-10-CM | POA: Diagnosis not present

## 2021-12-23 DIAGNOSIS — J90 Pleural effusion, not elsewhere classified: Secondary | ICD-10-CM | POA: Diagnosis not present

## 2021-12-23 DIAGNOSIS — R918 Other nonspecific abnormal finding of lung field: Secondary | ICD-10-CM | POA: Diagnosis not present

## 2021-12-23 DIAGNOSIS — I471 Supraventricular tachycardia: Secondary | ICD-10-CM | POA: Diagnosis not present

## 2021-12-23 DIAGNOSIS — I517 Cardiomegaly: Secondary | ICD-10-CM | POA: Diagnosis not present

## 2021-12-23 DIAGNOSIS — Z8679 Personal history of other diseases of the circulatory system: Secondary | ICD-10-CM | POA: Diagnosis not present

## 2021-12-23 DIAGNOSIS — E669 Obesity, unspecified: Secondary | ICD-10-CM | POA: Diagnosis not present

## 2021-12-23 DIAGNOSIS — E1122 Type 2 diabetes mellitus with diabetic chronic kidney disease: Secondary | ICD-10-CM | POA: Diagnosis not present

## 2021-12-23 DIAGNOSIS — I129 Hypertensive chronic kidney disease with stage 1 through stage 4 chronic kidney disease, or unspecified chronic kidney disease: Secondary | ICD-10-CM | POA: Diagnosis not present

## 2021-12-23 DIAGNOSIS — J942 Hemothorax: Secondary | ICD-10-CM | POA: Diagnosis not present

## 2021-12-23 DIAGNOSIS — Z6829 Body mass index (BMI) 29.0-29.9, adult: Secondary | ICD-10-CM | POA: Diagnosis not present

## 2021-12-24 DIAGNOSIS — I480 Paroxysmal atrial fibrillation: Secondary | ICD-10-CM | POA: Diagnosis not present

## 2021-12-24 DIAGNOSIS — J90 Pleural effusion, not elsewhere classified: Secondary | ICD-10-CM | POA: Diagnosis not present

## 2021-12-24 DIAGNOSIS — I471 Supraventricular tachycardia: Secondary | ICD-10-CM | POA: Diagnosis not present

## 2021-12-25 DIAGNOSIS — I480 Paroxysmal atrial fibrillation: Secondary | ICD-10-CM | POA: Diagnosis not present

## 2021-12-25 DIAGNOSIS — I471 Supraventricular tachycardia: Secondary | ICD-10-CM | POA: Diagnosis not present

## 2021-12-25 DIAGNOSIS — J90 Pleural effusion, not elsewhere classified: Secondary | ICD-10-CM | POA: Diagnosis not present

## 2021-12-26 DIAGNOSIS — G4734 Idiopathic sleep related nonobstructive alveolar hypoventilation: Secondary | ICD-10-CM | POA: Diagnosis not present

## 2021-12-26 DIAGNOSIS — I480 Paroxysmal atrial fibrillation: Secondary | ICD-10-CM | POA: Diagnosis not present

## 2021-12-26 DIAGNOSIS — I471 Supraventricular tachycardia: Secondary | ICD-10-CM | POA: Diagnosis not present

## 2021-12-26 DIAGNOSIS — R918 Other nonspecific abnormal finding of lung field: Secondary | ICD-10-CM | POA: Diagnosis not present

## 2021-12-26 DIAGNOSIS — Z4682 Encounter for fitting and adjustment of non-vascular catheter: Secondary | ICD-10-CM | POA: Diagnosis not present

## 2021-12-26 DIAGNOSIS — R931 Abnormal findings on diagnostic imaging of heart and coronary circulation: Secondary | ICD-10-CM | POA: Diagnosis not present

## 2021-12-26 DIAGNOSIS — Z452 Encounter for adjustment and management of vascular access device: Secondary | ICD-10-CM | POA: Diagnosis not present

## 2021-12-26 DIAGNOSIS — J90 Pleural effusion, not elsewhere classified: Secondary | ICD-10-CM | POA: Diagnosis not present

## 2021-12-28 DIAGNOSIS — R69 Illness, unspecified: Secondary | ICD-10-CM | POA: Diagnosis not present

## 2022-01-09 DIAGNOSIS — I480 Paroxysmal atrial fibrillation: Secondary | ICD-10-CM | POA: Diagnosis not present

## 2022-01-09 DIAGNOSIS — J90 Pleural effusion, not elsewhere classified: Secondary | ICD-10-CM | POA: Diagnosis not present

## 2022-01-09 DIAGNOSIS — J942 Hemothorax: Secondary | ICD-10-CM | POA: Diagnosis not present

## 2022-01-09 LAB — HEMOGLOBIN A1C: Hemoglobin A1C: 5.6

## 2022-01-10 DIAGNOSIS — I517 Cardiomegaly: Secondary | ICD-10-CM | POA: Diagnosis not present

## 2022-01-10 DIAGNOSIS — E119 Type 2 diabetes mellitus without complications: Secondary | ICD-10-CM | POA: Diagnosis not present

## 2022-01-10 DIAGNOSIS — J939 Pneumothorax, unspecified: Secondary | ICD-10-CM | POA: Diagnosis not present

## 2022-01-10 DIAGNOSIS — D649 Anemia, unspecified: Secondary | ICD-10-CM | POA: Diagnosis not present

## 2022-01-10 DIAGNOSIS — J942 Hemothorax: Secondary | ICD-10-CM | POA: Diagnosis not present

## 2022-01-10 DIAGNOSIS — I484 Atypical atrial flutter: Secondary | ICD-10-CM | POA: Diagnosis not present

## 2022-01-10 DIAGNOSIS — I10 Essential (primary) hypertension: Secondary | ICD-10-CM | POA: Diagnosis not present

## 2022-01-10 DIAGNOSIS — B958 Unspecified staphylococcus as the cause of diseases classified elsewhere: Secondary | ICD-10-CM | POA: Diagnosis not present

## 2022-01-10 DIAGNOSIS — Z4682 Encounter for fitting and adjustment of non-vascular catheter: Secondary | ICD-10-CM | POA: Diagnosis not present

## 2022-01-10 DIAGNOSIS — R5381 Other malaise: Secondary | ICD-10-CM | POA: Diagnosis not present

## 2022-01-10 DIAGNOSIS — Z888 Allergy status to other drugs, medicaments and biological substances status: Secondary | ICD-10-CM | POA: Diagnosis not present

## 2022-01-10 DIAGNOSIS — B9620 Unspecified Escherichia coli [E. coli] as the cause of diseases classified elsewhere: Secondary | ICD-10-CM | POA: Diagnosis not present

## 2022-01-10 DIAGNOSIS — J811 Chronic pulmonary edema: Secondary | ICD-10-CM | POA: Diagnosis not present

## 2022-01-10 DIAGNOSIS — I4819 Other persistent atrial fibrillation: Secondary | ICD-10-CM | POA: Diagnosis not present

## 2022-01-10 DIAGNOSIS — Z7901 Long term (current) use of anticoagulants: Secondary | ICD-10-CM | POA: Diagnosis not present

## 2022-01-10 DIAGNOSIS — E785 Hyperlipidemia, unspecified: Secondary | ICD-10-CM | POA: Diagnosis not present

## 2022-01-10 DIAGNOSIS — Z85828 Personal history of other malignant neoplasm of skin: Secondary | ICD-10-CM | POA: Diagnosis not present

## 2022-01-10 DIAGNOSIS — G8918 Other acute postprocedural pain: Secondary | ICD-10-CM | POA: Diagnosis not present

## 2022-01-10 DIAGNOSIS — I480 Paroxysmal atrial fibrillation: Secondary | ICD-10-CM | POA: Diagnosis not present

## 2022-01-10 DIAGNOSIS — N390 Urinary tract infection, site not specified: Secondary | ICD-10-CM | POA: Diagnosis not present

## 2022-01-10 DIAGNOSIS — G4733 Obstructive sleep apnea (adult) (pediatric): Secondary | ICD-10-CM | POA: Diagnosis not present

## 2022-01-10 DIAGNOSIS — I4891 Unspecified atrial fibrillation: Secondary | ICD-10-CM | POA: Diagnosis not present

## 2022-01-10 DIAGNOSIS — R0902 Hypoxemia: Secondary | ICD-10-CM | POA: Diagnosis not present

## 2022-01-10 DIAGNOSIS — Z9049 Acquired absence of other specified parts of digestive tract: Secondary | ICD-10-CM | POA: Diagnosis not present

## 2022-01-10 DIAGNOSIS — K2950 Unspecified chronic gastritis without bleeding: Secondary | ICD-10-CM | POA: Diagnosis not present

## 2022-01-10 DIAGNOSIS — R918 Other nonspecific abnormal finding of lung field: Secondary | ICD-10-CM | POA: Diagnosis not present

## 2022-01-10 DIAGNOSIS — J9811 Atelectasis: Secondary | ICD-10-CM | POA: Diagnosis not present

## 2022-01-10 DIAGNOSIS — J90 Pleural effusion, not elsewhere classified: Secondary | ICD-10-CM | POA: Diagnosis not present

## 2022-01-10 DIAGNOSIS — R8271 Bacteriuria: Secondary | ICD-10-CM | POA: Diagnosis not present

## 2022-01-10 DIAGNOSIS — F419 Anxiety disorder, unspecified: Secondary | ICD-10-CM | POA: Diagnosis not present

## 2022-01-10 DIAGNOSIS — R931 Abnormal findings on diagnostic imaging of heart and coronary circulation: Secondary | ICD-10-CM | POA: Diagnosis not present

## 2022-01-10 DIAGNOSIS — Z7982 Long term (current) use of aspirin: Secondary | ICD-10-CM | POA: Diagnosis not present

## 2022-01-10 DIAGNOSIS — K660 Peritoneal adhesions (postprocedural) (postinfection): Secondary | ICD-10-CM | POA: Diagnosis not present

## 2022-01-10 DIAGNOSIS — R0602 Shortness of breath: Secondary | ICD-10-CM | POA: Diagnosis not present

## 2022-01-10 DIAGNOSIS — Z87891 Personal history of nicotine dependence: Secondary | ICD-10-CM | POA: Diagnosis not present

## 2022-01-10 DIAGNOSIS — B957 Other staphylococcus as the cause of diseases classified elsewhere: Secondary | ICD-10-CM | POA: Diagnosis not present

## 2022-01-10 DIAGNOSIS — Z87442 Personal history of urinary calculi: Secondary | ICD-10-CM | POA: Diagnosis not present

## 2022-01-10 DIAGNOSIS — Z9851 Tubal ligation status: Secondary | ICD-10-CM | POA: Diagnosis not present

## 2022-01-10 DIAGNOSIS — Z79899 Other long term (current) drug therapy: Secondary | ICD-10-CM | POA: Diagnosis not present

## 2022-01-10 LAB — IRON,TIBC AND FERRITIN PANEL
Ferritin: 86
Iron: 25
TIBC: 361

## 2022-01-13 HISTORY — PX: LUNG SURGERY: SHX703

## 2022-01-13 HISTORY — PX: THORACOSCOPY: SUR1347

## 2022-01-16 ENCOUNTER — Ambulatory Visit: Payer: 59 | Admitting: Family Medicine

## 2022-01-18 DIAGNOSIS — Z4682 Encounter for fitting and adjustment of non-vascular catheter: Secondary | ICD-10-CM | POA: Diagnosis not present

## 2022-01-18 DIAGNOSIS — J948 Other specified pleural conditions: Secondary | ICD-10-CM | POA: Diagnosis not present

## 2022-01-18 DIAGNOSIS — J942 Hemothorax: Secondary | ICD-10-CM | POA: Diagnosis not present

## 2022-01-18 DIAGNOSIS — I517 Cardiomegaly: Secondary | ICD-10-CM | POA: Diagnosis not present

## 2022-01-25 DIAGNOSIS — I483 Typical atrial flutter: Secondary | ICD-10-CM | POA: Diagnosis not present

## 2022-01-25 DIAGNOSIS — I517 Cardiomegaly: Secondary | ICD-10-CM | POA: Diagnosis not present

## 2022-01-25 DIAGNOSIS — Z4889 Encounter for other specified surgical aftercare: Secondary | ICD-10-CM | POA: Diagnosis not present

## 2022-01-25 DIAGNOSIS — Z9889 Other specified postprocedural states: Secondary | ICD-10-CM | POA: Diagnosis not present

## 2022-01-25 DIAGNOSIS — R918 Other nonspecific abnormal finding of lung field: Secondary | ICD-10-CM | POA: Diagnosis not present

## 2022-01-25 DIAGNOSIS — G4734 Idiopathic sleep related nonobstructive alveolar hypoventilation: Secondary | ICD-10-CM | POA: Diagnosis not present

## 2022-01-25 DIAGNOSIS — I482 Chronic atrial fibrillation, unspecified: Secondary | ICD-10-CM | POA: Diagnosis not present

## 2022-01-25 DIAGNOSIS — I471 Supraventricular tachycardia, unspecified: Secondary | ICD-10-CM | POA: Diagnosis not present

## 2022-01-25 DIAGNOSIS — I480 Paroxysmal atrial fibrillation: Secondary | ICD-10-CM | POA: Diagnosis not present

## 2022-01-26 ENCOUNTER — Other Ambulatory Visit: Payer: Self-pay | Admitting: Family Medicine

## 2022-02-02 ENCOUNTER — Other Ambulatory Visit: Payer: Self-pay | Admitting: Family Medicine

## 2022-02-02 ENCOUNTER — Encounter: Payer: Self-pay | Admitting: Family Medicine

## 2022-02-02 NOTE — Telephone Encounter (Signed)
Requested medication (s) are due for refill today: yes  Requested medication (s) are on the active medication list: yes  Last refill:  12/06/21  Future visit scheduled: yes  Notes to clinic:  Unable to refill per protocol, cannot delegate. Patient has OV scheduled 02/27/22, routing for review.     Requested Prescriptions  Pending Prescriptions Disp Refills   zolpidem (AMBIEN) 5 MG tablet [Pharmacy Med Name: Zolpidem Tartrate 5 MG Oral Tablet] 30 tablet 0    Sig: TAKE 1 TABLET BY MOUTH AT BEDTIME AS NEEDED FOR SLEEP     Not Delegated - Psychiatry:  Anxiolytics/Hypnotics Failed - 02/02/2022 10:36 AM      Failed - This refill cannot be delegated      Failed - Urine Drug Screen completed in last 360 days      Failed - Valid encounter within last 6 months    Recent Outpatient Visits           6 months ago Hematuria, unspecified type   [place], DO   6 months ago Type 2 diabetes mellitus with other specified complication, without long-term current use of insulin [facility])   [facility] [place], Dionne Bucy, MD   1 year ago Encounter for annual health examination   [facility] [place], Dionne Bucy, MD   1 year ago Moderate episode of recurrent major depressive disorder [facility])   [place] Bacigalupo, Dionne Bucy, MD   1 year ago Chronic constipation   [facility] [place], Dionne Bucy, MD       Future Appointments             In 3 weeks Bacigalupo, Dionne Bucy, MD [facility], [place]   In 2 months Brendolyn Patty, MD [place]

## 2022-02-02 NOTE — Telephone Encounter (Signed)
Pt called to report that she is completely out of her current supply and that she has an appt scheduled at the end of the month. Says she just had 4 surgeries within the last month at [facility].

## 2022-02-14 DIAGNOSIS — R918 Other nonspecific abnormal finding of lung field: Secondary | ICD-10-CM | POA: Diagnosis not present

## 2022-02-14 DIAGNOSIS — I517 Cardiomegaly: Secondary | ICD-10-CM | POA: Diagnosis not present

## 2022-02-14 DIAGNOSIS — I480 Paroxysmal atrial fibrillation: Secondary | ICD-10-CM | POA: Diagnosis not present

## 2022-02-14 DIAGNOSIS — E119 Type 2 diabetes mellitus without complications: Secondary | ICD-10-CM | POA: Diagnosis not present

## 2022-02-14 DIAGNOSIS — J90 Pleural effusion, not elsewhere classified: Secondary | ICD-10-CM | POA: Diagnosis not present

## 2022-02-14 LAB — BASIC METABOLIC PANEL
BUN: 12 (ref 4–21)
CO2: 24 — AB (ref 13–22)
Chloride: 104 (ref 99–108)
Creatinine: 0.7 (ref 0.5–1.1)
Glucose: 168
Potassium: 3.9 mEq/L (ref 3.5–5.1)
Sodium: 135 — AB (ref 137–147)

## 2022-02-14 LAB — CBC AND DIFFERENTIAL
HCT: 29 — AB (ref 36–46)
Hemoglobin: 8.5 — AB (ref 12.0–16.0)
Platelets: 284 10*3/uL (ref 150–400)
WBC: 9

## 2022-02-14 LAB — COMPREHENSIVE METABOLIC PANEL
Calcium: 8.9 (ref 8.7–10.7)
eGFR: 98

## 2022-02-14 LAB — CBC: RBC: 3.36 — AB (ref 3.87–5.11)

## 2022-02-17 ENCOUNTER — Encounter: Payer: Self-pay | Admitting: Family Medicine

## 2022-02-21 NOTE — Progress Notes (Signed)
I,Sulibeya S Dimas,acting as a scribe for Lavon Paganini, MD.,have documented all relevant documentation on the behalf of Lavon Paganini, MD,as directed by  Lavon Paganini, MD while in the presence of Lavon Paganini, MD.     Established patient visit   Patient: Julia Stark   DOB: 09-Jul-1960   61 y.o. Female  MRN: [id number] Visit Date: 02/27/2022  Today's healthcare provider: Lavon Paganini, MD   Chief Complaint  Patient presents with   Hypertension   Diabetes   Subjective    HPI  Diabetes Mellitus Type II, follow-up  Lab Results  Component Value Date   HGBA1C 5.6 01/09/2022   HGBA1C 6.2 (A) 07/14/2021   HGBA1C 6.5 (H) 11/10/2020   Last seen for diabetes 6 months ago.  Management since then includes continuing the same treatment. She reports excellent compliance with treatment. [name] not having side effects.   Home blood sugar records:  not checked  Episodes of hypoglycemia? No    Current insulin regiment: none Most Recent Eye Exam: UTD  --------------------------------------------------------------------------------------------------- Hypertension, follow-up  BP Readings from Last 3 Encounters:  02/27/22 115/73  08/02/21 110/68  07/14/21 106/66   Wt Readings from Last 3 Encounters:  02/27/22 185 lb 1.6 oz (84 kg)  08/02/21 197 lb (89.4 kg)  07/14/21 194 lb 12.8 oz (88.4 kg)     She was last seen for hypertension 6 months ago.  BP at that visit was 110/68. Management since that visit includes no changes. She reports excellent compliance with treatment. [name] not having side effects.  [name] not adherent to low salt diet.   Outside blood pressures are not being checked.  She does not smoke.  Use of agents associated with hypertension: none.   --------------------------------------------------------------------------------------------------- Depression, Follow-up  She  was last seen for this 6 months ago. Changes made at last  visit include no changes. Continue effexor at current dose.   She reports excellent compliance with treatment. [name] not having side effects.   She reports excellent tolerance of treatment. Current symptoms include: depressed mood, fatigue, feelings of worthlessness/guilt, hopelessness, hypersomnia, and insomnia She feels she is Worse since last visit.     02/27/2022    3:11 PM 08/02/2021    1:27 PM 07/14/2021    4:12 PM  Depression screen PHQ 2/9  Decreased Interest 1 0 1  Down, Depressed, Hopeless _0 PHQ - 2 Score _1 Altered sleeping 1 0 0  Tired, decreased energy _2 Change in appetite _3 Feeling bad or failure about yourself  1 0 0  Trouble concentrating 0 0 0  Moving slowly or fidgety/restless 0 0 0  Suicidal thoughts 0 0 0  PHQ-9 Score _4 Difficult doing work/chores Somewhat difficult Not difficult at all Not difficult at all    -----------------------------------------------------------------------------------------    Medications: Outpatient Medications Prior to Visit  Medication Sig   acetaminophen (TYLENOL) 325 MG tablet Take 325 mg by mouth.   apixaban (ELIQUIS) 5 MG TABS tablet Take 5 mg by mouth 2 (two) times daily.   aspirin 81 MG chewable tablet Chew 81 mg by mouth daily.   DILT-XR 180 MG 24 hr capsule Take 180 mg by mouth every 12 (twelve) hours.   ferrous sulfate 324 (65 Fe) MG TBEC Take by mouth.   metFORMIN (GLUCOPHAGE) 1000 MG tablet Take 1 tablet by mouth once daily   methocarbamol (ROBAXIN) 500 MG tablet Take by  mouth.   omeprazole (PRILOSEC OTC) 20 MG tablet Take 20 mg by mouth in the morning and at bedtime.   Potassium Chloride ER 20 MEQ TBCR Take 1 tablet by mouth 2 (two) times daily.   propafenone (RYTHMOL SR) 225 MG 12 hr capsule Take 225 mg by mouth 2 (two) times daily.   rosuvastatin (CRESTOR) 5 MG tablet Take by mouth.   Torsemide 40 MG TABS Take by mouth.   valACYclovir (VALTREX) 500 MG tablet Take 1 tablet by mouth once  daily   venlafaxine XR (EFFEXOR-XR) 150 MG 24 hr capsule TAKE 1 CAPSULE BY MOUTH ONCE DAILY WITH BREAKFAST   [DISCONTINUED] diltiazem (DILACOR XR) 120 MG 24 hr capsule Take 1 capsule (120 mg total) by mouth 2 (two) times daily.   [DISCONTINUED] linaclotide (LINZESS) 145 MCG CAPS capsule Take 1 capsule (145 mcg total) by mouth daily before breakfast.   [DISCONTINUED] zolpidem (AMBIEN) 5 MG tablet TAKE 1 TABLET BY MOUTH AT BEDTIME AS NEEDED FOR SLEEP   [DISCONTINUED] ramipril (ALTACE) 10 MG capsule Take 1 capsule by mouth once daily   No facility-administered medications prior to visit.    Review of Systems  Constitutional:  Positive for activity change, appetite change and fatigue. Negative for chills and fever.  Eyes:  Negative for visual disturbance.  Respiratory:  Positive for shortness of breath. Negative for cough and chest tightness.   Cardiovascular:  Positive for palpitations. Negative for chest pain and leg swelling.  Gastrointestinal:  Negative for abdominal pain, diarrhea, nausea and vomiting.  Endocrine: Negative for cold intolerance and heat intolerance.  Neurological:  Negative for dizziness, light-headedness and headaches.  Psychiatric/Behavioral:  Negative for decreased concentration and sleep disturbance. The patient is not nervous/anxious.        Objective    BP 115/73 (BP Location: Left Arm, Patient Position: Sitting, Cuff Size: Large)   Pulse (!) 121   Temp 98 F (36.7 C) (Oral)   Resp 16   Wt 185 lb 1.6 oz (84 kg)   LMP 04/13/2011   SpO2 93% Comment: 93-96 2L oxygen at night  BMI 28.14 kg/m  BP Readings from Last 3 Encounters:  02/27/22 115/73  08/02/21 110/68  07/14/21 106/66   Wt Readings from Last 3 Encounters:  02/27/22 185 lb 1.6 oz (84 kg)  08/02/21 197 lb (89.4 kg)  07/14/21 194 lb 12.8 oz (88.4 kg)      Physical Exam Vitals reviewed.  Constitutional:      General: [name] not in acute distress.    Appearance: Normal appearance. [name]  well-developed. [name] not diaphoretic.  HENT:     Head: Normocephalic and atraumatic.  Eyes:     General: No scleral icterus.    Conjunctiva/sclera: Conjunctivae normal.  Neck:     Thyroid: No thyromegaly.  Cardiovascular:     Rate and Rhythm: Normal rate. Rhythm irregular.     Heart sounds: Normal heart sounds.  Pulmonary:     Effort: Pulmonary effort is normal. No respiratory distress.     Breath sounds: Normal breath sounds. No wheezing, rhonchi or rales.  Musculoskeletal:     Cervical back: Neck supple.     Right lower leg: No edema.     Left lower leg: No edema.  Lymphadenopathy:     Cervical: No cervical adenopathy.  Skin:    General: Skin is warm and dry.  Neurological:     Mental Status: [name] alert and oriented to person, place, and time. Mental status is  at baseline.  Psychiatric:        Mood and Affect: Mood normal.        Behavior: Behavior normal.      Results for orders placed or performed in visit on 02/27/22  CBC and differential  Result Value Ref Range   Hemoglobin 8.5 (A) 12.0 - 16.0   HCT 29 (A) 36 - 46   Platelets 284 150 - 400 K/uL   WBC 9.0   CBC  Result Value Ref Range   RBC 3.36 (A) 3.87 - 8.27  Basic metabolic panel  Result Value Ref Range   Glucose 168    BUN 12 4 - 21   CO2 24 (A) 13 - 22   Creatinine 0.7 0.5 - 1.1   Potassium 3.9 3.5 - 5.1 mEq/L   Sodium 135 (A) 137 - 147   Chloride 104 99 - 108  Comprehensive metabolic panel  Result Value Ref Range   eGFR 98    Calcium 8.9 8.7 - 10.7  Iron, TIBC and Ferritin Panel  Result Value Ref Range   Ferritin 86    Iron 25    TIBC 361   Hemoglobin A1c  Result Value Ref Range   Hemoglobin A1C 5.6     Assessment & Plan     Problem List Items Addressed This Visit       Cardiovascular and Mediastinum   Hypertension associated with diabetes ([place])    Well controlled Continue current medications Reviewed recent metabolic panel      Relevant Medications   aspirin 81 MG chewable  tablet   Torsemide 40 MG TABS   DILT-XR 180 MG 24 hr capsule     Digestive   Chronic constipation    Chronic and fairly well controlled Continue linzess 145 mcg daily - refilled today        Endocrine   T2DM (type 2 diabetes mellitus) ([place]) - Primary    Well controlled with last A1c 5.6 (abstracted) Continue current medications UTD on vaccines, eye exam, foot exam (completed today) On ACEi/ARB Not on statin Discussed diet and exercise F/u in 6 months       Relevant Medications   aspirin 81 MG chewable tablet   Hyperlipidemia associated with type 2 diabetes mellitus ([place])    Reviewed last lipid panel Not currently on a statin Recheck FLP and CMP annually Discussed diet and exercise       Relevant Medications   aspirin 81 MG chewable tablet   Torsemide 40 MG TABS   DILT-XR 180 MG 24 hr capsule     Other   Primary insomnia    Fairly well controlled Continue ambien at current dose      Moderate episode of recurrent major depressive disorder ([place])    Worsened in the setting of recent health issues and hospitalization Continue therapy Will discuss brainspotting/EMDR with her therapist for 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 Continue effexor at current dose Can consider increasing if needed      Anemia    Worsened in the setting of recent surgery, but was decreasing before that Due for repeat colonoscopy - will call GI S/p pRBCs Recheck CBC and iron panel in 2 weeks Consider heme referral for possible IV iron - continue OTC PO supplement at this time      Relevant Medications   ferrous sulfate 324 (65 Fe) MG TBEC   Other Relevant Orders   CBC   Iron, TIBC and Ferritin Panel   Other Visit Diagnoses  Need for influenza vaccination       Relevant Orders   Flu Vaccine QUAD 17moIM (Fluarix, Fluzone & Alfiuria Quad PF) (Completed)        Return in about 6 months (around 08/28/2022) for CPE.      I, ALavon Paganini MD, have reviewed all documentation for this visit.  The documentation on 02/27/22 for the exam, diagnosis, procedures, and orders are all accurate and complete.   Zeva Leber, ADionne Bucy MD, MPH [facility]

## 2022-02-25 DIAGNOSIS — G4734 Idiopathic sleep related nonobstructive alveolar hypoventilation: Secondary | ICD-10-CM | POA: Diagnosis not present

## 2022-02-27 ENCOUNTER — Ambulatory Visit (INDEPENDENT_AMBULATORY_CARE_PROVIDER_SITE_OTHER): Payer: 59 | Admitting: Family Medicine

## 2022-02-27 ENCOUNTER — Encounter: Payer: Self-pay | Admitting: Family Medicine

## 2022-02-27 VITALS — BP 115/73 | HR 121 | Temp 98.0°F | Resp 16 | Wt 185.1 lb

## 2022-02-27 DIAGNOSIS — E1159 Type 2 diabetes mellitus with other circulatory complications: Secondary | ICD-10-CM | POA: Diagnosis not present

## 2022-02-27 DIAGNOSIS — K5909 Other constipation: Secondary | ICD-10-CM | POA: Diagnosis not present

## 2022-02-27 DIAGNOSIS — D649 Anemia, unspecified: Secondary | ICD-10-CM | POA: Diagnosis not present

## 2022-02-27 DIAGNOSIS — Z23 Encounter for immunization: Secondary | ICD-10-CM

## 2022-02-27 DIAGNOSIS — F331 Major depressive disorder, recurrent, moderate: Secondary | ICD-10-CM

## 2022-02-27 DIAGNOSIS — E1169 Type 2 diabetes mellitus with other specified complication: Secondary | ICD-10-CM

## 2022-02-27 DIAGNOSIS — I152 Hypertension secondary to endocrine disorders: Secondary | ICD-10-CM

## 2022-02-27 DIAGNOSIS — F5101 Primary insomnia: Secondary | ICD-10-CM

## 2022-02-27 DIAGNOSIS — R69 Illness, unspecified: Secondary | ICD-10-CM | POA: Diagnosis not present

## 2022-02-27 DIAGNOSIS — E785 Hyperlipidemia, unspecified: Secondary | ICD-10-CM | POA: Diagnosis not present

## 2022-02-27 MED ORDER — ZOLPIDEM TARTRATE 5 MG PO TABS: 5.0000 mg | ORAL_TABLET | Freq: Every evening | ORAL | 5 refills | Status: DC | PRN

## 2022-02-27 MED ORDER — LINACLOTIDE 145 MCG PO CAPS: 145.0000 ug | ORAL_CAPSULE | Freq: Every day | ORAL | 1 refills | Status: DC

## 2022-02-27 NOTE — Assessment & Plan Note (Signed)
Fairly well controlled Continue ambien at current dose

## 2022-02-27 NOTE — Assessment & Plan Note (Signed)
Worsened in the setting of recent surgery, but was decreasing before that Due for repeat colonoscopy - will call GI S/p pRBCs Recheck CBC and iron panel in 2 weeks Consider heme referral for possible IV iron - continue OTC PO supplement at this time

## 2022-02-27 NOTE — Assessment & Plan Note (Signed)
Well controlled with last A1c 5.6 (abstracted) Continue current medications UTD on vaccines, eye exam, foot exam (completed today) On ACEi/ARB Not on statin Discussed diet and exercise F/u in 6 months

## 2022-02-27 NOTE — Assessment & Plan Note (Signed)
Chronic and fairly well controlled Continue linzess 145 mcg daily - refilled today

## 2022-02-27 NOTE — Assessment & Plan Note (Signed)
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

## 2022-02-27 NOTE — Assessment & Plan Note (Signed)
Reviewed last lipid panel °Not currently on a statin °Recheck FLP and CMP annually °Discussed diet and exercise  °

## 2022-02-27 NOTE — Assessment & Plan Note (Signed)
Well controlled Continue current medications Reviewed recent metabolic panel 

## 2022-03-02 DIAGNOSIS — R69 Illness, unspecified: Secondary | ICD-10-CM | POA: Diagnosis not present

## 2022-03-14 DIAGNOSIS — D649 Anemia, unspecified: Secondary | ICD-10-CM | POA: Diagnosis not present

## 2022-03-15 ENCOUNTER — Encounter: Payer: Self-pay | Admitting: Internal Medicine

## 2022-03-15 LAB — CBC
Hematocrit: 29.9 % — ABNORMAL LOW (ref 34.0–46.6)
Hemoglobin: 9 g/dL — ABNORMAL LOW (ref 11.1–15.9)
MCH: 23.9 pg — ABNORMAL LOW (ref 26.6–33.0)
MCHC: 30.1 g/dL — ABNORMAL LOW (ref 31.5–35.7)
MCV: 79 fL (ref 79–97)
Platelets: 309 10*3/uL (ref 150–450)
RBC: 3.77 x10E6/uL (ref 3.77–5.28)
RDW: 15.9 % — ABNORMAL HIGH (ref 11.7–15.4)
WBC: 5.3 10*3/uL (ref 3.4–10.8)

## 2022-03-15 LAB — IRON,TIBC AND FERRITIN PANEL
Ferritin: 91 ng/mL (ref 15–150)
Iron Saturation: 8 % — CL (ref 15–55)
Iron: 32 ug/dL (ref 27–139)
Total Iron Binding Capacity: 379 ug/dL (ref 250–450)
UIBC: 347 ug/dL (ref 118–369)

## 2022-03-16 ENCOUNTER — Other Ambulatory Visit: Payer: Self-pay | Admitting: Family Medicine

## 2022-03-16 ENCOUNTER — Encounter: Payer: Self-pay | Admitting: Family Medicine

## 2022-03-16 MED ORDER — IRON (FERROUS SULFATE) 325 (65 FE) MG PO TABS: 325.0000 mg | ORAL_TABLET | Freq: Every day | ORAL | 5 refills | Status: DC

## 2022-03-16 NOTE — Telephone Encounter (Signed)
Requested Prescriptions  Pending Prescriptions Disp Refills   venlafaxine XR (EFFEXOR-XR) 150 MG 24 hr capsule [Pharmacy Med Name: Venlafaxine HCl ER 150 MG Oral Capsule Extended Release 24 Hour] 90 capsule 1    Sig: TAKE 1 CAPSULE BY MOUTH ONCE DAILY WITH BREAKFAST     Psychiatry: Antidepressants - SNRI - desvenlafaxine & venlafaxine Failed - 03/16/2022  3:27 PM      Failed - Lipid Panel in normal range within the last 12 months    Cholesterol, Total  Date Value Ref Range Status  07/15/2021 240 (H) 100 - 199 mg/dL Final   Cholesterol  Date Value Ref Range Status  10/18/2012 193 0 - 200 mg/dL Final   Ldl Cholesterol, Calc  Date Value Ref Range Status  10/18/2012 113 (H) 0 - 100 mg/dL Final   LDL Chol Calc (NIH)  Date Value Ref Range Status  07/15/2021 162 (H) 0 - 99 mg/dL Final   HDL Cholesterol  Date Value Ref Range Status  10/18/2012 37 (L) 40 - 60 mg/dL Final   HDL  Date Value Ref Range Status  07/15/2021 44 >39 mg/dL Final   Triglycerides  Date Value Ref Range Status  07/15/2021 184 (H) 0 - 149 mg/dL Final  10/18/2012 215 (H) 0 - 200 mg/dL Final         Passed - Cr in normal range and within 360 days    Creatinine  Date Value Ref Range Status  02/14/2022 0.7 0.5 - 1.1 Final  10/18/2012 0.73 0.60 - 1.30 mg/dL Final   Creatinine, Ser  Date Value Ref Range Status  07/15/2021 0.61 0.57 - 1.00 mg/dL Final         Passed - Completed PHQ-2 or PHQ-9 in the last 360 days      Passed - Last BP in normal range    BP Readings from Last 1 Encounters:  02/27/22 115/73         Passed - Valid encounter within last 6 months    Recent Outpatient Visits           2 weeks ago Type 2 diabetes mellitus with other specified complication, without long-term current use of insulin ([place])   [facility] [place], Dionne Bucy, MD   7 months ago Hematuria, unspecified type   [facility] Myles Gip, DO   8 months ago Type 2 diabetes mellitus  with other specified complication, without long-term current use of insulin [facility])   [place], Dionne Bucy, MD   1 year ago Encounter for annual health examination   [facility] [place], Dionne Bucy, MD   1 year ago Moderate episode of recurrent major depressive disorder [facility])   [place], Dionne Bucy, MD       Future Appointments             In 3 weeks Brendolyn Patty, MD [place]   In 5 months Bacigalupo, Dionne Bucy, MD [facility] [place]

## 2022-03-17 DIAGNOSIS — G4734 Idiopathic sleep related nonobstructive alveolar hypoventilation: Secondary | ICD-10-CM | POA: Diagnosis not present

## 2022-03-17 DIAGNOSIS — I471 Supraventricular tachycardia, unspecified: Secondary | ICD-10-CM | POA: Diagnosis not present

## 2022-03-22 ENCOUNTER — Encounter: Payer: Self-pay | Admitting: Family Medicine

## 2022-03-23 NOTE — Telephone Encounter (Signed)
Did a PA come through for this? That's the only form I can think of that we'd have to do

## 2022-04-07 ENCOUNTER — Encounter: Payer: Self-pay | Admitting: Family Medicine

## 2022-04-10 NOTE — Telephone Encounter (Signed)
Can we initiate a PA? If so, I agree to it.

## 2022-04-11 ENCOUNTER — Ambulatory Visit: Payer: Medicaid Other | Admitting: Dermatology

## 2022-04-11 DIAGNOSIS — L570 Actinic keratosis: Secondary | ICD-10-CM

## 2022-04-11 DIAGNOSIS — D2239 Melanocytic nevi of other parts of face: Secondary | ICD-10-CM

## 2022-04-11 DIAGNOSIS — I781 Nevus, non-neoplastic: Secondary | ICD-10-CM | POA: Diagnosis not present

## 2022-04-11 DIAGNOSIS — Z85828 Personal history of other malignant neoplasm of skin: Secondary | ICD-10-CM

## 2022-04-11 DIAGNOSIS — L578 Other skin changes due to chronic exposure to nonionizing radiation: Secondary | ICD-10-CM

## 2022-04-11 DIAGNOSIS — L814 Other melanin hyperpigmentation: Secondary | ICD-10-CM | POA: Diagnosis not present

## 2022-04-11 DIAGNOSIS — D229 Melanocytic nevi, unspecified: Secondary | ICD-10-CM

## 2022-04-11 NOTE — Progress Notes (Signed)
Follow-Up Visit   Subjective  Julia Stark is a 62 y.o. female who presents for the following: Skin Problem.  Patient presents to have several spots checked on her face and chest, more since heart and lung surgery. She has a history of BCC of the face.   The following portions of the chart were reviewed this encounter and updated as appropriate:       Review of Systems:  No other skin or systemic complaints except as noted in HPI or Assessment and Plan.  Objective  Well appearing patient in no apparent distress; mood and affect are within normal limits.  A focused examination was performed including face, chest. Relevant physical exam findings are noted in the Assessment and Plan.  chest Multiple telangiectasias of the chest.  Left Upper Lip x 1, L chin x 1, R upper lip x 1, L malar cheek x 1, L brow x 1, L forehead x 2, R malar cheek x 1 (8) Pink scaly macules.   L mid upper eyebrow 27m indistinct pink flesh papule  Left Earlobe 5.0 mm light tan speckled macule    Assessment & Plan  Actinic Damage - chronic, secondary to cumulative UV radiation exposure/sun exposure over time - diffuse scaly erythematous macules with underlying dyspigmentation - Recommend daily broad spectrum sunscreen SPF 30+ to sun-exposed areas, reapply every 2 hours as needed.  - Recommend staying in the shade or wearing long sleeves, sun glasses (UVA+UVB protection) and wide brim hats (4-inch brim around the entire circumference of the hat). - Call for new or changing lesions.  History of Basal Cell Carcinoma of the Skin - No evidence of recurrence today - Recommend regular full body skin exams - Recommend daily broad spectrum sunscreen SPF 30+ to sun-exposed areas, reapply every 2 hours as needed.  - Call if any new or changing lesions are noted between office visits  Lentigines - Scattered tan macules - Due to sun exposure - Benign-appearing, observe - Recommend daily broad spectrum  sunscreen SPF 30+ to sun-exposed areas, reapply every 2 hours as needed. - Call for any changes  Telangiectasias chest  Possibly due to recent chest and lung surgeries since they came up after surgery. No h/o exogenous estrogen.  LFTs normal 01/09/2022. Patient defers having LFTs checked again at this time- will get checked at next visit with PCP with rest of labs.   Recommend daily broad spectrum sunscreen SPF 30+ to sun-exposed areas, reapply every 2 hours as needed. Call for new or changing lesions.  Staying in the shade or wearing long sleeves, sun glasses (UVA+UVB protection) and wide brim hats (4-inch brim around the entire circumference of the hat) are also recommended for sun protection.     AK (actinic keratosis) (8) Left Upper Lip x 1, L chin x 1, R upper lip x 1, L malar cheek x 1, L brow x 1, L forehead x 2, R malar cheek x 1  Actinic keratoses are precancerous spots that appear secondary to cumulative UV radiation exposure/sun exposure over time. They are chronic with expected duration over 1 year. A portion of actinic keratoses will progress to squamous cell carcinoma of the skin. It is not possible to reliably predict which spots will progress to skin cancer and so treatment is recommended to prevent development of skin cancer.  Recommend daily broad spectrum sunscreen SPF 30+ to sun-exposed areas, reapply every 2 hours as needed.  Recommend staying in the shade or wearing long sleeves, sun glasses (UVA+UVB  protection) and wide brim hats (4-inch brim around the entire circumference of the hat). Call for new or changing lesions.  Destruction of lesion - Left Upper Lip x 1, L chin x 1, R upper lip x 1, L malar cheek x 1, L brow x 1, L forehead x 2, R malar cheek x 1  Destruction method: cryotherapy   Informed consent: discussed and consent obtained   Lesion destroyed using liquid nitrogen: Yes   Region frozen until ice ball extended beyond lesion: Yes   Outcome: patient  tolerated procedure well with no complications   Post-procedure details: wound care instructions given   Additional details:  Prior to procedure, discussed risks of blister formation, small wound, skin dyspigmentation, or rare scar following cryotherapy. Recommend Vaseline ointment to treated areas while healing.   Nevus L mid upper eyebrow  Benign-appearing.  Observation.  Call clinic for new or changing moles.  Recommend daily use of broad spectrum spf 30+ sunscreen to sun-exposed areas.   Lentigo Left Earlobe  Benign-appearing.  Observation.  Call clinic for new or changing lesions.  Recommend daily use of broad spectrum spf 30+ sunscreen to sun-exposed areas.      Return in about 3 months (around 07/11/2022) for AKs, TBSE.  IJamesetta Orleans, CMA, am acting as scribe for Brendolyn Patty, MD .  Documentation: I have reviewed the above documentation for accuracy and completeness, and I agree with the above.  Brendolyn Patty MD

## 2022-04-11 NOTE — Patient Instructions (Addendum)
Cryotherapy Aftercare  Wash gently with soap and water everyday.   Apply Vaseline and Band-Aid daily until healed.    Recommend daily broad spectrum sunscreen SPF 30+ to sun-exposed areas, reapply every 2 hours as needed. Call for new or changing lesions.  Staying in the shade or wearing long sleeves, sun glasses (UVA+UVB protection) and wide brim hats (4-inch brim around the entire circumference of the hat) are also recommended for sun protection.    Due to recent changes in healthcare laws, you may see results of your pathology and/or laboratory studies on MyChart before the doctors have had a chance to review them. We understand that in some cases there may be results that are confusing or concerning to you. Please understand that not all results are received at the same time and often the doctors may need to interpret multiple results in order to provide you with the best plan of care or course of treatment. Therefore, we ask that you please give [place] 2 business days to thoroughly review all your results before contacting the office for clarification. Should we see a critical lab result, you will be contacted sooner.   If You Need Anything After Your Visit  If you have any questions or concerns for your doctor, please call our main line at [phone] and press option 4 to reach your doctor's medical assistant. If no one answers, please leave a voicemail as directed and we will return your call as soon as possible. Messages left after 4 pm will be answered the following business day.   You may also send [place] a message via [place]. We typically respond to MyChart messages within 1-2 business days.  For prescription refills, please ask your pharmacy to contact our office. Our fax number is [phone].  If you have an urgent issue when the clinic is closed that cannot wait until the next business day, you can page your doctor at the number below.    Please note that while we do our best to be  available for urgent issues outside of office hours, we are not available 24/7.   If you have an urgent issue and are unable to reach [place], you may choose to seek medical care at your doctor's office, retail clinic, urgent care center, or emergency room.  If you have a medical emergency, please immediately call 911 or go to the emergency department.  Pager Numbers  - Dr. Nehemiah Massed: [phone]  - Dr. Laurence Ferrari: [phone]  - Dr. Nicole Kindred: [phone]  In the event of inclement weather, please call our main line at [phone] for an update on the status of any delays or closures.  Dermatology Medication Tips: Please keep the boxes that topical medications come in in order to help keep track of the instructions about where and how to use these. Pharmacies typically print the medication instructions only on the boxes and not directly on the medication tubes.   If your medication is too expensive, please contact our office at [phone] option 4 or send [place] a message through [place].   We are unable to tell what your co-pay for medications will be in advance as this is different depending on your insurance coverage. However, we may be able to find a substitute medication at lower cost or fill out paperwork to get insurance to cover a needed medication.   If a prior authorization is required to get your medication covered by your insurance company, please allow [place] 1-2 business days to complete this process.  Drug prices often vary depending on where the prescription is filled and some pharmacies may offer cheaper prices.  The website www.goodrx.com contains coupons for medications through different pharmacies. The prices here do not account for what the cost may be with help from insurance (it may be cheaper with your insurance), but the website can give you the price if you did not use any insurance.  - You can print the associated coupon and take it with your prescription to the pharmacy.  -  You may also stop by our office during regular business hours and pick up a GoodRx coupon card.  - If you need your prescription sent electronically to a different pharmacy, notify our office through [facility] or by phone at [phone] option 4.     Si Usted Necesita Algo Despus de Su Visita  Tambin puede enviarnos un mensaje a travs de Pharmacist, community. Por lo general respondemos a los mensajes de MyChart en el transcurso de 1 a 2 das hbiles.  Para renovar recetas, por favor pida a su farmacia que se ponga en contacto con nuestra oficina. Harland Dingwall de fax es [place] [phone].  Si tiene un asunto urgente cuando la clnica est cerrada y que no puede esperar hasta el siguiente da hbil, puede llamar/localizar a su doctor(a) al nmero que aparece a continuacin.   Por favor, tenga en cuenta que aunque hacemos todo lo posible para estar disponibles para asuntos urgentes fuera del horario de [place], no estamos disponibles las 24 horas del da, los 7 das de la [place].   Si tiene un problema urgente y no puede comunicarse con nosotros, puede optar por buscar atencin mdica  en el consultorio de su doctor(a), en una clnica privada, en un centro de atencin urgente o en una sala de emergencias.  Si tiene Engineering geologist, por favor llame inmediatamente al 911 o vaya a la sala de emergencias.  Nmeros de bper  - Dr. Nehemiah Massed: [phone]  - Dra. Moye: [phone]  - Dra. Nicole Kindred: [phone]  En caso de inclemencias del [place], por favor llame a Johnsie Kindred principal al [phone] para una actualizacin sobre el [place] de cualquier retraso o cierre.  Consejos para la medicacin en dermatologa: Por favor, guarde las cajas en las que vienen los medicamentos de uso tpico para ayudarle a seguir las instrucciones sobre dnde y cmo usarlos. Las farmacias generalmente imprimen las instrucciones del medicamento slo en las cajas y no directamente en los tubos del  [place].   Si su medicamento es muy caro, por favor, pngase en contacto con Zigmund Daniel llamando al [phone] y presione la opcin 4 o envenos un mensaje a travs de Pharmacist, community.   No podemos decirle cul ser su copago por los medicamentos por adelantado ya que esto es diferente dependiendo de la cobertura de su seguro. Sin embargo, es posible que podamos encontrar un medicamento sustituto a Electrical engineer un formulario para que el seguro cubra el medicamento que se considera necesario.   Si se requiere una autorizacin previa para que su compaa de seguros [place] su medicamento, por favor permtanos de 1 a 2 das hbiles para completar este proceso.  Los precios de los medicamentos varan con frecuencia dependiendo del Environmental consultant de dnde se surte la receta y alguna farmacias pueden ofrecer precios ms baratos.  El sitio web www.goodrx.com tiene cupones para medicamentos de Airline pilot. Los precios aqu no tienen en cuenta lo que podra costar con la ayuda del seguro (puede ser ms  barato con su seguro), pero el sitio web puede darle el precio si no utiliz ningn seguro.  - Puede imprimir el cupn correspondiente y llevarlo con su receta a la farmacia.  - Tambin puede pasar por nuestra oficina durante el horario de atencin regular y recoger una tarjeta de cupones de GoodRx.  - Si necesita que su receta se enve electrnicamente a una farmacia diferente, informe a nuestra oficina a travs de MyChart de [place] o por telfono llamando al [phone] y presione la opcin 4.  

## 2022-04-11 NOTE — Telephone Encounter (Signed)
PA started today.  

## 2022-04-17 DIAGNOSIS — I471 Supraventricular tachycardia, unspecified: Secondary | ICD-10-CM | POA: Diagnosis not present

## 2022-04-17 DIAGNOSIS — G4734 Idiopathic sleep related nonobstructive alveolar hypoventilation: Secondary | ICD-10-CM | POA: Diagnosis not present

## 2022-04-18 DIAGNOSIS — I471 Supraventricular tachycardia, unspecified: Secondary | ICD-10-CM | POA: Diagnosis not present

## 2022-04-18 DIAGNOSIS — I480 Paroxysmal atrial fibrillation: Secondary | ICD-10-CM | POA: Diagnosis not present

## 2022-04-18 DIAGNOSIS — Z7901 Long term (current) use of anticoagulants: Secondary | ICD-10-CM | POA: Diagnosis not present

## 2022-04-18 DIAGNOSIS — I483 Typical atrial flutter: Secondary | ICD-10-CM | POA: Diagnosis not present

## 2022-04-18 DIAGNOSIS — E119 Type 2 diabetes mellitus without complications: Secondary | ICD-10-CM | POA: Diagnosis not present

## 2022-04-18 DIAGNOSIS — I10 Essential (primary) hypertension: Secondary | ICD-10-CM | POA: Diagnosis not present

## 2022-04-19 ENCOUNTER — Encounter: Payer: Self-pay | Admitting: Family Medicine

## 2022-05-02 ENCOUNTER — Other Ambulatory Visit (INDEPENDENT_AMBULATORY_CARE_PROVIDER_SITE_OTHER): Payer: Medicaid Other

## 2022-05-02 ENCOUNTER — Telehealth: Payer: Self-pay

## 2022-05-02 ENCOUNTER — Ambulatory Visit: Payer: Medicaid Other | Admitting: Nurse Practitioner

## 2022-05-02 ENCOUNTER — Encounter: Payer: Self-pay | Admitting: Nurse Practitioner

## 2022-05-02 VITALS — BP 106/68 | HR 96 | Ht 68.0 in | Wt 185.0 lb

## 2022-05-02 DIAGNOSIS — I4891 Unspecified atrial fibrillation: Secondary | ICD-10-CM | POA: Diagnosis not present

## 2022-05-02 DIAGNOSIS — E782 Mixed hyperlipidemia: Secondary | ICD-10-CM | POA: Diagnosis not present

## 2022-05-02 DIAGNOSIS — Z86010 Personal history of colonic polyps: Secondary | ICD-10-CM

## 2022-05-02 DIAGNOSIS — I10 Essential (primary) hypertension: Secondary | ICD-10-CM | POA: Diagnosis not present

## 2022-05-02 DIAGNOSIS — I483 Typical atrial flutter: Secondary | ICD-10-CM | POA: Diagnosis not present

## 2022-05-02 DIAGNOSIS — D509 Iron deficiency anemia, unspecified: Secondary | ICD-10-CM

## 2022-05-02 DIAGNOSIS — Z7901 Long term (current) use of anticoagulants: Secondary | ICD-10-CM | POA: Diagnosis not present

## 2022-05-02 DIAGNOSIS — I471 Supraventricular tachycardia, unspecified: Secondary | ICD-10-CM | POA: Diagnosis not present

## 2022-05-02 DIAGNOSIS — E119 Type 2 diabetes mellitus without complications: Secondary | ICD-10-CM | POA: Diagnosis not present

## 2022-05-02 LAB — BASIC METABOLIC PANEL
BUN: 10 mg/dL (ref 6–23)
CO2: 30 mEq/L (ref 19–32)
Calcium: 9.3 mg/dL (ref 8.4–10.5)
Chloride: 98 mEq/L (ref 96–112)
Creatinine, Ser: 0.48 mg/dL (ref 0.40–1.20)
GFR: 102.05 mL/min (ref >60.00)
Glucose, Bld: 135 mg/dL — ABNORMAL HIGH (ref 70–99)
Potassium: 3.2 mEq/L — ABNORMAL LOW (ref 3.5–5.1)
Sodium: 138 mEq/L (ref 135–145)

## 2022-05-02 LAB — CBC WITH DIFFERENTIAL/PLATELET
Basophils Absolute: 0.1 10*3/uL (ref 0.0–0.1)
Basophils Relative: 0.9 % (ref 0.0–3.0)
Eosinophils Absolute: 0.1 10*3/uL (ref 0.0–0.7)
Eosinophils Relative: 2 % (ref 0.0–5.0)
HCT: 33.1 % — ABNORMAL LOW (ref 36.0–46.0)
Hemoglobin: 10.8 g/dL — ABNORMAL LOW (ref 12.0–15.0)
Lymphocytes Relative: 31 % (ref 12.0–46.0)
Lymphs Abs: 1.9 10*3/uL (ref 0.7–4.0)
MCHC: 32.7 g/dL (ref 30.0–36.0)
MCV: 81 fl (ref 78.0–100.0)
Monocytes Absolute: 0.6 10*3/uL (ref 0.1–1.0)
Monocytes Relative: 9.9 % (ref 3.0–12.0)
Neutro Abs: 3.4 10*3/uL (ref 1.4–7.7)
Neutrophils Relative %: 56.2 % (ref 43.0–77.0)
Platelets: 212 10*3/uL (ref 150.0–400.0)
RBC: 4.08 Mil/uL (ref 3.87–5.11)
RDW: 19.4 % — ABNORMAL HIGH (ref 11.5–15.5)
WBC: 6 10*3/uL (ref 4.0–10.5)

## 2022-05-02 LAB — IBC + FERRITIN
Ferritin: 41.6 ng/mL (ref 10.0–291.0)
Iron: 35 ug/dL — ABNORMAL LOW (ref 42–145)
Saturation Ratios: 7.3 % — ABNORMAL LOW (ref 20.0–50.0)
TIBC: 478.8 ug/dL — ABNORMAL HIGH (ref 250.0–450.0)
Transferrin: 342 mg/dL (ref 212.0–360.0)

## 2022-05-02 NOTE — Progress Notes (Signed)
05/02/2022 ALITHIA ZAVALETA [id number] [birth year]   CHIEF COMPLAINT: Schedule a colonoscopy   HISTORY OF PRESENT ILLNESS: De Burrs. Fleek is a 62 year old female with a past medical history of anxiety, depression, atrial fibrillation s/p multiple ablations/cardoversions on Eliquis, SVT, DM type II, kidney stones and colon polyps. Past cholecystectomy and tubal ligation. [name] known by Dr. Hilarie Fredrickson. She presents today for further evaluation prior to proceeding with a colonoscopy prescheduled on 05/15/2022.   She has a history of paroxysmal atrial fibrillation on chronic anticoagulation s/p convergent procedure with ablation 2/99/3716 complicated by a left hemothorax s/p chest tube placement, anemia, ileus and UTI which required hospital admission x 2 weeks.  Her clinical status stabilized and she was discharged home 12/26/2021.  She had progressive SOB, readmitted to the [facility] 01/10/2023 with anemia and recurrent pleural effusion. Hg 8.3 -> 8.6. S/P left VATS decortication on 01/13/2022. Staph Epidermis grew from the hemothorax, treated with Doxycycline per ID.  She was discharged home 01/18/2022 on Ferrous sulfate 325 mg daily and Ibuprofen 400 mg every 8 hours as needed.  She remains in atrial fibrillation on Eliquis, intolerant to Amiodarone, Flecanide and beta blockers. Currently on Diltiazem with dose increased earlier today her cardiologist.  [name] an appointment to see her EP cardiologist 08/03/2022. No chest pain.   She continues to have shortness of breath on a daily basis.  She requires oxygen 2 to 3 L nasal cannula at nighttime.  No hemoptysis.  She has intermittent nausea without vomiting.  No dysphagia or heartburn.  She has persistent pain to the left posterior thorax which radiates to the LUQ area which initially started when she had a left hemothorax as noted above.  She presents to our office today prior to pursuing a colonoscopy which was previously prescheduled in our  outpatient endo center on 05/15/2022.  She complains of having dysphagia, food gets stuck in her throat/upper esophagus which occurs most days since she underwent convergent procedure with ablation 12/14/2021.  No prior history of dysphagia.  He denies having any heartburn.  She received multiple courses of antibiotics for UTI and for staph epidermis in her pleural fluid/hemothorax.  No obvious oral thrush.  She underwent an EGD 10/26/2020 which showed a normal esophagus, gastritis and normal duodenum.  She has chronic constipation, she does not pass a bowel movement unless she takes Linzess.  She passes a loose nonbloody dark brown stool as long as she takes Linzess.  No bright red rectal bleeding or black stools.  Her most recent colonoscopy was 04/09/2017 which identified internal hemorrhoids, no polyps.  At that time, she was advised to repeat a colonoscopy in 5 years due to a prior history of adenomatous colon polyps per colonoscopy completed by Dr. Vira Agar in [place] from the age of 61.  Maternal uncle with history of colon cancer.  Labs 12/13/2021:  Hemoglobin 12.8.  Hematocrit 38.5. Labs 12/26/2021:  Hemoglobin   8.6.  Hematocrit 27. Labs 01/09/2022:  Hemoglobin   9.5.  Hematocrit 30.4. Labs 01/18/2022: Hemoglobin 8.6.   Hematocrit 28.3. Labs 03/14/2022: Hemoglobin 9.      Hematocrit 29.9.  Iron 32.  Iron saturation 8.  Ferritin 91.  PAST GI PROCEDURES:  EGD 10/26/2020: Normal esophagus Gastritis  Normal duodenum  1. Surgical [P], gastric antrum - CHRONIC GASTRITIS WITH INTRAEPITHELIAL LYMPHOCYTES, SEE COMMENT. - WARTHIN-STARRY IS NEGATIVE FOR HELICOBACTER PYLORI. - NO INTESTINAL METAPLASIA, DYSPLASIA, OR MALIGNANCY. 2. Surgical [P], fundus and gastric body - CHRONIC  GASTRITIS WITH INTRAEPITHELIAL LYMPHOCYTES, SEE COMMENT. - WARTHIN-STARRY IS NEGATIVE FOR HELICOBACTER PYLORI. - NO INTESTINAL METAPLASIA, DYSPLASIA, OR MALIGNANCY.  Colonoscopy 04/09/2017 by Dr. Gaylyn Cheers: Internal  hemorrhoids No polyps Recall colonoscopy 5 years for adenoma surveillance  Past Medical History:  Diagnosis Date   Anxiety    Arthritis    bilateral shoulders   Atrial fibrillation ([place])    Cancer ([place])    BASAL CELL CARCINOMA OF FACE   Colon polyp    COVID-19 11/26/2020   Depression    Diabetes mellitus without complication ([place])    Dysrhythmia    A fib   Hyperlipidemia    Hypertension    Sialoadenitis 12/24/2018   Past Surgical History:  Procedure Laterality Date   BASAL CELL CARCINOMA EXCISION     CARDIAC ELECTROPHYSIOLOGY MAPPING AND ABLATION  05/2019   CARDIOVERSION N/A 03/04/2019   Procedure: CARDIOVERSION;  Surgeon: Corey Skains, MD;  Location: [facility];  Service: Cardiovascular;  Laterality: N/A;   CARDIOVERSION N/A 06/11/2019   Procedure: CARDIOVERSION;  Surgeon: Corey Skains, MD;  Location: [facility];  Service: Cardiovascular;  Laterality: N/A;   CARDIOVERSION N/A 05/25/2020   Procedure: CARDIOVERSION;  Surgeon: Corey Skains, MD;  Location: [facility];  Service: Cardiovascular;  Laterality: N/A;   CARDIOVERSION N/A 11/25/2020   Procedure: CARDIOVERSION;  Surgeon: Corey Skains, MD;  Location: [facility];  Service: Cardiovascular;  Laterality: N/A;   [place]     COLONOSCOPY  02/12/2012   COLONOSCOPY WITH PROPOFOL N/A 04/09/2017   Procedure: COLONOSCOPY WITH PROPOFOL;  Surgeon: Manya Silvas, MD;  Location: [facility]COPY;  Service: Endoscopy;  Laterality: N/A;   ELECTROPHYSIOLOGIC STUDY N/A 05/12/2015   Procedure: Cardioversion;  Surgeon: Corey Skains, MD;  Location: [facility];  Service: Cardiovascular;  Laterality: N/A;   LUNG SURGERY Left 01/13/2022   THORACOSCOPY  12/14/2021   [facility] - THORACOSCOPIC ANY METH  ENDOSCOPY, SURGICAL; OPERATIVE TISSUE ABLATION AND RECONSTRUCTION OF  ATRIA, LIMITED (EG, MODIFIED MAZE PROCEDURE), WITHOUT CARDIOPULMONARY   THORACOSCOPY  01/13/2022   [facility] : THORACOSCOPY, SURGICAL; WITH  TOTAL PULMONARY DECORTICATION, INCLUDING INTRAPLEURAL PNEUMONOLYSIS   TUBAL LIGATION  1999    Allergies  Allergen Reactions   Amiodarone Other (See Comments)    Other Reaction(s): Other (See Comments), Vomiting  Blurred vision   Flecainide Nausea And Vomiting    Hallucination   Trazodone Other (See Comments)    headaches   Rosuvastatin Other (See Comments)    muscle cramps muscle cramps can tolerate low dose       Outpatient Encounter Medications as of 05/02/2022  Medication Sig   acetaminophen (TYLENOL) 325 MG tablet Take 325 mg by mouth.   apixaban (ELIQUIS) 5 MG TABS tablet Take 5 mg by mouth 2 (two) times daily.   aspirin EC 81 MG tablet Take 81 mg by mouth daily. Swallow whole.   DILT-XR 180 MG 24 hr capsule Take 180 mg by mouth every 12 (twelve) hours.   diltiazem (CARDIZEM) 60 MG tablet Take 60 mg by mouth. Take 1 tablet (60 mg total) by mouth once daily Take 1 tablet daily by mouth at 3 pm.   Iron, Ferrous Sulfate, 325 (65 Fe) MG TABS Take 325 mg by mouth daily.   linaclotide (LINZESS) 145 MCG CAPS capsule Take 1 capsule (145 mcg total) by mouth daily before breakfast.   Magnesium 400 MG CAPS Take 1 capsule by mouth daily at 12 noon.   metFORMIN (GLUCOPHAGE) 1000 MG  tablet Take 1 tablet by mouth once daily   omeprazole (PRILOSEC OTC) 20 MG tablet Take 20 mg by mouth in the morning and at bedtime.   rosuvastatin (CRESTOR) 5 MG tablet Take by mouth.   Torsemide 40 MG TABS Take by mouth.   venlafaxine XR (EFFEXOR-XR) 150 MG 24 hr capsule TAKE 1 CAPSULE BY MOUTH ONCE DAILY WITH BREAKFAST   zolpidem (AMBIEN) 5 MG tablet Take 1 tablet (5 mg total) by mouth at bedtime as needed. for sleep   No facility-administered encounter medications on file as of 05/02/2022.    REVIEW OF SYSTEMS:  Gen: Denies fever, sweats or chills. No weight loss.  CV: Denies chest pain, palpitations or edema. Resp: See HPI.  GI: See HPI.  GU : Denies urinary burning, blood in urine, increased  urinary frequency or incontinence. MS: Denies joint pain, muscles aches or weakness. Derm: Denies rash, itchiness, skin lesions or unhealing ulcers. Psych: Denies depression, anxiety, memory loss or confusion. Heme: Denies bruising, easy bleeding. Neuro:  Denies headaches, dizziness or paresthesias. Endo:  Denies any problems with DM, thyroid or adrenal function.  PHYSICAL EXAM: BP 106/68   Pulse 96   Ht '5\' 8"'$  (1.727 m)   Wt 185 lb (83.9 kg)   LMP 04/13/2011   SpO2 95%   BMI 28.13 kg/m  General: 62 year old female fatigued appearing in no acute distress. Head: Normocephalic and atraumatic. Eyes:  Sclerae non-icteric, conjunctive pink. Ears: Normal auditory acuity. Mouth: Dentition intact. No ulcers or lesions.  No thrush. Neck: Supple, no lymphadenopathy or thyromegaly.  Lungs: Breath sounds decreased to the left mid lobe to the base.  Right lung fields clear, slightly decreased in the bases. Heart: Regular rate and rhythm. No murmur, rub or gallop appreciated.  Abdomen: Soft, nontender, nondistended. No masses. No hepatosplenomegaly. Normoactive bowel sounds x 4 quadrants.  Rectal: No external hemorrhoids.  No significant internal hemorrhoids.  No stool or blood in the rectal vault. DD CMA present during exam. Musculoskeletal: Symmetrical with no gross deformities. Skin: Warm and dry. No rash or lesions on visible extremities. Extremities: No edema. Neurological: Alert oriented x 4, no focal deficits.  Psychological:  Alert and cooperative. Normal mood and affect.  ASSESSMENT AND PLAN:  62 year old female presents today to discuss scheduling colon polyp surveillance colonoscopy.  -Cancel colonoscopy pre-scheduled in [place] 05/15/2022, patient is not an [place] candidate  Atrial fibrillation s/p convergent procedure with ablation 12/14/2021 on Eliquis and Cardizem   Shortness of breath, persistent s/p left VATS 01/14/2023 for hydrothorax/recurrent pleural effusion -Recommend repeat  chest CT, defer to PCP and pulmonologist  Iron deficiency anemia, secondary to hospital admissions Sept and Oct 2023 s/p 5/39/7673 complicated by a left hemothorax and recurrent pleural effusion s/p left VATS 01/13/2022. No overt GI bleeding.  -Continue Ferrous Sulfate 325 mg 1 p.o. daily -CBC, iron panel and BMP  -Heme slides -Eventual EGD/colonoscopy at [facility] if stool heme positive in setting of chronic Eliquis use. -Request cardiac clearance by cardiologist Dr. Lujean Amel at [facility] prior to pursuing any future endoscopic evaluation. May also require pulmonary clearance.  Dysphagia, started during hospitalizations Sept and Oct 2023. Possible candidiasis esophagitis with associated antibiotic use.  -Consider barium swallow with tablet if EGD not pursued -Consider empiric treatment with Nystatin or Fluconazole (if no contraindications) for possible candidiasis esophagitis  -Increase Omeprazole 20 mg 1 p.o. twice daily -Patient to contact her office if her symptoms worsen  Chronic constipation -Continue Linzess 145 mcg daily  Today's encounter  was 45 minutes which included precharting, chart/result review, history/exam, face-to-face time used for counseling, formulating a treatment plan with follow-up and documentation.   CC:  Virginia Crews, MD

## 2022-05-02 NOTE — Patient Instructions (Addendum)
Your provider has requested that you go to the basement level for lab work before leaving today. Press "B" on the elevator. The lab is located at the first door on the left as you exit the elevator.  Increase Omeprazole '20mg'$  one capsule by mouth twice daily to be taken 30 minutes before breakfast and dinner  Complete stool cards.  Cut your food into small pieces, chew your food thoroughly and avoid eating large pieces of meat/bread or rice   Contact our office if your swallowing symptoms worsen   Due to recent changes in healthcare laws, you may see the results of your imaging and laboratory studies on MyChart before your provider has had a chance to review them.  We understand that in some cases there may be results that are confusing or concerning to you. Not all laboratory results come back in the same time frame and the provider may be waiting for multiple results in order to interpret others.  Please give [place] 48 hours in order for your provider to thoroughly review all the results before contacting the office for clarification of your results.    Thank you for trusting me with your gastrointestinal care!   Carl Best, CRNP

## 2022-05-02 NOTE — Progress Notes (Signed)
Addendum: Reviewed and agree with assessment and management plan.  Complex recent medical interventions. Given dysphagia related to ablation this may be motility or neuropathic. Treating empirically for Candida is low risk Would give fluconazole 200 mg x 1 day and 100 mg x 13 days Would proceed with barium esophagram with tablet before consideration of EGD Colonoscopy and possible EGD depending on barium esophagram would need to be done in the outpatient hospital setting due to oxygen use at home Isaiah Cianci, Lajuan Lines, MD

## 2022-05-02 NOTE — Progress Notes (Signed)
Julia Stark, I called patient and discussed Dr. Vena Rua recommendations as noted in his addendum.  Pls send in a RX for fluconazole 200 mg x 1 day and 100 mg x 13 days  Schedule her for a barium esophagram with tablet before consideration of EGD  Thanks

## 2022-05-03 ENCOUNTER — Other Ambulatory Visit: Payer: Self-pay

## 2022-05-03 ENCOUNTER — Telehealth: Payer: Self-pay

## 2022-05-03 DIAGNOSIS — B370 Candidal stomatitis: Secondary | ICD-10-CM

## 2022-05-03 DIAGNOSIS — R1310 Dysphagia, unspecified: Secondary | ICD-10-CM

## 2022-05-03 MED ORDER — FLUCONAZOLE 100 MG PO TABS: ORAL_TABLET | ORAL | 0 refills | Status: DC

## 2022-05-03 NOTE — Telephone Encounter (Signed)
Pt made aware of Dr. Hilarie Fredrickson and Carl Best NP recommendations. Prescription was sent to pharmacy. Pt made aware: Pt scheduled for a Barium Swallow test on 05/10/2022 at 2:00 PM at [facility]: Pt to arrive at 1:45 PM. Nothing to eat or drink 3 hours prior starting at 11:00 AM: Pt made aware. Pt verbalized understanding with all questions answered.

## 2022-05-03 NOTE — Telephone Encounter (Signed)
Message Received: Julia Stark, Julia Pour, NP  Julia Hu, RN      Previous Messages  Routed Note  Author: Noralyn Pick, NP Service: Gastroenterology Author Type: Nurse Practitioner  Filed: 05/02/2022  4:52 PM Encounter Date: 05/02/2022 Status: Signed  Editor: Julia Pick, NP (Nurse Practitioner)  Julia Stark, I called patient and discussed Dr. Vena Stark recommendations as noted in his addendum.  Pls send in a RX for fluconazole 200 mg x 1 day and 100 mg x 13 days   Schedule her for a barium esophagram with tablet before consideration of EGD   Thanks     Office Visit for Colonoscopy 05/02/2022 Julia Pick, NP - [facility] Diagnoses  Iron Deficiency Anemia, Unspecified Iron Deficiency Anemia Type (Primary) Personal history of colonic polyps Atrial fibrillation, unspecified type ([place]) Orders Signed This Visit  (5) Orders Pended This Visit  None Progress Notes  Julia Pick, NP at 05/02/2022 11:00 AM  Status: Signed  Julia Stark, I called patient and discussed Dr. Vena Stark recommendations as noted in his addendum.  Pls send in a RX for fluconazole 200 mg x 1 day and 100 mg x 13 days   Schedule her for a barium esophagram with tablet before consideration of EGD   Thanks    Julia Bears, MD at 05/02/2022 11:00 AM  Status: Signed  Addendum: Reviewed and agree with assessment and management plan.  Complex recent medical interventions. Given dysphagia related to ablation this may be motility or neuropathic. Treating empirically for Candida is low risk Would give fluconazole 200 mg x 1 day and 100 mg x 13 days Would proceed with barium esophagram with tablet before consideration of EGD Colonoscopy and possible EGD depending on barium esophagram would need to be done in the outpatient hospital setting due to oxygen use at home Julia Stark, Julia Lines, MD

## 2022-05-05 DIAGNOSIS — I5032 Chronic diastolic (congestive) heart failure: Secondary | ICD-10-CM | POA: Diagnosis not present

## 2022-05-05 DIAGNOSIS — I471 Supraventricular tachycardia, unspecified: Secondary | ICD-10-CM | POA: Diagnosis not present

## 2022-05-05 DIAGNOSIS — I484 Atypical atrial flutter: Secondary | ICD-10-CM | POA: Diagnosis not present

## 2022-05-09 NOTE — Telephone Encounter (Signed)
error 

## 2022-05-09 NOTE — Progress Notes (Signed)
   Patient ID: DESTYNIE TOOMEY, female    DOB: February 20, 1961, 62 y.o.   MRN: [id number]  HPI  Ms Channing is a 62 y/o female with a history of  Echo 01/12/22 showed an EF of 50% along with severe LAE/ RAE and moderate TR.   Admitted 01/09/22 due to L VATs decortication 01/13/22. Diuresed. Antibiotics given for UTI. Admitted 12/13/21 due to Convergent Procedure w/ CTS and EP teams on 12/14/21. Complicated by ileus. PRBC's given. Antibiotics given for UTI.    She presents today for her initial visit with a chief complaint of  Review of Systems    Physical Exam    Assessment & Plan:  1: Chronic heart failure with preserved ejection fraction with LAE- - NYHA class - saw cardiology Clayborn Bigness) 05/02/22  2: HTN- - BP - saw PCP (Bacigalupo) 02/27/22 - BMP 05/02/22 showed sodium 138, potassium 3.2, creatinine 0.48 & GFR 102.05  3: PAF- - saw EP Marcello Moores) 05/05/22   - multiple previous ablations/DCCV now s/p convergent procedure 12/14/21  - AV node ablation and dual-chamber pacemaker placement with conduction system pacing on 05/17/22   4: DM- - A1c 01/09/22 was 5.6%  5: Anxiety-

## 2022-05-09 NOTE — Telephone Encounter (Signed)
Error

## 2022-05-10 ENCOUNTER — Ambulatory Visit (HOSPITAL_COMMUNITY)
Admission: RE | Admit: 2022-05-10 | Discharge: 2022-05-10 | Disposition: A | Discharge status: Home / Self Care | Payer: Medicaid Other | Source: Ambulatory Visit | Attending: Internal Medicine | Admitting: Internal Medicine

## 2022-05-10 ENCOUNTER — Other Ambulatory Visit (HOSPITAL_COMMUNITY): Payer: Self-pay

## 2022-05-10 ENCOUNTER — Encounter: Payer: Self-pay | Admitting: Family

## 2022-05-10 ENCOUNTER — Ambulatory Visit: Payer: Medicaid Other | Attending: Family | Admitting: Family

## 2022-05-10 VITALS — BP 133/73 | HR 104 | Resp 18 | Wt 176.4 lb

## 2022-05-10 DIAGNOSIS — Z950 Presence of cardiac pacemaker: Secondary | ICD-10-CM | POA: Diagnosis not present

## 2022-05-10 DIAGNOSIS — R5383 Other fatigue: Secondary | ICD-10-CM | POA: Diagnosis not present

## 2022-05-10 DIAGNOSIS — F411 Generalized anxiety disorder: Secondary | ICD-10-CM

## 2022-05-10 DIAGNOSIS — Z79899 Other long term (current) drug therapy: Secondary | ICD-10-CM | POA: Diagnosis not present

## 2022-05-10 DIAGNOSIS — I5032 Chronic diastolic (congestive) heart failure: Secondary | ICD-10-CM | POA: Diagnosis not present

## 2022-05-10 DIAGNOSIS — I480 Paroxysmal atrial fibrillation: Secondary | ICD-10-CM | POA: Diagnosis not present

## 2022-05-10 DIAGNOSIS — I10 Essential (primary) hypertension: Secondary | ICD-10-CM

## 2022-05-10 DIAGNOSIS — Z7984 Long term (current) use of oral hypoglycemic drugs: Secondary | ICD-10-CM | POA: Insufficient documentation

## 2022-05-10 DIAGNOSIS — E119 Type 2 diabetes mellitus without complications: Secondary | ICD-10-CM | POA: Insufficient documentation

## 2022-05-10 DIAGNOSIS — K224 Dyskinesia of esophagus: Secondary | ICD-10-CM | POA: Diagnosis not present

## 2022-05-10 DIAGNOSIS — R1310 Dysphagia, unspecified: Secondary | ICD-10-CM | POA: Diagnosis not present

## 2022-05-10 DIAGNOSIS — I110 Hypertensive heart disease with heart failure: Secondary | ICD-10-CM | POA: Diagnosis not present

## 2022-05-10 DIAGNOSIS — F419 Anxiety disorder, unspecified: Secondary | ICD-10-CM | POA: Insufficient documentation

## 2022-05-10 DIAGNOSIS — Z833 Family history of diabetes mellitus: Secondary | ICD-10-CM | POA: Diagnosis not present

## 2022-05-10 DIAGNOSIS — R0602 Shortness of breath: Secondary | ICD-10-CM | POA: Insufficient documentation

## 2022-05-10 DIAGNOSIS — F32A Depression, unspecified: Secondary | ICD-10-CM | POA: Insufficient documentation

## 2022-05-10 DIAGNOSIS — E785 Hyperlipidemia, unspecified: Secondary | ICD-10-CM | POA: Insufficient documentation

## 2022-05-10 DIAGNOSIS — Z87440 Personal history of urinary (tract) infections: Secondary | ICD-10-CM | POA: Insufficient documentation

## 2022-05-10 DIAGNOSIS — Z9049 Acquired absence of other specified parts of digestive tract: Secondary | ICD-10-CM | POA: Insufficient documentation

## 2022-05-10 MED ORDER — POTASSIUM CHLORIDE CRYS ER 20 MEQ PO TBCR: 20.0000 meq | EXTENDED_RELEASE_TABLET | Freq: Every day | ORAL | 5 refills | Status: DC

## 2022-05-10 NOTE — Progress Notes (Signed)
[facility] - PHARMACIST COUNSELING NOTE  Guideline-Directed Medical Therapy/Evidence Based Medicine  ACE/ARB/ARNI:  None Beta Blocker:  None. Patient cannot tolerate beta-blockers Aldosterone Antagonist:  None.  Diuretic: Torsemide 40 mg daily SGLT2i:  None. Pt has a hx of UTIs. She plans to see urology.   Adherence Assessment  Do you ever forget to take your medication? '[]'$ Yes '[x]'$ No  Do you ever skip doses due to side effects? '[]'$ Yes '[x]'$ No  Do you have trouble affording your medicines? '[]'$ Yes '[x]'$ No  Are you ever unable to pick up your medication due to transportation difficulties? '[]'$ Yes '[x]'$ No  Do you ever stop taking your medications because you don't believe they are helping? '[]'$ Yes '[x]'$ No  Do you check your weight daily? '[]'$ Yes '[x]'$ No   Adherence strategy: Pill bottles  Barriers to obtaining medications: None stated by patient.   Vital signs: HR 104, BP 133/73, weight (pounds) 176 ECHO: Date 01/2022, EF > 50, notes left/right atrium severely enlarged, right ventricle mildly enlarged.      Latest Ref Rng & Units 05/02/2022   11:56 AM 02/14/2022   12:00 AM 07/15/2021    8:43 AM  BMP  Glucose 70 - 99 mg/dL 135   127   BUN 6 - 23 mg/dL '10  12     9   '$ Creatinine 0.40 - 1.20 mg/dL 0.48  0.7     0.61   BUN/Creat Ratio 12 - 28   15   Sodium 135 - 145 mEq/L 138  135     138   Potassium 3.5 - 5.1 mEq/L 3.2  3.9     4.2   Chloride 96 - 112 mEq/L 98  104     99   CO2 19 - 32 mEq/L '30  24     23   '$ Calcium 8.4 - 10.5 mg/dL 9.3  8.9     9.6      This result is from an external source.    Past Medical History:  Diagnosis Date   Anxiety    Arthritis    bilateral shoulders   Atrial fibrillation (HCC)    Cancer (HCC)    BASAL CELL CARCINOMA OF FACE   CHF (congestive heart failure) (HCC)    Colon polyp    COVID-19 11/26/2020   Depression    Diabetes mellitus without complication (HCC)    Dysrhythmia    A fib   Hyperlipidemia     Hypertension    Sialoadenitis 12/24/2018    ASSESSMENT 62 year old female who presents to the HF clinic for a new HF appointment. Patient has some complaints of swelling in her ankles and potentially in her lung, but pt had a lung colaspase a few months ago and is still healing. PMH: symptomatic PAF, atypical atrial flutter with multiple ablations/DCCV now s/p convergent procedure 9/13 with Dr. Antonieta Iba, HTN, HL, T2DM, anxiety/depression and chronic gastritis. Patient does not tolerate or feel well on metoprolol or beta-blockers. We discuss the expectations of the heart failure clinic and that this disease is heavy driven by medications and we may add on and adjust the dose of these medications often. We discussed to weight her self daily and measure BP often.   PLAN CHF?/HTN:  Continue torsemide. Call the clinic is you have a weight gain of > 3 lb in a 24 hour period.   DM: A1c 5.6 Continue metformin.   Afib:  Continue diltiazem as prescribed. Continue apixaban. Plan for a pacemaker in the  next couple of weeks.      Time spent: 45 minutes  Oswald Hillock, Pharm.D. Clinical Pharmacist 05/10/2022 12:05 PM    Current Outpatient Medications:    apixaban (ELIQUIS) 5 MG TABS tablet, Take 5 mg by mouth 2 (two) times daily., Disp: , Rfl:    aspirin EC 81 MG tablet, Take 81 mg by mouth daily. Swallow whole., Disp: , Rfl:    DILT-XR 180 MG 24 hr capsule, Take 180 mg by mouth every 12 (twelve) hours., Disp: , Rfl:    diltiazem (CARDIZEM) 60 MG tablet, Take 60 mg by mouth daily. Take 1 tablet (60 mg total) by mouth once daily around 3 pm., Disp: , Rfl:    fluconazole (DIFLUCAN) 100 MG tablet, Take 200 mg( Two Tablets) x 1 day and 100 mg (One Tablet) x 13 days (Patient taking differently: Take 100 mg by mouth daily.), Disp: 15 tablet, Rfl: 0   Iron, Ferrous Sulfate, 325 (65 Fe) MG TABS, Take 325 mg by mouth daily., Disp: 30 tablet, Rfl: 5   linaclotide (LINZESS) 145 MCG CAPS capsule, Take  1 capsule (145 mcg total) by mouth daily before breakfast. (Patient taking differently: Take 145 mcg by mouth daily as needed (constipation).), Disp: 90 capsule, Rfl: 1   Magnesium 400 MG CAPS, Take 1 capsule by mouth daily at 12 noon., Disp: , Rfl:    metFORMIN (GLUCOPHAGE) 1000 MG tablet, Take 1 tablet by mouth once daily, Disp: 90 tablet, Rfl: 1   omeprazole (PRILOSEC OTC) 20 MG tablet, Take 20 mg by mouth in the morning and at bedtime., Disp: , Rfl:    potassium chloride SA (KLOR-CON M) 20 MEQ tablet, Take 1 tablet (20 mEq total) by mouth daily., Disp: 30 tablet, Rfl: 5   rosuvastatin (CRESTOR) 5 MG tablet, Take by mouth., Disp: , Rfl:    Torsemide 40 MG TABS, Take 40 mg by mouth daily., Disp: , Rfl:    venlafaxine XR (EFFEXOR-XR) 150 MG 24 hr capsule, TAKE 1 CAPSULE BY MOUTH ONCE DAILY WITH BREAKFAST, Disp: 90 capsule, Rfl: 1   zolpidem (AMBIEN) 5 MG tablet, Take 1 tablet (5 mg total) by mouth at bedtime as needed. for sleep (Patient taking differently: Take 5 mg by mouth at bedtime. for sleep), Disp: 30 tablet, Rfl: 5   DRUGS TO CAUTION IN HEART FAILURE  Drug or Class Mechanism  Analgesics NSAIDs COX-2 inhibitors Glucocorticoids  Sodium and water retention, increased systemic vascular resistance, decreased response to diuretics   Diabetes Medications Metformin Thiazolidinediones Rosiglitazone (Avandia) Pioglitazone (Actos) DPP4 Inhibitors Saxagliptin (Onglyza) Sitagliptin (Januvia)   Lactic acidosis Possible calcium channel blockade   Unknown  Antiarrhythmics Class I  Flecainide Disopyramide Class III Sotalol Other Dronedarone  Negative inotrope, proarrhythmic   Proarrhythmic, beta blockade  Negative inotrope  Antihypertensives Alpha Blockers Doxazosin Calcium Channel Blockers Diltiazem Verapamil Nifedipine Central Alpha Adrenergics Moxonidine Peripheral Vasodilators Minoxidil  Increases renin and aldosterone  Negative inotrope    Possible  sympathetic withdrawal  Unknown  Anti-infective Itraconazole Amphotericin B  Negative inotrope Unknown  Hematologic Anagrelide Cilostazol   Possible inhibition of PD IV Inhibition of PD III causing arrhythmias  Neurologic/Psychiatric Stimulants Anti-Seizure Drugs Carbamazepine Pregabalin Antidepressants Tricyclics Citalopram Parkinsons Bromocriptine Pergolide Pramipexole Antipsychotics Clozapine Antimigraine Ergotamine Methysergide Appetite suppressants Bipolar Lithium  Peripheral alpha and beta agonist activity  Negative inotrope and chronotrope Calcium channel blockade  Negative inotrope, proarrhythmic Dose-dependent QT prolongation  Excessive serotonin activity/valvular damage Excessive serotonin activity/valvular damage Unknown  IgE mediated hypersensitivy, calcium channel blockade  Excessive serotonin activity/valvular damage Excessive serotonin activity/valvular damage Valvular damage  Direct myofibrillar degeneration, adrenergic stimulation  Antimalarials Chloroquine Hydroxychloroquine Intracellular inhibition of lysosomal enzymes  Urologic Agents Alpha Blockers Doxazosin Prazosin Tamsulosin Terazosin  Increased renin and aldosterone  Adapted from Page Carleene Overlie, et al. "Drugs That May Cause or Exacerbate Heart Failure: A Scientific Statement from the American Heart  Association." Circulation 2016; 154:M08-Q76. DOI: 10.1161/CIR.0000000000000426   MEDICATION ADHERENCES TIPS AND STRATEGIES Taking medication as prescribed improves patient outcomes in heart failure (reduces hospitalizations, improves symptoms, increases survival) Side effects of medications can be managed by decreasing doses, switching agents, stopping drugs, or adding additional therapy. Please let someone in the [facility] know if you have having bothersome side effects so we can modify your regimen. Do not alter your medication regimen without talking to [place].  Medication  reminders can help patients remember to take drugs on time. If you are missing or forgetting doses you can try linking behaviors, using pill boxes, or an electronic reminder like an alarm on your phone or an app. Some people can also get automated phone calls as medication reminders.

## 2022-05-10 NOTE — Patient Instructions (Signed)
Continue weighing daily and call for an overnight weight gain of 3 pounds or more or a weekly weight gain of more than 5 pounds. ? ? ?If you have voicemail, please make sure your mailbox is cleaned out so that we may leave a message and please make sure to listen to any voicemails.  ? ? ?

## 2022-05-11 ENCOUNTER — Encounter: Payer: Self-pay | Admitting: Critical Care Medicine

## 2022-05-11 DIAGNOSIS — I480 Paroxysmal atrial fibrillation: Secondary | ICD-10-CM | POA: Diagnosis not present

## 2022-05-11 DIAGNOSIS — F418 Other specified anxiety disorders: Secondary | ICD-10-CM | POA: Diagnosis not present

## 2022-05-11 DIAGNOSIS — Z7901 Long term (current) use of anticoagulants: Secondary | ICD-10-CM | POA: Diagnosis not present

## 2022-05-11 DIAGNOSIS — E119 Type 2 diabetes mellitus without complications: Secondary | ICD-10-CM | POA: Diagnosis not present

## 2022-05-11 DIAGNOSIS — I110 Hypertensive heart disease with heart failure: Secondary | ICD-10-CM | POA: Diagnosis not present

## 2022-05-11 DIAGNOSIS — R0609 Other forms of dyspnea: Secondary | ICD-10-CM

## 2022-05-11 DIAGNOSIS — D649 Anemia, unspecified: Secondary | ICD-10-CM | POA: Diagnosis not present

## 2022-05-11 DIAGNOSIS — J90 Pleural effusion, not elsewhere classified: Secondary | ICD-10-CM | POA: Diagnosis not present

## 2022-05-11 DIAGNOSIS — I5032 Chronic diastolic (congestive) heart failure: Secondary | ICD-10-CM | POA: Diagnosis not present

## 2022-05-11 DIAGNOSIS — I484 Atypical atrial flutter: Secondary | ICD-10-CM | POA: Diagnosis not present

## 2022-05-11 DIAGNOSIS — Z01818 Encounter for other preprocedural examination: Secondary | ICD-10-CM | POA: Diagnosis not present

## 2022-05-12 ENCOUNTER — Ambulatory Visit
Admission: RE | Admit: 2022-05-12 | Discharge: 2022-05-12 | Disposition: A | Discharge status: Home / Self Care | Payer: Medicaid Other | Attending: Anesthesiology | Admitting: Anesthesiology

## 2022-05-12 ENCOUNTER — Other Ambulatory Visit: Payer: Self-pay | Admitting: Anesthesiology

## 2022-05-12 ENCOUNTER — Ambulatory Visit
Admission: RE | Admit: 2022-05-12 | Discharge: 2022-05-12 | Disposition: A | Discharge status: Home / Self Care | Payer: Medicaid Other | Source: Ambulatory Visit | Attending: Anesthesiology | Admitting: Anesthesiology

## 2022-05-12 DIAGNOSIS — Z01818 Encounter for other preprocedural examination: Secondary | ICD-10-CM | POA: Insufficient documentation

## 2022-05-12 DIAGNOSIS — R0609 Other forms of dyspnea: Secondary | ICD-10-CM

## 2022-05-12 DIAGNOSIS — R000 Tachycardia, unspecified: Secondary | ICD-10-CM | POA: Diagnosis not present

## 2022-05-12 DIAGNOSIS — I4891 Unspecified atrial fibrillation: Secondary | ICD-10-CM | POA: Diagnosis not present

## 2022-05-12 DIAGNOSIS — R0600 Dyspnea, unspecified: Secondary | ICD-10-CM | POA: Diagnosis not present

## 2022-05-15 ENCOUNTER — Encounter: Payer: 59 | Admitting: Internal Medicine

## 2022-05-17 DIAGNOSIS — I484 Atypical atrial flutter: Secondary | ICD-10-CM | POA: Diagnosis not present

## 2022-05-17 DIAGNOSIS — J90 Pleural effusion, not elsewhere classified: Secondary | ICD-10-CM | POA: Diagnosis not present

## 2022-05-17 DIAGNOSIS — I110 Hypertensive heart disease with heart failure: Secondary | ICD-10-CM | POA: Diagnosis not present

## 2022-05-17 DIAGNOSIS — I5032 Chronic diastolic (congestive) heart failure: Secondary | ICD-10-CM | POA: Diagnosis not present

## 2022-05-17 DIAGNOSIS — Z7901 Long term (current) use of anticoagulants: Secondary | ICD-10-CM | POA: Diagnosis not present

## 2022-05-17 DIAGNOSIS — Z7984 Long term (current) use of oral hypoglycemic drugs: Secondary | ICD-10-CM | POA: Diagnosis not present

## 2022-05-17 DIAGNOSIS — I340 Nonrheumatic mitral (valve) insufficiency: Secondary | ICD-10-CM | POA: Diagnosis not present

## 2022-05-17 DIAGNOSIS — Z87891 Personal history of nicotine dependence: Secondary | ICD-10-CM | POA: Diagnosis not present

## 2022-05-17 DIAGNOSIS — Z8616 Personal history of COVID-19: Secondary | ICD-10-CM | POA: Diagnosis not present

## 2022-05-17 DIAGNOSIS — I4819 Other persistent atrial fibrillation: Secondary | ICD-10-CM | POA: Diagnosis not present

## 2022-05-17 DIAGNOSIS — Z888 Allergy status to other drugs, medicaments and biological substances status: Secondary | ICD-10-CM | POA: Diagnosis not present

## 2022-05-17 DIAGNOSIS — E119 Type 2 diabetes mellitus without complications: Secondary | ICD-10-CM | POA: Diagnosis not present

## 2022-05-17 DIAGNOSIS — I480 Paroxysmal atrial fibrillation: Secondary | ICD-10-CM | POA: Diagnosis not present

## 2022-05-17 DIAGNOSIS — I4719 Other supraventricular tachycardia: Secondary | ICD-10-CM | POA: Diagnosis not present

## 2022-05-17 DIAGNOSIS — I442 Atrioventricular block, complete: Secondary | ICD-10-CM | POA: Diagnosis not present

## 2022-05-17 HISTORY — PX: PACEMAKER IMPLANT: EP1218

## 2022-05-18 DIAGNOSIS — I110 Hypertensive heart disease with heart failure: Secondary | ICD-10-CM | POA: Diagnosis not present

## 2022-05-18 DIAGNOSIS — J90 Pleural effusion, not elsewhere classified: Secondary | ICD-10-CM | POA: Diagnosis not present

## 2022-05-18 DIAGNOSIS — E119 Type 2 diabetes mellitus without complications: Secondary | ICD-10-CM | POA: Diagnosis not present

## 2022-05-18 DIAGNOSIS — I4719 Other supraventricular tachycardia: Secondary | ICD-10-CM | POA: Diagnosis not present

## 2022-05-18 DIAGNOSIS — Z7984 Long term (current) use of oral hypoglycemic drugs: Secondary | ICD-10-CM | POA: Diagnosis not present

## 2022-05-18 DIAGNOSIS — Z950 Presence of cardiac pacemaker: Secondary | ICD-10-CM | POA: Diagnosis not present

## 2022-05-18 DIAGNOSIS — I484 Atypical atrial flutter: Secondary | ICD-10-CM | POA: Diagnosis not present

## 2022-05-18 DIAGNOSIS — I442 Atrioventricular block, complete: Secondary | ICD-10-CM | POA: Diagnosis not present

## 2022-05-18 DIAGNOSIS — Z888 Allergy status to other drugs, medicaments and biological substances status: Secondary | ICD-10-CM | POA: Diagnosis not present

## 2022-05-18 DIAGNOSIS — Z7901 Long term (current) use of anticoagulants: Secondary | ICD-10-CM | POA: Diagnosis not present

## 2022-05-18 DIAGNOSIS — Z87891 Personal history of nicotine dependence: Secondary | ICD-10-CM | POA: Diagnosis not present

## 2022-05-18 DIAGNOSIS — Z452 Encounter for adjustment and management of vascular access device: Secondary | ICD-10-CM | POA: Diagnosis not present

## 2022-05-18 DIAGNOSIS — I5032 Chronic diastolic (congestive) heart failure: Secondary | ICD-10-CM | POA: Diagnosis not present

## 2022-05-18 DIAGNOSIS — G4734 Idiopathic sleep related nonobstructive alveolar hypoventilation: Secondary | ICD-10-CM | POA: Diagnosis not present

## 2022-05-18 DIAGNOSIS — R918 Other nonspecific abnormal finding of lung field: Secondary | ICD-10-CM | POA: Diagnosis not present

## 2022-05-18 DIAGNOSIS — I340 Nonrheumatic mitral (valve) insufficiency: Secondary | ICD-10-CM | POA: Diagnosis not present

## 2022-05-18 DIAGNOSIS — Z9889 Other specified postprocedural states: Secondary | ICD-10-CM | POA: Diagnosis not present

## 2022-05-18 DIAGNOSIS — I480 Paroxysmal atrial fibrillation: Secondary | ICD-10-CM | POA: Diagnosis not present

## 2022-05-18 DIAGNOSIS — Z8616 Personal history of COVID-19: Secondary | ICD-10-CM | POA: Diagnosis not present

## 2022-05-18 DIAGNOSIS — I471 Supraventricular tachycardia, unspecified: Secondary | ICD-10-CM | POA: Diagnosis not present

## 2022-05-22 ENCOUNTER — Encounter: Payer: Self-pay | Admitting: Family

## 2022-05-22 ENCOUNTER — Encounter: Payer: Self-pay | Admitting: Family Medicine

## 2022-05-22 NOTE — Telephone Encounter (Signed)
Spoke with patient on the phone. She states she canceled her current sleep study with [facility] because she is still on pain medication at night related to pacemaker surgery last week. She would prefer to have test done locally. She verbalized agreement that Darylene Price can assess at pt's appt in two weeks time.

## 2022-05-26 ENCOUNTER — Other Ambulatory Visit: Payer: Medicaid Other

## 2022-05-29 ENCOUNTER — Other Ambulatory Visit: Payer: Medicaid Other

## 2022-05-30 ENCOUNTER — Other Ambulatory Visit (INDEPENDENT_AMBULATORY_CARE_PROVIDER_SITE_OTHER): Payer: Medicaid Other

## 2022-05-30 ENCOUNTER — Other Ambulatory Visit: Payer: Self-pay

## 2022-05-30 DIAGNOSIS — D509 Iron deficiency anemia, unspecified: Secondary | ICD-10-CM

## 2022-05-30 LAB — HEMOCCULT SLIDES (X 3 CARDS)
Fecal Occult Blood: NEGATIVE
OCCULT 1: NEGATIVE
OCCULT 2: NEGATIVE
OCCULT 3: NEGATIVE
OCCULT 4: NEGATIVE
OCCULT 5: NEGATIVE

## 2022-06-05 DIAGNOSIS — I480 Paroxysmal atrial fibrillation: Secondary | ICD-10-CM | POA: Diagnosis not present

## 2022-06-05 DIAGNOSIS — Z7901 Long term (current) use of anticoagulants: Secondary | ICD-10-CM | POA: Diagnosis not present

## 2022-06-05 DIAGNOSIS — R0602 Shortness of breath: Secondary | ICD-10-CM | POA: Diagnosis not present

## 2022-06-05 DIAGNOSIS — I482 Chronic atrial fibrillation, unspecified: Secondary | ICD-10-CM | POA: Diagnosis not present

## 2022-06-05 DIAGNOSIS — Z79899 Other long term (current) drug therapy: Secondary | ICD-10-CM | POA: Diagnosis not present

## 2022-06-05 DIAGNOSIS — Z7982 Long term (current) use of aspirin: Secondary | ICD-10-CM | POA: Diagnosis not present

## 2022-06-05 DIAGNOSIS — Z7984 Long term (current) use of oral hypoglycemic drugs: Secondary | ICD-10-CM | POA: Diagnosis not present

## 2022-06-08 ENCOUNTER — Ambulatory Visit: Payer: Medicaid Other | Attending: Family | Admitting: Family

## 2022-06-08 ENCOUNTER — Encounter: Payer: Self-pay | Admitting: Family

## 2022-06-08 VITALS — BP 121/68 | HR 80 | Wt 177.4 lb

## 2022-06-08 DIAGNOSIS — E119 Type 2 diabetes mellitus without complications: Secondary | ICD-10-CM | POA: Insufficient documentation

## 2022-06-08 DIAGNOSIS — F419 Anxiety disorder, unspecified: Secondary | ICD-10-CM | POA: Diagnosis not present

## 2022-06-08 DIAGNOSIS — Z9049 Acquired absence of other specified parts of digestive tract: Secondary | ICD-10-CM | POA: Insufficient documentation

## 2022-06-08 DIAGNOSIS — Z85828 Personal history of other malignant neoplasm of skin: Secondary | ICD-10-CM | POA: Diagnosis not present

## 2022-06-08 DIAGNOSIS — Z8616 Personal history of COVID-19: Secondary | ICD-10-CM | POA: Insufficient documentation

## 2022-06-08 DIAGNOSIS — R109 Unspecified abdominal pain: Secondary | ICD-10-CM | POA: Insufficient documentation

## 2022-06-08 DIAGNOSIS — I10 Essential (primary) hypertension: Secondary | ICD-10-CM

## 2022-06-08 DIAGNOSIS — I110 Hypertensive heart disease with heart failure: Secondary | ICD-10-CM | POA: Insufficient documentation

## 2022-06-08 DIAGNOSIS — Z7984 Long term (current) use of oral hypoglycemic drugs: Secondary | ICD-10-CM | POA: Diagnosis not present

## 2022-06-08 DIAGNOSIS — F411 Generalized anxiety disorder: Secondary | ICD-10-CM

## 2022-06-08 DIAGNOSIS — R200 Anesthesia of skin: Secondary | ICD-10-CM | POA: Diagnosis not present

## 2022-06-08 DIAGNOSIS — I5032 Chronic diastolic (congestive) heart failure: Secondary | ICD-10-CM | POA: Diagnosis not present

## 2022-06-08 DIAGNOSIS — R0602 Shortness of breath: Secondary | ICD-10-CM | POA: Insufficient documentation

## 2022-06-08 DIAGNOSIS — F32A Depression, unspecified: Secondary | ICD-10-CM | POA: Insufficient documentation

## 2022-06-08 DIAGNOSIS — Z87440 Personal history of urinary (tract) infections: Secondary | ICD-10-CM | POA: Insufficient documentation

## 2022-06-08 DIAGNOSIS — Z950 Presence of cardiac pacemaker: Secondary | ICD-10-CM | POA: Insufficient documentation

## 2022-06-08 DIAGNOSIS — Z8719 Personal history of other diseases of the digestive system: Secondary | ICD-10-CM | POA: Diagnosis not present

## 2022-06-08 DIAGNOSIS — E785 Hyperlipidemia, unspecified: Secondary | ICD-10-CM | POA: Diagnosis not present

## 2022-06-08 DIAGNOSIS — G4733 Obstructive sleep apnea (adult) (pediatric): Secondary | ICD-10-CM | POA: Diagnosis not present

## 2022-06-08 DIAGNOSIS — I480 Paroxysmal atrial fibrillation: Secondary | ICD-10-CM | POA: Insufficient documentation

## 2022-06-08 NOTE — Progress Notes (Signed)
Patient ID: Julia Stark, female    DOB: [birth year], 62 y.o.   MRN: [id number]  HPI  Julia Stark is a 62 y/o female with a history of DM, hyperlipidemia, HTN, anxiety, PAF, depression and chronic heart failure.   Echo 01/12/22 showed an EF of 50% along with severe LAE/ RAE and moderate TR.   Stress test 06/28/20: Normal treadmill EKG without evidence of ischemia or arrhythmia. Normal myocardial perfusion without evidence of myocardial ischemia   cMRI 05/20/19: The left ventricle is normal in cavity size and wall thickness. Global systolic function is normal. The LV ejection fraction is 61%. There are no regional wall motion abnormalities. The right ventricle is normal in cavity size, wall thickness, and systolic function. There is mild bilateral atrial dilatation. The aortic valve is trileaflet in morphology. There is no significant aortic valve stenosis or  regurgitation. There is no significant valvular disease. Delayed enhancement imaging demonstrates no evidence of myocardial infarction, scar or infiltrative disease.   Admitted 01/09/22 due to L VATs decortication 01/13/22. Diuresed. Antibiotics given for UTI. Admitted 12/13/21 due to Convergent Procedure w/ CTS and EP teams on 12/14/21. Complicated by ileus. PRBC's given. Antibiotics given for UTI.    She presents today for a HF follow-up visit with a chief complaint of moderate fatigue with minimal exertion. Describes this as chronic in nature. Has associated SOB, pedal edema, decreased appetite, abd pain, numbness around rib cage, anxiety and difficulty sleeping along with this. Denies cough, chest pain, palpitations, abdominal distention, dizziness or weight gain.   Had ablation along with pacemaker implantation 05/17/22 and reports frustration that she still doesn't feel any better. Taking her torsemide PRN and last took it ~ 2 weeks ago.   Past Medical History:  Diagnosis Date   Anxiety    Arthritis    bilateral shoulders   Atrial  fibrillation (HCC)    Cancer (HCC)    BASAL CELL CARCINOMA OF FACE   CHF (congestive heart failure) ([place])    Colon polyp    COVID-19 11/26/2020   Depression    Diabetes mellitus without complication ([place])    Dysrhythmia    A fib   Hyperlipidemia    Hypertension    Sialoadenitis 12/24/2018   Past Surgical History:  Procedure Laterality Date   BASAL CELL CARCINOMA EXCISION     CARDIAC ELECTROPHYSIOLOGY MAPPING AND ABLATION  05/2019   CARDIOVERSION N/A 03/04/2019   Procedure: CARDIOVERSION;  Surgeon: Corey Skains, MD;  Location: [facility];  Service: Cardiovascular;  Laterality: N/A;   CARDIOVERSION N/A 06/11/2019   Procedure: CARDIOVERSION;  Surgeon: Corey Skains, MD;  Location: [place] [facility];  Service: Cardiovascular;  Laterality: N/A;   CARDIOVERSION N/A 05/25/2020   Procedure: CARDIOVERSION;  Surgeon: Corey Skains, MD;  Location: [place] [facility];  Service: Cardiovascular;  Laterality: N/A;   CARDIOVERSION N/A 11/25/2020   Procedure: CARDIOVERSION;  Surgeon: Corey Skains, MD;  Location: [facility];  Service: Cardiovascular;  Laterality: N/A;   [place]     COLONOSCOPY  02/12/2012   COLONOSCOPY WITH PROPOFOL N/A 04/09/2017   Procedure: COLONOSCOPY WITH PROPOFOL;  Surgeon: Manya Silvas, MD;  Location: [facility];  Service: Endoscopy;  Laterality: N/A;   ELECTROPHYSIOLOGIC STUDY N/A 05/12/2015   Procedure: Cardioversion;  Surgeon: Corey Skains, MD;  Location: [facility];  Service: Cardiovascular;  Laterality: N/A;   LUNG SURGERY Left 01/13/2022   THORACOSCOPY  12/14/2021   [facility] - THORACOSCOPIC ANY METH  ENDOSCOPY,  SURGICAL; OPERATIVE TISSUE ABLATION AND RECONSTRUCTION OF  ATRIA, LIMITED (EG, MODIFIED MAZE PROCEDURE), WITHOUT CARDIOPULMONARY   THORACOSCOPY  01/13/2022   [facility] : THORACOSCOPY, SURGICAL; WITH TOTAL PULMONARY DECORTICATION, INCLUDING INTRAPLEURAL PNEUMONOLYSIS   TUBAL LIGATION  1999   Family History  Problem Relation Age  of Onset   Diabetes Mother    Hypertension Mother    Skin cancer Mother    Depression Mother    Alzheimer's disease Father    Diabetes Sister    Depression Sister    Diabetes Brother    Colon cancer Maternal Uncle    Hypertension Maternal Grandfather    Hyperlipidemia Maternal Grandfather    Diabetes Paternal Grandmother    Heart disease Paternal Grandmother    Prostate cancer Paternal Grandfather    Ovarian cancer Cousin    Breast cancer Neg Hx    Cervical cancer Neg Hx    Social History   Tobacco Use   Smoking status: Never   Smokeless tobacco: Never  Substance Use Topics   Alcohol use: Yes    Alcohol/week: 4.0 standard drinks of alcohol    Types: 2 Glasses of wine, 2 Cans of beer per week   Allergies  Allergen Reactions   Amiodarone Other (See Comments)    Other Reaction(s): Other (See Comments), Vomiting  Blurred vision   Flecainide Nausea And Vomiting    Hallucination   Trazodone Other (See Comments)    headaches   Rosuvastatin Other (See Comments)    muscle cramps muscle cramps can tolerate low dose    Prior to Admission medications   Medication Sig Start Date End Date Taking? Authorizing Provider  apixaban (ELIQUIS) 5 MG TABS tablet Take 5 mg by mouth 2 (two) times daily.   Yes [provider]  aspirin EC 81 MG tablet Take 81 mg by mouth daily. Swallow whole.   Yes [provider]  Iron, Ferrous Sulfate, 325 (65 Fe) MG TABS Take 325 mg by mouth daily. 03/16/22  Yes Bacigalupo, Dionne Bucy, MD  linaclotide [facility]) 145 MCG CAPS capsule Take 1 capsule (145 mcg total) by mouth daily before breakfast. Patient taking differently: Take 145 mcg by mouth daily as needed (constipation). 02/27/22  Yes Bacigalupo, Dionne Bucy, MD  Magnesium 400 MG CAPS Take 1 capsule by mouth daily at 12 noon.   Yes [provider]  metFORMIN (GLUCOPHAGE) 1000 MG tablet Take 1 tablet by mouth once daily 09/19/21  Yes Bacigalupo, Dionne Bucy, MD  omeprazole (PRILOSEC  OTC) 20 MG tablet Take 20 mg by mouth in the morning and at bedtime.   Yes [provider]  potassium chloride SA (KLOR-CON M) 20 MEQ tablet Take 1 tablet (20 mEq total) by mouth daily. 05/10/22  Yes Jovanie Verge, Otila Kluver A, FNP  rosuvastatin (CRESTOR) 5 MG tablet Take by mouth. 07/18/21  Yes [provider]  Torsemide 40 MG TABS Take 40 mg by mouth as needed. 01/25/22  Yes [provider]  venlafaxine XR (EFFEXOR-XR) 150 MG 24 hr capsule TAKE 1 CAPSULE BY MOUTH ONCE DAILY WITH BREAKFAST 03/16/22  Yes Bacigalupo, Dionne Bucy, MD  zolpidem (AMBIEN) 5 MG tablet Take 1 tablet (5 mg total) by mouth at bedtime as needed. for sleep Patient taking differently: Take 5 mg by mouth at bedtime. for sleep 02/27/22  Yes Bacigalupo, Dionne Bucy, MD  DILT-XR 180 MG 24 hr capsule Take 180 mg by mouth every 12 (twelve) hours. Patient not taking: Reported on 06/08/2022    [provider]  diltiazem (CARDIZEM)  60 MG tablet Take 60 mg by mouth daily. Take 1 tablet (60 mg total) by mouth once daily around 3 pm. Patient not taking: Reported on 06/08/2022 05/02/22 05/02/23  [provider]  fluconazole (DIFLUCAN) 100 MG tablet Take 200 mg( Two Tablets) x 1 day and 100 mg (One Tablet) x 13 days Patient taking differently: Take 100 mg by mouth daily. 05/03/22   Pyrtle, Lajuan Lines, MD    Review of Systems  Constitutional:  Positive for appetite change (decreased) and fatigue (easily).  HENT:  Positive for trouble swallowing (at times food feels stuck). Negative for congestion, postnasal drip and sore throat.   Eyes: Negative.   Respiratory:  Positive for shortness of breath (easily). Negative for chest tightness.   Cardiovascular:  Positive for leg swelling (at times). Negative for chest pain and palpitations.  Gastrointestinal:  Positive for abdominal pain (LUQ and up the side) and constipation. Negative for abdominal distention.  Endocrine: Negative.   Genitourinary: Negative.   Musculoskeletal:  Negative.   Skin: Negative.   Allergic/Immunologic: Negative.   Neurological:  Positive for numbness (under left rib cage/ upper abdominal area). Negative for dizziness and light-headedness.  Hematological:  Negative for adenopathy. Does not bruise/bleed easily.  Psychiatric/Behavioral:  Positive for sleep disturbance (sleeping with O2 at 2.5L; sleep on 6 pillows). Negative for dysphoric mood. The patient is nervous/anxious.    Vitals:   06/08/22 1405  BP: 121/68  Pulse: 80  SpO2: 95%  Weight: 177 lb 6.4 oz (80.5 kg)   Wt Readings from Last 3 Encounters:  06/08/22 177 lb 6.4 oz (80.5 kg)  05/10/22 176 lb 6 oz (80 kg)  05/02/22 185 lb (83.9 kg)   Lab Results  Component Value Date   CREATININE 0.48 05/02/2022   CREATININE 0.7 02/14/2022   CREATININE 0.61 07/15/2021   Physical Exam Vitals and nursing note reviewed.  Constitutional:      Appearance: [name] well-developed.  HENT:     Head: Normocephalic and atraumatic.  Cardiovascular:     Rate and Rhythm: Normal rate. Rhythm irregular.  Pulmonary:     Effort: Pulmonary effort is normal.     Breath sounds: No wheezing, rhonchi or rales.  Abdominal:     General: There is no distension.     Palpations: Abdomen is soft.  Musculoskeletal:     Cervical back: Normal range of motion and neck supple.     Right lower leg: No tenderness. Edema (trace pitting) present.     Left lower leg: No tenderness. Edema (trace pitting) present.  Skin:    General: Skin is warm and dry.  Neurological:     General: No focal deficit present.     Mental Status: [name] alert and oriented to person, place, and time.  Psychiatric:        Mood and Affect: Mood is anxious.        Behavior: Behavior normal.    Assessment & Plan:  1: Chronic heart failure with preserved ejection fraction with LAE- - NYHA class III - euvolemic - weighing daily; reminded to call for an overnight weight gain of > 2 pounds or weekly weight gain of > 5 pounds - weight  stable from last visit here 1 month ago - echo 01/12/22: EF of 50% along with severe LAE/ RAE and moderate TR.  - cMRI 05/20/19: The left ventricle is normal in cavity size and wall thickness. Global systolic function is normal. The LV ejection fraction is 61%. There are no  regional wall motion abnormalities. The right ventricle is normal in cavity size, wall thickness, and systolic function. There is mild bilateral atrial dilatation. The aortic valve is trileaflet in morphology. There is no significant aortic valve stenosis or  regurgitation. There is no significant valvular disease. Delayed enhancement imaging demonstrates no evidence of myocardial infarction, scar or infiltrative disease.  - not adding salt and has been reading food labels for sodium content - drinking ~ 1 gallon of water daily and understands that it too much fluids - tries to be active but difficult with her fatigue/ SOB - torsemide '40mg'$  PRN; last took it ~ 2 weeks ago - history of UTI's so not a candidate for SGLT2 - saw cardiology Clayborn Bigness) 05/02/22  2: HTN- - BP 121/68 - saw PCP Brita Romp) 02/27/22 - BMP 05/17/22 showed sodium 135, potassium 3.9, creatinine 0.6 & GFR 102  3: PAF- - saw cardiothoracic surgeon ([name]) 06/05/22   - multiple previous ablations/DCCV  - s/p convergent procedure 12/14/21  - s/p DC PPM with LB area lead and AV node ablation 05/17/22 - eliquis '5mg'$  BID - discussed referral to [facility] group for further evaluation & she agrees as she wants to feel better; appt scheduled 07/12/22  4: DM- - A1c 01/09/22 was 5.6% - metformin '1000mg'$  BID - crestor '5mg'$    5: Anxiety-  - effexor-XR '150mg'$  daily  6: OSA- - sleep study referral placed to f/u sleep apnea   Medication list reviewed.   Return in 1 month, sooner if needed.

## 2022-06-09 ENCOUNTER — Encounter: Payer: Self-pay | Admitting: Family

## 2022-06-12 DIAGNOSIS — Z79899 Other long term (current) drug therapy: Secondary | ICD-10-CM | POA: Diagnosis not present

## 2022-06-12 DIAGNOSIS — I272 Pulmonary hypertension, unspecified: Secondary | ICD-10-CM | POA: Diagnosis not present

## 2022-06-12 DIAGNOSIS — R0781 Pleurodynia: Secondary | ICD-10-CM | POA: Diagnosis not present

## 2022-06-12 DIAGNOSIS — Z87891 Personal history of nicotine dependence: Secondary | ICD-10-CM | POA: Diagnosis not present

## 2022-06-12 DIAGNOSIS — E119 Type 2 diabetes mellitus without complications: Secondary | ICD-10-CM | POA: Diagnosis not present

## 2022-06-12 DIAGNOSIS — R600 Localized edema: Secondary | ICD-10-CM | POA: Diagnosis not present

## 2022-06-12 DIAGNOSIS — Z5181 Encounter for therapeutic drug level monitoring: Secondary | ICD-10-CM | POA: Diagnosis not present

## 2022-06-12 DIAGNOSIS — Z7901 Long term (current) use of anticoagulants: Secondary | ICD-10-CM | POA: Diagnosis not present

## 2022-06-12 DIAGNOSIS — I517 Cardiomegaly: Secondary | ICD-10-CM | POA: Diagnosis not present

## 2022-06-12 DIAGNOSIS — I4719 Other supraventricular tachycardia: Secondary | ICD-10-CM | POA: Diagnosis not present

## 2022-06-12 DIAGNOSIS — J90 Pleural effusion, not elsewhere classified: Secondary | ICD-10-CM | POA: Diagnosis not present

## 2022-06-12 DIAGNOSIS — R079 Chest pain, unspecified: Secondary | ICD-10-CM | POA: Diagnosis not present

## 2022-06-12 DIAGNOSIS — I4819 Other persistent atrial fibrillation: Secondary | ICD-10-CM | POA: Diagnosis not present

## 2022-06-12 DIAGNOSIS — R9431 Abnormal electrocardiogram [ECG] [EKG]: Secondary | ICD-10-CM | POA: Diagnosis not present

## 2022-06-12 DIAGNOSIS — I5032 Chronic diastolic (congestive) heart failure: Secondary | ICD-10-CM | POA: Diagnosis not present

## 2022-06-13 ENCOUNTER — Encounter: Payer: Self-pay | Admitting: Family

## 2022-06-14 ENCOUNTER — Telehealth: Payer: Self-pay

## 2022-06-14 ENCOUNTER — Other Ambulatory Visit: Payer: Self-pay | Admitting: Family

## 2022-06-14 ENCOUNTER — Ambulatory Visit
Admission: RE | Admit: 2022-06-14 | Discharge: 2022-06-14 | Disposition: A | Discharge status: Home / Self Care | Payer: Medicaid Other | Source: Ambulatory Visit | Attending: Family | Admitting: Family

## 2022-06-14 DIAGNOSIS — I5033 Acute on chronic diastolic (congestive) heart failure: Secondary | ICD-10-CM

## 2022-06-14 LAB — BASIC METABOLIC PANEL
Anion gap: 10 (ref 5–15)
BUN: 15 mg/dL (ref 8–23)
CO2: 28 mmol/L (ref 22–32)
Calcium: 9.4 mg/dL (ref 8.9–10.3)
Chloride: 99 mmol/L (ref 98–111)
Creatinine, Ser: 0.55 mg/dL (ref 0.44–1.00)
GFR, Estimated: >60 mL/min (ref >60)
Glucose, Bld: 109 mg/dL — ABNORMAL HIGH (ref 70–99)
Potassium: 4.1 mmol/L (ref 3.5–5.1)
Sodium: 137 mmol/L (ref 135–145)

## 2022-06-14 LAB — BRAIN NATRIURETIC PEPTIDE: B Natriuretic Peptide: 299.4 pg/mL — ABNORMAL HIGH (ref 0.0–100.0)

## 2022-06-14 MED ORDER — FUROSEMIDE 10 MG/ML IJ SOLN
INTRAMUSCULAR | Status: AC
  Administered 2022-06-14: 80 mg via INTRAVENOUS
  Filled 2022-06-14: qty 8

## 2022-06-14 MED ORDER — FUROSEMIDE 10 MG/ML IJ SOLN
INTRAMUSCULAR | Status: AC
  Filled 2022-06-14: qty 8

## 2022-06-14 MED ORDER — POTASSIUM CHLORIDE CRYS ER 20 MEQ PO TBCR
EXTENDED_RELEASE_TABLET | ORAL | Status: AC
  Administered 2022-06-14: 40 meq via ORAL
  Filled 2022-06-14: qty 2

## 2022-06-14 MED ORDER — FUROSEMIDE 10 MG/ML IJ SOLN: 80.0000 mg | Freq: Once | INTRAMUSCULAR | Status: AC

## 2022-06-14 MED ORDER — POTASSIUM CHLORIDE CRYS ER 20 MEQ PO TBCR: 40.0000 meq | EXTENDED_RELEASE_TABLET | Freq: Once | ORAL | Status: AC

## 2022-06-14 NOTE — Telephone Encounter (Signed)
Patient scheduled for IV lasix outpatient today at 1 PM.  Patient instructed to come to [facility] entrance and check in at the registration desk. Follow up appointment scheduled with Darylene Price, NP, for tomorrow. Pt aware, agreeable, and verbalized understanding

## 2022-06-15 ENCOUNTER — Ambulatory Visit: Payer: Medicaid Other | Attending: Family | Admitting: Family

## 2022-06-15 VITALS — BP 122/60 | HR 80 | Wt 167.4 lb

## 2022-06-15 DIAGNOSIS — E785 Hyperlipidemia, unspecified: Secondary | ICD-10-CM | POA: Diagnosis not present

## 2022-06-15 DIAGNOSIS — E119 Type 2 diabetes mellitus without complications: Secondary | ICD-10-CM | POA: Diagnosis not present

## 2022-06-15 DIAGNOSIS — Z7984 Long term (current) use of oral hypoglycemic drugs: Secondary | ICD-10-CM | POA: Diagnosis not present

## 2022-06-15 DIAGNOSIS — Z8616 Personal history of COVID-19: Secondary | ICD-10-CM | POA: Insufficient documentation

## 2022-06-15 DIAGNOSIS — F411 Generalized anxiety disorder: Secondary | ICD-10-CM | POA: Diagnosis not present

## 2022-06-15 DIAGNOSIS — F419 Anxiety disorder, unspecified: Secondary | ICD-10-CM | POA: Insufficient documentation

## 2022-06-15 DIAGNOSIS — I10 Essential (primary) hypertension: Secondary | ICD-10-CM

## 2022-06-15 DIAGNOSIS — Z79899 Other long term (current) drug therapy: Secondary | ICD-10-CM | POA: Insufficient documentation

## 2022-06-15 DIAGNOSIS — G4733 Obstructive sleep apnea (adult) (pediatric): Secondary | ICD-10-CM | POA: Insufficient documentation

## 2022-06-15 DIAGNOSIS — Z8249 Family history of ischemic heart disease and other diseases of the circulatory system: Secondary | ICD-10-CM | POA: Insufficient documentation

## 2022-06-15 DIAGNOSIS — Z950 Presence of cardiac pacemaker: Secondary | ICD-10-CM | POA: Diagnosis not present

## 2022-06-15 DIAGNOSIS — Z87440 Personal history of urinary (tract) infections: Secondary | ICD-10-CM | POA: Insufficient documentation

## 2022-06-15 DIAGNOSIS — I480 Paroxysmal atrial fibrillation: Secondary | ICD-10-CM | POA: Diagnosis not present

## 2022-06-15 DIAGNOSIS — I110 Hypertensive heart disease with heart failure: Secondary | ICD-10-CM | POA: Insufficient documentation

## 2022-06-15 DIAGNOSIS — Z833 Family history of diabetes mellitus: Secondary | ICD-10-CM | POA: Diagnosis not present

## 2022-06-15 DIAGNOSIS — I5032 Chronic diastolic (congestive) heart failure: Secondary | ICD-10-CM | POA: Insufficient documentation

## 2022-06-15 NOTE — Progress Notes (Signed)
Patient ID: Julia Stark, female    DOB: 19-Dec-1960, 62 y.o.   MRN: [id number]  HPI  Ms Thurner is a 62 y/o female with a history of DM, hyperlipidemia, HTN, anxiety, PAF, depression and chronic heart failure.   Echo 01/12/22 showed an EF of 50% along with severe LAE/ RAE and moderate TR.   Stress test 06/28/20: Normal treadmill EKG without evidence of ischemia or arrhythmia. Normal myocardial perfusion without evidence of myocardial ischemia   cMRI 05/20/19: The left ventricle is normal in cavity size and wall thickness. Global systolic function is normal. The LV ejection fraction is 61%. There are no regional wall motion abnormalities. The right ventricle is normal in cavity size, wall thickness, and systolic function. There is mild bilateral atrial dilatation. The aortic valve is trileaflet in morphology. There is no significant aortic valve stenosis or  regurgitation. There is no significant valvular disease. Delayed enhancement imaging demonstrates no evidence of myocardial infarction, scar or infiltrative disease.   Was in the ED 06/12/22 d/t chest pain and SOB. Admitted 01/09/22 due to L VATs decortication 01/13/22. Diuresed. Antibiotics given for UTI. Admitted 12/13/21 due to Convergent Procedure w/ CTS and EP teams on 12/14/21. Complicated by ileus. PRBC's given. Antibiotics given for UTI.    She presents today for a HF follow-up visit with a chief complaint of moderate SOB with little exertion. Has associated fatigue, decreased appetite, pedal edema (at times), light-headedness, numbness/ pain under left rib cage area where her surgery was performed, anxiety and difficulty sleeping along with this. Denies abdominal distention, palpitations, chest pain, cough or weight gain  Now taking colchicine since her recent ED visit and says that the pain has improved some. Received IV lasix yesterday. Now taking her torsemide daily and says that she feels a little bit better.   Had ablation along with  pacemaker implantation 05/17/22 and reports frustration that she still doesn't feel any better.   Past Medical History:  Diagnosis Date   Anxiety    Arthritis    bilateral shoulders   Atrial fibrillation (HCC)    Cancer (HCC)    BASAL CELL CARCINOMA OF FACE   CHF (congestive heart failure) ([place])    Colon polyp    COVID-19 11/26/2020   Depression    Diabetes mellitus without complication ([place])    Dysrhythmia    A fib   Hyperlipidemia    Hypertension    Sialoadenitis 12/24/2018   Past Surgical History:  Procedure Laterality Date   BASAL CELL CARCINOMA EXCISION     CARDIAC ELECTROPHYSIOLOGY MAPPING AND ABLATION  05/2019   CARDIOVERSION N/A 03/04/2019   Procedure: CARDIOVERSION;  Surgeon: Corey Skains, MD;  Location: [facility] ORS;  Service: Cardiovascular;  Laterality: N/A;   CARDIOVERSION N/A 06/11/2019   Procedure: CARDIOVERSION;  Surgeon: Corey Skains, MD;  Location: [place] [facility];  Service: Cardiovascular;  Laterality: N/A;   CARDIOVERSION N/A 05/25/2020   Procedure: CARDIOVERSION;  Surgeon: Corey Skains, MD;  Location: [place] [facility];  Service: Cardiovascular;  Laterality: N/A;   CARDIOVERSION N/A 11/25/2020   Procedure: CARDIOVERSION;  Surgeon: Corey Skains, MD;  Location: [facility];  Service: Cardiovascular;  Laterality: N/A;   [place]     COLONOSCOPY  02/12/2012   COLONOSCOPY WITH PROPOFOL N/A 04/09/2017   Procedure: COLONOSCOPY WITH PROPOFOL;  Surgeon: Manya Silvas, MD;  Location: [facility] ENDOSCOPY;  Service: Endoscopy;  Laterality: N/A;   ELECTROPHYSIOLOGIC STUDY N/A 05/12/2015   Procedure: Cardioversion;  Surgeon: Corey Skains, MD;  Location: [facility] ORS;  Service: Cardiovascular;  Laterality: N/A;   LUNG SURGERY Left 01/13/2022   THORACOSCOPY  12/14/2021   [facility] - THORACOSCOPIC ANY METH  ENDOSCOPY, SURGICAL; OPERATIVE TISSUE ABLATION AND RECONSTRUCTION OF  ATRIA, LIMITED (EG, MODIFIED MAZE PROCEDURE), WITHOUT CARDIOPULMONARY    THORACOSCOPY  01/13/2022   [facility] : THORACOSCOPY, SURGICAL; WITH TOTAL PULMONARY DECORTICATION, INCLUDING INTRAPLEURAL PNEUMONOLYSIS   TUBAL LIGATION  1999   Family History  Problem Relation Age of Onset   Diabetes Mother    Hypertension Mother    Skin cancer Mother    Depression Mother    Alzheimer's disease Father    Diabetes Sister    Depression Sister    Diabetes Brother    Colon cancer Maternal Uncle    Hypertension Maternal Grandfather    Hyperlipidemia Maternal Grandfather    Diabetes Paternal Grandmother    Heart disease Paternal Grandmother    Prostate cancer Paternal Grandfather    Ovarian cancer Cousin    Breast cancer Neg Hx    Cervical cancer Neg Hx    Social History   Tobacco Use   Smoking status: Never   Smokeless tobacco: Never  Substance Use Topics   Alcohol use: Yes    Alcohol/week: 4.0 standard drinks of alcohol    Types: 2 Glasses of wine, 2 Cans of beer per week   Allergies  Allergen Reactions   Amiodarone Other (See Comments)    Other Reaction(s): Other (See Comments), Vomiting  Blurred vision   Flecainide Nausea And Vomiting    Hallucination   Trazodone Other (See Comments)    headaches   Prior to Admission medications   Medication Sig Start Date End Date Taking? Authorizing Provider  apixaban (ELIQUIS) 5 MG TABS tablet Take 5 mg by mouth 2 (two) times daily.   Yes [provider]  aspirin EC 81 MG tablet Take 81 mg by mouth daily. Swallow whole.   Yes [provider]  colchicine 0.6 MG tablet Take 0.6 mg by mouth daily.   Yes [provider]  Iron, Ferrous Sulfate, 325 (65 Fe) MG TABS Take 325 mg by mouth daily. 03/16/22  Yes Bacigalupo, Dionne Bucy, MD  linaclotide [facility]) 145 MCG CAPS capsule Take 1 capsule (145 mcg total) by mouth daily before breakfast. Patient taking differently: Take 145 mcg by mouth daily as needed (constipation). 02/27/22  Yes Bacigalupo, Dionne Bucy, MD  Magnesium 400 MG CAPS Take 1 capsule by  mouth daily at 12 noon.   Yes [provider]  metFORMIN (GLUCOPHAGE) 1000 MG tablet Take 1 tablet by mouth once daily 09/19/21  Yes Bacigalupo, Dionne Bucy, MD  omeprazole (PRILOSEC OTC) 20 MG tablet Take 20 mg by mouth in the morning and at bedtime.   Yes [provider]  potassium chloride SA (KLOR-CON M) 20 MEQ tablet Take 1 tablet (20 mEq total) by mouth daily. 05/10/22  Yes Liller Yohn, Otila Kluver A, FNP  rosuvastatin (CRESTOR) 5 MG tablet Take by mouth. 07/18/21  Yes [provider]  Torsemide 40 MG TABS Take 40 mg by mouth as needed. 01/25/22  Yes [provider]  venlafaxine XR (EFFEXOR-XR) 150 MG 24 hr capsule TAKE 1 CAPSULE BY MOUTH ONCE DAILY WITH BREAKFAST 03/16/22  Yes Bacigalupo, Dionne Bucy, MD  zolpidem (AMBIEN) 5 MG tablet Take 1 tablet (5 mg total) by mouth at bedtime as needed. for sleep Patient taking differently: Take 5 mg by mouth at bedtime. for sleep 02/27/22  Yes Bacigalupo,  Dionne Bucy, MD    Review of Systems  Constitutional:  Positive for appetite change (decreased) and fatigue (easily).  HENT:  Positive for trouble swallowing (at times food feels stuck). Negative for congestion, postnasal drip and sore throat.   Eyes: Negative.   Respiratory:  Positive for shortness of breath (easily). Negative for cough and chest tightness.   Cardiovascular:  Positive for leg swelling (at times). Negative for chest pain and palpitations.  Gastrointestinal:  Positive for abdominal pain (LUQ and up the side). Negative for abdominal distention and constipation.  Endocrine: Negative.   Genitourinary: Negative.   Musculoskeletal: Negative.   Skin: Negative.   Allergic/Immunologic: Negative.   Neurological:  Positive for light-headedness and numbness (under left rib cage/ upper abdominal area). Negative for dizziness.  Hematological:  Negative for adenopathy. Does not bruise/bleed easily.  Psychiatric/Behavioral:  Positive for sleep disturbance (sleeping with O2 at 2.5L;  sleep on 6 pillows). Negative for dysphoric mood. The patient is nervous/anxious.    Vitals:   06/15/22 1529  BP: 122/60  Pulse: 80  SpO2: 100%  Weight: 167 lb 6 oz (75.9 kg)   Wt Readings from Last 3 Encounters:  06/15/22 167 lb 6 oz (75.9 kg)  06/08/22 177 lb 6.4 oz (80.5 kg)  05/10/22 176 lb 6 oz (80 kg)   Lab Results  Component Value Date   CREATININE 0.55 06/14/2022   CREATININE 0.48 05/02/2022   CREATININE 0.7 02/14/2022   Physical Exam Vitals and nursing note reviewed.  Constitutional:      Appearance: [name] well-developed.  HENT:     Head: Normocephalic and atraumatic.  Cardiovascular:     Rate and Rhythm: Normal rate. Rhythm irregular.  Pulmonary:     Effort: Pulmonary effort is normal.     Breath sounds: No wheezing, rhonchi or rales.  Abdominal:     General: There is no distension.     Palpations: Abdomen is soft.  Musculoskeletal:     Cervical back: Normal range of motion and neck supple.     Right lower leg: No tenderness. Edema (trace pitting) present.     Left lower leg: No tenderness. Edema (trace pitting) present.  Skin:    General: Skin is warm and dry.  Neurological:     General: No focal deficit present.     Mental Status: [name] alert and oriented to person, place, and time.  Psychiatric:        Mood and Affect: Mood is anxious.        Behavior: Behavior normal.    Assessment & Plan:  1: Chronic heart failure with preserved ejection fraction with LAE- - NYHA class III - euvolemic - weighing daily; reminded to call for an overnight weight gain of > 2 pounds or weekly weight gain of > 5 pounds - weight down 10 pounds from last visit here 1 week ago - echo 01/12/22: EF of 50% along with severe LAE/ RAE and moderate TR.  - cMRI 05/20/19: The left ventricle is normal in cavity size and wall thickness. Global systolic function is normal. The LV ejection fraction is 61%. There are no regional wall motion abnormalities. The right ventricle is  normal in cavity size, wall thickness, and systolic function. There is mild bilateral atrial dilatation. The aortic valve is trileaflet in morphology. There is no significant aortic valve stenosis or  regurgitation. There is no significant valvular disease. Delayed enhancement imaging demonstrates no evidence of myocardial infarction, scar or infiltrative disease.  - not adding  salt and has been reading food labels for sodium content - tries to be active but difficult with her fatigue/ SOB - torsemide 40mg  daily - history of UTI's so not a candidate for SGLT2 - seeing [facility] provider Aundra Dubin) 07/12/22 - BNP 06/14/22 was 299.4  2: HTN- - BP 122/60 - saw PCP Brita Romp) 02/27/22 - BMP 06/14/22 showed sodium 137, potassium 4.1, creatinine 0.55 & GFR >60  3: PAF- - saw cardiothoracic surgeon ([name]) 06/05/22   - multiple previous ablations/DCCV  - s/p convergent procedure 12/14/21  - s/p DC PPM with LB area lead and AV node ablation 05/17/22 - eliquis 5mg  BID - saw cardiology Clayborn Bigness) 05/02/22  4: DM- - A1c 01/09/22 was 5.6% - metformin 1000mg  BID - crestor 5mg    5: Anxiety-  - effexor XR 150mg  daily  6: OSA- - sleep study referral was placed to f/u sleep apnea  Return in 1 month, sooner if needed

## 2022-06-16 ENCOUNTER — Encounter: Payer: Self-pay | Admitting: Family

## 2022-06-16 DIAGNOSIS — I471 Supraventricular tachycardia, unspecified: Secondary | ICD-10-CM | POA: Diagnosis not present

## 2022-06-16 DIAGNOSIS — G4734 Idiopathic sleep related nonobstructive alveolar hypoventilation: Secondary | ICD-10-CM | POA: Diagnosis not present

## 2022-06-20 ENCOUNTER — Encounter: Payer: Self-pay | Admitting: Family

## 2022-06-22 ENCOUNTER — Encounter: Payer: Self-pay | Admitting: *Deleted

## 2022-06-22 ENCOUNTER — Ambulatory Visit: Payer: Medicaid Other | Admitting: Physician Assistant

## 2022-06-22 VITALS — BP 107/68 | HR 80 | Temp 97.7°F | Ht 68.0 in | Wt 169.0 lb

## 2022-06-22 DIAGNOSIS — H1131 Conjunctival hemorrhage, right eye: Secondary | ICD-10-CM

## 2022-06-22 NOTE — Progress Notes (Signed)
Acute Office Visit   Patient: Julia Stark   DOB: [birth year]   62 y.o. Female  MRN: [id number] Visit Date: 06/22/2022  Today's healthcare provider: Dani Gobble Agustus Mane, PA-C  Introduced myself to the patient as a Journalist, newspaper and provided education on APPs in clinical practice.    Chief Complaint  Patient presents with   Eye Problem   Epistaxis    Patient says she has had a nosebleed all day and says she is currently taking blood thinners. Patient says she recently had a Pacemaker placed back in February. Patient says she just noticed the eye with her issues this afternoon. Patient says she is concerned.    Subjective    Eye Problem  Associated symptoms include eye redness.  Epistaxis   HPI     Epistaxis    Additional comments: Patient says she has had a nosebleed all day and says she is currently taking blood thinners. Patient says she recently had a Pacemaker placed back in February. Patient says she just noticed the eye with her issues this afternoon. Patient says she is concerned.       Last edited by Irena Reichmann, [place] on 06/22/2022  2:10 PM.       Eye Concern She reports today she noticed redness in her right eye She denies pain or trauma to the eye  She denies sneezing or elevations to blood pressure today   She states today she has had a few nosebleeds that are more intense than her normal  She has been on blood thinners for several years  She states she has been sticking tissue in her nostrils  She states she has been having to sleep with oxygen since her pacemaker was inserted and her nostrils have been very dry lately.  She has a referral to sleep med to see if she is able to dc her oxygen       Medications: Outpatient Medications Prior to Visit  Medication Sig   apixaban (ELIQUIS) 5 MG TABS tablet Take 5 mg by mouth 2 (two) times daily.   aspirin EC 81 MG tablet Take 81 mg by mouth daily. Swallow whole.   colchicine 0.6 MG tablet Take 0.6 mg by mouth  daily.   Iron, Ferrous Sulfate, 325 (65 Fe) MG TABS Take 325 mg by mouth daily.   linaclotide (LINZESS) 145 MCG CAPS capsule Take 1 capsule (145 mcg total) by mouth daily before breakfast. (Patient taking differently: Take 145 mcg by mouth daily as needed (constipation).)   Magnesium 400 MG CAPS Take 1 capsule by mouth daily at 12 noon.   Magnesium Oxide -Mg Supplement (TRUE MAGNESIUM OXIDE) 500 MG TABS Take by mouth.   metFORMIN (GLUCOPHAGE) 1000 MG tablet Take 1 tablet by mouth once daily   omeprazole (PRILOSEC OTC) 20 MG tablet Take 20 mg by mouth in the morning and at bedtime.   potassium chloride SA (KLOR-CON M) 20 MEQ tablet Take 1 tablet (20 mEq total) by mouth daily.   rosuvastatin (CRESTOR) 5 MG tablet Take by mouth.   Torsemide 40 MG TABS Take 40 mg by mouth daily.   venlafaxine XR (EFFEXOR-XR) 150 MG 24 hr capsule TAKE 1 CAPSULE BY MOUTH ONCE DAILY WITH BREAKFAST   zolpidem (AMBIEN) 5 MG tablet Take 1 tablet (5 mg total) by mouth at bedtime as needed. for sleep (Patient taking differently: Take 5 mg by mouth at bedtime. for sleep)   No facility-administered medications prior to  visit.    Review of Systems  HENT:  Positive for nosebleeds.   Eyes:  Positive for redness.       Objective    BP 107/68   Pulse 80   Temp 97.7 F (36.5 C) (Oral)   Ht 5\' 8"  (1.727 m)   Wt 169 lb (76.7 kg)   LMP 04/13/2011   SpO2 97%   BMI 25.70 kg/m    Physical Exam Vitals reviewed.  Constitutional:      General: [name] awake.     Appearance: Normal appearance. [name] well-developed and well-groomed.  HENT:     Head: Normocephalic and atraumatic.  Eyes:     General: Lids are normal. Gaze aligned appropriately.     Extraocular Movements: Extraocular movements intact.     Right eye: Normal extraocular motion and no nystagmus.     Left eye: Normal extraocular motion and no nystagmus.     Conjunctiva/sclera:     Right eye: Hemorrhage present.     Pupils: Pupils are equal, round, and  reactive to light.  Pulmonary:     Effort: Pulmonary effort is normal.     Breath sounds: Normal breath sounds.  Neurological:     General: No focal deficit present.     Mental Status: [name] alert and oriented to person, place, and time.  Psychiatric:        Mood and Affect: Mood normal.        Behavior: Behavior normal. Behavior is cooperative.        Thought Content: Thought content normal.        Judgment: Judgment normal.      No results found for any visits on 06/22/22.  Assessment & Plan      No follow-ups on file.      Problem List Items Addressed This Visit   None Visit Diagnoses     Subconjunctival bleed, right    -  Primary Acute, new concern. Patient presents today with concerns for eye redness that she has never experienced before. PE reveals subconjunctival hemorrhage of right eye along medial aspect.  No evidence of hyphema or ocular trauma.  EOM intact.  Patient is on chronic blood thinners.  Provided reassurance to patient that this is self-limited and will likely resolve in several days.  We discussed multiple potential causes as well as correlation with blood thinners as being potential contributing factors.  She denies trauma or injury to the eye area and denies vision changes.  Follow-up as needed for persistent or progressing symptoms.         No follow-ups on file.   I, Cledis Sohn E Stelios Kirby, PA-C, have reviewed all documentation for this visit. The documentation on 06/23/22 for the exam, diagnosis, procedures, and orders are all accurate and complete.   Talitha Givens, MHS, PA-C [facility]

## 2022-06-22 NOTE — Patient Instructions (Addendum)
  Try to reach out to your thoracic team to see if they can provide a humidifier nasal cannula while you are waiting for your sleep medicine apt.     It was nice to meet you and I appreciate the opportunity to be involved in your care If you were satisfied with the care you received from me, I would greatly appreciate you saying so in the after-visit survey that is sent out following our visit.

## 2022-07-04 ENCOUNTER — Encounter: Payer: Self-pay | Admitting: Family

## 2022-07-06 DIAGNOSIS — M7989 Other specified soft tissue disorders: Secondary | ICD-10-CM | POA: Diagnosis not present

## 2022-07-06 DIAGNOSIS — S9031XA Contusion of right foot, initial encounter: Secondary | ICD-10-CM | POA: Diagnosis not present

## 2022-07-06 DIAGNOSIS — S99921A Unspecified injury of right foot, initial encounter: Secondary | ICD-10-CM | POA: Diagnosis not present

## 2022-07-12 ENCOUNTER — Ambulatory Visit (HOSPITAL_BASED_OUTPATIENT_CLINIC_OR_DEPARTMENT_OTHER): Payer: Medicaid Other | Admitting: Cardiology

## 2022-07-12 ENCOUNTER — Encounter: Payer: Self-pay | Admitting: Cardiology

## 2022-07-12 ENCOUNTER — Other Ambulatory Visit (HOSPITAL_COMMUNITY): Payer: Self-pay

## 2022-07-12 ENCOUNTER — Other Ambulatory Visit: Payer: Self-pay | Admitting: Cardiology

## 2022-07-12 ENCOUNTER — Telehealth (HOSPITAL_COMMUNITY): Payer: Self-pay

## 2022-07-12 ENCOUNTER — Other Ambulatory Visit
Admission: RE | Admit: 2022-07-12 | Discharge: 2022-07-12 | Disposition: A | Discharge status: Home / Self Care | Payer: Medicaid Other | Source: Ambulatory Visit | Attending: Cardiology | Admitting: Cardiology

## 2022-07-12 VITALS — BP 126/60 | HR 80 | Wt 163.6 lb

## 2022-07-12 DIAGNOSIS — Z79899 Other long term (current) drug therapy: Secondary | ICD-10-CM | POA: Insufficient documentation

## 2022-07-12 DIAGNOSIS — I5032 Chronic diastolic (congestive) heart failure: Secondary | ICD-10-CM

## 2022-07-12 DIAGNOSIS — I4821 Permanent atrial fibrillation: Secondary | ICD-10-CM | POA: Diagnosis not present

## 2022-07-12 DIAGNOSIS — I110 Hypertensive heart disease with heart failure: Secondary | ICD-10-CM | POA: Diagnosis not present

## 2022-07-12 DIAGNOSIS — J90 Pleural effusion, not elsewhere classified: Secondary | ICD-10-CM | POA: Insufficient documentation

## 2022-07-12 DIAGNOSIS — Z7901 Long term (current) use of anticoagulants: Secondary | ICD-10-CM | POA: Diagnosis not present

## 2022-07-12 DIAGNOSIS — G4733 Obstructive sleep apnea (adult) (pediatric): Secondary | ICD-10-CM | POA: Insufficient documentation

## 2022-07-12 LAB — CBC
HCT: 36.1 % (ref 36.0–46.0)
Hemoglobin: 11.1 g/dL — ABNORMAL LOW (ref 12.0–15.0)
MCH: 26.7 pg (ref 26.0–34.0)
MCHC: 30.7 g/dL (ref 30.0–36.0)
MCV: 87 fL (ref 80.0–100.0)
Platelets: 168 10*3/uL (ref 150–400)
RBC: 4.15 MIL/uL (ref 3.87–5.11)
RDW: 15.4 % (ref 11.5–15.5)
WBC: 5.1 10*3/uL (ref 4.0–10.5)
nRBC: 0 % (ref 0.0–0.2)

## 2022-07-12 LAB — BASIC METABOLIC PANEL
Anion gap: 10 (ref 5–15)
BUN: 17 mg/dL (ref 8–23)
CO2: 27 mmol/L (ref 22–32)
Calcium: 9.6 mg/dL (ref 8.9–10.3)
Chloride: 101 mmol/L (ref 98–111)
Creatinine, Ser: 0.47 mg/dL (ref 0.44–1.00)
GFR, Estimated: >60 mL/min (ref >60)
Glucose, Bld: 125 mg/dL — ABNORMAL HIGH (ref 70–99)
Potassium: 3.9 mmol/L (ref 3.5–5.1)
Sodium: 138 mmol/L (ref 135–145)

## 2022-07-12 LAB — BRAIN NATRIURETIC PEPTIDE: B Natriuretic Peptide: 339.6 pg/mL — ABNORMAL HIGH (ref 0.0–100.0)

## 2022-07-12 MED ORDER — DAPAGLIFLOZIN PROPANEDIOL 10 MG PO TABS: 10.0000 mg | ORAL_TABLET | Freq: Every day | ORAL | 3 refills | Status: DC

## 2022-07-12 MED ORDER — DOXYCYCLINE HYCLATE 100 MG PO CAPS: 100.0000 mg | ORAL_CAPSULE | Freq: Two times a day (BID) | ORAL | 0 refills | Status: DC

## 2022-07-12 MED ORDER — COLCHICINE 0.6 MG PO TABS: 0.6000 mg | ORAL_TABLET | Freq: Every day | ORAL | 2 refills | Status: DC

## 2022-07-12 NOTE — Patient Instructions (Addendum)
Stop Aspirin  Start Torsemide 20 mg (1 tablet) daily.  Start Farxiga 10 mg (1 tablet) daily.   Increase potassium to 40 mEq (2 tablets) daily.  Take Doxycycline 100mg  (1 tablet) twice a day for 2 weeks.    You will have a sleep study. You will receive a call to schedule this appointment. Please call this number if they haven't call you [phone]  You will have a follow up appointment with Dr. [name]   Please come back in 10 days to have lab work completed at [facility] 07/21/22 before 5:30pm.   Your physician has requested that you have an echocardiogram. Echocardiography is a painless test that uses sound waves to create images of your heart. It provides your doctor with information about the size and shape of your heart and how well your heart's chambers and valves are working. This procedure takes approximately one hour. There are no restrictions for this procedure. Please do NOT wear cologne, perfume, aftershave, or lotions (deodorant is allowed). Please arrive 15 minutes prior to your appointment time.  We will give you a call to give the date and time. This ECHO will be schedule for next week.       You are scheduled for a Cardiac Catheterization on Friday, April 26 with Dr. Marca Ancona.  1. Please arrive at the [facility] (Main Entrance A) at [facility]: [address] [place], [place] [address] at 10:00 AM (This time is two hours before your procedure to ensure your preparation). Free valet parking service is available.   Special note: Every effort is made to have your procedure done on time. Please understand that emergencies sometimes delay scheduled procedures.  2. Diet: Do not eat solid foods after midnight.  The patient may have clear liquids until 5am upon the day of the procedure.  3. Labs: You will need to have blood drawn on , April 19 at [facility] Entrance, Go to 1st desk on your right to register.  Address: [address] [place], [place] [address]  Open: 8am - 5pm  Phone: [phone]. You do not need to be fasting.  4. Medication instructions in preparation for your procedure:   Contrast Allergy: No    Stop taking Eliquis (Apixiban) on Thursday, April 25. Resume a day after surgery  Stop taking, Torsemide (Demadex) Friday, April 26, resume the next day    Do not take Diabetes Med Glucophage (Metformin) on the day of the procedure and HOLD 48 HOURS AFTER THE PROCEDURE.  5. Plan to go home the same day, you will only stay overnight if medically necessary. 6. Bring a current list of your medications and current insurance cards. 7. You MUST have a responsible person to drive you home. 8. Someone MUST be with you the first 24 hours after you arrive home or your discharge will be delayed. 9. Please wear clothes that are easy to get on and off and wear slip-on shoes.  Thank you for allowing [place] to care for you!   -- [facility]

## 2022-07-12 NOTE — Progress Notes (Signed)
Medication Samples have been provided to the patient.  Drug name: Marcelline Deist       Strength: 10mg         Qty: 2  LOT: [id number]  Exp.Date: 01/31/2025  Dosing instructions: take one tablet daily before breakfast.  The patient has been instructed regarding the correct time, dose, and frequency of taking this medication, including desired effects and most common side effects.   Electa Sniff 4:47 PM 07/12/2022

## 2022-07-12 NOTE — Telephone Encounter (Signed)
Patient Advocate Encounter  Prior authorization for [place] submitted and APPROVED. Test billing returns $4 copay for 90 day supply.  Key [id number] Effective: 07/12/2022 - 07/12/2023  Burnell Blanks, CPhT Rx Patient Advocate Phone: [phone]

## 2022-07-12 NOTE — Telephone Encounter (Signed)
[name] not taking Diltiazem.

## 2022-07-12 NOTE — Progress Notes (Signed)
PCP: [name], MD EP: Dr Maisie Fus ([facility]) HF Cardiology: Dr. [name]  62 y.o. with history of permanent atrial fibrillation, diastolic CHF, and diabetes was referred for CHF MD evaluation by [name]. Patient has a long history of atrial fibrillation.  She has failed multiple anti-arrhythmic agents (flecainide, amiodarone, dronedarone, and propafenone).  She had an initial ablation in 5/21, then a redo ablation in 8/22.  At the time of 8/22 ablation, foci of atrial tachycardia were noted that were not ablated due to proximity to the AV node.  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  She had ongoing atrial arrhythmia episodes and underwent convergent endocardial/epicardial ablation at [facility] in 9/23 with left atrial appendage clipping.  This was complicated by ileus and then left hemothorax.  She developed a recurrent left pleural effusion and ended up with left VATS in 10/23.  Atrial fibrillation recurred, and ultimately, she had AV nodal ablation with [facility] Scientific DDD pacemaker with left bundle lead placed.    Since her surgery, she has had on and off pain radiating from left lower chest to upper back.  Pain is pleuritic.  She was started on colchicine about a month ago, this has helped.   She has been very short of breath since her surgery in 9/23.  She sleeps with oxygen at night.  She has orthopnea requiring 3 pillows.  [name] short of breath with chores around the house and with walking 20-30 feet.  [name] now on disability.  No lightheadedness/syncope.   She dropped something on her right foot and developed a small ulceration.  This area on the right dorsum of the foot is now erythematous and swollen.   Labs (3/24): K 4.1, creatinine 0.55, BNP 299  PMH: 1. Atrial fibrillation/atrial flutter:  Now permanent. Tried and failed flecainide, amiodarone, dronedarone, and propafenone.  - AF ablation 5/21.   - AF ablation 8/22.  - Convergent  epicardial/endocardial ablation with LA appendage clipping in 9/23.  Complicated by left hemothorax and then recurrent left pleural effusion, had left VATS in 10/23.  - AV nodal ablation with [employer] DDD pacemaker with left bundle lead placed 2/24.  2. Atrial tachycardia: Noted at time of AF ablation in 8/22.  Not ablated, near AV node.  3. 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  4. Chronic diastolic CHF: Cardiac MRI in 2/21 showed EF 61%, normal RV, no LGE.  - Cardiolite (3/22): No ischemia/infarction.  - Echo (10/23) with EF 50%, normal RV, moderate TR.  5. HTN 6. Type 2 diabetes 7. Depression 8. Hyperlipidemia  Social History   Socioeconomic History   Marital status: Divorced    Spouse name: Not on file   Number of children: 3   Years of education: 12   Highest education level: High school graduate  Occupational History    Employer: [employer]    Comment: in special chemistry  Tobacco Use   Smoking status: Never   Smokeless tobacco: Never  Vaping Use   Vaping Use: Never used  Substance and Sexual Activity   Alcohol use: Yes    Alcohol/week: 4.0 standard drinks of alcohol    Types: 2 Glasses of wine, 2 Cans of beer per week   Drug use: No   Sexual activity: Yes    Partners: Male    Birth control/protection: Post-menopausal, Surgical  Other Topics Concern   Not on file  Social History Narrative   Not on file  Social Determinants of Health   Financial Resource Strain: Not on file  Food Insecurity: Not on file  Transportation Needs: Not on file  Physical Activity: Not on file  Stress: Not on file  Social Connections: Not on file  Intimate Partner Violence: Not on file   Family History  Problem Relation Age of Onset   Diabetes Mother    Hypertension Mother    Skin cancer Mother    Depression Mother    Alzheimer's disease Father    Diabetes Sister    Depression Sister    Diabetes Brother    Colon cancer Maternal Uncle    Hypertension  Maternal Grandfather    Hyperlipidemia Maternal Grandfather    Diabetes Paternal Grandmother    Heart disease Paternal Grandmother    Prostate cancer Paternal Grandfather    Ovarian cancer Cousin    Breast cancer Neg Hx    Cervical cancer Neg Hx    ROS: All systems reviewed and negative except as per HPI.   Current Outpatient Medications  Medication Sig Dispense Refill   apixaban (ELIQUIS) 5 MG TABS tablet Take 5 mg by mouth 2 (two) times daily.     dapagliflozin propanediol (FARXIGA) 10 MG TABS tablet Take 1 tablet (10 mg total) by mouth daily before breakfast. 30 tablet 3   doxycycline (VIBRAMYCIN) 100 MG capsule Take 1 capsule (100 mg total) by mouth 2 (two) times daily. 28 capsule 0   Iron, Ferrous Sulfate, 325 (65 Fe) MG TABS Take 325 mg by mouth daily. 30 tablet 5   linaclotide (LINZESS) 145 MCG CAPS capsule Take 1 capsule (145 mcg total) by mouth daily before breakfast. (Patient taking differently: Take 72 mcg by mouth daily as needed (constipation).) 90 capsule 1   Magnesium 400 MG CAPS Take 1 capsule by mouth daily at 12 noon.     Magnesium Oxide -Mg Supplement (TRUE MAGNESIUM OXIDE) 500 MG TABS Take by mouth.     metFORMIN (GLUCOPHAGE) 1000 MG tablet Take 1 tablet by mouth once daily 90 tablet 1   omeprazole (PRILOSEC OTC) 20 MG tablet Take 20 mg by mouth in the morning and at bedtime.     oxyCODONE-acetaminophen (PERCOCET/ROXICET) 5-325 MG tablet Take by mouth every 4 (four) hours as needed for severe pain.     potassium chloride SA (KLOR-CON M) 20 MEQ tablet Take 1 tablet (20 mEq total) by mouth daily. 30 tablet 5   rosuvastatin (CRESTOR) 5 MG tablet Take by mouth.     Torsemide 40 MG TABS Take 20 mg by mouth as needed.     traMADol (ULTRAM) 50 MG tablet Take 50 mg by mouth every 6 (six) hours as needed.     venlafaxine XR (EFFEXOR-XR) 150 MG 24 hr capsule TAKE 1 CAPSULE BY MOUTH ONCE DAILY WITH BREAKFAST 90 capsule 1   zolpidem (AMBIEN) 5 MG tablet Take 1 tablet (5 mg total)  by mouth at bedtime as needed. for sleep (Patient taking differently: Take 5 mg by mouth at bedtime. for sleep) 30 tablet 5   colchicine 0.6 MG tablet TAKE 1 TABLET BY MOUTH ONCE DAILY 60 tablet 2   No current facility-administered medications for this visit.   BP 126/60   Pulse 80   Wt 163 lb 9.6 oz (74.2 kg)   LMP 04/13/2011   SpO2 96%   BMI 24.88 kg/m  General: NAD Neck: JVP 12-13 cm with Kussmaul's sign, no thyromegaly or thyroid nodule.  Lungs: Decreased BS at bases.  CV: Nondisplaced PMI.  Heart regular S1/S2, no S3/S4, no murmur.  1+ edema 1/2 to knees bilaterally.  No carotid bruit.  Normal pedal pulses.  Abdomen: Soft, nontender, no hepatosplenomegaly, no distention.  Skin: Intact without lesions or rashes.  Neurologic: Alert and oriented x 3.  Psych: Normal affect. Extremities: No clubbing or cyanosis.  HEENT: Normal.   Assessment/Plan: 1. Chronic diastolic CHF: Echo (10/23) with EF 50%, normal RV, moderate TR.  Patient is not taking torsemide regularly (using prn, once a week or so).  She remains markedly symptomatic, NYHA class III.  [name] volume overloaded on exam.  She has a possible Kussmaul's sign on exam and also has had pericardial-type symptoms with pleuritic chest pain on and off since 9/23 convergent procedure.  She had a left-sided VATS because of persistent pleural effusion. [name] currently on colchicine (for 1 month now), says it has helped her chest pain but is about to run out.  I am concerned that she could have constrictive pericarditis post-cardiac surgery.  - I will arrange for an echo to assess for constrictive pericarditis (with respirometry).  - I will arrange for RHC to assess filling pressures and also hemodynamic LHC if echo is concerning for pericardial constriction so that I can look for ventricular interdependence. We discussed risks/benefits and she agrees to procedure.  - Start torsemide 20 mg daily and increase KCl to 40 daily.  BMET/BNP today  and BMET in 10 days.  - Start Farxiga 10 mg daily.  - I will have her continue colchicine for 2 more months. CBC today.  2. Cellulitis: Suspect infected cut on diabetic foot on right.   - Start doxycycline 100 mg bid x 2 wks, needs close followup with PCP.  3. Atrial arrhythmias: She has history of atrial fibrillation, atrial flutter, atrial tachycardia, and AVNRT. Now with permanent atrial fibrillation despite 2 endocardial ablations and then a convergent endocardial/epicardial ablation.  She has failed multiple anti-arrhythmics as noted above. Now s/p AV nodal ablation with [facility] Scientific CRT-D device.  - Continue apixaban.  - Stop ASA given apixaban use.  4. OSA: Daytime sleepiness and low oxygen saturation at night.   - I will arrange for sleep study.   Followup 1 month after echo, cath.   Marca Ancona 07/12/2022

## 2022-07-12 NOTE — H&P (View-Only) (Signed)
PCP: Bacigalupo, [name], MD EP: Dr Thomas ([facility]) HF Cardiology: Dr. Dina Warbington  61 y.o. with history of permanent atrial fibrillation, diastolic CHF, and diabetes was referred for CHF MD evaluation by Tina Hackney. Patient has a long history of atrial fibrillation.  She has failed multiple anti-arrhythmic agents (flecainide, amiodarone, dronedarone, and propafenone).  She had an initial ablation in 5/21, then a redo ablation in 8/22.  At the time of 8/22 ablation, foci of atrial tachycardia were noted that were not ablated due to proximity to the AV node.  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  She had ongoing atrial arrhythmia episodes and underwent convergent endocardial/epicardial ablation at [facility] in 9/23 with left atrial appendage clipping.  This was complicated by ileus and then left hemothorax.  She developed a recurrent left pleural effusion and ended up with left VATS in 10/23.  Atrial fibrillation recurred, and ultimately, she had AV nodal ablation with [employer] DDD pacemaker with left bundle lead placed.    Since her surgery, she has had on and off pain radiating from left lower chest to upper back.  Pain is pleuritic.  She was started on colchicine about a month ago, this has helped.   She has been very short of breath since her surgery in 9/23.  She sleeps with oxygen at night.  She has orthopnea requiring 3 pillows.  [name] short of breath with chores around the house and with walking 20-30 feet.  [name] now on disability.  No lightheadedness/syncope.   She dropped something on her right foot and developed a small ulceration.  This area on the right dorsum of the foot is now erythematous and swollen.   Labs (3/24): K 4.1, creatinine 0.55, BNP 299  PMH: 1. Atrial fibrillation/atrial flutter:  Now permanent. Tried and failed flecainide, amiodarone, dronedarone, and propafenone.  - AF ablation 5/21.   - AF ablation 8/22.  - Convergent  epicardial/endocardial ablation with LA appendage clipping in 9/23.  Complicated by left hemothorax and then recurrent left pleural effusion, had left VATS in 10/23.  - AV nodal ablation with [employer] DDD pacemaker with left bundle lead placed 2/24.  2. Atrial tachycardia: Noted at time of AF ablation in 8/22.  Not ablated, near AV node.  3. 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  4. Chronic diastolic CHF: Cardiac MRI in 2/21 showed EF 61%, normal RV, no LGE.  - Cardiolite (3/22): No ischemia/infarction.  - Echo (10/23) with EF 50%, normal RV, moderate TR.  5. HTN 6. Type 2 diabetes 7. Depression 8. Hyperlipidemia  Social History   Socioeconomic History   Marital status: Divorced    Spouse name: Not on file   Number of children: 3   Years of education: 12   Highest education level: High school graduate  Occupational History    Employer: [employer]    Comment: in special chemistry  Tobacco Use   Smoking status: Never   Smokeless tobacco: Never  Vaping Use   Vaping Use: Never used  Substance and Sexual Activity   Alcohol use: Yes    Alcohol/week: 4.0 standard drinks of alcohol    Types: 2 Glasses of wine, 2 Cans of beer per week   Drug use: No   Sexual activity: Yes    Partners: Male    Birth control/protection: Post-menopausal, Surgical  Other Topics Concern   Not on file  Social History Narrative   Not on file     Social Determinants of Health   Financial Resource Strain: Not on file  Food Insecurity: Not on file  Transportation Needs: Not on file  Physical Activity: Not on file  Stress: Not on file  Social Connections: Not on file  Intimate Partner Violence: Not on file   Family History  Problem Relation Age of Onset   Diabetes Mother    Hypertension Mother    Skin cancer Mother    Depression Mother    Alzheimer's disease Father    Diabetes Sister    Depression Sister    Diabetes Brother    Colon cancer Maternal Uncle    Hypertension  Maternal Grandfather    Hyperlipidemia Maternal Grandfather    Diabetes Paternal Grandmother    Heart disease Paternal Grandmother    Prostate cancer Paternal Grandfather    Ovarian cancer Cousin    Breast cancer Neg Hx    Cervical cancer Neg Hx    ROS: All systems reviewed and negative except as per HPI.   Current Outpatient Medications  Medication Sig Dispense Refill   apixaban (ELIQUIS) 5 MG TABS tablet Take 5 mg by mouth 2 (two) times daily.     dapagliflozin propanediol (FARXIGA) 10 MG TABS tablet Take 1 tablet (10 mg total) by mouth daily before breakfast. 30 tablet 3   doxycycline (VIBRAMYCIN) 100 MG capsule Take 1 capsule (100 mg total) by mouth 2 (two) times daily. 28 capsule 0   Iron, Ferrous Sulfate, 325 (65 Fe) MG TABS Take 325 mg by mouth daily. 30 tablet 5   linaclotide (LINZESS) 145 MCG CAPS capsule Take 1 capsule (145 mcg total) by mouth daily before breakfast. (Patient taking differently: Take 72 mcg by mouth daily as needed (constipation).) 90 capsule 1   Magnesium 400 MG CAPS Take 1 capsule by mouth daily at 12 noon.     Magnesium Oxide -Mg Supplement (TRUE MAGNESIUM OXIDE) 500 MG TABS Take by mouth.     metFORMIN (GLUCOPHAGE) 1000 MG tablet Take 1 tablet by mouth once daily 90 tablet 1   omeprazole (PRILOSEC OTC) 20 MG tablet Take 20 mg by mouth in the morning and at bedtime.     oxyCODONE-acetaminophen (PERCOCET/ROXICET) 5-325 MG tablet Take by mouth every 4 (four) hours as needed for severe pain.     potassium chloride SA (KLOR-CON M) 20 MEQ tablet Take 1 tablet (20 mEq total) by mouth daily. 30 tablet 5   rosuvastatin (CRESTOR) 5 MG tablet Take by mouth.     Torsemide 40 MG TABS Take 20 mg by mouth as needed.     traMADol (ULTRAM) 50 MG tablet Take 50 mg by mouth every 6 (six) hours as needed.     venlafaxine XR (EFFEXOR-XR) 150 MG 24 hr capsule TAKE 1 CAPSULE BY MOUTH ONCE DAILY WITH BREAKFAST 90 capsule 1   zolpidem (AMBIEN) 5 MG tablet Take 1 tablet (5 mg total)  by mouth at bedtime as needed. for sleep (Patient taking differently: Take 5 mg by mouth at bedtime. for sleep) 30 tablet 5   colchicine 0.6 MG tablet TAKE 1 TABLET BY MOUTH ONCE DAILY 60 tablet 2   No current facility-administered medications for this visit.   BP 126/60   Pulse 80   Wt 163 lb 9.6 oz (74.2 kg)   LMP 04/13/2011   SpO2 96%   BMI 24.88 kg/m  General: NAD Neck: JVP 12-13 cm with Kussmaul's sign, no thyromegaly or thyroid nodule.  Lungs: Decreased BS at bases.  CV: Nondisplaced PMI.    Heart regular S1/S2, no S3/S4, no murmur.  1+ edema 1/2 to knees bilaterally.  No carotid bruit.  Normal pedal pulses.  Abdomen: Soft, nontender, no hepatosplenomegaly, no distention.  Skin: Intact without lesions or rashes.  Neurologic: Alert and oriented x 3.  Psych: Normal affect. Extremities: No clubbing or cyanosis.  HEENT: Normal.   Assessment/Plan: 1. Chronic diastolic CHF: Echo (10/23) with EF 50%, normal RV, moderate TR.  Patient is not taking torsemide regularly (using prn, once a week or so).  She remains markedly symptomatic, NYHA class III.  [name] volume overloaded on exam.  She has a possible Kussmaul's sign on exam and also has had pericardial-type symptoms with pleuritic chest pain on and off since 9/23 convergent procedure.  She had a left-sided VATS because of persistent pleural effusion. [name] currently on colchicine (for 1 month now), says it has helped her chest pain but is about to run out.  I am concerned that she could have constrictive pericarditis post-cardiac surgery.  - I will arrange for an echo to assess for constrictive pericarditis (with respirometry).  - I will arrange for RHC to assess filling pressures and also hemodynamic LHC if echo is concerning for pericardial constriction so that I can look for ventricular interdependence. We discussed risks/benefits and she agrees to procedure.  - Start torsemide 20 mg daily and increase KCl to 40 daily.  BMET/BNP today  and BMET in 10 days.  - Start Farxiga 10 mg daily.  - I will have her continue colchicine for 2 more months. CBC today.  2. Cellulitis: Suspect infected cut on diabetic foot on right.   - Start doxycycline 100 mg bid x 2 wks, needs close followup with PCP.  3. Atrial arrhythmias: She has history of atrial fibrillation, atrial flutter, atrial tachycardia, and AVNRT. Now with permanent atrial fibrillation despite 2 endocardial ablations and then a convergent endocardial/epicardial ablation.  She has failed multiple anti-arrhythmics as noted above. Now s/p AV nodal ablation with [employer] CRT-D device.  - Continue apixaban.  - Stop ASA given apixaban use.  4. OSA: Daytime sleepiness and low oxygen saturation at night.   - I will arrange for sleep study.   Followup 1 month after echo, cath.   Julia Stark 07/12/2022  

## 2022-07-13 ENCOUNTER — Other Ambulatory Visit (HOSPITAL_COMMUNITY): Payer: Self-pay

## 2022-07-13 ENCOUNTER — Other Ambulatory Visit: Payer: Self-pay | Admitting: Cardiology

## 2022-07-13 DIAGNOSIS — I311 Chronic constrictive pericarditis: Secondary | ICD-10-CM

## 2022-07-13 DIAGNOSIS — I5032 Chronic diastolic (congestive) heart failure: Secondary | ICD-10-CM

## 2022-07-13 NOTE — Progress Notes (Signed)
I,Joseline E Rosas,acting as a scribe for [employer], PA-C.,have documented all relevant documentation on the behalf of Alfredia Ferguson, PA-C,as directed by  Alfredia Ferguson, PA-C while in the presence of Alfredia Ferguson, PA-C.   Established patient visit   Patient: Julia Stark   DOB: 1960-06-17   62 y.o. Female  MRN: [id number] Visit Date: 07/14/2022  Today's healthcare provider: Alfredia Ferguson, PA-C   Chief Complaint  Patient presents with   Foot Injury   Subjective    Foot Injury  Incident onset: Tuesday of last week and went to [facility] walk in and had xray-no fracture. The incident occurred at home. Injury mechanism: a big patio decoration fell on her foot. The pain is present in the right foot. The quality of the pain is described as aching. The pain is severe. The pain has been Constant since onset. Associated symptoms include an inability to bear weight. Associated symptoms comments: Swelling,redness. The symptoms are aggravated by weight bearing, movement and palpation. She has tried elevation, acetaminophen and heat (Currently on Doxy for the past 2 days, soaking it in warm water) for the symptoms.      Medications: Outpatient Medications Prior to Visit  Medication Sig   apixaban (ELIQUIS) 5 MG TABS tablet Take 5 mg by mouth 2 (two) times daily.   colchicine 0.6 MG tablet TAKE 1 TABLET BY MOUTH ONCE DAILY   dapagliflozin propanediol (FARXIGA) 10 MG TABS tablet Take 1 tablet (10 mg total) by mouth daily before breakfast.   doxycycline (VIBRAMYCIN) 100 MG capsule Take 1 capsule (100 mg total) by mouth 2 (two) times daily.   Iron, Ferrous Sulfate, 325 (65 Fe) MG TABS Take 325 mg by mouth daily.   Magnesium 400 MG CAPS Take 1 capsule by mouth daily at 12 noon.   Magnesium Oxide -Mg Supplement (TRUE MAGNESIUM OXIDE) 500 MG TABS Take by mouth.   metFORMIN (GLUCOPHAGE) 1000 MG tablet Take 1 tablet by mouth once daily   omeprazole (PRILOSEC OTC) 20 MG tablet Take 20 mg by mouth  in the morning and at bedtime.   oxyCODONE-acetaminophen (PERCOCET/ROXICET) 5-325 MG tablet Take by mouth every 4 (four) hours as needed for severe pain.   potassium chloride SA (KLOR-CON M) 20 MEQ tablet Take 1 tablet (20 mEq total) by mouth daily. (Patient taking differently: Take 20 mEq by mouth 2 (two) times daily.)   rosuvastatin (CRESTOR) 5 MG tablet Take by mouth.   Torsemide 40 MG TABS Take 20 mg by mouth in the morning.   traMADol (ULTRAM) 50 MG tablet Take 50 mg by mouth every 6 (six) hours as needed.   venlafaxine XR (EFFEXOR-XR) 150 MG 24 hr capsule TAKE 1 CAPSULE BY MOUTH ONCE DAILY WITH BREAKFAST   zolpidem (AMBIEN) 5 MG tablet Take 1 tablet (5 mg total) by mouth at bedtime as needed. for sleep (Patient taking differently: Take 5 mg by mouth at bedtime. for sleep)   linaclotide (LINZESS) 145 MCG CAPS capsule Take 1 capsule (145 mcg total) by mouth daily before breakfast. (Patient not taking: Reported on 07/14/2022)   No facility-administered medications prior to visit.    Review of Systems  Constitutional:  Negative for fatigue and fever.  Respiratory:  Negative for cough and shortness of breath.   Cardiovascular:  Negative for chest pain and leg swelling.  Gastrointestinal:  Negative for abdominal pain.  Musculoskeletal:  Positive for arthralgias.  Skin:  Positive for color change and wound.  Neurological:  Negative for dizziness and  headaches.      Objective    BP (!) 101/55 (BP Location: Right Arm, Patient Position: Sitting, Cuff Size: Normal)   Pulse 79   Temp 97.7 F (36.5 C) (Oral)   Resp 16   Wt 164 lb (74.4 kg)   LMP 04/13/2011   SpO2 98%   BMI 24.94 kg/m   Physical Exam Vitals reviewed.  Constitutional:      Appearance: [name] not ill-appearing.  HENT:     Head: Normocephalic.  Eyes:     Conjunctiva/sclera: Conjunctivae normal.  Cardiovascular:     Rate and Rhythm: Normal rate.  Pulmonary:     Effort: Pulmonary effort is normal. No respiratory  distress.  Feet:     Comments: Rt foot with erythema swelling warmth and indurated area top of foot. Bruising to toes. Neurological:     General: No focal deficit present.     Mental Status: [name] alert and oriented to person, place, and time.  Psychiatric:        Mood and Affect: Mood normal.        Behavior: Behavior normal.     Results for orders placed or performed during the hospital encounter of 07/14/22  ECHOCARDIOGRAM COMPLETE  Result Value Ref Range   S' Lateral 3.80 cm   Single Plane A4C EF 50.4 %   Area-P 1/2 3.66 cm2   Est EF 50 - 55%     Assessment & Plan     Cellulitis right foot W/ hematoma turned abscess  Pt just started doxycycline Advised this may need IV abx therapy-- pt would prefer to stay out of the ED Elevate foot, warm compress on hematoma, soak foot in warm water/epsom salts  Any worsening or lack of improvement in the next 1-2 days please go to ED for treatment  Pt expressed understanding  Continue tramadol q6 hours prn for pain. Recommending crutches, not to ambulate on foot. Return if symptoms worsen or fail to improve.      I, Alfredia Ferguson, PA-C have reviewed all documentation for this visit. The documentation on 07/14/22  for the exam, diagnosis, procedures, and orders are all accurate and complete.  Alfredia Ferguson, PA-C [facility] [address] [place], [place], [address] Office: [phone] Fax: [phone]   [facility]

## 2022-07-14 ENCOUNTER — Ambulatory Visit: Payer: Medicaid Other | Admitting: Physician Assistant

## 2022-07-14 ENCOUNTER — Encounter: Payer: Self-pay | Admitting: Physician Assistant

## 2022-07-14 ENCOUNTER — Ambulatory Visit (HOSPITAL_COMMUNITY)
Admission: RE | Admit: 2022-07-14 | Discharge: 2022-07-14 | Disposition: A | Discharge status: Home / Self Care | Payer: Medicaid Other | Source: Ambulatory Visit | Attending: Cardiology | Admitting: Cardiology

## 2022-07-14 VITALS — BP 101/55 | HR 79 | Temp 97.7°F | Resp 16 | Wt 164.0 lb

## 2022-07-14 DIAGNOSIS — I4819 Other persistent atrial fibrillation: Secondary | ICD-10-CM | POA: Diagnosis not present

## 2022-07-14 DIAGNOSIS — E785 Hyperlipidemia, unspecified: Secondary | ICD-10-CM | POA: Insufficient documentation

## 2022-07-14 DIAGNOSIS — I5032 Chronic diastolic (congestive) heart failure: Secondary | ICD-10-CM | POA: Diagnosis not present

## 2022-07-14 DIAGNOSIS — L02619 Cutaneous abscess of unspecified foot: Secondary | ICD-10-CM | POA: Diagnosis not present

## 2022-07-14 DIAGNOSIS — I10 Essential (primary) hypertension: Secondary | ICD-10-CM | POA: Diagnosis not present

## 2022-07-14 DIAGNOSIS — Z950 Presence of cardiac pacemaker: Secondary | ICD-10-CM | POA: Insufficient documentation

## 2022-07-14 DIAGNOSIS — E119 Type 2 diabetes mellitus without complications: Secondary | ICD-10-CM | POA: Insufficient documentation

## 2022-07-14 DIAGNOSIS — I4891 Unspecified atrial fibrillation: Secondary | ICD-10-CM | POA: Diagnosis not present

## 2022-07-14 DIAGNOSIS — L03119 Cellulitis of unspecified part of limb: Secondary | ICD-10-CM

## 2022-07-14 LAB — ECHOCARDIOGRAM COMPLETE
Area-P 1/2: 3.66 cm2
S' Lateral: 3.8 cm
Single Plane A4C EF: 50.4 %

## 2022-07-16 ENCOUNTER — Emergency Department: Payer: Medicaid Other

## 2022-07-16 ENCOUNTER — Other Ambulatory Visit: Payer: Self-pay

## 2022-07-16 ENCOUNTER — Emergency Department
Admission: EM | Admit: 2022-07-16 | Discharge: 2022-07-16 | Disposition: A | Discharge status: Home / Self Care | Payer: Medicaid Other | Attending: Emergency Medicine | Admitting: Emergency Medicine

## 2022-07-16 DIAGNOSIS — L03115 Cellulitis of right lower limb: Secondary | ICD-10-CM

## 2022-07-16 DIAGNOSIS — S9031XD Contusion of right foot, subsequent encounter: Secondary | ICD-10-CM | POA: Diagnosis not present

## 2022-07-16 DIAGNOSIS — S99921D Unspecified injury of right foot, subsequent encounter: Secondary | ICD-10-CM | POA: Diagnosis present

## 2022-07-16 DIAGNOSIS — I10 Essential (primary) hypertension: Secondary | ICD-10-CM | POA: Diagnosis not present

## 2022-07-16 DIAGNOSIS — E119 Type 2 diabetes mellitus without complications: Secondary | ICD-10-CM | POA: Insufficient documentation

## 2022-07-16 DIAGNOSIS — W010XXD Fall on same level from slipping, tripping and stumbling without subsequent striking against object, subsequent encounter: Secondary | ICD-10-CM | POA: Diagnosis not present

## 2022-07-16 DIAGNOSIS — R600 Localized edema: Secondary | ICD-10-CM | POA: Diagnosis not present

## 2022-07-16 DIAGNOSIS — M7989 Other specified soft tissue disorders: Secondary | ICD-10-CM | POA: Diagnosis not present

## 2022-07-16 DIAGNOSIS — T148XXA Other injury of unspecified body region, initial encounter: Secondary | ICD-10-CM

## 2022-07-16 LAB — CBC WITH DIFFERENTIAL/PLATELET
Abs Immature Granulocytes: 0.01 10*3/uL (ref 0.00–0.07)
Basophils Absolute: 0.1 10*3/uL (ref 0.0–0.1)
Basophils Relative: 1 %
Eosinophils Absolute: 0.3 10*3/uL (ref 0.0–0.5)
Eosinophils Relative: 4 %
HCT: 38.9 % (ref 36.0–46.0)
Hemoglobin: 11.6 g/dL — ABNORMAL LOW (ref 12.0–15.0)
Immature Granulocytes: 0 %
Lymphocytes Relative: 22 %
Lymphs Abs: 1.3 10*3/uL (ref 0.7–4.0)
MCH: 26.2 pg (ref 26.0–34.0)
MCHC: 29.8 g/dL — ABNORMAL LOW (ref 30.0–36.0)
MCV: 87.8 fL (ref 80.0–100.0)
Monocytes Absolute: 0.6 10*3/uL (ref 0.1–1.0)
Monocytes Relative: 11 %
Neutro Abs: 3.7 10*3/uL (ref 1.7–7.7)
Neutrophils Relative %: 62 %
Platelets: 198 10*3/uL (ref 150–400)
RBC: 4.43 MIL/uL (ref 3.87–5.11)
RDW: 15.1 % (ref 11.5–15.5)
WBC: 5.9 10*3/uL (ref 4.0–10.5)
nRBC: 0 % (ref 0.0–0.2)

## 2022-07-16 LAB — COMPREHENSIVE METABOLIC PANEL
ALT: 16 U/L (ref 0–44)
AST: 33 U/L (ref 15–41)
Albumin: 3.9 g/dL (ref 3.5–5.0)
Alkaline Phosphatase: 258 U/L — ABNORMAL HIGH (ref 38–126)
Anion gap: 10 (ref 5–15)
BUN: 16 mg/dL (ref 8–23)
CO2: 27 mmol/L (ref 22–32)
Calcium: 9.3 mg/dL (ref 8.9–10.3)
Chloride: 98 mmol/L (ref 98–111)
Creatinine, Ser: 0.55 mg/dL (ref 0.44–1.00)
GFR, Estimated: >60 mL/min (ref >60)
Glucose, Bld: 158 mg/dL — ABNORMAL HIGH (ref 70–99)
Potassium: 3.6 mmol/L (ref 3.5–5.1)
Sodium: 135 mmol/L (ref 135–145)
Total Bilirubin: 0.9 mg/dL (ref 0.3–1.2)
Total Protein: 8.6 g/dL — ABNORMAL HIGH (ref 6.5–8.1)

## 2022-07-16 MED ORDER — IOHEXOL 300 MG/ML  SOLN
100.0000 mL | Freq: Once | INTRAMUSCULAR | Status: AC | PRN
  Administered 2022-07-16: 100 mL via INTRAVENOUS

## 2022-07-16 MED ORDER — CEPHALEXIN 500 MG PO CAPS: 500.0000 mg | ORAL_CAPSULE | Freq: Four times a day (QID) | ORAL | 0 refills | Status: AC

## 2022-07-16 NOTE — ED Provider Notes (Signed)
[facility] Provider Note    None    (approximate)   History   Foot Injury   HPI  Julia Stark is a 62 y.o. female with a past medical history of type 2 diabetes, hypertension, hyperlipidemia, atrial flutter, underweight presents today for evaluation of foot injury.  Patient had an injury on 07/04/2022 or something fell off of the gate and landed on her foot.  She presented to the [facility] walk-in clinic and had an x-ray which was negative for fracture.  She was diagnosed with a hematoma.  She reports that she has been on antibiotics for the past 4 days and does not feel like her symptoms are getting any better.  She reports that she has pain with ambulation.  No paresthesias or weakness.  Patient Active Problem List   Diagnosis Date Noted   Anemia 02/27/2022   Acquired thrombophilia 07/14/2021   Status post radiofrequency ablation for arrhythmia 01/09/2021   On dronedarone therapy 11/16/2020   Renal stones 11/16/2020   Salivary gland calculi 11/16/2020   Ureteral stone 05/15/2020   Moderate episode of recurrent major depressive disorder 12/29/2019   Overweight 12/04/2019   Basal cell carcinoma of face 10/03/2019   Typical atrial flutter 08/15/2019   Lipoma of left upper extremity 08/04/2019   Primary insomnia 06/23/2019   Red blood cell antibody positive 05/22/2019   Chronic anticoagulation 06/19/2018   Affective disorder, major 08/22/2017   Allergic rhinitis 08/22/2017   Anxiety, generalized 08/22/2017   T2DM (type 2 diabetes mellitus) 08/22/2017   Hypertension associated with diabetes 08/22/2017   Hyperlipidemia associated with type 2 diabetes mellitus 08/22/2017   Mobitz type 2 second degree atrioventricular block 02/05/2017   Paroxysmal A-fib 05/25/2015   Recurrent genital HSV (herpes simplex virus) infection 03/13/2014   Chronic constipation 02/17/2014   Reflux gastritis 02/17/2014   Adenomatous polyp of descending colon 02/12/2012           Physical Exam   Triage Vital Signs: ED Triage Vitals [07/16/22 0657]  Enc Vitals Group     BP 119/65     Pulse Rate 80     Resp 16     Temp 98 F (36.7 C)     Temp Source Oral     SpO2 93 %     Weight      Height      Head Circumference      Peak Flow      Pain Score 4     Pain Loc      Pain Edu?      Excl. in GC?     Most recent vital signs: Vitals:   07/16/22 0657 07/16/22 1144  BP: 119/65 (!) 126/59  Pulse: 80 80  Resp: 16 16  Temp: 98 F (36.7 C)   SpO2: 93% 99%    Physical Exam Vitals and nursing note reviewed.  Constitutional:      General: Awake and alert. No acute distress.    Appearance: Normal appearance. The patient is normal weight.  HENT:     Head: Normocephalic and atraumatic.     Mouth: Mucous membranes are moist.  Eyes:     General: PERRL. Normal EOMs        Right eye: No discharge.        Left eye: No discharge.     Conjunctiva/sclera: Conjunctivae normal.  Cardiovascular:     Rate and Rhythm: Normal rate and regular rhythm.     Pulses: Normal pulses.  Heart sounds: Normal heart sounds Pulmonary:     Effort: Pulmonary effort is normal. No respiratory distress.     Breath sounds: Normal breath sounds.  Abdominal:     Abdomen is soft. There is no abdominal tenderness. No rebound or guarding. No distention. Musculoskeletal:        General: No swelling. Normal range of motion.     Cervical back: Normal range of motion and neck supple.  Right foot: Golf ball sized hematoma with overlying erythema to the top of the foot with bruising noted faintly to distal toes, as well as as the plantar aspect of her toes and arch of foot.  Normal 2+ pedal pulse.  Sensation intact light touch throughout.  Normal range of motion of all toes and ankle.  No lymphangitis.  Minimally tender to palpation.  Compartments are soft and compressible throughout.  No open wounds.  No skin compromise noted. Skin:    General: Skin is warm and dry.     Capillary  Refill: Capillary refill takes less than 2 seconds.     Findings: No rash.  Neurological:     Mental Status: The patient is awake and alert.      ED Results / Procedures / Treatments   Labs (all labs ordered are listed, but only abnormal results are displayed) Labs Reviewed  COMPREHENSIVE METABOLIC PANEL - Abnormal; Notable for the following components:      Result Value   Glucose, Bld 158 (*)    Total Protein 8.6 (*)    Alkaline Phosphatase 258 (*)    All other components within normal limits  CBC WITH DIFFERENTIAL/PLATELET - Abnormal; Notable for the following components:   Hemoglobin 11.6 (*)    MCHC 29.8 (*)    All other components within normal limits     EKG     RADIOLOGY I independently reviewed and interpreted imaging and agree with radiologists findings.     PROCEDURES:  Critical Care performed:   Procedures   MEDICATIONS ORDERED IN ED: Medications  iohexol (OMNIPAQUE) 300 MG/ML solution 100 mL (100 mLs Intravenous Contrast Given 07/16/22 1032)     IMPRESSION / MDM / ASSESSMENT AND PLAN / ED COURSE  I reviewed the triage vital signs and the nursing notes.   Differential diagnosis includes, but is not limited to, hematoma, abscess, cellulitis, occult fracture, osteomyelitis.  I reviewed the patient's chart. Patient had an injury on 07/04/2022 or something fell off of the gate and landed on her foot.  She presented to the [facility] walk-in clinic and had an x-ray which was negative for fracture.  She was diagnosed with a hematoma.  She was seen again on 07/14/2022 at which time it was suspected that her hematoma had turned into an abscess and she was started on doxycycline.  Patient presents emergency department awake and alert, hemodynamically stable and afebrile.  She does have an obvious area of swelling over the dorsum of her foot with overlying erythema.  However, there is no drainage from the wound.  There is no swelling of her foot diffusely.  There  is no lymphangitis.  No crepitus.  There is no swelling more proximally.  No systemic or constitutional type symptoms.  She has 2+ pedal pulses, no vascular compromise.  Compartments are soft and compressible throughout lower extremity, not consistent with compartment syndrome.  Patient agreed to further workup.  IV was established and labs were obtained.  Labs are overall reassuring, no leukocytosis.  X-ray did not reveal osseous abnormalities.  Given her history of diabetes and the concern that the PCP had for infection, I had originally planned an MRI for evaluation of abscess versus osteomyelitis, however after discussion with the MRI team patient is unable to get an MRI here since she has a pacemaker.  Therefore I opted for CT with contrast after discussion with the CT team.  CT reveals a collection measuring 2.5 x 2.5 cm that appears to be consistent with hematoma rather than abscess.  Clinically, her symptoms are consistent with hematoma.  I discussed all findings with Dr. Lilian Kapur with podiatry on-call who was also able to review her images and the photo that I had uploaded.  He recommends compression with an Ace wrap, elevation, and ice.  Patient admits to having been using heat, and I recommended that she stop using heat.  Will also add Keflex after discussion with Dr. Lilian Kapur for strep coverage in addition to the doxycycline that she is already taking.  Dr. Lilian Kapur agrees to see the patient in clinic, and patient agrees to follow-up.  The appropriate follow-up information was provided.  In the meantime, we discussed return precautions the emergency department.  Patient understands and agrees with plan.  She was discharged home with her family member.  Patient's presentation is most consistent with acute presentation with potential threat to life or bodily function.  Clinical Course as of 07/16/22 1211  Sun Jul 16, 2022  1610 [facility] Scientific DDD pacemaker. Discussed with MRI, unable to get an  MRI here. Will obtain CT instead [JP]  1132 Discussed with Dr. Lilian Kapur with podiatry who recommends adding Ace wrap, ice, elevation, and adding Keflex in addition to the doxycycline that she is already on, and patient will follow-up in his clinic [JP]    Clinical Course User Index [JP] Jaleena Viviani, Herb Grays, PA-C     FINAL CLINICAL IMPRESSION(S) / ED DIAGNOSES   Final diagnoses:  Injury of right foot, subsequent encounter  Hematoma  Cellulitis of right lower extremity     Rx / DC Orders   ED Discharge Orders          Ordered    cephALEXin (KEFLEX) 500 MG capsule  4 times daily        07/16/22 1140             Note:  This document was prepared using Dragon voice recognition software and may include unintentional dictation errors.   Keturah Shavers 07/16/22 1211    Chesley Noon, MD 07/16/22 [phone]

## 2022-07-16 NOTE — ED Notes (Signed)
Patient taken to CT scan.

## 2022-07-16 NOTE — ED Notes (Signed)
Patient transported to CT 

## 2022-07-16 NOTE — Discharge Instructions (Addendum)
Please follow-up with Dr. Lilian Kapur with podiatry.  Please start taking the Keflex in addition to the doxycycline that you were prescribed previously.  Please rest, ice, elevate your foot and use the compression bandage as we discussed.  Please return for any new, worsening, or change in symptoms or concerns.  It was a pleasure caring for you today.

## 2022-07-16 NOTE — ED Triage Notes (Signed)
Pt to ED via POV c/o possible infection to right foot. Pt hurt her right foot last week. Antibiotics were started Wednesday and was told to come here if foot did not get better. Pts foot swollen, red, and warm to touch. Pt in NAD at this time

## 2022-07-18 ENCOUNTER — Telehealth: Payer: Self-pay | Admitting: Nurse Practitioner

## 2022-07-18 ENCOUNTER — Other Ambulatory Visit: Payer: Self-pay | Admitting: Cardiology

## 2022-07-18 NOTE — Telephone Encounter (Signed)
The pt states that she is not sure that the food is actually getting stuck it just feels like "something in the esophagus"  She has been advised to keep her appt with Dr Rhea Belton on 4/30 and chew her food really well, avoid meats and tough foods.  She will take her meds with apple sauce. She has lost weight and is concerned because of her cardiac status. She has an appt with cardiology on 4/26 and will keep that appt as well.  She will call back if needed in the meantime.

## 2022-07-18 NOTE — Telephone Encounter (Signed)
Patient called states she has not been able to swallow properly, food getting stuck seeking advise.

## 2022-07-19 ENCOUNTER — Encounter: Payer: Self-pay | Admitting: Dermatology

## 2022-07-19 ENCOUNTER — Ambulatory Visit: Payer: Medicaid Other | Admitting: Dermatology

## 2022-07-19 VITALS — BP 128/69 | HR 72

## 2022-07-19 DIAGNOSIS — D492 Neoplasm of unspecified behavior of bone, soft tissue, and skin: Secondary | ICD-10-CM

## 2022-07-19 DIAGNOSIS — Z808 Family history of malignant neoplasm of other organs or systems: Secondary | ICD-10-CM

## 2022-07-19 DIAGNOSIS — L814 Other melanin hyperpigmentation: Secondary | ICD-10-CM

## 2022-07-19 DIAGNOSIS — L570 Actinic keratosis: Secondary | ICD-10-CM | POA: Diagnosis not present

## 2022-07-19 DIAGNOSIS — D229 Melanocytic nevi, unspecified: Secondary | ICD-10-CM

## 2022-07-19 DIAGNOSIS — Z1283 Encounter for screening for malignant neoplasm of skin: Secondary | ICD-10-CM

## 2022-07-19 DIAGNOSIS — L578 Other skin changes due to chronic exposure to nonionizing radiation: Secondary | ICD-10-CM | POA: Diagnosis not present

## 2022-07-19 DIAGNOSIS — L821 Other seborrheic keratosis: Secondary | ICD-10-CM | POA: Diagnosis not present

## 2022-07-19 DIAGNOSIS — Z85828 Personal history of other malignant neoplasm of skin: Secondary | ICD-10-CM | POA: Diagnosis not present

## 2022-07-19 DIAGNOSIS — D485 Neoplasm of uncertain behavior of skin: Secondary | ICD-10-CM

## 2022-07-19 HISTORY — DX: Actinic keratosis: L57.0

## 2022-07-19 NOTE — Progress Notes (Signed)
Follow-Up Visit   Subjective  Julia Stark is a 62 y.o. female who presents for the following: Skin Cancer Screening and Full Body Skin Exam, hx of BCC, family hx of Melanoma.  The patient presents for Total-Body Skin Exam (TBSE) for skin cancer screening and mole check. The patient has spots, moles and lesions to be evaluated, some may be new or changing and the patient has concerns that these could be cancer.  The following portions of the chart were reviewed this encounter and updated as appropriate: medications, allergies, medical history  Review of Systems:  No other skin or systemic complaints except as noted in HPI or Assessment and Plan.  Objective  Well appearing patient in no apparent distress; mood and affect are within normal limits.  A full examination was performed including scalp, head, eyes, ears, nose, lips, neck, chest, axillae, abdomen, back, buttocks, bilateral upper extremities, bilateral lower extremities, hands, feet, fingers, toes, fingernails, and toenails. All findings within normal limits unless otherwise noted below.   Relevant physical exam findings are noted in the Assessment and Plan.  right infraclavicular x 1 Erythematous thin papules/macules with gritty scale.   left chin 0.6 cm red patch        Assessment & Plan   LENTIGINES, SEBORRHEIC KERATOSES, HEMANGIOMAS - Benign normal skin lesions - Benign-appearing - Call for any changes  MELANOCYTIC NEVI - Tan-brown and/or pink-flesh-colored symmetric macules and papules - Benign appearing on exam today - Observation - Call clinic for new or changing moles - Recommend daily use of broad spectrum spf 30+ sunscreen to sun-exposed areas.   ACTINIC DAMAGE - Chronic condition, secondary to cumulative UV/sun exposure - diffuse scaly erythematous macules with underlying dyspigmentation - Recommend daily broad spectrum sunscreen SPF 30+ to sun-exposed areas, reapply every 2 hours as needed.  -  Staying in the shade or wearing long sleeves, sun glasses (UVA+UVB protection) and wide brim hats (4-inch brim around the entire circumference of the hat) are also recommended for sun protection.  - Call for new or changing lesions.  Telangiectasia Chest  Possibly due to recent chest and lung surgeries since they came up after surgery. No h/o exogenous estrogen. LFTs slightly elevated 07/16/2022 - Dilated blood vessel - Benign appearing on exam - Call for changes   HEMATOMA  Large traumatic hematoma on the fight foot  Patient had a CT scan Continue antibiotics as directed   HISTORY OF BASAL CELL CARCINOMA OF THE SKIN Forehead  - No evidence of recurrence today - Recommend regular full body skin exams - Recommend daily broad spectrum sunscreen SPF 30+ to sun-exposed areas, reapply every 2 hours as needed.  - Call if any new or changing lesions are noted between office visits   FAMILY HISTORY OF MELANOMA-mother   AK (actinic keratosis) right infraclavicular x 1  Actinic keratoses are precancerous spots that appear secondary to cumulative UV radiation exposure/sun exposure over time. They are chronic with expected duration over 1 year. A portion of actinic keratoses will progress to squamous cell carcinoma of the skin. It is not possible to reliably predict which spots will progress to skin cancer and so treatment is recommended to prevent development of skin cancer.  Recommend daily broad spectrum sunscreen SPF 30+ to sun-exposed areas, reapply every 2 hours as needed.  Recommend staying in the shade or wearing long sleeves, sun glasses (UVA+UVB protection) and wide brim hats (4-inch brim around the entire circumference of the hat). Call for new or changing lesions.  Destruction of lesion - right infraclavicular x 1 Complexity: simple   Destruction method: cryotherapy   Informed consent: discussed and consent obtained   Timeout:  patient name, date of birth, surgical site, and  procedure verified Lesion destroyed using liquid nitrogen: Yes   Region frozen until ice ball extended beyond lesion: Yes   Outcome: patient tolerated procedure well with no complications   Post-procedure details: wound care instructions given    Neoplasm of skin left chin  Skin / nail biopsy Type of biopsy: tangential   Informed consent: discussed and consent obtained   Patient was prepped and draped in usual sterile fashion: area prepped with alochol. Anesthesia: the lesion was anesthetized in a standard fashion   Anesthetic:  1% lidocaine w/ epinephrine 1-100,000 buffered w/ 8.4% NaHCO3 Instrument used: flexible razor blade   Hemostasis achieved with: pressure, aluminum chloride and electrodesiccation   Outcome: patient tolerated procedure well   Post-procedure details: wound care instructions given   Post-procedure details comment:  Ointment and small bandage  Specimen 1 - Surgical pathology Differential Diagnosis: R/O BCC  Check Margins: No  May consider Mohs surgery if biopsy confirm skin cancer discussed with patient    SKIN CANCER SCREENING PERFORMED TODAY.  Return in about 1 year (around 07/19/2023) for TBSE, hx of BCC.  IAngelique Holm, CMA, am acting as scribe for Armida Sans, MD .   Documentation: I have reviewed the above documentation for accuracy and completeness, and I agree with the above.  Armida Sans, MD

## 2022-07-19 NOTE — Patient Instructions (Addendum)
Wound Care Instructions  Cleanse wound gently with soap and water once a day then pat dry with clean gauze. Apply a thin coat of Petrolatum (petroleum jelly, "Vaseline") over the wound (unless you have an allergy to this). We recommend that you use a new, sterile tube of Vaseline. Do not pick or remove scabs. Do not remove the yellow or white "healing tissue" from the base of the wound.  Cover the wound with fresh, clean, nonstick gauze and secure with paper tape. You may use Band-Aids in place of gauze and tape if the wound is small enough, but would recommend trimming much of the tape off as there is often too much. Sometimes Band-Aids can irritate the skin.  You should call the office for your biopsy report after 1 week if you have not already been contacted.  If you experience any problems, such as abnormal amounts of bleeding, swelling, significant bruising, significant pain, or evidence of infection, please call the office immediately.  FOR ADULT SURGERY PATIENTS: If you need something for pain relief you may take 1 extra strength Tylenol (acetaminophen) AND 2 Ibuprofen (200mg each) together every 4 hours as needed for pain. (do not take these if you are allergic to them or if you have a reason you should not take them.) Typically, you may only need pain medication for 1 to 3 days.      Cryotherapy Aftercare  Wash gently with soap and water everyday.   Apply Vaseline and Band-Aid daily until healed.      Due to recent changes in healthcare laws, you may see results of your pathology and/or laboratory studies on MyChart before the doctors have had a chance to review them. We understand that in some cases there may be results that are confusing or concerning to you. Please understand that not all results are received at the same time and often the doctors may need to interpret multiple results in order to provide you with the best plan of care or course of treatment. Therefore, we ask that  you please give us 2 business days to thoroughly review all your results before contacting the office for clarification. Should we see a critical lab result, you will be contacted sooner.   If You Need Anything After Your Visit  If you have any questions or concerns for your doctor, please call our main line at [phone] and press option 4 to reach your doctor's medical assistant. If no one answers, please leave a voicemail as directed and we will return your call as soon as possible. Messages left after 4 pm will be answered the following business day.   You may also send us a message via MyChart. We typically respond to MyChart messages within 1-2 business days.  For prescription refills, please ask your pharmacy to contact our office. Our fax number is [phone].  If you have an urgent issue when the clinic is closed that cannot wait until the next business day, you can page your doctor at the number below.    Please note that while we do our best to be available for urgent issues outside of office hours, we are not available 24/7.   If you have an urgent issue and are unable to reach us, you may choose to seek medical care at your doctor's office, retail clinic, urgent care center, or emergency room.  If you have a medical emergency, please immediately call 911 or go to the emergency department.  Pager Numbers  - Dr.   Kowalski: [phone]  - Dr. Moye: [phone]  - Dr. Stewart: [phone]  In the event of inclement weather, please call our main line at [phone] for an update on the status of any delays or closures.  Dermatology Medication Tips: Please keep the boxes that topical medications come in in order to help keep track of the instructions about where and how to use these. Pharmacies typically print the medication instructions only on the boxes and not directly on the medication tubes.   If your medication is too expensive, please contact our office at  [phone] option 4 or send us a message through MyChart.   We are unable to tell what your co-pay for medications will be in advance as this is different depending on your insurance coverage. However, we may be able to find a substitute medication at lower cost or fill out paperwork to get insurance to cover a needed medication.   If a prior authorization is required to get your medication covered by your insurance company, please allow us 1-2 business days to complete this process.  Drug prices often vary depending on where the prescription is filled and some pharmacies may offer cheaper prices.  The website www.goodrx.com contains coupons for medications through different pharmacies. The prices here do not account for what the cost may be with help from insurance (it may be cheaper with your insurance), but the website can give you the price if you did not use any insurance.  - You can print the associated coupon and take it with your prescription to the pharmacy.  - You may also stop by our office during regular business hours and pick up a GoodRx coupon card.  - If you need your prescription sent electronically to a different pharmacy, notify our office through [facility] MyChart or by phone at [phone] option 4.     Si Usted Necesita Algo Despus de Su Visita  Tambin puede enviarnos un mensaje a travs de MyChart. Por lo general respondemos a los mensajes de MyChart en el transcurso de 1 a 2 das hbiles.  Para renovar recetas, por favor pida a su farmacia que se ponga en contacto con nuestra oficina. Nuestro nmero de fax es el [phone].  Si tiene un asunto urgente cuando la clnica est cerrada y que no puede esperar hasta el siguiente da hbil, puede llamar/localizar a su doctor(a) al nmero que aparece a continuacin.   Por favor, tenga en cuenta que aunque hacemos todo lo posible para estar disponibles para asuntos urgentes fuera del horario de oficina, no estamos  disponibles las 24 horas del da, los 7 das de la semana.   Si tiene un problema urgente y no puede comunicarse con nosotros, puede optar por buscar atencin mdica  en el consultorio de su doctor(a), en una clnica privada, en un centro de atencin urgente o en una sala de emergencias.  Si tiene una emergencia mdica, por favor llame inmediatamente al 911 o vaya a la sala de emergencias.  Nmeros de bper  - Dr. Kowalski: [phone]  - Dra. Moye: [phone]  - Dra. Stewart: [phone]  En caso de inclemencias del tiempo, por favor llame a nuestra lnea principal al [phone] para una actualizacin sobre el estado de cualquier retraso o cierre.  Consejos para la medicacin en dermatologa: Por favor, guarde las cajas en las que vienen los medicamentos de uso tpico para ayudarle a seguir las instrucciones sobre dnde y cmo usarlos. Las farmacias generalmente imprimen las instrucciones del medicamento slo   en las cajas y no directamente en los tubos del medicamento.   Si su medicamento es muy caro, por favor, pngase en contacto con nuestra oficina llamando al [phone] y presione la opcin 4 o envenos un mensaje a travs de MyChart.   No podemos decirle cul ser su copago por los medicamentos por adelantado ya que esto es diferente dependiendo de la cobertura de su seguro. Sin embargo, es posible que podamos encontrar un medicamento sustituto a menor costo o llenar un formulario para que el seguro cubra el medicamento que se considera necesario.   Si se requiere una autorizacin previa para que su compaa de seguros cubra su medicamento, por favor permtanos de 1 a 2 das hbiles para completar este proceso.  Los precios de los medicamentos varan con frecuencia dependiendo del lugar de dnde se surte la receta y alguna farmacias pueden ofrecer precios ms baratos.  El sitio web www.goodrx.com tiene cupones para medicamentos de diferentes farmacias. Los precios aqu no  tienen en cuenta lo que podra costar con la ayuda del seguro (puede ser ms barato con su seguro), pero el sitio web puede darle el precio si no utiliz ningn seguro.  - Puede imprimir el cupn correspondiente y llevarlo con su receta a la farmacia.  - Tambin puede pasar por nuestra oficina durante el horario de atencin regular y recoger una tarjeta de cupones de GoodRx.  - Si necesita que su receta se enve electrnicamente a una farmacia diferente, informe a nuestra oficina a travs de MyChart de [facility] o por telfono llamando al [phone] y presione la opcin 4.  

## 2022-07-21 ENCOUNTER — Other Ambulatory Visit
Admission: RE | Admit: 2022-07-21 | Discharge: 2022-07-21 | Disposition: A | Discharge status: Home / Self Care | Payer: Medicaid Other | Attending: Cardiology | Admitting: Cardiology

## 2022-07-21 DIAGNOSIS — I5032 Chronic diastolic (congestive) heart failure: Secondary | ICD-10-CM | POA: Diagnosis not present

## 2022-07-21 LAB — BASIC METABOLIC PANEL
Anion gap: 9 (ref 5–15)
BUN: 18 mg/dL (ref 8–23)
CO2: 30 mmol/L (ref 22–32)
Calcium: 9.4 mg/dL (ref 8.9–10.3)
Chloride: 98 mmol/L (ref 98–111)
Creatinine, Ser: 0.65 mg/dL (ref 0.44–1.00)
GFR, Estimated: >60 mL/min (ref >60)
Glucose, Bld: 121 mg/dL — ABNORMAL HIGH (ref 70–99)
Potassium: 3.5 mmol/L (ref 3.5–5.1)
Sodium: 137 mmol/L (ref 135–145)

## 2022-07-25 ENCOUNTER — Telehealth: Payer: Self-pay

## 2022-07-25 NOTE — Telephone Encounter (Signed)
Patient informed of pathology results and appointment scheduled.  °

## 2022-07-25 NOTE — Telephone Encounter (Signed)
-----   Message from Deirdre Evener, MD sent at 07/25/2022 11:25 AM EDT ----- Diagnosis Skin , left chin ACTINIC KERATOSIS, TRAUMATIZED  PreCancer - Traumatized Let heal and treat in about 3 months with LN2 Schedule for 3 mos

## 2022-07-28 ENCOUNTER — Encounter (HOSPITAL_COMMUNITY): Payer: Self-pay | Admitting: Cardiology

## 2022-07-28 ENCOUNTER — Encounter (HOSPITAL_COMMUNITY)
Admission: RE | Disposition: A | Discharge status: Home / Self Care | Payer: Self-pay | Source: Ambulatory Visit | Attending: Cardiology

## 2022-07-28 ENCOUNTER — Other Ambulatory Visit: Payer: Self-pay

## 2022-07-28 ENCOUNTER — Ambulatory Visit (HOSPITAL_COMMUNITY)
Admission: RE | Admit: 2022-07-28 | Discharge: 2022-07-28 | Disposition: A | Discharge status: Home / Self Care | Payer: Medicaid Other | Source: Ambulatory Visit | Attending: Cardiology | Admitting: Cardiology

## 2022-07-28 DIAGNOSIS — Z7901 Long term (current) use of anticoagulants: Secondary | ICD-10-CM | POA: Insufficient documentation

## 2022-07-28 DIAGNOSIS — Z950 Presence of cardiac pacemaker: Secondary | ICD-10-CM | POA: Insufficient documentation

## 2022-07-28 DIAGNOSIS — I2721 Secondary pulmonary arterial hypertension: Secondary | ICD-10-CM | POA: Insufficient documentation

## 2022-07-28 DIAGNOSIS — Z79899 Other long term (current) drug therapy: Secondary | ICD-10-CM | POA: Insufficient documentation

## 2022-07-28 DIAGNOSIS — E119 Type 2 diabetes mellitus without complications: Secondary | ICD-10-CM | POA: Insufficient documentation

## 2022-07-28 DIAGNOSIS — G4733 Obstructive sleep apnea (adult) (pediatric): Secondary | ICD-10-CM | POA: Diagnosis not present

## 2022-07-28 DIAGNOSIS — I110 Hypertensive heart disease with heart failure: Secondary | ICD-10-CM | POA: Diagnosis not present

## 2022-07-28 DIAGNOSIS — I4821 Permanent atrial fibrillation: Secondary | ICD-10-CM | POA: Diagnosis not present

## 2022-07-28 DIAGNOSIS — I5032 Chronic diastolic (congestive) heart failure: Secondary | ICD-10-CM | POA: Insufficient documentation

## 2022-07-28 DIAGNOSIS — I311 Chronic constrictive pericarditis: Secondary | ICD-10-CM

## 2022-07-28 DIAGNOSIS — I509 Heart failure, unspecified: Secondary | ICD-10-CM | POA: Diagnosis not present

## 2022-07-28 HISTORY — PX: RIGHT AND LEFT HEART CATH: CATH118262

## 2022-07-28 LAB — POCT I-STAT EG7
Acid-Base Excess: 6 mmol/L — ABNORMAL HIGH (ref 0.0–2.0)
Acid-Base Excess: 7 mmol/L — ABNORMAL HIGH (ref 0.0–2.0)
Bicarbonate: 31.7 mmol/L — ABNORMAL HIGH (ref 20.0–28.0)
Bicarbonate: 32.6 mmol/L — ABNORMAL HIGH (ref 20.0–28.0)
Calcium, Ion: 1.19 mmol/L (ref 1.15–1.40)
Calcium, Ion: 1.25 mmol/L (ref 1.15–1.40)
HCT: 36 % (ref 36.0–46.0)
HCT: 37 % (ref 36.0–46.0)
Hemoglobin: 12.2 g/dL (ref 12.0–15.0)
Hemoglobin: 12.6 g/dL (ref 12.0–15.0)
O2 Saturation: 68 %
O2 Saturation: 70 %
Potassium: 4 mmol/L (ref 3.5–5.1)
Potassium: 4.1 mmol/L (ref 3.5–5.1)
Sodium: 139 mmol/L (ref 135–145)
Sodium: 140 mmol/L (ref 135–145)
TCO2: 33 mmol/L — ABNORMAL HIGH (ref 22–32)
TCO2: 34 mmol/L — ABNORMAL HIGH (ref 22–32)
pCO2, Ven: 51.2 mmHg (ref 44–60)
pCO2, Ven: 52.4 mmHg (ref 44–60)
pH, Ven: 7.4 (ref 7.25–7.43)
pH, Ven: 7.402 (ref 7.25–7.43)
pO2, Ven: 36 mmHg (ref 32–45)
pO2, Ven: 37 mmHg (ref 32–45)

## 2022-07-28 SURGERY — RIGHT AND LEFT HEART CATH
Anesthesia: LOCAL

## 2022-07-28 MED ORDER — MIDAZOLAM HCL 2 MG/2ML IJ SOLN
INTRAMUSCULAR | Status: DC | PRN
  Administered 2022-07-28 (×2): 1 mg via INTRAVENOUS

## 2022-07-28 MED ORDER — SODIUM CHLORIDE 0.9% FLUSH: 3.0000 mL | INTRAVENOUS | Status: DC | PRN

## 2022-07-28 MED ORDER — LIDOCAINE HCL (PF) 1 % IJ SOLN
INTRAMUSCULAR | Status: AC
  Filled 2022-07-28: qty 30

## 2022-07-28 MED ORDER — HEPARIN SODIUM (PORCINE) 1000 UNIT/ML IJ SOLN
INTRAMUSCULAR | Status: AC
  Filled 2022-07-28: qty 10

## 2022-07-28 MED ORDER — SODIUM CHLORIDE 0.9% FLUSH: 3.0000 mL | Freq: Two times a day (BID) | INTRAVENOUS | Status: DC

## 2022-07-28 MED ORDER — SODIUM CHLORIDE 0.9 % IV SOLN: INTRAVENOUS | Status: DC

## 2022-07-28 MED ORDER — FENTANYL CITRATE (PF) 100 MCG/2ML IJ SOLN
INTRAMUSCULAR | Status: DC | PRN
  Administered 2022-07-28 (×2): 25 ug via INTRAVENOUS

## 2022-07-28 MED ORDER — ASPIRIN 81 MG PO CHEW
81.0000 mg | CHEWABLE_TABLET | Freq: Once | ORAL | Status: AC
  Administered 2022-07-28: 81 mg via ORAL
  Filled 2022-07-28: qty 1

## 2022-07-28 MED ORDER — ACETAMINOPHEN 325 MG PO TABS: 650.0000 mg | ORAL_TABLET | ORAL | Status: DC | PRN

## 2022-07-28 MED ORDER — HEPARIN SODIUM (PORCINE) 1000 UNIT/ML IJ SOLN
INTRAMUSCULAR | Status: DC | PRN
  Administered 2022-07-28: 3000 [IU] via INTRAVENOUS

## 2022-07-28 MED ORDER — SODIUM CHLORIDE 0.9 % IV SOLN: 250.0000 mL | INTRAVENOUS | Status: DC | PRN

## 2022-07-28 MED ORDER — LIDOCAINE HCL (PF) 1 % IJ SOLN
INTRAMUSCULAR | Status: DC | PRN
  Administered 2022-07-28 (×2): 2 mL

## 2022-07-28 MED ORDER — MIDAZOLAM HCL 2 MG/2ML IJ SOLN
INTRAMUSCULAR | Status: AC
  Filled 2022-07-28: qty 2

## 2022-07-28 MED ORDER — APIXABAN 5 MG PO TABS: 5.0000 mg | ORAL_TABLET | Freq: Two times a day (BID) | ORAL | 6 refills | Status: DC

## 2022-07-28 MED ORDER — VERAPAMIL HCL 2.5 MG/ML IV SOLN
INTRAVENOUS | Status: DC | PRN
  Administered 2022-07-28: 10 mL via INTRA_ARTERIAL

## 2022-07-28 MED ORDER — VERAPAMIL HCL 2.5 MG/ML IV SOLN
INTRAVENOUS | Status: AC
  Filled 2022-07-28: qty 2

## 2022-07-28 MED ORDER — FENTANYL CITRATE (PF) 100 MCG/2ML IJ SOLN
INTRAMUSCULAR | Status: AC
  Filled 2022-07-28: qty 2

## 2022-07-28 MED ORDER — ONDANSETRON HCL 4 MG/2ML IJ SOLN: 4.0000 mg | Freq: Four times a day (QID) | INTRAMUSCULAR | Status: DC | PRN

## 2022-07-28 MED ORDER — HYDRALAZINE HCL 20 MG/ML IJ SOLN: 10.0000 mg | INTRAMUSCULAR | Status: DC | PRN

## 2022-07-28 MED ORDER — HEPARIN (PORCINE) IN NACL 1000-0.9 UT/500ML-% IV SOLN
INTRAVENOUS | Status: DC | PRN
  Administered 2022-07-28: 500 mL

## 2022-07-28 MED ORDER — LABETALOL HCL 5 MG/ML IV SOLN: 10.0000 mg | INTRAVENOUS | Status: DC | PRN

## 2022-07-28 SURGICAL SUPPLY — 9 items
CATH BALLN WEDGE 5F 110CM (CATHETERS) IMPLANT
CATH INFINITI 5FR ANG PIGTAIL (CATHETERS) IMPLANT
DEVICE RAD TR BAND REGULAR (VASCULAR PRODUCTS) IMPLANT
KIT HEART LEFT (KITS) ×1 IMPLANT
PACK CARDIAC CATHETERIZATION (CUSTOM PROCEDURE TRAY) ×1 IMPLANT
SHEATH GLIDE SLENDER 4/5FR (SHEATH) IMPLANT
TRANSDUCER W/STOPCOCK (MISCELLANEOUS) ×1 IMPLANT
WIRE EMERALD 3MM-J .025X260CM (WIRE) IMPLANT
WIRE EMERALD 3MM-J .035X150CM (WIRE) IMPLANT

## 2022-07-28 NOTE — Progress Notes (Signed)
Patient and sister was given discharge instructions. Both verbalized understanding. 

## 2022-07-28 NOTE — Interval H&P Note (Signed)
History and Physical Interval Note:  07/28/2022 12:52 PM  Julia Stark  has presented today for surgery, with the diagnosis of heart falure.  The various methods of treatment have been discussed with the patient and family. After consideration of risks, benefits and other options for treatment, the patient has consented to  Procedure(s): RIGHT/LEFT HEART CATH AND CORONARY ANGIOGRAPHY (N/A) as a surgical intervention.  The patient's history has been reviewed, patient examined, no change in status, stable for surgery.  I have reviewed the patient's chart and labs.  Questions were answered to the patient's satisfaction.     Hortense Cantrall [employer]

## 2022-07-28 NOTE — Discharge Instructions (Addendum)
Can restart Eliquis tomorrow morning.  We are ordering a V/Q scan to rule out chronic lung clot (could cause pulmonary hypertension)  Drink plenty of fluid for 48 hours and keep wrist elevated at heart level for 24 hours  Radial Site Care   This sheet gives you information about how to care for yourself after your procedure. Your health care provider may also give you more specific instructions. If you have problems or questions, contact your health care provider. What can I expect after the procedure? After the procedure, it is common to have: Bruising and tenderness at the catheter insertion area. Follow these instructions at home: Medicines Take over-the-counter and prescription medicines only as told by your health care provider. Insertion site care Follow instructions from your health care provider about how to take care of your insertion site. Make sure you: Wash your hands with soap and water before you change your bandage (dressing). If soap and water are not available, use hand sanitizer. remove your dressing as told by your health care provider. In 24 hours Check your insertion site every day for signs of infection. Check for: Redness, swelling, or pain. Fluid or blood. Pus or a bad smell. Warmth. Do not take baths, swim, or use a hot tub until your health care provider approves. You may shower 24-48 hours after the procedure, or as directed by your health care provider. Remove the dressing and gently wash the site with plain soap and water. Pat the area dry with a clean towel. Do not rub the site. That could cause bleeding. Do not apply powder or lotion to the site. Activity   For 24 hours after the procedure, or as directed by your health care provider: Do not flex or bend the affected arm. Do not push or pull heavy objects with the affected arm. Do not drive yourself home from the hospital or clinic. You may drive 24 hours after the procedure unless your health care  provider tells you not to. Do not operate machinery or power tools. Do not lift anything that is heavier than 10 lb (4.5 kg), or the limit that you are told, until your health care provider says that it is safe. For 4 days Ask your health care provider when it is okay to: Return to work or school. Resume usual physical activities or sports. Resume sexual activity. General instructions If the catheter site starts to bleed, raise your arm and put firm pressure on the site. If the bleeding does not stop, get help right away. This is a medical emergency. If you went home on the same day as your procedure, a responsible adult should be with you for the first 24 hours after you arrive home. Keep all follow-up visits as told by your health care provider. This is important. Contact a health care provider if: You have a fever. You have redness, swelling, or yellow drainage around your insertion site. Get help right away if: You have unusual pain at the radial site. The catheter insertion area swells very fast. The insertion area is bleeding, and the bleeding does not stop when you hold steady pressure on the area. Your arm or hand becomes pale, cool, tingly, or numb. These symptoms may represent a serious problem that is an emergency. Do not wait to see if the symptoms will go away. Get medical help right away. Call your local emergency services (911 in the [place] Do not drive yourself to the hospital. Summary After the procedure, it is common  to have bruising and tenderness at the site. Follow instructions from your health care provider about how to take care of your radial site wound. Check the wound every day for signs of infection. Do not lift anything that is heavier than 10 lb (4.5 kg), or the limit that you are told, until your health care provider says that it is safe. This information is not intended to replace advice given to you by your health care provider. Make sure you discuss any  questions you have with your health care provider. Document Revised: 04/25/2017 Document Reviewed: 04/25/2017 Elsevier Patient Education  2020 [employer].

## 2022-07-31 ENCOUNTER — Encounter: Payer: Self-pay | Admitting: Dermatology

## 2022-08-01 ENCOUNTER — Encounter: Payer: Self-pay | Admitting: Family Medicine

## 2022-08-01 ENCOUNTER — Ambulatory Visit: Payer: Medicaid Other | Admitting: Internal Medicine

## 2022-08-01 ENCOUNTER — Ambulatory Visit: Payer: Medicaid Other | Admitting: Podiatry

## 2022-08-01 ENCOUNTER — Encounter: Payer: Self-pay | Admitting: Internal Medicine

## 2022-08-01 VITALS — BP 126/68 | HR 80 | Ht 67.0 in | Wt 155.5 lb

## 2022-08-01 DIAGNOSIS — K5904 Chronic idiopathic constipation: Secondary | ICD-10-CM

## 2022-08-01 DIAGNOSIS — K224 Dyskinesia of esophagus: Secondary | ICD-10-CM | POA: Diagnosis not present

## 2022-08-01 DIAGNOSIS — K2960 Other gastritis without bleeding: Secondary | ICD-10-CM

## 2022-08-01 DIAGNOSIS — D509 Iron deficiency anemia, unspecified: Secondary | ICD-10-CM | POA: Diagnosis not present

## 2022-08-01 DIAGNOSIS — S9031XA Contusion of right foot, initial encounter: Secondary | ICD-10-CM | POA: Diagnosis not present

## 2022-08-01 NOTE — Patient Instructions (Addendum)
_______________________________________________________  If your blood pressure at your visit was 140/90 or greater, please contact your primary care physician to follow up on this. _______________________________________________________  If you are age 62 or younger, your body mass index should be between 19-25. Your Body mass index is 24.35 kg/m. If this is out of the aformentioned range listed, please consider follow up with your Primary Care Provider.  ________________________________________________________  The [facility] providers would like to encourage you to use [facility] to communicate with providers for non-urgent requests or questions.  Due to long hold times on the telephone, sending your provider a message by [facility] may be a faster and more efficient way to get a response.  Please allow 48 business hours for a response.  Please remember that this is for non-urgent requests.  _______________________________________________________  CONTINUE: omeprazole 20mg  twice daily  You are scheduled to follow up in our office on 12-13-22 at 2:10pm.  Thank you for entrusting me with your care and choosing [facility]  Dr Rhea Belton

## 2022-08-01 NOTE — Progress Notes (Signed)
  Subjective:  Patient ID: Julia Stark, female    DOB: 03/10/1961,  MRN: [id number]  Chief Complaint  Patient presents with   hematoma    np - right foot hematoma on top of foot    62 y.o. female presents with the above complaint. History confirmed with patient.  She was referred to me from the [facility] ER who consulted with me by phone on her case.  She completed the doxycycline, she says her symptoms are 150% better than they were pain is much better she is able to wear somewhat normal shoes again.  Objective:  Physical Exam: warm, good capillary refill, no trophic changes or ulcerative lesions, normal DP and PT pulses, normal sensory exam, and still palpable hematoma present on the right foot, no skin breakdown or necrosis, no warmth or signs of infection minimal tenderness here.  CT scan completed 07/16/2022 as well as right foot rest showed no fracture dislocation or Lisfranc injury, dorsal foot hematoma measuring 2.5 x 1.5 x 2.5 cm  Assessment:   1. Hematoma of right foot      Plan:  Patient was evaluated and treated and all questions answered.  Has made quite a bit of progress.  I reviewed the results of her CT scan taken from the ER which did not show any fracture or dislocation.  She may be WBAT in regular shoe gear.  Do not see further indication for antibiotics or drainage at this point as there is no evidence of skin breakdown or impending worsening of the hematoma she says it is improved quite a bit.  Advised this may take some more time to completely resolve I would recommend utilizing ice instead of heat due to her use of Eliquis which will cause increased swelling and further bleeding.  She will return to see me as needed if it does not improve or worsens. Return if symptoms worsen or fail to improve.

## 2022-08-01 NOTE — Progress Notes (Signed)
Subjective:    Patient ID: Julia Stark, female    DOB: 14-Apr-1960, 62 y.o.   MRN: [id number]  HPI Veanna Stark is a 62 year old female with a history of GERD, lymphocytic gastritis without H. pylori, remote adenomatous colon polyps, chronic constipation, iron deficiency anemia, atrial fibrillation with pacemaker placement on Eliquis, mild to moderate pulmonary hypertension who is here for follow-up.  She was last seen on 05/02/2022 by Alcide Evener.  At her last visit her [facility] colonoscopy was canceled because procedures were felt needed in the outpatient hospital setting.  I also discussed IDA evaluation and dysphagia.  A barium esophagram was ordered.  See below.  She was continued on Linzess 145 mcg daily.  Today she reports she continues to slowly recover.  She had a left and right heart cath just 3 days ago with Dr. [name].  She continues on Eliquis, Farxiga, metformin, Crestor, torsemide.  She reports that her dysphagia symptom is significantly better.  She can now eat most days without really feeling like food is sticking in her chest.  Occasionally this will happen.  Her weight loss has also stabilized and she attributes this to less muscle mass.  She estimates having maybe 3 or 4 episodes of feeling like food will not transit into the stomach normally since seeing Colleen in January.  [name] not having heartburn or nausea.  She has continued omeprazole 20 mg twice daily.  Bowel movements have been daily though occasionally hard.  [name] taking oral iron.  She uses Linzess on occasion but even less than once per week.  She was previously using this more regularly and constipation was more of an issue.  She has not seen blood in stool or melena   Review of Systems As per HPI, otherwise negative  Current Medications, Allergies, Past Medical History, Past Surgical History, Family History and Social History were reviewed in [employer] record.     Objective:   Physical Exam BP 126/68 (BP Location: Left Arm, Patient Position: Sitting, Cuff Size: Normal)   Pulse 80   Ht 5\' 7"  (1.702 m)   Wt 155 lb 8 oz (70.5 kg)   LMP 04/13/2011   SpO2 96%   BMI 24.35 kg/m  Gen: awake, alert, NAD HEENT: anicteric  CV: RRR, no mrg Pulm: CTA b/l Abd: soft, NT/ND, +BS throughout Ext: no c/c/e Neuro: nonfocal  CLINICAL DATA:  62 year old with extensive history of cardiac conversions, ablations, and thoracoscopy for chronic atrial fibrillation with complications. Now presents for esophagram due to feeling "food gets stuck in the mid chest."   EXAM: ESOPHAGUS/BARIUM SWALLOW/TABLET STUDY   TECHNIQUE: Combined double and single contrast examination was performed using effervescent crystals, high-density barium, and thin liquid barium. This exam was performed by Loyce Dys PA-C, and was supervised and interpreted by Irish Lack, MD.   FLUOROSCOPY: Radiation Exposure Index (as provided by the fluoroscopic device): 29.0 mGy Kerma   COMPARISON:  None Available.   FINDINGS: Swallowing: Appears normal. No vestibular penetration or aspiration seen.   Pharynx: Unremarkable.  No masses, lesions, or ulcers.   Esophagus: Normal appearance.  No masses, lesions, or ulcers.   Esophageal motility: Mild-moderate esophageal dysmotility characterized by delayed passage of barium bolus without evidence of tertiary contractions.   Hiatal Hernia: None.   Gastroesophageal reflux: None visualized.   Ingested 13mm barium tablet: Passed normally   Other: None.   IMPRESSION: Mild-moderate esophageal dysmotility characterized by delayed passage of barium bolus.  Electronically Signed   By: Irish Lack M.D.   On: 05/10/2022 16:16  Hemoccult negative x 3 on 05/30/2022     Latest Ref Rng & Units 07/28/2022    1:08 PM 07/28/2022    1:07 PM 07/16/2022    7:02 AM  CBC  WBC 4.0 - 10.5 K/uL   5.9   Hemoglobin 12.0 - 15.0 g/dL 16.1   09.6  04.5   Hematocrit 36.0 - 46.0 % 37.0  36.0  38.9   Platelets 150 - 400 K/uL   198    Iron/TIBC/Ferritin/ %Sat    Component Value Date/Time   IRON 35 (L) 05/02/2022 1156   IRON 32 03/14/2022 1057   IRON 25 01/10/2022 0000   TIBC 478.8 (H) 05/02/2022 1156   TIBC 379 03/14/2022 1057   TIBC 361 01/10/2022 0000   FERRITIN 41.6 05/02/2022 1156   FERRITIN 91 03/14/2022 1057   FERRITIN 86 01/10/2022 0000   IRONPCTSAT 7.3 (L) 05/02/2022 1156   IRONPCTSAT 8 (LL) 03/14/2022 1057        Assessment & Plan:  62 year old female with a history of GERD, lymphocytic gastritis without H. pylori, remote adenomatous colon polyps, chronic constipation, iron deficiency anemia, atrial fibrillation with pacemaker placement on Eliquis, mild to moderate pulmonary hypertension who is here for follow-up.   Esophageal dysmotility --upper endoscopy 2 years ago did not show any evidence of esophageal stricture.  Barium esophagram showed dysmotility but no stricture.  The barium tablet passed without delay.  This is likely related more globally to her cardiopulmonary issues.  Given improvement I am not going to specifically treat this today medically but think that Motegrity might be a good option if this becomes a problem for her again in the future.  Without stricture EGD with dilation would not be felt to benefit her. --Continue GERD controlled with omeprazole 20 mg twice daily --Given improvement no medical therapy for now but if recurrent issues with esophageal dysmotility and dysphagia in the future consider Motegrity  2.  Iron deficiency anemia --no GI blood loss and in fact stool was heme-negative in February of this year.  She has not had melena.  [name] up-to-date with a normal colonoscopy with excellent prep in January 2019.  [name] no longer anemic and iron studies are improving with oral iron.  Given heme-negative stool I do not recommend we repeat endoscopic evaluation at this time however if she  remains iron deficient we should consider endoscopic evaluation at follow-up --Continue oral iron with primary care to follow CBC and iron studies --Notify me if any visible blood in stool or melena --Consider random FOBT at follow-up  3.  Chronic constipation --less than an issue of late.  [name] using Linzess very occasionally.  She can continue as needed Linzess for now but we may consider Motegrity if needed as per #1  4.  Lymphocytic gastritis --diagnosed at EGD 2 years ago.  H. pylori negative.  Symptoms controlled with omeprazole twice daily --Continue omeprazole 20 mg twice daily  I would like to see her in August or September of this year, sooner if needed  40 minutes total spent today including patient facing time, coordination of care, reviewing medical history/procedures/pertinent radiology studies, and documentation of the encounter.

## 2022-08-02 MED ORDER — METFORMIN HCL 1000 MG PO TABS: 1000.0000 mg | ORAL_TABLET | Freq: Every day | ORAL | 1 refills | Status: DC

## 2022-08-07 ENCOUNTER — Ambulatory Visit: Payer: Medicaid Other | Attending: Otolaryngology

## 2022-08-07 DIAGNOSIS — I509 Heart failure, unspecified: Secondary | ICD-10-CM | POA: Insufficient documentation

## 2022-08-07 DIAGNOSIS — R0683 Snoring: Secondary | ICD-10-CM | POA: Diagnosis present

## 2022-08-07 DIAGNOSIS — I471 Supraventricular tachycardia, unspecified: Secondary | ICD-10-CM | POA: Insufficient documentation

## 2022-08-07 DIAGNOSIS — I110 Hypertensive heart disease with heart failure: Secondary | ICD-10-CM | POA: Diagnosis not present

## 2022-08-07 DIAGNOSIS — I4891 Unspecified atrial fibrillation: Secondary | ICD-10-CM | POA: Diagnosis not present

## 2022-08-07 DIAGNOSIS — E118 Type 2 diabetes mellitus with unspecified complications: Secondary | ICD-10-CM | POA: Diagnosis not present

## 2022-08-07 DIAGNOSIS — G4733 Obstructive sleep apnea (adult) (pediatric): Secondary | ICD-10-CM | POA: Insufficient documentation

## 2022-08-21 ENCOUNTER — Encounter: Payer: Self-pay | Admitting: Family

## 2022-08-22 ENCOUNTER — Other Ambulatory Visit: Payer: Self-pay

## 2022-08-22 DIAGNOSIS — I5032 Chronic diastolic (congestive) heart failure: Secondary | ICD-10-CM

## 2022-08-22 NOTE — Progress Notes (Signed)
Sleep study results received. Dr. [name] is referring pt to Dr. Mayford Knife for sleep medicine eval. Referral placed.

## 2022-08-25 ENCOUNTER — Telehealth (HOSPITAL_BASED_OUTPATIENT_CLINIC_OR_DEPARTMENT_OTHER): Payer: Medicaid Other | Admitting: Pulmonary Disease

## 2022-08-25 DIAGNOSIS — G4733 Obstructive sleep apnea (adult) (pediatric): Secondary | ICD-10-CM | POA: Diagnosis not present

## 2022-08-25 NOTE — Telephone Encounter (Signed)
Dr. [name], will your office send sleep consult. Thank you.

## 2022-08-25 NOTE — Telephone Encounter (Signed)
NPSG showed mild-mod  OSA with AHI 13.5/ hr & desat about 27 mins esp during supine/REM sleep Options include dental appliance or CPAP SUggest a follow up with sleep doc

## 2022-09-01 ENCOUNTER — Other Ambulatory Visit
Admission: RE | Admit: 2022-09-01 | Discharge: 2022-09-01 | Disposition: A | Discharge status: Home / Self Care | Payer: Medicaid Other | Source: Ambulatory Visit | Attending: Cardiology | Admitting: Cardiology

## 2022-09-01 ENCOUNTER — Encounter: Payer: Self-pay | Admitting: Cardiology

## 2022-09-01 ENCOUNTER — Ambulatory Visit (HOSPITAL_BASED_OUTPATIENT_CLINIC_OR_DEPARTMENT_OTHER): Payer: Medicaid Other | Admitting: Cardiology

## 2022-09-01 VITALS — BP 131/71 | HR 80 | Wt 148.8 lb

## 2022-09-01 DIAGNOSIS — I311 Chronic constrictive pericarditis: Secondary | ICD-10-CM

## 2022-09-01 DIAGNOSIS — I5032 Chronic diastolic (congestive) heart failure: Secondary | ICD-10-CM | POA: Insufficient documentation

## 2022-09-01 LAB — BASIC METABOLIC PANEL
Anion gap: 8 (ref 5–15)
BUN: 17 mg/dL (ref 8–23)
CO2: 30 mmol/L (ref 22–32)
Calcium: 9.6 mg/dL (ref 8.9–10.3)
Chloride: 98 mmol/L (ref 98–111)
Creatinine, Ser: 0.57 mg/dL (ref 0.44–1.00)
GFR, Estimated: >60 mL/min (ref >60)
Glucose, Bld: 105 mg/dL — ABNORMAL HIGH (ref 70–99)
Potassium: 3.7 mmol/L (ref 3.5–5.1)
Sodium: 136 mmol/L (ref 135–145)

## 2022-09-01 LAB — BRAIN NATRIURETIC PEPTIDE: B Natriuretic Peptide: 200.8 pg/mL — ABNORMAL HIGH (ref 0.0–100.0)

## 2022-09-01 MED ORDER — SPIRONOLACTONE 25 MG PO TABS: 12.5000 mg | ORAL_TABLET | Freq: Every day | ORAL | 3 refills | Status: DC

## 2022-09-01 MED ORDER — TORSEMIDE 20 MG PO TABS: 20.0000 mg | ORAL_TABLET | ORAL | Status: DC

## 2022-09-01 NOTE — Patient Instructions (Signed)
Medication Changes:  STOP Potassium  START Spironolactone 12.5 mg (1/2 tab) Daily  Decrease Torsemide to 20 mg (1 tab) every other day   Lab Work:  Labs done today, your results will be available in MyChart, we will contact you for abnormal readings.  Your physician recommends that you return for lab work in: 1-2 weeks [facility] Entrance at [facility] 1st desk on the right to check in (REGISTRATION)  Lab hours: Monday- Friday (7:30 am- 5:30 pm)  Testing/Procedures:  Your physician has requested that you have a cardiac MRI. Cardiac MRI uses a computer to create images of your heart as its beating, producing both still and moving pictures of your heart and major blood vessels. For further information please visit [email or web address]. Please follow the instruction sheet given to you today for more information.  Referrals:  none  Special Instructions // Education:  Do the following things EVERYDAY: Weigh yourself in the morning before breakfast. Write it down and keep it in a log. Take your medicines as prescribed Eat low salt foods--Limit salt (sodium) to 2000 mg per day.  Stay as active as you can everyday Limit all fluids for the day to less than 2 liters   Follow-Up in: 2 months    If you have any questions or concerns before your next appointment please send [place] a message through mychart or call our office at [phone] Monday-Friday 8 am-5 pm.   If you have an urgent need after hours on the weekend please call your Primary Cardiologist or the [facility] in [place] at [phone].

## 2022-09-01 NOTE — Progress Notes (Unsigned)
I,Kura Bethards S Elizabethanne Lusher,acting as a scribe for [name], MD.,have documented all relevant documentation on the behalf of [name], MD,as directed by  [name], MD while in the presence of [name], MD.     Established patient visit   Patient: Julia Stark   DOB: 11/03/1960   62 y.o. Female  MRN: [id number] Visit Date: 09/04/2022  Today's healthcare provider: [name], MD   Chief Complaint  Patient presents with   Diabetes   Hypertension   Hyperlipidemia   Subjective    HPI  Diabetes Mellitus Type II, follow-up  Lab Results  Component Value Date   HGBA1C 5.6 09/04/2022   HGBA1C 5.6 01/09/2022   HGBA1C 6.2 (A) 07/14/2021   Last seen for diabetes 6 months ago.  Management since then includes continuing the same treatment. Patient reports not taking metformin. She reports taking farxiga daily and doesn't see the need to take two medications for diabetes. She has also been losing a lot of weight.  She reports fair compliance with treatment. [name] not having side effects.   Home blood sugar records: fasting range: not being checked  Episodes of hypoglycemia? No    Current insulin regiment: none Most Recent Eye Exam:   --------------------------------------------------------------------------------------------------- Hypertension, follow-up  BP Readings from Last 3 Encounters:  09/04/22 126/73  09/01/22 131/71  08/01/22 126/68   Wt Readings from Last 3 Encounters:  09/04/22 147 lb 3.2 oz (66.8 kg)  09/01/22 148 lb 12.8 oz (67.5 kg)  08/01/22 155 lb 8 oz (70.5 kg)     She was last seen for hypertension 6 months ago.  BP at that visit was 115/73. Management since that visit includes no changes. She reports excellent compliance with treatment. [name] not having side effects.   Outside blood pressures are not being  checked.  --------------------------------------------------------------------------------------------------- Lipid/Cholesterol, follow-up  Last Lipid Panel: Lab Results  Component Value Date   CHOL 240 (H) 07/15/2021   LDLCALC 162 (H) 07/15/2021   HDL 44 07/15/2021   TRIG 184 (H) 07/15/2021    She was last seen for this 6 months ago.  Management since that visit includes no changes.  She reports fair compliance with treatment. [name] not having side effects.  Stopped rosuvastatin about 2 weeks ago.   Last metabolic panel Lab Results  Component Value Date   GLUCOSE 105 (H) 09/01/2022   NA 136 09/01/2022   K 3.7 09/01/2022   BUN 17 09/01/2022   CREATININE 0.57 09/01/2022   EGFR 98 02/14/2022   GFRNONAA >60 09/01/2022   CALCIUM 9.6 09/01/2022   AST 33 07/16/2022   ALT 16 07/16/2022   The 10-year ASCVD risk score (Arnett DK, et al., 2019) is: 12.7%  --------------------------------------------------------------------------------------------------- Discussed the use of AI scribe software for clinical note transcription with the patient, who gave verbal consent to proceed.  History of Present Illness   The patient, with a history of diabetes, heart failure, and high cholesterol, presents for a routine follow-up. They report having stopped taking metformin and rosuvastatin recently. The decision to stop metformin was influenced by the patient's significant weight loss of eighty pounds and the belief that they may not need it due to also taking [place] for diabetes and heart protection. The patient has also been experiencing muscle wasting, which they attribute to their heart condition.  The patient has a history of sleep issues and is currently taking Ambien. However, they report having been without it for a week and have been using  Benadryl to help with sleep during this time. They also report having sores in their nose and a constantly running nose, for which they have also  been using Benadryl.  The patient has a pacemaker due to heart issues and has stopped taking rosuvastatin due to concerns about its side effects, including joint pain. They have been experiencing muscle and joint problems, which they attribute to the rosuvastatin.  The patient also has a history of low blood counts and has been taking iron supplements. They report that their blood counts have returned to normal.       Medications: Outpatient Medications Prior to Visit  Medication Sig   apixaban (ELIQUIS) 5 MG TABS tablet Take 1 tablet (5 mg total) by mouth 2 (two) times daily.   colchicine 0.6 MG tablet TAKE 1 TABLET BY MOUTH ONCE DAILY   dapagliflozin propanediol (FARXIGA) 10 MG TABS tablet Take 1 tablet (10 mg total) by mouth daily before breakfast.   Iron, Ferrous Sulfate, 325 (65 Fe) MG TABS Take 325 mg by mouth daily.   linaclotide (LINZESS) 145 MCG CAPS capsule Take 1 capsule (145 mcg total) by mouth daily before breakfast. (Patient taking differently: Take 145 mcg by mouth daily as needed (Contipation).)   Magnesium 400 MG CAPS Take 400 mg by mouth daily at 12 noon.   omeprazole (PRILOSEC OTC) 20 MG tablet Take 20 mg by mouth in the morning and at bedtime.   spironolactone (ALDACTONE) 25 MG tablet Take 0.5 tablets (12.5 mg total) by mouth daily.   torsemide (DEMADEX) 20 MG tablet Take 1 tablet (20 mg total) by mouth every other day.   venlafaxine XR (EFFEXOR-XR) 150 MG 24 hr capsule TAKE 1 CAPSULE BY MOUTH ONCE DAILY WITH BREAKFAST   zolpidem (AMBIEN) 5 MG tablet Take 1 tablet (5 mg total) by mouth at bedtime as needed. for sleep   [DISCONTINUED] metFORMIN (GLUCOPHAGE) 1000 MG tablet Take 1 tablet (1,000 mg total) by mouth daily. (Patient not taking: Reported on 09/01/2022)   [DISCONTINUED] rosuvastatin (CRESTOR) 5 MG tablet Take 5 mg by mouth at bedtime.   No facility-administered medications prior to visit.    Review of Systems  Constitutional:  Positive for unexpected weight  change.  Eyes:  Negative for visual disturbance.  Respiratory:  Positive for shortness of breath. Negative for chest tightness.   Cardiovascular:  Negative for chest pain and leg swelling.  Neurological:  Negative for dizziness, light-headedness and headaches.       Objective    BP 126/73 (BP Location: Left Arm, Patient Position: Sitting, Cuff Size: Normal)   Pulse 78   Temp 97.9 F (36.6 C) (Temporal)   Resp 12   Ht 5\' 7"  (1.702 m)   Wt 147 lb 3.2 oz (66.8 kg)   LMP 04/13/2011   SpO2 96%   BMI 23.05 kg/m  BP Readings from Last 3 Encounters:  09/04/22 126/73  09/01/22 131/71  08/01/22 126/68   Wt Readings from Last 3 Encounters:  09/04/22 147 lb 3.2 oz (66.8 kg)  09/01/22 148 lb 12.8 oz (67.5 kg)  08/01/22 155 lb 8 oz (70.5 kg)      Physical Exam Vitals reviewed.  Constitutional:      General: [name] not in acute distress.    Appearance: Normal appearance. [name] well-developed. [name] not diaphoretic.  HENT:     Head: Normocephalic and atraumatic.  Eyes:     General: No scleral icterus.    Conjunctiva/sclera: Conjunctivae normal.  Neck:  Thyroid: No thyromegaly.  Cardiovascular:     Rate and Rhythm: Normal rate and regular rhythm.     Pulses: Normal pulses.     Heart sounds: Normal heart sounds. No murmur heard. Pulmonary:     Effort: Pulmonary effort is normal. No respiratory distress.     Breath sounds: Normal breath sounds. No wheezing, rhonchi or rales.  Musculoskeletal:     Cervical back: Neck supple.     Right lower leg: No edema.     Left lower leg: No edema.  Lymphadenopathy:     Cervical: No cervical adenopathy.  Skin:    General: Skin is warm and dry.     Findings: No rash.  Neurological:     Mental Status: [name] alert and oriented to person, place, and time. Mental status is at baseline.  Psychiatric:        Mood and Affect: Mood normal.        Behavior: Behavior normal.       Results for orders placed or performed in visit on  09/04/22  POCT glycosylated hemoglobin (Hb A1C)  Result Value Ref Range   Hemoglobin A1C 5.6 4.0 - 5.6 %   Est. average glucose Bld gHb Est-mCnc 114     Assessment & Plan     Problem List Items Addressed This Visit       Cardiovascular and Mediastinum   Hypertension associated with diabetes (HCC)    Well controlled Continue current medications Reviewed metabolic panel F/u in 6 months       Relevant Medications   rosuvastatin (CRESTOR) 5 MG tablet     Endocrine   T2DM (type 2 diabetes mellitus) (HCC) - Primary    A1c 5.6, patient has discontinued Metformin due to weight loss and is currently on Farxiga. -Discontinue Metformin. -Continue Farxiga. -Monitor for urinary tract infections due to [place]. - discussed need for eye exam - UACR today      Relevant Medications   rosuvastatin (CRESTOR) 5 MG tablet   Other Relevant Orders   POCT glycosylated hemoglobin (Hb A1C) (Completed)   Urine Microalbumin w/creat. ratio   Hyperlipidemia associated with type 2 diabetes mellitus (HCC)    Patient has discontinued Rosuvastatin due to concerns about side effects. Discussed the importance of statin therapy for prevention of heart attack and stroke. -Resume Rosuvastatin every other day. -Consider CoQ10 supplementation to mitigate potential side effects. -Check cholesterol levels today.      Relevant Medications   rosuvastatin (CRESTOR) 5 MG tablet   Other Relevant Orders   Lipid panel   Hepatic function panel     Hematopoietic and Hemostatic   Acquired thrombophilia (HCC)    2/2 a fib Continue DOAC      Other Visit Diagnoses     Breast cancer screening by mammogram       Relevant Orders   MM 3D SCREENING MAMMOGRAM BILATERAL BREAST         General Health Maintenance: -Order mammogram. -Plan for physical exam in six months.        Return in about 6 months (around 03/06/2023) for CPE.      I, [name], MD, have reviewed all documentation for this  visit. The documentation on 09/04/22 for the exam, diagnosis, procedures, and orders are all accurate and complete.   Bac[place]al[place], Marzella Schlein, MD, MPH [facility]

## 2022-09-03 NOTE — Progress Notes (Signed)
PCP: [name], MD EP: Dr Maisie Fus ([facility]) HF Cardiology: Dr. [name]  62 y.o. with history of permanent atrial fibrillation, diastolic CHF, and diabetes was referred for CHF MD evaluation by [name]. Patient has a long history of atrial fibrillation.  She has failed multiple anti-arrhythmic agents (flecainide, amiodarone, dronedarone, and propafenone).  She had an initial ablation in 5/21, then a redo ablation in 8/22.  At the time of 8/22 ablation, foci of atrial tachycardia were noted that were not ablated due to proximity to the AV node.  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  She had ongoing atrial arrhythmia episodes and underwent convergent endocardial/epicardial ablation at [facility] in 9/23 with left atrial appendage clipping.  This was complicated by ileus and then left hemothorax.  She developed a recurrent left pleural effusion and ended up with left VATS in 10/23.  Atrial fibrillation recurred, and ultimately, she had AV nodal ablation with [facility] [employer] DDD pacemaker with left bundle lead placed.    Since her surgery, she has had on and off pain radiating from left lower chest to upper back.  Pain is pleuritic.  She was started on colchicine which has helped.   She has been chronically short of breath since her surgery in 9/23.  Hemodynamic RHC/LHC in 4/24 showed no evidence for ventricular interdependence.  This was not suggestive of constrictive pericarditis.  Echo in 4/24 showed EF 50-55%, moderate RV dysfunction, moderate RV enlargement, PASP 46 mmHg, mild-moderate TR; no evidence for constrictive pericarditis.   She returns for followup of CHF.  She stopped Crestor due to myalgias.  Still has pain "under rib cage" bilaterally that is pleuritic.  Improved with colchicine but not totally resolved. Still hard to take a deep breath.  Still sleeps on 3 pillows.  Weight is down 15 lbs, less short of breath but still dyspneic walking up stairs or  inclines.  No dyspnea on flat ground.    Labs (3/24): K 4.1, creatinine 0.55, BNP 299 Labs (4/24): K 3.5, creatinine 0.65, BNP 339  PMH: 1. Atrial fibrillation/atrial flutter:  Now permanent. Tried and failed flecainide, amiodarone, dronedarone, and propafenone.  - AF ablation 5/21.   - AF ablation 8/22.  - Convergent epicardial/endocardial ablation with LA appendage clipping in 9/23.  Complicated by left hemothorax and then recurrent left pleural effusion, had left VATS in 10/23.  - AV nodal ablation with [employer] DDD pacemaker with left bundle lead placed 2/24.  2. Atrial tachycardia: Noted at time of AF ablation in 8/22.  Not ablated, near AV node.  3. 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  4. Chronic diastolic CHF: Cardiac MRI in 2/21 showed EF 61%, normal RV, no LGE.  - Cardiolite (3/22): No ischemia/infarction.  - Echo (10/23) with EF 50%, normal RV, moderate TR.  - Echo (4/24): EF 50-55%, moderate RV dysfunction, moderate RV enlargement, PASP 46 mmHg, mild-moderate TR; no evidence for constrictive pericarditis.  - Hemodynamic LHC/RHC (4/24): mean RA 6, PA 54/27 mean 31, mean PCWP 9, CI 3.51, PVR 3.5 WU; no ventricular interdependence noted.  5. HTN 6. Type 2 diabetes 7. Depression 8. Hyperlipidemia 9. OSA: Mild-moderate  Social History   Socioeconomic History   Marital status: Divorced    Spouse name: Not on file   Number of children: 3   Years of education: 12   Highest education level: High school graduate  Occupational History    Employer: [employer]    Comment: in  special chemistry  Tobacco Use   Smoking status: Never   Smokeless tobacco: Never  Vaping Use   Vaping Use: Never used  Substance and Sexual Activity   Alcohol use: Yes    Alcohol/week: 4.0 standard drinks of alcohol    Types: 2 Glasses of wine, 2 Cans of beer per week   Drug use: No   Sexual activity: Yes    Partners: Male    Birth control/protection: Post-menopausal, Surgical   Other Topics Concern   Not on file  Social History Narrative   Not on file   Social Determinants of Health   Financial Resource Strain: Not on file  Food Insecurity: Not on file  Transportation Needs: Not on file  Physical Activity: Not on file  Stress: Not on file  Social Connections: Not on file  Intimate Partner Violence: Not on file   Family History  Problem Relation Age of Onset   Diabetes Mother    Hypertension Mother    Skin cancer Mother    Depression Mother    Alzheimer's disease Father    Diabetes Sister    Depression Sister    Diabetes Brother    Colon cancer Maternal Uncle    Hypertension Maternal Grandfather    Hyperlipidemia Maternal Grandfather    Diabetes Paternal Grandmother    Heart disease Paternal Grandmother    Prostate cancer Paternal Grandfather    Ovarian cancer Cousin    Breast cancer Neg Hx    Cervical cancer Neg Hx    ROS: All systems reviewed and negative except as per HPI.   Current Outpatient Medications  Medication Sig Dispense Refill   apixaban (ELIQUIS) 5 MG TABS tablet Take 1 tablet (5 mg total) by mouth 2 (two) times daily. 60 tablet 6   colchicine 0.6 MG tablet TAKE 1 TABLET BY MOUTH ONCE DAILY 60 tablet 2   dapagliflozin propanediol (FARXIGA) 10 MG TABS tablet Take 1 tablet (10 mg total) by mouth daily before breakfast. 30 tablet 3   Iron, Ferrous Sulfate, 325 (65 Fe) MG TABS Take 325 mg by mouth daily. 30 tablet 5   linaclotide (LINZESS) 145 MCG CAPS capsule Take 1 capsule (145 mcg total) by mouth daily before breakfast. (Patient taking differently: Take 145 mcg by mouth daily as needed (Contipation).) 90 capsule 1   Magnesium 400 MG CAPS Take 400 mg by mouth daily at 12 noon.     omeprazole (PRILOSEC OTC) 20 MG tablet Take 20 mg by mouth in the morning and at bedtime.     spironolactone (ALDACTONE) 25 MG tablet Take 0.5 tablets (12.5 mg total) by mouth daily. 15 tablet 3   venlafaxine XR (EFFEXOR-XR) 150 MG 24 hr capsule TAKE 1  CAPSULE BY MOUTH ONCE DAILY WITH BREAKFAST 90 capsule 1   zolpidem (AMBIEN) 5 MG tablet Take 1 tablet (5 mg total) by mouth at bedtime as needed. for sleep (Patient taking differently: Take 5 mg by mouth at bedtime. for sleep) 30 tablet 5   metFORMIN (GLUCOPHAGE) 1000 MG tablet Take 1 tablet (1,000 mg total) by mouth daily. (Patient not taking: Reported on 09/01/2022) 90 tablet 1   rosuvastatin (CRESTOR) 5 MG tablet Take 5 mg by mouth at bedtime. (Patient not taking: Reported on 09/01/2022)     torsemide (DEMADEX) 20 MG tablet Take 1 tablet (20 mg total) by mouth every other day.     No current facility-administered medications for this visit.   BP 131/71 (BP Location: Right Arm, Patient Position: Sitting)  Pulse 80   Wt 148 lb 12.8 oz (67.5 kg)   LMP 04/13/2011   SpO2 97%   BMI 23.31 kg/m  General: NAD Neck: JVP 8 cm, no thyromegaly or thyroid nodule.  Lungs: Clear to auscultation bilaterally with normal respiratory effort. CV: Nondisplaced PMI.  Heart regular S1/S2, no S3/S4, no murmur.  No peripheral edema.  No carotid bruit.  Normal pedal pulses.  Abdomen: Soft, nontender, no hepatosplenomegaly, no distention.  Skin: Intact without lesions or rashes.  Neurologic: Alert and oriented x 3.  Psych: Normal affect. Extremities: No clubbing or cyanosis.  HEENT: Normal.   Assessment/Plan: 1. Chronic diastolic CHF: Echo (10/23) with EF 50%, normal RV, moderate TR.  She has had pericardial-type symptoms with pleuritic chest pain on and off since 9/23 convergent procedure.  She had a left-sided VATS because of persistent pleural effusion. [name] currently on colchicine, says it has helped her chest pain but has not resolved it.  I have been concerned that she could have constrictive pericarditis post-cardiac surgery. However, echo in 4/24 showed EF 50-55%, moderate RV dysfunction, moderate RV enlargement, PASP 46 mmHg, mild-moderate TR; no evidence for constrictive pericarditis.  Hemodynamic  LHC/RHC showed normal filling pressures with mild-moderate pulmonary hypertension and no evidence for ventricular interdependence.  So far, imaging has not been consistent with constrictive pericarditis.  Situation looks more like RV failure.  She remains symptomatic, NYHA class III.  Not volume overloaded on exam.  - I will go ahead with a cardiac MRI to assess RV and also assess a different way for constrictive pericarditis which is still a concern with her clinical picture.  - She has been taking torsemide 20 mg every other day and looks euvolemic.  Continue this dosing.   - Start spironolactone 12.5 mg daily and can stay off KCl. BMET/BNP today, BMET in 10 days.  - Continue Farxiga 10 mg daily.  - I will have her continue colchicine for now while still having some pleuritic chest pain.  Check CBC.  2. OSA: Mild-moderate.   - Has followup with Dr. Mayford Knife to get CPAP.  3. Atrial arrhythmias: She has history of atrial fibrillation, atrial flutter, atrial tachycardia, and AVNRT. Now with permanent atrial fibrillation despite 2 endocardial ablations and then a convergent endocardial/epicardial ablation.  She has failed multiple anti-arrhythmics as noted above. Now s/p AV nodal ablation with [facility] Scientific CRT-D device.  - Continue apixaban.  4. Pulmonary hypertension: Mild-moderate with PVR 3.5 WU on 4/24 RHC.  She has OSA which may contribute.  Has significant RV failure.  - Will need to start CPAP.   - Do not think pulmonary vasodilators would be helpful.  Followup 2 months.   Marca Ancona 09/03/2022

## 2022-09-04 ENCOUNTER — Ambulatory Visit (INDEPENDENT_AMBULATORY_CARE_PROVIDER_SITE_OTHER): Payer: Medicaid Other | Admitting: Family Medicine

## 2022-09-04 ENCOUNTER — Other Ambulatory Visit: Payer: Self-pay | Admitting: Family Medicine

## 2022-09-04 ENCOUNTER — Encounter: Payer: Self-pay | Admitting: Family Medicine

## 2022-09-04 ENCOUNTER — Telehealth (HOSPITAL_COMMUNITY): Payer: Self-pay | Admitting: *Deleted

## 2022-09-04 VITALS — BP 126/73 | HR 78 | Temp 97.9°F | Resp 12 | Ht 67.0 in | Wt 147.2 lb

## 2022-09-04 DIAGNOSIS — I152 Hypertension secondary to endocrine disorders: Secondary | ICD-10-CM

## 2022-09-04 DIAGNOSIS — E1169 Type 2 diabetes mellitus with other specified complication: Secondary | ICD-10-CM | POA: Diagnosis not present

## 2022-09-04 DIAGNOSIS — D6869 Other thrombophilia: Secondary | ICD-10-CM | POA: Diagnosis not present

## 2022-09-04 DIAGNOSIS — Z1231 Encounter for screening mammogram for malignant neoplasm of breast: Secondary | ICD-10-CM | POA: Diagnosis not present

## 2022-09-04 DIAGNOSIS — E1159 Type 2 diabetes mellitus with other circulatory complications: Secondary | ICD-10-CM

## 2022-09-04 DIAGNOSIS — E785 Hyperlipidemia, unspecified: Secondary | ICD-10-CM | POA: Diagnosis not present

## 2022-09-04 LAB — POCT GLYCOSYLATED HEMOGLOBIN (HGB A1C)
Est. average glucose Bld gHb Est-mCnc: 114
Hemoglobin A1C: 5.6 % (ref 4.0–5.6)

## 2022-09-04 MED ORDER — ROSUVASTATIN CALCIUM 5 MG PO TABS: 5.0000 mg | ORAL_TABLET | ORAL | 1 refills | Status: DC

## 2022-09-04 MED ORDER — ZOLPIDEM TARTRATE 5 MG PO TABS: 5.0000 mg | ORAL_TABLET | Freq: Every evening | ORAL | 5 refills | Status: DC | PRN

## 2022-09-04 NOTE — Assessment & Plan Note (Signed)
2/2 a fib ?Continue DOAC ?

## 2022-09-04 NOTE — Assessment & Plan Note (Signed)
Patient has discontinued Rosuvastatin due to concerns about side effects. Discussed the importance of statin therapy for prevention of heart attack and stroke. -Resume Rosuvastatin every other day. -Consider CoQ10 supplementation to mitigate potential side effects. -Check cholesterol levels today.

## 2022-09-04 NOTE — Telephone Encounter (Signed)
Order ID: [id number]       Authorized  Approval Valid Through: 09/04/2022 - 11/02/2022  [facility] Berkley Harvey

## 2022-09-04 NOTE — Assessment & Plan Note (Signed)
A1c 5.6, patient has discontinued Metformin due to weight loss and is currently on Farxiga. -Discontinue Metformin. -Continue Farxiga. -Monitor for urinary tract infections due to [place]. - discussed need for eye exam - UACR today

## 2022-09-04 NOTE — Assessment & Plan Note (Signed)
Well controlled Continue current medications Reviewed metabolic panel F/u in 6 months  

## 2022-09-04 NOTE — Telephone Encounter (Signed)
[facility] is requesting prescription refill  zolpidem (AMBIEN) 5 MG tablet  Please advise

## 2022-09-05 ENCOUNTER — Other Ambulatory Visit (HOSPITAL_COMMUNITY): Payer: Self-pay

## 2022-09-05 ENCOUNTER — Telehealth: Payer: Self-pay

## 2022-09-05 DIAGNOSIS — G4733 Obstructive sleep apnea (adult) (pediatric): Secondary | ICD-10-CM

## 2022-09-05 LAB — LIPID PANEL
Chol/HDL Ratio: 3.9 ratio (ref 0.0–4.4)
Cholesterol, Total: 192 mg/dL (ref 100–199)
HDL: 49 mg/dL (ref >39)
LDL Chol Calc (NIH): 111 mg/dL — ABNORMAL HIGH (ref 0–99)
Triglycerides: 181 mg/dL — ABNORMAL HIGH (ref 0–149)
VLDL Cholesterol Cal: 32 mg/dL (ref 5–40)

## 2022-09-05 LAB — HEPATIC FUNCTION PANEL
ALT: 33 IU/L — ABNORMAL HIGH (ref 0–32)
AST: 43 IU/L — ABNORMAL HIGH (ref 0–40)
Albumin: 4.4 g/dL (ref 3.9–4.9)
Alkaline Phosphatase: 303 IU/L — ABNORMAL HIGH (ref 44–121)
Bilirubin Total: 0.6 mg/dL (ref 0.0–1.2)
Bilirubin, Direct: 0.23 mg/dL (ref 0.00–0.40)
Total Protein: 8.3 g/dL (ref 6.0–8.5)

## 2022-09-05 LAB — MICROALBUMIN / CREATININE URINE RATIO
Creatinine, Urine: 71.9 mg/dL
Microalb/Creat Ratio: 81 mg/g creat — ABNORMAL HIGH (ref 0–29)
Microalbumin, Urine: 57.9 ug/mL

## 2022-09-05 NOTE — Telephone Encounter (Signed)
-----   Message from [name], MD sent at 09/05/2022  8:06 AM EDT ----- Cholesterol is improving. Taking Crestor every other day as we discussed.  Livre function tests are abnormal.  Recommend adding on Alk phos fractionation, Acute Hepatitis panel, and GGT (CMAs please call the lab to add on).  Pending results will consider imaging of the liver. Can also follow-up with her GI.

## 2022-09-05 NOTE — Telephone Encounter (Signed)
Called [facility] to add additional test  acute hepatitis panel 144000 Alkaline phos [id number] Gamma GT [id number]

## 2022-09-06 LAB — ALKALINE PHOSPHATASE, ISOENZYMES

## 2022-09-08 ENCOUNTER — Encounter: Payer: Self-pay | Admitting: Family Medicine

## 2022-09-08 LAB — ALKALINE PHOSPHATASE, ISOENZYMES

## 2022-09-09 ENCOUNTER — Other Ambulatory Visit: Payer: Self-pay | Admitting: Family Medicine

## 2022-09-09 ENCOUNTER — Encounter: Payer: Self-pay | Admitting: Family Medicine

## 2022-09-10 LAB — ALKALINE PHOSPHATASE, ISOENZYMES: INTESTINAL FRAC.: 1 % (ref 0–18)

## 2022-09-10 LAB — SPECIMEN STATUS REPORT

## 2022-09-10 LAB — GAMMA GT: GGT: 477 IU/L — ABNORMAL HIGH (ref 0–60)

## 2022-09-11 ENCOUNTER — Other Ambulatory Visit: Payer: Self-pay

## 2022-09-11 ENCOUNTER — Telehealth: Payer: Self-pay

## 2022-09-11 DIAGNOSIS — R7989 Other specified abnormal findings of blood chemistry: Secondary | ICD-10-CM

## 2022-09-12 ENCOUNTER — Telehealth: Payer: Self-pay

## 2022-09-12 ENCOUNTER — Encounter: Payer: Self-pay | Admitting: Internal Medicine

## 2022-09-12 LAB — ACUTE VIRAL HEPATITIS (HAV, HBV, HCV)

## 2022-09-12 NOTE — Telephone Encounter (Signed)
Fax received for Long-Term Disability (LTD) claim from [employer].  Forms completed and faxed to [phone].

## 2022-09-13 ENCOUNTER — Other Ambulatory Visit: Payer: Self-pay | Admitting: Family Medicine

## 2022-09-13 DIAGNOSIS — R7989 Other specified abnormal findings of blood chemistry: Secondary | ICD-10-CM

## 2022-09-13 NOTE — Telephone Encounter (Signed)
Please begin Motegrity 2 mg daily for chronic constipation and esophageal dysmotility Discontinue Linzess as Motegrity will be the new medication to work for her chronic idiopathic constipation

## 2022-09-14 ENCOUNTER — Other Ambulatory Visit: Payer: Self-pay

## 2022-09-14 LAB — ACUTE VIRAL HEPATITIS (HAV, HBV, HCV): Hep A IgM: NEGATIVE

## 2022-09-14 LAB — ALKALINE PHOSPHATASE, ISOENZYMES: Alkaline Phosphatase: 293 IU/L — ABNORMAL HIGH (ref 44–121)

## 2022-09-14 MED ORDER — MOTEGRITY 2 MG PO TABS: 2.0000 mg | ORAL_TABLET | Freq: Every day | ORAL | 3 refills | Status: DC

## 2022-09-15 ENCOUNTER — Other Ambulatory Visit
Admission: RE | Admit: 2022-09-15 | Discharge: 2022-09-15 | Disposition: A | Discharge status: Home / Self Care | Payer: Medicaid Other | Source: Ambulatory Visit | Attending: Cardiology | Admitting: Cardiology

## 2022-09-15 DIAGNOSIS — I5032 Chronic diastolic (congestive) heart failure: Secondary | ICD-10-CM

## 2022-09-15 LAB — BASIC METABOLIC PANEL
Anion gap: 13 (ref 5–15)
BUN: 21 mg/dL (ref 8–23)
CO2: 30 mmol/L (ref 22–32)
Calcium: 10.1 mg/dL (ref 8.9–10.3)
Chloride: 96 mmol/L — ABNORMAL LOW (ref 98–111)
Creatinine, Ser: 0.54 mg/dL (ref 0.44–1.00)
GFR, Estimated: >60 mL/min (ref >60)
Glucose, Bld: 115 mg/dL — ABNORMAL HIGH (ref 70–99)
Potassium: 3.4 mmol/L — ABNORMAL LOW (ref 3.5–5.1)
Sodium: 139 mmol/L (ref 135–145)

## 2022-09-17 ENCOUNTER — Encounter: Payer: Self-pay | Admitting: Cardiology

## 2022-09-18 ENCOUNTER — Other Ambulatory Visit
Admission: RE | Admit: 2022-09-18 | Discharge: 2022-09-18 | Disposition: A | Discharge status: Home / Self Care | Payer: Medicaid Other | Attending: Family Medicine | Admitting: Family Medicine

## 2022-09-18 ENCOUNTER — Encounter: Payer: Self-pay | Admitting: Family

## 2022-09-18 ENCOUNTER — Telehealth: Payer: Self-pay

## 2022-09-18 DIAGNOSIS — I5032 Chronic diastolic (congestive) heart failure: Secondary | ICD-10-CM | POA: Diagnosis not present

## 2022-09-18 LAB — BASIC METABOLIC PANEL
Anion gap: 9 (ref 5–15)
BUN: 20 mg/dL (ref 8–23)
CO2: 29 mmol/L (ref 22–32)
Calcium: 9.4 mg/dL (ref 8.9–10.3)
Chloride: 97 mmol/L — ABNORMAL LOW (ref 98–111)
Creatinine, Ser: 0.42 mg/dL — ABNORMAL LOW (ref 0.44–1.00)
GFR, Estimated: >60 mL/min (ref >60)
Glucose, Bld: 153 mg/dL — ABNORMAL HIGH (ref 70–99)
Potassium: 3.4 mmol/L — ABNORMAL LOW (ref 3.5–5.1)
Sodium: 135 mmol/L (ref 135–145)

## 2022-09-18 NOTE — Telephone Encounter (Signed)
Per Prince Rome, FNP, BMET was ordered on patient. Spoke with patient and informed that we needed labs completed, patient verbalized understanding.

## 2022-09-19 NOTE — Telephone Encounter (Signed)
U/s scheduled for 09/21/22

## 2022-09-21 ENCOUNTER — Ambulatory Visit
Admission: RE | Admit: 2022-09-21 | Discharge: 2022-09-21 | Disposition: A | Discharge status: Home / Self Care | Payer: Medicaid Other | Source: Ambulatory Visit | Attending: Family Medicine | Admitting: Family Medicine

## 2022-09-21 DIAGNOSIS — Z1231 Encounter for screening mammogram for malignant neoplasm of breast: Secondary | ICD-10-CM | POA: Diagnosis not present

## 2022-09-21 DIAGNOSIS — R7989 Other specified abnormal findings of blood chemistry: Secondary | ICD-10-CM | POA: Insufficient documentation

## 2022-09-21 DIAGNOSIS — R748 Abnormal levels of other serum enzymes: Secondary | ICD-10-CM | POA: Diagnosis not present

## 2022-09-21 NOTE — Progress Notes (Signed)
Normal [place]. Continue to recommend balanced, lower carb meals. Smaller meal size, adding snacks. Choosing water as drink of choice and increasing purposeful exercise.

## 2022-09-22 ENCOUNTER — Other Ambulatory Visit (HOSPITAL_COMMUNITY): Payer: Self-pay

## 2022-09-22 ENCOUNTER — Telehealth: Payer: Self-pay | Admitting: Pharmacy Technician

## 2022-09-22 NOTE — Telephone Encounter (Signed)
Patient Advocate Encounter  Prior Authorization for MOTEGRITY 2MG  has been approved with [employer].    PA# [id number] Effective dates: 6.21.24 through 6.21.25  Per [facility] test claim, copay for 30 days supply is $4   Received notification from [employer] that prior authorization for MOTEGRITY 2MG   is required.   PA submitted on 6.21.24 Key [id number] Status is pending

## 2022-09-22 NOTE — Telephone Encounter (Signed)
PA has been submitted EXPEDITED, and telephone encounter has been created.  PA is APPROVED and can now be filled at pharmacy level

## 2022-09-25 ENCOUNTER — Encounter: Payer: Self-pay | Admitting: Cardiology

## 2022-09-25 ENCOUNTER — Telehealth: Payer: Self-pay

## 2022-09-25 DIAGNOSIS — I5032 Chronic diastolic (congestive) heart failure: Secondary | ICD-10-CM

## 2022-09-25 MED ORDER — SPIRONOLACTONE 25 MG PO TABS: 25.0000 mg | ORAL_TABLET | Freq: Every day | ORAL | 3 refills | Status: DC

## 2022-09-25 NOTE — Telephone Encounter (Signed)
Pt sent msg to clinic regarding refill of spironolactone and blood work. Per Dr. [name] to place orders for lab work and give refills.  Order placed. Refill completed.

## 2022-09-25 NOTE — Addendum Note (Signed)
Addended by: Electa Sniff on: 09/25/2022 11:52 AM   Modules accepted: Orders

## 2022-10-02 ENCOUNTER — Ambulatory Visit: Payer: Medicaid Other | Admitting: Family Medicine

## 2022-10-02 VITALS — BP 118/68 | HR 80 | Temp 97.8°F | Resp 16 | Ht 67.0 in | Wt 138.7 lb

## 2022-10-02 DIAGNOSIS — I483 Typical atrial flutter: Secondary | ICD-10-CM | POA: Diagnosis not present

## 2022-10-02 DIAGNOSIS — I509 Heart failure, unspecified: Secondary | ICD-10-CM | POA: Diagnosis not present

## 2022-10-02 DIAGNOSIS — I480 Paroxysmal atrial fibrillation: Secondary | ICD-10-CM

## 2022-10-02 DIAGNOSIS — E119 Type 2 diabetes mellitus without complications: Secondary | ICD-10-CM | POA: Diagnosis not present

## 2022-10-02 DIAGNOSIS — D649 Anemia, unspecified: Secondary | ICD-10-CM

## 2022-10-02 NOTE — Progress Notes (Signed)
I,Sulibeya S Dimas,acting as a scribe for [name], MD.,have documented all relevant documentation on the behalf of [name], MD,as directed by  [name], MD while in the presence of [name], MD.     Established patient visit   Patient: Julia Stark   DOB: November 23, 1960   62 y.o. Female  MRN: [id number] Visit Date: 10/02/2022  Today's healthcare provider: [name], MD   Chief Complaint  Patient presents with   long-term disability   Subjective    HPI  Patient requesting form to be filled out for long term disability. Patient reports she is still not able to work. Patient reports she can't breath and has lost 85 lbs. She reports she is not well.   Patient reports a knot on her tailbone x 1 year. She reports pain with sitting. She denies any redness or discharge.   Discussed the use of AI scribe software for clinical note transcription with the patient, who gave verbal consent to proceed.  History of Present Illness   The patient, who has a history of atrial fibrillation (AFib), atrial flutter, CHF, and diabetes, presents with significant weight loss, difficulty eating, and decreased strength. The patient reports being unable to perform daily activities due to shortness of breath, fatigue, and lack of exertional capacity. The patient also reports needing assistance with dressing due to the time and energy it takes to complete this task. The patient has been on long-term disability for two years and is currently in the process of applying for Social Security Disability.  The patient also reports a significant decrease in her ability to eat, leading to a weight loss of 85 pounds. The patient describes feeling full after only a few bites of food and has stopped taking prescribed medication due to stomach pain.  The patient also reports decreased strength, particularly in the upper and lower extremities. The patient has attempted chair yoga for  exercise but reports needing to stop frequently due to fatigue. The patient also reports difficulty with balance and dizziness.  The patient has a pacemaker due to her heart conditions and reports a change in voice and a noticeable protrusion of the pacemaker due to weight loss. The patient also reports a noticeable asymmetry in her lower back, with more fat and muscle loss on one side.       Medications: Outpatient Medications Prior to Visit  Medication Sig   apixaban (ELIQUIS) 5 MG TABS tablet Take 1 tablet (5 mg total) by mouth 2 (two) times daily.   cholecalciferol (VITAMIN D3) 25 MCG (1000 UNIT) tablet Take 1,000 Units by mouth daily.   dapagliflozin propanediol (FARXIGA) 10 MG TABS tablet Take 1 tablet (10 mg total) by mouth daily before breakfast.   Iron, Ferrous Sulfate, 325 (65 Fe) MG TABS Take 325 mg by mouth daily.   Magnesium 400 MG CAPS Take 400 mg by mouth daily at 12 noon.   omeprazole (PRILOSEC OTC) 20 MG tablet Take 20 mg by mouth in the morning and at bedtime.   rosuvastatin (CRESTOR) 5 MG tablet Take 1 tablet (5 mg total) by mouth every other day.   spironolactone (ALDACTONE) 25 MG tablet Take 1 tablet (25 mg total) by mouth daily.   torsemide (DEMADEX) 20 MG tablet Take 1 tablet (20 mg total) by mouth every other day.   venlafaxine XR (EFFEXOR-XR) 150 MG 24 hr capsule TAKE 1 CAPSULE BY MOUTH ONCE DAILY WITH BREAKFAST   zolpidem (AMBIEN) 5 MG tablet Take 1 tablet (  5 mg total) by mouth at bedtime as needed. for sleep   [DISCONTINUED] colchicine 0.6 MG tablet TAKE 1 TABLET BY MOUTH ONCE DAILY   [DISCONTINUED] Prucalopride Succinate (MOTEGRITY) 2 MG TABS Take 1 tablet (2 mg total) by mouth daily. (Patient not taking: Reported on 10/02/2022)   No facility-administered medications prior to visit.    Review of Systems per HPI     Objective    BP 118/68 (BP Location: Left Arm, Patient Position: Sitting, Cuff Size: Normal)   Pulse 80   Temp 97.8 F (36.6 C) (Temporal)    Resp 16   Ht 5\' 7"  (1.702 m)   Wt 138 lb 11.2 oz (62.9 kg)   LMP 04/13/2011   SpO2 95%   BMI 21.72 kg/m    Physical Exam Vitals reviewed.  Constitutional:      General: [name] not in acute distress.    Appearance: Normal appearance. [name] well-developed. [name] not diaphoretic.  HENT:     Head: Normocephalic and atraumatic.  Eyes:     General: No scleral icterus.    Conjunctiva/sclera: Conjunctivae normal.  Neck:     Thyroid: No thyromegaly.  Cardiovascular:     Rate and Rhythm: Normal rate and regular rhythm.     Heart sounds: Normal heart sounds. No murmur heard. Pulmonary:     Effort: Pulmonary effort is normal. No respiratory distress.     Breath sounds: Normal breath sounds. No wheezing, rhonchi or rales.  Musculoskeletal:     Cervical back: Neck supple.     Right lower leg: No edema.     Left lower leg: No edema.  Lymphadenopathy:     Cervical: No cervical adenopathy.  Skin:    General: Skin is warm and dry.     Findings: No rash.  Neurological:     Mental Status: [name] alert and oriented to person, place, and time. Mental status is at baseline.  Psychiatric:        Mood and Affect: Mood normal.        Behavior: Behavior normal.      Physical Exam   MEASUREMENTS: WT- 138 pounds MUSCULOSKELETAL: Strength 4 out of 5 in upper and lower extremities.       No results found for any visits on 10/02/22.  Assessment & Plan     Problem List Items Addressed This Visit       Cardiovascular and Mediastinum   Paroxysmal A-fib (HCC)   Typical atrial flutter (HCC) - Primary     Endocrine   T2DM (type 2 diabetes mellitus) (HCC)     Other   Anemia   Other Visit Diagnoses     Chronic congestive heart failure, unspecified heart failure type (HCC)              Atrial Fibrillation and Flutter: Patient reports significant shortness of breath, fatigue, and lack of exertional capacity. Recent procedures at [facility] did not improve symptoms and patient now has  congestive heart failure. -Continue current management under cardiology.  Unintentional Weight Loss: Patient reports significant weight loss (85 pounds) and early satiety. Patient has stopped taking prescribed GI medication (Motegrity) due to stomach pain. -Refer to GI for further evaluation and management.  Diabetes: Managed by current provider. -Continue current management.  Post-Surgical Complications: Patient reports complications following convergent ablation procedure, including a collapsed lung and voice changes. -Continue follow-up with cardiology and pulmonology as needed.  Muscle Weakness and Deconditioning: Patient reports difficulty with activities of daily living  due to weakness and fatigue. -Consider referral for physical therapy.  General Health Maintenance: -Encourage patient to schedule diabetic eye exam. -Follow-up in December or sooner if needed.   LTD forms completed       Return for as scheduled.      I, [name], MD, have reviewed all documentation for this visit. The documentation on 10/02/22 for the exam, diagnosis, procedures, and orders are all accurate and complete.   Mekaylah Klich, Marzella Schlein, MD, MPH [facility]

## 2022-10-03 ENCOUNTER — Telehealth: Payer: Self-pay

## 2022-10-03 NOTE — Telephone Encounter (Signed)
  Fax received for Long-Term Disability claim form [employer]. Forms completed and faxed to [phone]

## 2022-10-03 NOTE — Telephone Encounter (Signed)
Okay, stop Motegrity and go back to Linzess at previous dose as needed.  This is how she was using it previously

## 2022-10-19 ENCOUNTER — Encounter: Payer: Self-pay | Admitting: Cardiology

## 2022-10-19 ENCOUNTER — Other Ambulatory Visit: Payer: Self-pay

## 2022-10-19 MED ORDER — DAPAGLIFLOZIN PROPANEDIOL 10 MG PO TABS: 10.0000 mg | ORAL_TABLET | Freq: Every day | ORAL | 6 refills | Status: DC

## 2022-10-22 ENCOUNTER — Emergency Department (HOSPITAL_COMMUNITY): Payer: Medicaid Other

## 2022-10-22 ENCOUNTER — Encounter (HOSPITAL_COMMUNITY): Payer: Self-pay

## 2022-10-22 ENCOUNTER — Emergency Department (HOSPITAL_COMMUNITY)
Admission: EM | Admit: 2022-10-22 | Discharge: 2022-10-22 | Disposition: A | Discharge status: Home / Self Care | Payer: Medicaid Other | Attending: Emergency Medicine | Admitting: Emergency Medicine

## 2022-10-22 ENCOUNTER — Other Ambulatory Visit: Payer: Self-pay

## 2022-10-22 ENCOUNTER — Telehealth: Payer: Self-pay | Admitting: Nurse Practitioner

## 2022-10-22 DIAGNOSIS — Z79899 Other long term (current) drug therapy: Secondary | ICD-10-CM | POA: Insufficient documentation

## 2022-10-22 DIAGNOSIS — Z950 Presence of cardiac pacemaker: Secondary | ICD-10-CM | POA: Diagnosis not present

## 2022-10-22 DIAGNOSIS — J984 Other disorders of lung: Secondary | ICD-10-CM | POA: Diagnosis not present

## 2022-10-22 DIAGNOSIS — R634 Abnormal weight loss: Secondary | ICD-10-CM | POA: Insufficient documentation

## 2022-10-22 DIAGNOSIS — Z7901 Long term (current) use of anticoagulants: Secondary | ICD-10-CM | POA: Insufficient documentation

## 2022-10-22 DIAGNOSIS — I110 Hypertensive heart disease with heart failure: Secondary | ICD-10-CM | POA: Diagnosis not present

## 2022-10-22 DIAGNOSIS — R071 Chest pain on breathing: Secondary | ICD-10-CM | POA: Insufficient documentation

## 2022-10-22 DIAGNOSIS — R1310 Dysphagia, unspecified: Secondary | ICD-10-CM | POA: Insufficient documentation

## 2022-10-22 DIAGNOSIS — Z1152 Encounter for screening for COVID-19: Secondary | ICD-10-CM | POA: Diagnosis not present

## 2022-10-22 DIAGNOSIS — I4891 Unspecified atrial fibrillation: Secondary | ICD-10-CM | POA: Insufficient documentation

## 2022-10-22 DIAGNOSIS — J90 Pleural effusion, not elsewhere classified: Secondary | ICD-10-CM | POA: Diagnosis not present

## 2022-10-22 DIAGNOSIS — I509 Heart failure, unspecified: Secondary | ICD-10-CM | POA: Diagnosis not present

## 2022-10-22 DIAGNOSIS — E119 Type 2 diabetes mellitus without complications: Secondary | ICD-10-CM | POA: Diagnosis not present

## 2022-10-22 DIAGNOSIS — R0602 Shortness of breath: Secondary | ICD-10-CM | POA: Diagnosis not present

## 2022-10-22 DIAGNOSIS — R6881 Early satiety: Secondary | ICD-10-CM | POA: Diagnosis not present

## 2022-10-22 DIAGNOSIS — R0789 Other chest pain: Secondary | ICD-10-CM | POA: Diagnosis not present

## 2022-10-22 LAB — RESP PANEL BY RT-PCR (RSV, FLU A&B, COVID)  RVPGX2
Influenza A by PCR: NEGATIVE
Influenza B by PCR: NEGATIVE
Resp Syncytial Virus by PCR: NEGATIVE
SARS Coronavirus 2 by RT PCR: NEGATIVE

## 2022-10-22 LAB — COMPREHENSIVE METABOLIC PANEL
ALT: 26 U/L (ref 0–44)
AST: 38 U/L (ref 15–41)
Albumin: 3.8 g/dL (ref 3.5–5.0)
Alkaline Phosphatase: 191 U/L — ABNORMAL HIGH (ref 38–126)
Anion gap: 10 (ref 5–15)
BUN: 19 mg/dL (ref 8–23)
CO2: 29 mmol/L (ref 22–32)
Calcium: 9.9 mg/dL (ref 8.9–10.3)
Chloride: 96 mmol/L — ABNORMAL LOW (ref 98–111)
Creatinine, Ser: 0.55 mg/dL (ref 0.44–1.00)
GFR, Estimated: >60 mL/min (ref >60)
Glucose, Bld: 134 mg/dL — ABNORMAL HIGH (ref 70–99)
Potassium: 3.7 mmol/L (ref 3.5–5.1)
Sodium: 135 mmol/L (ref 135–145)
Total Bilirubin: 0.6 mg/dL (ref 0.3–1.2)
Total Protein: 8.9 g/dL — ABNORMAL HIGH (ref 6.5–8.1)

## 2022-10-22 LAB — CBC
HCT: 46.7 % — ABNORMAL HIGH (ref 36.0–46.0)
Hemoglobin: 14.9 g/dL (ref 12.0–15.0)
MCH: 28.8 pg (ref 26.0–34.0)
MCHC: 31.9 g/dL (ref 30.0–36.0)
MCV: 90.3 fL (ref 80.0–100.0)
Platelets: 223 10*3/uL (ref 150–400)
RBC: 5.17 MIL/uL — ABNORMAL HIGH (ref 3.87–5.11)
RDW: 14.4 % (ref 11.5–15.5)
WBC: 6.7 10*3/uL (ref 4.0–10.5)
nRBC: 0 % (ref 0.0–0.2)

## 2022-10-22 LAB — BRAIN NATRIURETIC PEPTIDE: B Natriuretic Peptide: 128.2 pg/mL — ABNORMAL HIGH (ref 0.0–100.0)

## 2022-10-22 MED ORDER — LACTATED RINGERS IV BOLUS
500.0000 mL | Freq: Once | INTRAVENOUS | Status: AC
  Administered 2022-10-22: 500 mL via INTRAVENOUS

## 2022-10-22 MED ORDER — HYDROCODONE-ACETAMINOPHEN 5-325 MG PO TABS: 2.0000 | ORAL_TABLET | ORAL | 0 refills | Status: DC | PRN

## 2022-10-22 MED ORDER — HYDROCODONE-ACETAMINOPHEN 5-325 MG PO TABS
1.0000 | ORAL_TABLET | Freq: Once | ORAL | Status: AC
  Administered 2022-10-22: 1 via ORAL
  Filled 2022-10-22: qty 1

## 2022-10-22 MED ORDER — METOCLOPRAMIDE HCL 10 MG PO TABS: 10.0000 mg | ORAL_TABLET | Freq: Four times a day (QID) | ORAL | 0 refills | Status: DC

## 2022-10-22 MED ORDER — LACTATED RINGERS IV BOLUS: 1000.0000 mL | Freq: Once | INTRAVENOUS | Status: DC

## 2022-10-22 NOTE — ED Triage Notes (Signed)
Pt c/o SOB and pain in left rib area and under left breast since 10/23. Pt has lost 100 lbs since 10/23. Pt states she can't eat well. Pt states she has decreased mobility since 10/23. Pt is tachypneic.

## 2022-10-22 NOTE — ED Provider Notes (Signed)
[place] EMERGENCY DEPARTMENT AT [facility] Provider Note   CSN: [id number] Arrival date & time: 10/22/22  1216     History  Chief Complaint  Patient presents with   Shortness of Breath    Julia Stark is a 62 y.o. female.  62 year old female with a history of atrial flutter status post ablation on Eliquis, pacemaker dependence Conservation officer, historic buildings), heart failure with preserved ejection fraction, diabetes, hypertension, hyperlipidemia, chronic dysphagia, and history of left-sided hemothorax and VATS procedure who presents to the emergency department with weight loss, dysphagia, and chest pain.  Since her procedure in October 2023 she reports that she has had persistent left-sided chest pain and shortness of breath.  Says that it is worsened recently.  Is pleuritic and predominantly on the left side of her chest.  Tried multiple medications including colchicine for this.  No significant leg swelling.  No fever or new cough.  Has been trying numbing patches and Tylenol at home for the pain recently.  Also has lost approximately 80 pounds since April of this year.  Has had extensive workup with GI that has included EGD, colonoscopy, and esophageal studies which showed that she has esophageal dysmotility as well as lymphocytic gastritis.  Says that she feels like her pills and food are getting stuck in her throat and that she becomes full very quickly.  Says that she is also having generalized fatigue and weight loss.  Says that she is tired of the pain and concerned about the weight loss and generalized weakness so she came into the emergency department today.        Home Medications Prior to Admission medications   Medication Sig Start Date End Date Taking? Authorizing Provider  HYDROcodone-acetaminophen (NORCO/VICODIN) 5-325 MG tablet Take 2 tablets by mouth every 4 (four) hours as needed. 10/22/22  Yes Rondel Baton, MD  metoCLOPramide (REGLAN) 10 MG tablet Take 1 tablet  (10 mg total) by mouth every 6 (six) hours. 10/22/22  Yes Rondel Baton, MD  apixaban (ELIQUIS) 5 MG TABS tablet Take 1 tablet (5 mg total) by mouth 2 (two) times daily. 07/29/22   Laurey Morale, MD  cholecalciferol (VITAMIN D3) 25 MCG (1000 UNIT) tablet Take 1,000 Units by mouth daily.    [provider]  dapagliflozin propanediol (FARXIGA) 10 MG TABS tablet Take 1 tablet (10 mg total) by mouth daily before breakfast. 10/19/22   Laurey Morale, MD  Iron, Ferrous Sulfate, 325 (65 Fe) MG TABS Take 325 mg by mouth daily. 03/16/22   [name], MD  Magnesium 400 MG CAPS Take 400 mg by mouth daily at 12 noon.    [provider]  omeprazole (PRILOSEC OTC) 20 MG tablet Take 20 mg by mouth in the morning and at bedtime.    [provider]  rosuvastatin (CRESTOR) 5 MG tablet Take 1 tablet (5 mg total) by mouth every other day. 09/04/22   [name], MD  spironolactone (ALDACTONE) 25 MG tablet Take 1 tablet (25 mg total) by mouth daily. 09/25/22   Laurey Morale, MD  torsemide (DEMADEX) 20 MG tablet Take 1 tablet (20 mg total) by mouth every other day. 09/01/22   Laurey Morale, MD  venlafaxine XR (EFFEXOR-XR) 150 MG 24 hr capsule TAKE 1 CAPSULE BY MOUTH ONCE DAILY WITH BREAKFAST 09/11/22   Bacigalupo, Marzella Schlein, MD  zolpidem (AMBIEN) 5 MG tablet Take 1 tablet (5 mg total) by mouth at bedtime as needed. for sleep  09/04/22   [name], MD      Allergies    Amiodarone, Flecainide, Metoprolol, and Trazodone    Review of Systems   Review of Systems  Physical Exam Updated Vital Signs BP 125/62   Pulse 78   Temp (!) 97.5 F (36.4 C) (Oral)   Resp 15   Ht 5\' 7"  (1.702 m)   Wt 62.9 kg   LMP 04/13/2011   SpO2 95%   BMI 21.72 kg/m  Physical Exam Vitals and nursing note reviewed.  Constitutional:      General: [name] not in acute distress.    Appearance: [name] well-developed.  HENT:     Head: Normocephalic and atraumatic.     Right  Ear: External ear normal.     Left Ear: External ear normal.     Nose: Nose normal.  Eyes:     Extraocular Movements: Extraocular movements intact.     Conjunctiva/sclera: Conjunctivae normal.     Pupils: Pupils are equal, round, and reactive to light.  Cardiovascular:     Rate and Rhythm: Normal rate and regular rhythm.     Heart sounds: No murmur heard.    Comments: Implanted pacemaker in upper left chest wall Pulmonary:     Effort: Pulmonary effort is normal. No respiratory distress.     Breath sounds: Normal breath sounds.     Comments: Chest pain not reproducible Abdominal:     General: Abdomen is flat. There is no distension.     Palpations: Abdomen is soft. There is no mass.     Tenderness: There is no abdominal tenderness. There is no guarding.  Musculoskeletal:     Cervical back: Normal range of motion and neck supple.     Right lower leg: No edema.     Left lower leg: No edema.  Skin:    General: Skin is warm and dry.  Neurological:     Mental Status: [name] alert and oriented to person, place, and time. Mental status is at baseline.  Psychiatric:        Mood and Affect: Mood normal.     ED Results / Procedures / Treatments   Labs (all labs ordered are listed, but only abnormal results are displayed) Labs Reviewed  COMPREHENSIVE METABOLIC PANEL - Abnormal; Notable for the following components:      Result Value   Chloride 96 (*)    Glucose, Bld 134 (*)    Total Protein 8.9 (*)    Alkaline Phosphatase 191 (*)    All other components within normal limits  CBC - Abnormal; Notable for the following components:   RBC 5.17 (*)    HCT 46.7 (*)    All other components within normal limits  BRAIN NATRIURETIC PEPTIDE - Abnormal; Notable for the following components:   B Natriuretic Peptide 128.2 (*)    All other components within normal limits  RESP PANEL BY RT-PCR (RSV, FLU A&B, COVID)  RVPGX2    EKG EKG Interpretation Date/Time:  Sunday October 22 2022 12:33:09  EDT Ventricular Rate:  80 PR Interval:    QRS Duration:  142 QT Interval:  436 QTC Calculation: 502 R Axis:   96  Text Interpretation: Ventricular-paced rhythm Abnormal ECG When compared with ECG of 28-Jul-2022 10:15, No significant change since last tracing Confirmed by Vonita Moss [phone]) on 10/22/2022 12:48:59 PM  Radiology DG Chest 2 View  Result Date: 10/22/2022 CLINICAL DATA:  Shortness of breath, history of pleural effusion. EXAM: CHEST -  2 VIEW COMPARISON:  05/12/2022. FINDINGS: Left chest dual-chamber pacemaker with leads projecting over the right atrium and ventricle. Left atrial appendage clip in place. Stable volume loss in the left lung with associated blunting of the left costophrenic sulcus. No consolidation or pulmonary edema. Stable cardiac and mediastinal contours. No pleural effusion or pneumothorax. Visualized bones and upper abdomen are unremarkable. IMPRESSION: 1. No evidence of acute cardiopulmonary disease. 2. Stable volume loss in the left lung with associated blunting of the left costophrenic sulcus. Electronically Signed   By: Orvan Falconer M.D.   On: 10/22/2022 14:20    Procedures Procedures    Medications Ordered in ED Medications  lactated ringers bolus 500 mL (0 mLs Intravenous Stopped 10/22/22 1353)  HYDROcodone-acetaminophen (NORCO/VICODIN) 5-325 MG per tablet 1 tablet (1 tablet Oral Given 10/22/22 1336)    ED Course/ Medical Decision Making/ A&P Clinical Course as of 10/22/22 1518  Sun Oct 22, 2022  1350 DNP Noel Gerold consulted.  Recommends p.o. challenge and if the patient passes we will set her up for outpatient endoscopy. [RP]    Clinical Course User Index [RP] Rondel Baton, MD                             Medical Decision Making Amount and/or Complexity of Data Reviewed Labs: ordered. Radiology: ordered.  Risk Prescription drug management.   Julia Stark is a 62 y.o. female with comorbidities that complicate the  patient evaluation including history of atrial flutter status post ablation on Eliquis, pacemaker dependence Conservation officer, historic buildings), heart failure with preserved ejection fraction, diabetes, hypertension, hyperlipidemia, chronic dysphagia, and history of left-sided hemothorax and VATS procedure who presents to the emergency department with weight loss, dysphagia, and chest pain.   Initial Ddx:  Pleural effusion, pneumonia, pneumothorax, PE, MI, malignancy, dysphagia, failure to thrive, weight loss, AKI, dehydration  MDM/Course:  Patient presents emergency department with multiple chronic complaints significantly weight loss, dysphagia, and pleuritic chest discomfort on her left side.  Is already on Eliquis.  Has had a pleural effusion that has been drained in the past.  On exam is satting well on room air and has no significant abdominal tenderness palpation.  She had a text x-ray which did not show recurrence of her pleural effusion.  COVID and flu were negative.  EKG did not show any new ischemic changes.  Since she is on anticoagulation and has been compliant with this feel that PE is highly unlikely especially given the duration of her pain.  Again with the duration and no new changes such as new radiation or diaphoresis or unexplained vomiting feel that MI is highly unlikely.  Upon re-evaluation remained stable and was able to tolerate p.o.  I did discuss her case with gastroenterology who recommended having her follow-up in clinic in 2 weeks.  They will call her about this.  They also recommended starting her on Reglan and to have her eat smaller meals.  She was given Norco in the emergency department which helped her symptoms and so we will give her a short prescription for this to help with her acute pain.  Also instructed her to follow-up with pain management regarding her symptoms as well.  This patient presents to the ED for concern of complaints listed in HPI, this involves an extensive number of  treatment options, and is a complaint that carries with it a high risk of complications and morbidity. Disposition including potential need for  admission considered.   Dispo: DC Home. Return precautions discussed including, but not limited to, those listed in the AVS. Allowed pt time to ask questions which were answered fully prior to dc.  Additional history obtained from spouse Records reviewed Outpatient Clinic Notes The following labs were independently interpreted: Chemistry and show no acute abnormality I independently reviewed the following imaging with scope of interpretation limited to determining acute life threatening conditions related to emergency care: Chest x-ray and agree with the radiologist interpretation with the following exceptions: none I personally reviewed and interpreted cardiac monitoring:  Paced rhythm I personally reviewed and interpreted the pt's EKG: see above for interpretation  I have reviewed the patients home medications and made adjustments as needed Consults: Gastroenterology       Final Clinical Impression(s) / ED Diagnoses Final diagnoses:  Dysphagia, unspecified type  Weight loss  Early satiety  Chest pain on breathing    Rx / DC Orders ED Discharge Orders          Ordered    metoCLOPramide (REGLAN) 10 MG tablet  Every 6 hours        10/22/22 1513    HYDROcodone-acetaminophen (NORCO/VICODIN) 5-325 MG tablet  Every 4 hours PRN        10/22/22 1513              Rondel Baton, MD 10/22/22 [phone]

## 2022-10-22 NOTE — Telephone Encounter (Signed)
Julia Stark, please contact the patient on Monday, 10/23/2022 as she was seen in the ED with dysphagia but did not require hospital admission she was able to swallow solid food and liquids at that time.  Please schedule her for an office appointment within the next 2 weeks with  Dr. Rhea Belton or with any APP.  If  here is not an open appointment within 2 weeks you can add her to my schedule at 330 but let me know if done.  Thank you

## 2022-10-22 NOTE — Discharge Instructions (Signed)
You were seen for your difficulty eating, weight loss, and chest pain in the emergency department.   At home, please start taking the Reglan we have prescribed you for any nausea or vomiting.  Please try to eat smaller meals 4-5 times per day.  Take Tylenol, ibuprofen, and over-the-counter lidocaine patches for your chest discomfort. You may also take the Norco we have prescribed you for any breakthrough pain that may have.  Do not take this before driving or operating heavy machinery.  Do not take this medication with alcohol.  Check your MyChart online for the results of any tests that had not resulted by the time you left the emergency department.   Follow-up with your primary doctor in 2-3 days regarding your visit.  GI will call you about a follow-up tomorrow.  Call the pain management clinic tomorrow to set up an appointment for your rib/chest discomfort.  Return immediately to the emergency department if you experience any of the following: Worsening shortness of breath, pain, vomiting, or any other concerning symptoms.    Thank you for visiting our Emergency Department. It was a pleasure taking care of you today.

## 2022-10-23 NOTE — Telephone Encounter (Signed)
Pt was made aware of Alcide Evener NP recommendations: Schedules reviewed and noted that there is an 7 day hold for an appointment on Tuesday 10/31/2022. Pt was notified that I would call her first thing tomorrow morning  and try and get the pt scheduled for that appointment and confirm it with her.  Pt verbalized understanding with all questions answered.

## 2022-10-24 DIAGNOSIS — Z79899 Other long term (current) drug therapy: Secondary | ICD-10-CM | POA: Diagnosis not present

## 2022-10-24 DIAGNOSIS — I484 Atypical atrial flutter: Secondary | ICD-10-CM | POA: Diagnosis not present

## 2022-10-24 DIAGNOSIS — I441 Atrioventricular block, second degree: Secondary | ICD-10-CM | POA: Diagnosis not present

## 2022-10-24 DIAGNOSIS — G8929 Other chronic pain: Secondary | ICD-10-CM | POA: Diagnosis not present

## 2022-10-24 DIAGNOSIS — I10 Essential (primary) hypertension: Secondary | ICD-10-CM | POA: Diagnosis not present

## 2022-10-24 DIAGNOSIS — R634 Abnormal weight loss: Secondary | ICD-10-CM | POA: Diagnosis not present

## 2022-10-24 DIAGNOSIS — I471 Supraventricular tachycardia, unspecified: Secondary | ICD-10-CM | POA: Diagnosis not present

## 2022-10-24 DIAGNOSIS — Z950 Presence of cardiac pacemaker: Secondary | ICD-10-CM | POA: Diagnosis not present

## 2022-10-24 DIAGNOSIS — I5032 Chronic diastolic (congestive) heart failure: Secondary | ICD-10-CM | POA: Diagnosis not present

## 2022-10-24 DIAGNOSIS — Z9889 Other specified postprocedural states: Secondary | ICD-10-CM | POA: Diagnosis not present

## 2022-10-24 DIAGNOSIS — I4819 Other persistent atrial fibrillation: Secondary | ICD-10-CM | POA: Diagnosis not present

## 2022-10-24 DIAGNOSIS — Z87440 Personal history of urinary (tract) infections: Secondary | ICD-10-CM | POA: Diagnosis not present

## 2022-10-24 DIAGNOSIS — Z7901 Long term (current) use of anticoagulants: Secondary | ICD-10-CM | POA: Diagnosis not present

## 2022-10-24 DIAGNOSIS — I480 Paroxysmal atrial fibrillation: Secondary | ICD-10-CM | POA: Diagnosis not present

## 2022-10-24 DIAGNOSIS — I4891 Unspecified atrial fibrillation: Secondary | ICD-10-CM | POA: Diagnosis not present

## 2022-10-24 DIAGNOSIS — I499 Cardiac arrhythmia, unspecified: Secondary | ICD-10-CM | POA: Diagnosis not present

## 2022-10-24 NOTE — Telephone Encounter (Signed)
Appointment was scheduled for an office visit on 10/31/2022 at 1:30 PM  with Alcide Evener NP.  Pt made aware. Pt verbalized understanding with all questions answered.

## 2022-10-25 ENCOUNTER — Ambulatory Visit: Payer: Medicaid Other | Admitting: Dermatology

## 2022-10-25 VITALS — BP 118/65 | HR 69

## 2022-10-25 DIAGNOSIS — L570 Actinic keratosis: Secondary | ICD-10-CM | POA: Diagnosis not present

## 2022-10-25 DIAGNOSIS — W908XXA Exposure to other nonionizing radiation, initial encounter: Secondary | ICD-10-CM | POA: Diagnosis not present

## 2022-10-25 NOTE — Progress Notes (Signed)
   Follow-Up Visit   Subjective  Julia Stark is a 62 y.o. female who presents for the following: 3 months f/u on a biopsy proven AK on the left chin, patient here for treatment.    The following portions of the chart were reviewed this encounter and updated as appropriate: medications, allergies, medical history  Review of Systems:  No other skin or systemic complaints except as noted in HPI or Assessment and Plan.  Objective  Well appearing patient in no apparent distress; mood and affect are within normal limits.   A focused examination was performed of the following areas:face,chin    Relevant exam findings are noted in the Assessment and Plan.  left chin Erythematous thin papules/macules with gritty scale.    Assessment & Plan   AK (actinic keratosis) left chin  Actinic keratoses are precancerous spots that appear secondary to cumulative UV radiation exposure/sun exposure over time. They are chronic with expected duration over 1 year. A portion of actinic keratoses will progress to squamous cell carcinoma of the skin. It is not possible to reliably predict which spots will progress to skin cancer and so treatment is recommended to prevent development of skin cancer.  Recommend daily broad spectrum sunscreen SPF 30+ to sun-exposed areas, reapply every 2 hours as needed.  Recommend staying in the shade or wearing long sleeves, sun glasses (UVA+UVB protection) and wide brim hats (4-inch brim around the entire circumference of the hat). Call for new or changing lesions.   Destruction of lesion - left chin Complexity: simple   Destruction method: cryotherapy   Informed consent: discussed and consent obtained   Timeout:  patient name, date of birth, surgical site, and procedure verified Lesion destroyed using liquid nitrogen: Yes   Region frozen until ice ball extended beyond lesion: Yes   Outcome: patient tolerated procedure well with no complications   Post-procedure  details: wound care instructions given     Return for scheduled appt April 2025.  IAngelique Holm, CMA, am acting as scribe for Armida Sans, MD .   Documentation: I have reviewed the above documentation for accuracy and completeness, and I agree with the above.  Armida Sans, MD

## 2022-10-25 NOTE — Patient Instructions (Addendum)
Cryotherapy Aftercare  Wash gently with soap and water everyday.   Apply Vaseline and Band-Aid daily until healed.     Due to recent changes in healthcare laws, you may see results of your pathology and/or laboratory studies on MyChart before the doctors have had a chance to review them. We understand that in some cases there may be results that are confusing or concerning to you. Please understand that not all results are received at the same time and often the doctors may need to interpret multiple results in order to provide you with the best plan of care or course of treatment. Therefore, we ask that you please give us 2 business days to thoroughly review all your results before contacting the office for clarification. Should we see a critical lab result, you will be contacted sooner.   If You Need Anything After Your Visit  If you have any questions or concerns for your doctor, please call our main line at [phone] and press option 4 to reach your doctor's medical assistant. If no one answers, please leave a voicemail as directed and we will return your call as soon as possible. Messages left after 4 pm will be answered the following business day.   You may also send us a message via MyChart. We typically respond to MyChart messages within 1-2 business days.  For prescription refills, please ask your pharmacy to contact our office. Our fax number is [phone].  If you have an urgent issue when the clinic is closed that cannot wait until the next business day, you can page your doctor at the number below.    Please note that while we do our best to be available for urgent issues outside of office hours, we are not available 24/7.   If you have an urgent issue and are unable to reach us, you may choose to seek medical care at your doctor's office, retail clinic, urgent care center, or emergency room.  If you have a medical emergency, please immediately call 911 or go to the  emergency department.  Pager Numbers  - Dr. Kowalski: [phone]  - Dr. Moye: [phone]  - Dr. Stewart: [phone]  In the event of inclement weather, please call our main line at [phone] for an update on the status of any delays or closures.  Dermatology Medication Tips: Please keep the boxes that topical medications come in in order to help keep track of the instructions about where and how to use these. Pharmacies typically print the medication instructions only on the boxes and not directly on the medication tubes.   If your medication is too expensive, please contact our office at [phone] option 4 or send us a message through MyChart.   We are unable to tell what your co-pay for medications will be in advance as this is different depending on your insurance coverage. However, we may be able to find a substitute medication at lower cost or fill out paperwork to get insurance to cover a needed medication.   If a prior authorization is required to get your medication covered by your insurance company, please allow us 1-2 business days to complete this process.  Drug prices often vary depending on where the prescription is filled and some pharmacies may offer cheaper prices.  The website www.goodrx.com contains coupons for medications through different pharmacies. The prices here do not account for what the cost may be with help from insurance (it may be cheaper with your insurance), but the website can   give you the price if you did not use any insurance.  - You can print the associated coupon and take it with your prescription to the pharmacy.  - You may also stop by our office during regular business hours and pick up a GoodRx coupon card.  - If you need your prescription sent electronically to a different pharmacy, notify our office through [place] MyChart or by phone at [phone] option 4.     Si Usted Necesita Algo Despus de Su Visita  Tambin puede  enviarnos un mensaje a travs de MyChart. Por lo general respondemos a los mensajes de MyChart en el transcurso de 1 a 2 das hbiles.  Para renovar recetas, por favor pida a su farmacia que se ponga en contacto con nuestra oficina. Nuestro nmero de fax es el [phone].  Si tiene un asunto urgente cuando la clnica est cerrada y que no puede esperar hasta el siguiente da hbil, puede llamar/localizar a su doctor(a) al nmero que aparece a continuacin.   Por favor, tenga en cuenta que aunque hacemos todo lo posible para estar disponibles para asuntos urgentes fuera del horario de oficina, no estamos disponibles las 24 horas del da, los 7 das de la semana.   Si tiene un problema urgente y no puede comunicarse con nosotros, puede optar por buscar atencin mdica  en el consultorio de su doctor(a), en una clnica privada, en un centro de atencin urgente o en una sala de emergencias.  Si tiene una emergencia mdica, por favor llame inmediatamente al 911 o vaya a la sala de emergencias.  Nmeros de bper  - Dr. Kowalski: [phone]  - Dra. Moye: [phone]  - Dra. Stewart: [phone]  En caso de inclemencias del tiempo, por favor llame a nuestra lnea principal al [phone] para una actualizacin sobre el estado de cualquier retraso o cierre.  Consejos para la medicacin en dermatologa: Por favor, guarde las cajas en las que vienen los medicamentos de uso tpico para ayudarle a seguir las instrucciones sobre dnde y cmo usarlos. Las farmacias generalmente imprimen las instrucciones del medicamento slo en las cajas y no directamente en los tubos del medicamento.   Si su medicamento es muy caro, por favor, pngase en contacto con nuestra oficina llamando al [phone] y presione la opcin 4 o envenos un mensaje a travs de MyChart.   No podemos decirle cul ser su copago por los medicamentos por adelantado ya que esto es diferente dependiendo de la cobertura de su seguro.  Sin embargo, es posible que podamos encontrar un medicamento sustituto a menor costo o llenar un formulario para que el seguro cubra el medicamento que se considera necesario.   Si se requiere una autorizacin previa para que su compaa de seguros cubra su medicamento, por favor permtanos de 1 a 2 das hbiles para completar este proceso.  Los precios de los medicamentos varan con frecuencia dependiendo del lugar de dnde se surte la receta y alguna farmacias pueden ofrecer precios ms baratos.  El sitio web www.goodrx.com tiene cupones para medicamentos de diferentes farmacias. Los precios aqu no tienen en cuenta lo que podra costar con la ayuda del seguro (puede ser ms barato con su seguro), pero el sitio web puede darle el precio si no utiliz ningn seguro.  - Puede imprimir el cupn correspondiente y llevarlo con su receta a la farmacia.  - Tambin puede pasar por nuestra oficina durante el horario de atencin regular y recoger una tarjeta de cupones de GoodRx.  -   Si necesita que su receta se enve electrnicamente a una farmacia diferente, informe a nuestra oficina a travs de MyChart de [place] o por telfono llamando al [phone] y presione la opcin 4.  

## 2022-10-27 ENCOUNTER — Encounter: Payer: Self-pay | Admitting: Family Medicine

## 2022-10-27 MED ORDER — ALPRAZOLAM 0.25 MG PO TABS: 0.2500 mg | ORAL_TABLET | Freq: Two times a day (BID) | ORAL | 0 refills | Status: DC | PRN

## 2022-10-29 ENCOUNTER — Encounter: Payer: Self-pay | Admitting: Dermatology

## 2022-10-31 ENCOUNTER — Ambulatory Visit: Payer: Medicaid Other | Admitting: Nurse Practitioner

## 2022-10-31 ENCOUNTER — Encounter: Payer: Self-pay | Admitting: Nurse Practitioner

## 2022-10-31 VITALS — BP 102/64 | HR 71 | Ht 67.0 in | Wt 133.0 lb

## 2022-10-31 DIAGNOSIS — R748 Abnormal levels of other serum enzymes: Secondary | ICD-10-CM

## 2022-10-31 DIAGNOSIS — K224 Dyskinesia of esophagus: Secondary | ICD-10-CM | POA: Diagnosis not present

## 2022-10-31 DIAGNOSIS — R1011 Right upper quadrant pain: Secondary | ICD-10-CM

## 2022-10-31 DIAGNOSIS — R634 Abnormal weight loss: Secondary | ICD-10-CM | POA: Diagnosis not present

## 2022-10-31 DIAGNOSIS — R7989 Other specified abnormal findings of blood chemistry: Secondary | ICD-10-CM

## 2022-10-31 DIAGNOSIS — R1310 Dysphagia, unspecified: Secondary | ICD-10-CM

## 2022-10-31 NOTE — Progress Notes (Signed)
Addendum: Reviewed and agree with assessment and management plan. Was the patient ever able to try Motegrity for hypercontractile esophagus?  And the limited number of patients this medication has been effective for esophageal hypomotility. Agree with CT scan If no CT scan findings to explain weight loss then would recommend repeat upper endoscopy in the outpatient hospital setting assuming cardiology is okay with anticoagulation hold.  We would very likely perform esophageal biopsies at the time of her next examination. Thank you for your help and input in this patient with complicated medical issues. Jacqulin Brandenburger, Carie Caddy, MD

## 2022-10-31 NOTE — Patient Instructions (Addendum)
General this is a very complicated _______________________________________________________  If your blood pressure at your visit was 140/90 or greater, please contact your primary care physician to follow up on this.  _______________________________________________________  If you are age 62 or older, your body mass index should be between 23-30. Your Body mass index is 20.83 kg/m. If this is out of the aforementioned range listed, please consider follow up with your Primary Care Provider.  If you are age 5 or younger, your body mass index should be between 19-25. Your Body mass index is 20.83 kg/m. If this is out of the aformentioned range listed, please consider follow up with your Primary Care Provider.   ________________________________________________________  The [facility] providers would like to encourage you to use [facility] to communicate with providers for non-urgent requests or questions.  Due to long hold times on the telephone, sending your provider a message by [facility] may be a faster and more efficient way to get a response.  Please allow 48 business hours for a response.  Please remember that this is for non-urgent requests.  _______________________________________________________   Bonita Quin have been scheduled for a CT scan of the abdomen and pelvis at [facility], 1st floor Radiology. You are scheduled on 11-08-2022 at 5pm. You should arrive 30 minutes prior to your appointment time for registration.   Please follow the written instructions below on the day of your exam:   1) Do not eat anything after 1pm (4 hours prior to your test)   You may take any medications as prescribed with a small amount of water, if necessary. If you take any of the following medications: METFORMIN, GLUCOPHAGE, GLUCOVANCE, AVANDAMET, RIOMET, FORTAMET, ACTOPLUS MET, JANUMET, GLUMETZA or METAGLIP, you MAY be asked to HOLD this medication 48 hours AFTER the exam.   The purpose of you drinking the  oral contrast is to aid in the visualization of your intestinal tract. The contrast solution may cause some diarrhea. Depending on your individual set of symptoms, you may also receive an intravenous injection of x-ray contrast/dye. Plan on being at [facility] for 45 minutes or longer, depending on the type of exam you are having performed.   If you have any questions regarding your exam or if you need to reschedule, you may call [facility] at [phone] between the hours of 8:00 am and 5:00 pm, Monday-Friday.      increase your Fairlife protein drinks to twice a day.  Soft diet as tolerated.  It was a pleasure to see you today!  Thank you for trusting me with your gastrointestinal care!

## 2022-10-31 NOTE — Progress Notes (Signed)
10/31/2022 Julia Stark [id number] 11/17/1960   Chief Complaint: Follow-up dysphagia  History of Present Illness: Julia Stark is a 62 year old female with a past medical history of anxiety, depression, anemia, diastolic CHF, SVT, atrial fibrillation on Eliquis s/p multiple ablations/cardioversions and s/p convergent procedure with ablation 12/14/2021 complicated by a left hemothorax s/p chest tube placement, recurrent pleural effusions s/p left VATS at [facility] on 01/13/2022 on home oxygen 2L [place] at night, pulmonary hypertension, s/p pacemaker placement 05/2022, DM type II, kidney stones, colon polyps and dysphagia. Past cholecystectomy.  I Initially saw patient in office 05/02/2022, refer to that office consult note for comprehensive review. At that time, she endorsed having oral phase dysphagia which started following her convergent procedure with ablation. She was on Omeprazole 20mg  po bid. She was prescribed a course of Fluconazole for suspected candidiasis esophagitis in setting of taking several courses of antibiotics for a UTI and for staph epidermis in her pleural fluid/hemothorax.  A barium swallow study 05/10/2022 showed mild to moderate esophageal dysmotility.  Dr. Rhea Belton assessed her esophageal dysmotility was likely neuropathic related to previous cardiac interventions/ablation and EGD unlikely to add any benefit at that juncture. Prior EGD 10/26/2020 showed a normal esophagus, lymphocytic gastritis and a normal duodenum.  Her outpatient colonoscopy was canceled with plans to reschedule a future colonoscopy in the hospital setting when her cardiac status and respiratory status stable. Her most recent colonoscopy was 04/09/2017 which identified internal hemorrhoids, no polyps. She was advised to repeat a colonoscopy in 5 years due to a prior history of adenomatous colon polyps per colonoscopy completed by Dr. Mechele Collin in [place] from the age of 75. Maternal uncle with history of  colon cancer.   She was seen in office by Dr. Rhea Belton for follow-up 08/01/2022.  At that time, her dysphagia symptoms were significantly better and endoscopic evaluation was deferred.  She was instructed to continue Omeprazole 20 mg twice daily.  She was no longer anemic at that time.  She had recurrence of dysphagia with persistent weight loss and constipation.  She was prescribed Motegrity 2 mg 1 tab daily for esophageal dysmotility as well as to treat her chronic constipation.  She took 3 doses of Motegrity which resulted in stomach pain so she stopped taking it.  She restarted Linzess as needed for constipation.  She developed RUQ pain.  Labs ordered by her PCP 09/04/2022 showed Alk phos level of 303.  Total bili 0.6.  AST 43.  ALT 33.  GGT 477.  Hepatitis A IgM negative.  She underwent an abdominal sonogram 09/21/2022 which showed a normal liver and s/p cystectomy physiology without CBD dilatation.  She presented to the ED 10/22/2022 with dysphagia, chest pain and weight loss.  She endorsed losing 80 pounds since April of this year.  Labs in the ED showed a WBC count of 6.7.  Hemoglobin 14.9.  Platelet 223.  Sodium 135.  Potassium 3.7.  Glucose 134.  BUN 19.  Creatinine 0.55.  Alk phos 191.  Total bili 0.6.  AST 38.  ALT 26.  BNP 128.2.  EKG without acute ischemia.  Chest x-ray showed stable volume loss in the left lung without acute cardiopulmonary disease.  Clinical status was stable and she demonstrated swallowing liquids and solid food without difficulty therefore she was discharged home with plans for close GI follow-up and for consideration for endoscopic evaluation.  She stated she is tired of feeling bad.  She has lost 52lbs since 07/2022  per Epic records.  She continues to have dysphagia.  She describes feeling as if her throat is closing up and is worse when she tilts her head back follow-up pills with water.  She describes having food which gets stuck in her throat which results in drinking a lot  of water to push down the stuck food occurs daily.  She remains on Omeprazole 20 mg twice daily.  [name] not taking Reglan.  She continues to have intermittent RUQ pain x 2 months as well as chronic LUQ pain.  [name] drinking a fair life protein shake with 30 g of protein daily.  [name] mostly eating soups and soft cooked eggs.  She eats 2-3 bites of food then feels full.  She has intermittent nausea without vomiting.  She continues to have constipation.  She passes a solid hard green stool every few days.  Stools are green since starting oral iron.  No rectal bleeding or black stools.  She takes Linzess as needed which results in passing a loose stool.  She remains on oxygen 2 L nasal cannula at night.  Stated her cardiologist recently refer her to pulmonology, consult not yet scheduled.      Latest Ref Rng & Units 10/22/2022   12:54 PM 07/28/2022    1:08 PM 07/28/2022    1:07 PM  CBC  WBC 4.0 - 10.5 K/uL 6.7     Hemoglobin 12.0 - 15.0 g/dL 16.1  09.6  04.5   Hematocrit 36.0 - 46.0 % 46.7  37.0  36.0   Platelets 150 - 400 K/uL 223           Latest Ref Rng & Units 10/22/2022   12:54 PM 09/18/2022   12:29 PM 09/15/2022    3:56 PM  CMP  Glucose 70 - 99 mg/dL 409  811  914   BUN 8 - 23 mg/dL 19  20  21    Creatinine 0.44 - 1.00 mg/dL 7.82  9.56  2.13   Sodium 135 - 145 mmol/L 135  135  139   Potassium 3.5 - 5.1 mmol/L 3.7  3.4  3.4   Chloride 98 - 111 mmol/L 96  97  96   CO2 22 - 32 mmol/L 29  29  30    Calcium 8.9 - 10.3 mg/dL 9.9  9.4  08.6   Total Protein 6.5 - 8.1 g/dL 8.9     Total Bilirubin 0.3 - 1.2 mg/dL 0.6     Alkaline Phos 38 - 126 U/L 191     AST 15 - 41 U/L 38     ALT 0 - 44 U/L 26       RUQ sonogram 09/21/2022:  FINDINGS: Gallbladder: Surgically absent   Common bile duct: Diameter: 4 mm   Liver: No focal lesion identified. Within normal limits in parenchymal echogenicity. Portal vein is patent on color Doppler imaging with normal direction of blood flow towards the  liver.    IMPRESSION: No acute process.  Barium swallow study 05/10/2022: FINDINGS: Swallowing: Appears normal. No vestibular penetration or aspiration seen.   Pharynx: Unremarkable.  No masses, lesions, or ulcers.   Esophagus: Normal appearance.  No masses, lesions, or ulcers.   Esophageal motility: Mild-moderate esophageal dysmotility characterized by delayed passage of barium bolus without evidence of tertiary contractions.   Hiatal Hernia: None.   Gastroesophageal reflux: None visualized.   Ingested 13mm barium tablet: Passed normally    IMPRESSION: Mild-moderate esophageal dysmotility characterized by delayed passage of barium  bolus.  CARDIAC STUDIES:  Cardiac catheterization 07/28/2022: 1. Normal filling pressures.  2. Mild to moderate pulmonary arterial hypertension (PVR not markedly high, only 3.5 WU).  3. Preserved cardiac output.  4. No interventricular interdependence with simultaneous RV/LV pressure tracings, no evidence for constrictive pericarditis.   Echo (07/2022): EF 50-55%, moderate RV dysfunction, moderate RV enlargement, PASP 46 mmHg, mild-moderate TR; no evidence for constrictive pericarditis   PAST GI PROCEDURES:   EGD 10/26/2020: Normal esophagus Gastritis  Normal duodenum  1. Surgical [P], gastric antrum - CHRONIC GASTRITIS WITH INTRAEPITHELIAL LYMPHOCYTES, SEE COMMENT. - WARTHIN-STARRY IS NEGATIVE FOR HELICOBACTER PYLORI. - NO INTESTINAL METAPLASIA, DYSPLASIA, OR MALIGNANCY. 2. Surgical [P], fundus and gastric body - CHRONIC GASTRITIS WITH INTRAEPITHELIAL LYMPHOCYTES, SEE COMMENT. - WARTHIN-STARRY IS NEGATIVE FOR HELICOBACTER PYLORI. - NO INTESTINAL METAPLASIA, DYSPLASIA, OR MALIGNANCY.   Colonoscopy 04/09/2017 by Dr. Lynnae Prude: Internal hemorrhoids No polyps Recall colonoscopy 5 years for adenoma surveillance  Current Outpatient Medications on File Prior to Visit  Medication Sig Dispense Refill   ALPRAZolam (XANAX) 0.25 MG tablet  Take 1 tablet (0.25 mg total) by mouth 2 (two) times daily as needed for anxiety. 20 tablet 0   apixaban (ELIQUIS) 5 MG TABS tablet Take 1 tablet (5 mg total) by mouth 2 (two) times daily. 60 tablet 6   cholecalciferol (VITAMIN D3) 25 MCG (1000 UNIT) tablet Take 1,000 Units by mouth daily.     dapagliflozin propanediol (FARXIGA) 10 MG TABS tablet Take 1 tablet (10 mg total) by mouth daily before breakfast. 30 tablet 6   HYDROcodone-acetaminophen (NORCO/VICODIN) 5-325 MG tablet Take 2 tablets by mouth every 4 (four) hours as needed. 20 tablet 0   Iron, Ferrous Sulfate, 325 (65 Fe) MG TABS Take 325 mg by mouth daily. 30 tablet 5   linaclotide (LINZESS) 72 MCG capsule Take 72 mcg by mouth daily as needed.     omeprazole (PRILOSEC OTC) 20 MG tablet Take 20 mg by mouth in the morning and at bedtime.     rosuvastatin (CRESTOR) 5 MG tablet Take 1 tablet (5 mg total) by mouth every other day. 45 tablet 1   spironolactone (ALDACTONE) 25 MG tablet Take 1 tablet (25 mg total) by mouth daily. 90 tablet 3   venlafaxine XR (EFFEXOR-XR) 150 MG 24 hr capsule TAKE 1 CAPSULE BY MOUTH ONCE DAILY WITH BREAKFAST 90 capsule 0   zolpidem (AMBIEN) 5 MG tablet Take 1 tablet (5 mg total) by mouth at bedtime as needed. for sleep 30 tablet 5   Magnesium 400 MG CAPS Take 400 mg by mouth daily at 12 noon. (Patient not taking: Reported on 10/31/2022)     metoCLOPramide (REGLAN) 10 MG tablet Take 1 tablet (10 mg total) by mouth every 6 (six) hours. 30 tablet 0   torsemide (DEMADEX) 20 MG tablet Take 1 tablet (20 mg total) by mouth every other day. (Patient not taking: Reported on 10/31/2022)     No current facility-administered medications on file prior to visit.   Allergies  Allergen Reactions   Amiodarone Other (See Comments)    Other Reaction(s): Other (See Comments), Vomiting  Blurred vision   Flecainide Nausea And Vomiting    Hallucination   Metoprolol Shortness Of Breath     Muscle Pain     Trazodone Other (See  Comments)    headaches   Current Medications, Allergies, Past Medical History, Past Surgical History, Family History and Social History were reviewed in [employer] record.  Review of Systems:  Constitutional: + Weight loss, no night sweats.  Respiratory: + SOB. Cardiovascular: + Chest pain, chronic.  Gastrointestinal: See HPI.  Musculoskeletal: Negative for back pain or muscle aches.  Neurological: Negative for dizziness, headaches or paresthesias.   Physical Exam:  BP 102/64   Pulse 71   Ht 5\' 7"  (1.702 m)   Wt 133 lb (60.3 kg)   LMP 04/13/2011   SpO2 96%   BMI 20.83 kg/m    Wt Readings from Last 3 Encounters:  10/31/22 133 lb (60.3 kg)  10/22/22 138 lb 10.7 oz (62.9 kg)  10/02/22 138 lb 11.2 oz (62.9 kg)    Weight 185lbs on 05/02/2022 Weight 155lbs on 08/01/2022  General: 62 year old female fatigued appearing in no acute distress. Head: Normocephalic and atraumatic. Eyes: No scleral icterus. Conjunctiva pink . Ears: Normal auditory acuity. Mouth: Dentition intact. No ulcers or lesions.  Lungs: Clear throughout to auscultation. Heart: Regular rate and rhythm, no murmur. Abdomen: Soft, nondistended. RUQ tenderness without rebound or guarding. No masses or hepatomegaly. Normal bowel sounds x 4 quadrants.  Rectal: Deferred. Musculoskeletal: Symmetrical with no gross deformities. Extremities: No edema. Neurological: Alert oriented x 4. No focal deficits.  Psychological: Alert and cooperative. Normal mood and affect  Assessment and Recommendations:  62 year old female with recurrent dysphagia. Barium swallow study 05/10/2022 showed mild to moderate esophageal dysmotility, likely neuropathic related to previous cardiac interventions/ablation. EGD 10/26/2020 showed a normal esophagus, lymphocytic gastritis and a normal duodenum. Poor po intake with progressive weight loss. -I will consult with Dr. Rhea Belton to discuss a modified swallowing study with  speech pathology versus EGD at [facility].  Patient is high risk. Cardiac clearance to include Eliquis hold instructions will be required if endoscopic evaluation pursued -Continue Omeprazole 20 mg p.o. twice daily -Soft foods as tolerated and drink two Fairlife protein shakes daily   RUQ pain. Elevated Alk phos level and GGT levels.  RUQ sonogram 09/21/2022 showed a normal liver without biliary ductal dilatation. -Hepatic panel, GGT -CTAP with oral and IV contrast   Weight loss, 52lbs since 07/2022 -CTAP as ordered above  History of colon polyps. Colonoscopy 04/2017, no polyps.  -Colon polyp surveillance colonoscopy deferred at this time as benefits do not outweigh the risks  Complex cardiopulmonary history including atrial fibrillation on Eliquis s/p multiple ablations/cardioversions and s/p convergent procedure with ablation 12/14/2021 complicated by a left hemothorax s/p chest tube placement, recurrent pleural effusions s/p left VATS at [facility] on 01/13/2022 on home oxygen 2L [place] at night. S/P AV nodal ablation and pacemaker placement 05/2022 secondary to recurrent atrial fibrillation.  Today's encounter was 40 minutes which included precharting, chart/result review, history/exam, face-to-face time used for counseling, formulating a treatment plan with follow-up and documentation.

## 2022-11-02 ENCOUNTER — Telehealth: Payer: Self-pay

## 2022-11-02 ENCOUNTER — Ambulatory Visit: Payer: Medicaid Other | Admitting: Cardiology

## 2022-11-02 ENCOUNTER — Telehealth: Payer: Self-pay | Admitting: Family Medicine

## 2022-11-02 ENCOUNTER — Encounter: Payer: Self-pay | Admitting: Cardiology

## 2022-11-02 ENCOUNTER — Encounter: Payer: Medicaid Other | Admitting: Cardiology

## 2022-11-02 VITALS — BP 129/55 | HR 70 | Resp 14 | Wt 133.0 lb

## 2022-11-02 DIAGNOSIS — I5032 Chronic diastolic (congestive) heart failure: Secondary | ICD-10-CM

## 2022-11-02 DIAGNOSIS — R0602 Shortness of breath: Secondary | ICD-10-CM

## 2022-11-02 NOTE — Progress Notes (Signed)
PCP: [name], MD EP: Dr Maisie Fus ([facility]) HF Cardiology: Dr. [name]  62 y.o. with history of permanent atrial fibrillation, diastolic CHF, and diabetes was referred for CHF MD evaluation by [name]. Patient has a long history of atrial fibrillation.  She has failed multiple anti-arrhythmic agents (flecainide, amiodarone, dronedarone, and propafenone).  She had an initial ablation in 5/21, then a redo ablation in 8/22.  At the time of 8/22 ablation, foci of atrial tachycardia were noted that were not ablated due to proximity to the AV node.  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  She had ongoing atrial arrhythmia episodes and underwent convergent endocardial/epicardial ablation at [facility] in 9/23 with left atrial appendage clipping.  This was complicated by ileus and then left hemothorax.  She developed a recurrent left pleural effusion and ended up with left VATS in 10/23.  Atrial fibrillation recurred, and ultimately, she had AV nodal ablation with [facility] DDD pacemaker with left bundle lead placed.    Since her surgery, she has had chronic pain radiating from left lower chest to upper back. Primarily located at the site where she had a chest tube at time of hemothorax.  Pain is pleuritic.  She was started on colchicine which initially helped but eventually stopped helping and she has stopped the medication.    She has been chronically short of breath since her surgery in 9/23.  Hemodynamic RHC/LHC in 4/24 showed no evidence for ventricular interdependence.  This was not suggestive of constrictive pericarditis.  Echo in 4/24 showed EF 50-55%, moderate RV dysfunction, moderate RV enlargement, PASP 46 mmHg, mild-moderate TR; no evidence for constrictive pericarditis.   She has had dysphagia and has been diagnosed by GI with esophageal dysmotility. She tried a medication for esophageal dysmotility but it caused GI upset so she stopped it.   She returns for  followup of CHF.  Still having chronic left lower lateral chest pain.  Plans to discuss pain clinic referral with PCP.  Weight has been trending down, she weighs 15 lbs less compared to last appointment.  Poor appetite and has ongoing dysphagia.  [name] waiting for CPAP, uses 2L oxygen at night. She finds it hard to take a deep breath, left lateral chest pain has a pleuritic component. Still sleeping on 3 pillow.  Dyspneic with stairs, inclines.  Fatigues easily. No change.  [name] only taking torsemide once or twice a week.   Labs (3/24): K 4.1, creatinine 0.55, BNP 299 Labs (4/24): K 3.5, creatinine 0.65, BNP 339 Labs (7/24): K 3.7, creatinine 0.55, LFTs normal except for alkaline phosphatase 191, BNP 128, hgb 14.9  PMH: 1. Atrial fibrillation/atrial flutter:  Now permanent. Tried and failed flecainide, amiodarone, dronedarone, and propafenone.  - AF ablation 5/21.   - AF ablation 8/22.  - Convergent epicardial/endocardial ablation with LA appendage clipping in 9/23.  Complicated by left hemothorax and then recurrent left pleural effusion, had left VATS in 10/23.  - AV nodal ablation with [employer] DDD pacemaker with left bundle lead placed 2/24.  2. Atrial tachycardia: Noted at time of AF ablation in 8/22.  Not ablated, near AV node.  3. 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  4. Chronic diastolic CHF: Cardiac MRI in 2/21 showed EF 61%, normal RV, no LGE.  - Cardiolite (3/22): No ischemia/infarction.  - Echo (10/23) with EF 50%, normal RV, moderate TR.  - Echo (4/24): EF 50-55%, moderate RV dysfunction, moderate RV enlargement,  PASP 46 mmHg, mild-moderate TR; no evidence for constrictive pericarditis.  - Hemodynamic LHC/RHC (4/24): mean RA 6, PA 54/27 mean 31, mean PCWP 9, CI 3.51, PVR 3.5 WU; no ventricular interdependence noted.  5. HTN 6. Type 2 diabetes 7. Depression 8. Hyperlipidemia 9. OSA: Mild-moderate 10. Left hemothorax: Complication of convergent procedure.   Developed recurrent pleural effusions, had left VATS in 10/23.   11. Esophageal dysmotility  Social History   Socioeconomic History   Marital status: Divorced    Spouse name: Not on file   Number of children: 3   Years of education: 12   Highest education level: Some college, no degree  Occupational History    Employer: [employer]    Comment: in special chemistry  Tobacco Use   Smoking status: Never   Smokeless tobacco: Never  Vaping Use   Vaping status: Never Used  Substance and Sexual Activity   Alcohol use: Not Currently    Alcohol/week: 4.0 standard drinks of alcohol    Types: 2 Glasses of wine, 2 Cans of beer per week   Drug use: No   Sexual activity: Yes    Partners: Male    Birth control/protection: Post-menopausal, Surgical  Other Topics Concern   Not on file  Social History Narrative   Not on file   Social Determinants of Health   Financial Resource Strain: Low Risk  (10/02/2022)   Overall Financial Resource Strain (CARDIA)    Difficulty of Paying Living Expenses: Not very hard  Food Insecurity: Food Insecurity Present (10/02/2022)   Hunger Vital Sign    Worried About Running Out of Food in the Last Year: Sometimes true    Ran Out of Food in the Last Year: Never true  Transportation Needs: No Transportation Needs (10/02/2022)   PRAPARE - Administrator, Civil Service (Medical): No    Lack of Transportation (Non-Medical): No  Physical Activity: Unknown (10/02/2022)   Exercise Vital Sign    Days of Exercise per Week: 0 days    Minutes of Exercise per Session: Not on file  Stress: Stress Concern Present (10/02/2022)   [employer] of Occupational Health - Occupational Stress Questionnaire    Feeling of Stress : To some extent  Social Connections: Socially Isolated (10/02/2022)   Social Connection and Isolation Panel [NHANES]    Frequency of Communication with Friends and Family: More than three times a week    Frequency of Social Gatherings with Friends  and Family: Once a week    Attends Religious Services: Never    Database administrator or Organizations: No    Attends Engineer, structural: Not on file    Marital Status: Divorced  Catering manager Violence: Not on file   Family History  Problem Relation Age of Onset   Diabetes Mother    Hypertension Mother    Skin cancer Mother    Depression Mother    Alzheimer's disease Father    Diabetes Sister    Depression Sister    Diabetes Brother    Colon cancer Maternal Uncle    Hypertension Maternal Grandfather    Hyperlipidemia Maternal Grandfather    Diabetes Paternal Grandmother    Heart disease Paternal Grandmother    Prostate cancer Paternal Grandfather    Ovarian cancer Cousin    Breast cancer Neg Hx    Cervical cancer Neg Hx    ROS: All systems reviewed and negative except as per HPI.   Current Outpatient Medications  Medication Sig  Dispense Refill   ALPRAZolam (XANAX) 0.25 MG tablet Take 1 tablet (0.25 mg total) by mouth 2 (two) times daily as needed for anxiety. 20 tablet 0   apixaban (ELIQUIS) 5 MG TABS tablet Take 1 tablet (5 mg total) by mouth 2 (two) times daily. 60 tablet 6   cholecalciferol (VITAMIN D3) 25 MCG (1000 UNIT) tablet Take 1,000 Units by mouth daily.     dapagliflozin propanediol (FARXIGA) 10 MG TABS tablet Take 1 tablet (10 mg total) by mouth daily before breakfast. 30 tablet 6   Iron, Ferrous Sulfate, 325 (65 Fe) MG TABS Take 325 mg by mouth daily. 30 tablet 5   linaclotide (LINZESS) 72 MCG capsule Take 72 mcg by mouth daily as needed.     metoCLOPramide (REGLAN) 10 MG tablet Take 1 tablet (10 mg total) by mouth every 6 (six) hours. 30 tablet 0   omeprazole (PRILOSEC OTC) 20 MG tablet Take 20 mg by mouth in the morning and at bedtime.     rosuvastatin (CRESTOR) 5 MG tablet Take 1 tablet (5 mg total) by mouth every other day. 45 tablet 1   spironolactone (ALDACTONE) 25 MG tablet Take 1 tablet (25 mg total) by mouth daily. 90 tablet 3   torsemide  (DEMADEX) 20 MG tablet Take 1 tablet (20 mg total) by mouth every other day.     venlafaxine XR (EFFEXOR-XR) 150 MG 24 hr capsule TAKE 1 CAPSULE BY MOUTH ONCE DAILY WITH BREAKFAST 90 capsule 0   zolpidem (AMBIEN) 5 MG tablet Take 1 tablet (5 mg total) by mouth at bedtime as needed. for sleep 30 tablet 5   HYDROcodone-acetaminophen (NORCO/VICODIN) 5-325 MG tablet Take 2 tablets by mouth every 4 (four) hours as needed. (Patient not taking: Reported on 11/02/2022) 20 tablet 0   No current facility-administered medications for this visit.   BP (!) 129/55 (BP Location: Right Arm, Patient Position: Sitting, Cuff Size: Normal)   Pulse 70   Resp 14   Wt 133 lb (60.3 kg)   LMP 04/13/2011   SpO2 100%   BMI 20.83 kg/m  General: NAD, thin Neck: No JVD, no thyromegaly or thyroid nodule.  Lungs: Clear to auscultation bilaterally with normal respiratory effort. CV: Nondisplaced PMI.  Heart regular S1/S2, no S3/S4, 1/6 SEM RUSB.  No peripheral edema.  No carotid bruit.  Normal pedal pulses.  Abdomen: Soft, nontender, no hepatosplenomegaly, no distention.  Skin: Intact without lesions or rashes.  Neurologic: Alert and oriented x 3.  Psych: Normal affect. Extremities: No clubbing or cyanosis.  HEENT: Normal.   Assessment/Plan: 1. Chronic diastolic CHF: Echo (10/23) with EF 50%, normal RV, moderate TR.  She has had pericardial-type symptoms with pleuritic chest pain on and off since 9/23 convergent procedure.  She had a left-sided VATS because of persistent pleural effusion. [name] currently on colchicine, says it has helped her chest pain but has not resolved it.  I have been concerned that she could have constrictive pericarditis post-cardiac surgery. However, echo in 4/24 showed EF 50-55%, moderate RV dysfunction, moderate RV enlargement, PASP 46 mmHg, mild-moderate TR; no evidence for constrictive pericarditis.  Hemodynamic LHC/RHC showed normal filling pressures with mild-moderate pulmonary hypertension  and no evidence for ventricular interdependence.  So far, imaging has not been consistent with constrictive pericarditis.  Situation looks more like RV failure.  She remains symptomatic, NYHA class III.  I think a significant part of current dyspnea may be lung related (recurrent left pleural effusions, s/p VATS).  Last CXR showed  volume loss left lung.  Not volume overloaded on exam.  - I will go ahead with a cardiac MRI to assess RV and also assess a different way for constrictive pericarditis which is still a concern with her clinical picture.  - She can continue torsemide use as needed.   - Continue spironolactone 25 mg daily. Recent BMET stable.  - Continue Farxiga 10 mg daily.  2. OSA: Mild-moderate.  Uses oxygen at night.    - Waiting for CPAP.   3. Atrial arrhythmias: She has history of atrial fibrillation, atrial flutter, atrial tachycardia, and AVNRT. Now with permanent atrial fibrillation despite 2 endocardial ablations and then a convergent endocardial/epicardial ablation.  She has failed multiple anti-arrhythmics as noted above. Now s/p AV nodal ablation with [facility] pacemaker with left bundle lead.  - Continue apixaban.  4. Pulmonary hypertension: Mild-moderate with PVR 3.5 WU on 4/24 RHC.  She has OSA which may contribute.  Has significant RV failure.  - To start CPAP.   - Do not think pulmonary vasodilators would be helpful. 5. Dysphagia: This appears to be due to esophageal dysmotility.  She sees GI, planned for CT abdomen/pelvis. May need repeat EGD.  - If she needs EGD, ok to hold Eliquis 2 days prior and day of.  6. H/o recurrent left pleural effusion: Initially with left hemothorax after convergent procedure, then developed recurrent pleural effusions and had VATS. I worry that some of her dyspnea is a lung issue, low left lung volume on CXR.  - I will try to add CT chest to her CT abdomen/pelvis ordered next week.  - She has a pulmonary appointment.  - Pulmonary rehab  may be helpful.  7. Left lateral chest pain: I think that this is neuropathic post-chest tube placement.  Pain originates in intercostal space where she had a chest tube.  Consider pain clinic referral, gabapentin or Lyrica may help.   Followup 2 months.   Marca Ancona 11/02/2022

## 2022-11-02 NOTE — Telephone Encounter (Signed)
Cardiac clearance sent to Dr. [name] in alternate phone note 11/02/22.

## 2022-11-02 NOTE — Patient Instructions (Addendum)
Testing/Procedures: You will have a CT of the chest along with your already scheduled CT of the Abdomen.    Special Instructions // Education:  Do the following things EVERYDAY: Weigh yourself in the morning before breakfast. Write it down and keep it in a log. Take your medicines as prescribed Eat low salt foods--Limit salt (sodium) to 2000 mg per day.  Stay as active as you can everyday Limit all fluids for the day to less than 2 liters   Follow-Up in: 2 months.     If you have any questions or concerns before your next appointment please send [place] a message through [place] or call our office at [phone] Monday-Friday 8 am-5 pm.   If you have an urgent need after hours on the weekend please call your Primary Cardiologist or the Advanced [facility] Failure Clinic in [place] at [phone].

## 2022-11-02 NOTE — Telephone Encounter (Signed)
  Julia Stark, I spoke to patient, she will proceed with CTAP as scheduled next week. If CT findings do not explain her sx and weight loss, we will plan on scheduling an EGD at [facility] with Dr. Rhea Belton if ok with her cardiology team.    I would like to be proactive and go ahead with requesting cardiac clearance to also include Eliquis hold instructions at this time. Please send a request for cardiac clearance to also include Eliquis hold instructions to her local cardiologist Dr. [name] for possible future EGD. She has a complex cardiac history and we may need to contact her [facility] cardiologist, Dr. Ritta Slot as well.

## 2022-11-02 NOTE — Telephone Encounter (Signed)
Dr. [name], thank you for providing Eliquis hold clearance.  Please further verify if you are providing cardiac clearance as well for possible EGD.  Thank you, Jill Side

## 2022-11-02 NOTE — Telephone Encounter (Signed)
Julia Stark [birth year] MRN: [id number]  Dear Dr. [name],   The patient listed above will potentially need to be scheduled for an EGD. Our records show that she is on anticoagulation therapy.   Please advise as to whether the patient may come off their therapy of Eliquis for 2 days prior to the procedure, as well as cardiac clearance to proceed.    Please route your response to Glendora Score, RN or fax response to [phone].  Sincerely,  [facility]

## 2022-11-02 NOTE — Telephone Encounter (Signed)
Covermymeds is requesting prior authorization Key: [id number] Name: [place] Zolpidem Tartrate 5mg  tablets

## 2022-11-02 NOTE — Progress Notes (Signed)
Julia Stark, I spoke to patient, she will proceed with CTAP as scheduled next week. If CT findings do not explain her sx and weight loss, we will plan on scheduling an EGD at [facility] with Dr. Rhea Belton if ok with her cardiology team.   I would like to be proactive and go ahead with requesting cardiac clearance to also include Julia Stark hold instructions at this time. Please send a request for cardiac clearance to also include Julia Stark hold instructions to her local cardiologist Dr. [name] for possible future EGD. She has a complex cardiac history and we may need to contact her [facility] cardiologist, Dr. Ritta Slot as well.

## 2022-11-03 ENCOUNTER — Other Ambulatory Visit: Payer: Self-pay

## 2022-11-03 ENCOUNTER — Encounter: Payer: Self-pay | Admitting: Intensive Care

## 2022-11-03 ENCOUNTER — Emergency Department
Admission: EM | Admit: 2022-11-03 | Discharge: 2022-11-03 | Disposition: A | Discharge status: Home / Self Care | Payer: Medicaid Other | Attending: Emergency Medicine | Admitting: Emergency Medicine

## 2022-11-03 ENCOUNTER — Emergency Department: Payer: Medicaid Other

## 2022-11-03 DIAGNOSIS — R5381 Other malaise: Secondary | ICD-10-CM | POA: Diagnosis not present

## 2022-11-03 DIAGNOSIS — R112 Nausea with vomiting, unspecified: Secondary | ICD-10-CM | POA: Insufficient documentation

## 2022-11-03 DIAGNOSIS — I4891 Unspecified atrial fibrillation: Secondary | ICD-10-CM | POA: Diagnosis not present

## 2022-11-03 DIAGNOSIS — G4489 Other headache syndrome: Secondary | ICD-10-CM | POA: Diagnosis not present

## 2022-11-03 DIAGNOSIS — R0602 Shortness of breath: Secondary | ICD-10-CM | POA: Diagnosis not present

## 2022-11-03 DIAGNOSIS — I4892 Unspecified atrial flutter: Secondary | ICD-10-CM | POA: Diagnosis not present

## 2022-11-03 DIAGNOSIS — I517 Cardiomegaly: Secondary | ICD-10-CM | POA: Diagnosis not present

## 2022-11-03 DIAGNOSIS — E860 Dehydration: Secondary | ICD-10-CM | POA: Insufficient documentation

## 2022-11-03 DIAGNOSIS — Z1152 Encounter for screening for COVID-19: Secondary | ICD-10-CM | POA: Insufficient documentation

## 2022-11-03 DIAGNOSIS — R1111 Vomiting without nausea: Secondary | ICD-10-CM | POA: Diagnosis not present

## 2022-11-03 DIAGNOSIS — Z950 Presence of cardiac pacemaker: Secondary | ICD-10-CM | POA: Diagnosis not present

## 2022-11-03 DIAGNOSIS — R0902 Hypoxemia: Secondary | ICD-10-CM | POA: Diagnosis not present

## 2022-11-03 DIAGNOSIS — R634 Abnormal weight loss: Secondary | ICD-10-CM | POA: Diagnosis not present

## 2022-11-03 DIAGNOSIS — R531 Weakness: Secondary | ICD-10-CM | POA: Diagnosis not present

## 2022-11-03 LAB — CBC
HCT: 45.2 % (ref 36.0–46.0)
Hemoglobin: 14.2 g/dL (ref 12.0–15.0)
MCH: 28.7 pg (ref 26.0–34.0)
MCHC: 31.4 g/dL (ref 30.0–36.0)
MCV: 91.5 fL (ref 80.0–100.0)
Platelets: 195 10*3/uL (ref 150–400)
RBC: 4.94 MIL/uL (ref 3.87–5.11)
RDW: 14.3 % (ref 11.5–15.5)
WBC: 4.7 10*3/uL (ref 4.0–10.5)
nRBC: 0 % (ref 0.0–0.2)

## 2022-11-03 LAB — BASIC METABOLIC PANEL
Anion gap: 13 (ref 5–15)
BUN: 21 mg/dL (ref 8–23)
CO2: 32 mmol/L (ref 22–32)
Calcium: 9.7 mg/dL (ref 8.9–10.3)
Chloride: 89 mmol/L — ABNORMAL LOW (ref 98–111)
Creatinine, Ser: 0.44 mg/dL (ref 0.44–1.00)
GFR, Estimated: >60 mL/min (ref >60)
Glucose, Bld: 122 mg/dL — ABNORMAL HIGH (ref 70–99)
Potassium: 3.9 mmol/L (ref 3.5–5.1)
Sodium: 134 mmol/L — ABNORMAL LOW (ref 135–145)

## 2022-11-03 LAB — SARS CORONAVIRUS 2 BY RT PCR: SARS Coronavirus 2 by RT PCR: NEGATIVE

## 2022-11-03 MED ORDER — PROCHLORPERAZINE EDISYLATE 10 MG/2ML IJ SOLN
10.0000 mg | Freq: Once | INTRAMUSCULAR | Status: AC
  Administered 2022-11-03: 10 mg via INTRAVENOUS
  Filled 2022-11-03: qty 2

## 2022-11-03 MED ORDER — SODIUM CHLORIDE 0.9 % IV BOLUS
500.0000 mL | Freq: Once | INTRAVENOUS | Status: AC
  Administered 2022-11-03: 500 mL via INTRAVENOUS

## 2022-11-03 NOTE — ED Provider Notes (Signed)
[facility] Provider Note    Event Date/Time   First MD Initiated Contact with Patient 11/03/22 1536     (approximate)   History   Weakness and Emesis   HPI  Julia Stark is a 62 y.o. female who presents to the emergency department today because of concerns for nausea vomiting and weakness.  Patient says that she is had issues with her appetite for some time now after complications secondary to surgery.  However over the past couple days she has felt increased weakness.  Today she had a headache as well as nausea and vomiting.  [name] concerned that she is dehydrated.  She denies any fevers.  No chills.     Physical Exam   Triage Vital Signs: ED Triage Vitals  Encounter Vitals Group     BP 11/03/22 1502 (!) 124/55     Systolic BP Percentile --      Diastolic BP Percentile --      Pulse Rate 11/03/22 1502 70     Resp 11/03/22 1502 16     Temp 11/03/22 1502 98 F (36.7 C)     Temp Source 11/03/22 1502 Oral     SpO2 11/03/22 1502 99 %     Weight 11/03/22 1505 131 lb (59.4 kg)     Height 11/03/22 1505 5\' 8"  (1.727 m)     Head Circumference --      Peak Flow --      Pain Score 11/03/22 1503 3     Pain Loc --      Pain Education --      Exclude from Growth Chart --     Most recent vital signs: Vitals:   11/03/22 1502  BP: (!) 124/55  Pulse: 70  Resp: 16  Temp: 98 F (36.7 C)  SpO2: 99%   General: Awake, alert, oriented. CV:  Good peripheral perfusion. Regular rate and rhythm. Resp:  Normal effort. Lungs clear. Abd:  No distention.    ED Results / Procedures / Treatments   Labs (all labs ordered are listed, but only abnormal results are displayed) Labs Reviewed  BASIC METABOLIC PANEL - Abnormal; Notable for the following components:      Result Value   Sodium 134 (*)    Chloride 89 (*)    Glucose, Bld 122 (*)    All other components within normal limits  CBC  URINALYSIS, ROUTINE W REFLEX MICROSCOPIC  CBG MONITORING, ED      EKG  I, Phineas Semen, attending physician, personally viewed and interpreted this EKG  EKG Time: 1515 Rate: 70 Rhythm: v paced rhythm Axis: rightward axis Intervals: qtc 498 QRS: wide ST changes: no st elevation Impression: abnormal ekg   RADIOLOGY I independently interpreted and visualized the CXR. My interpretation: No pneumonia Radiology interpretation:  IMPRESSION:  1. No active disease.  2. Stable cardiomegaly.     PROCEDURES:  Critical Care performed: No    MEDICATIONS ORDERED IN ED: Medications - No data to display   IMPRESSION / MDM / ASSESSMENT AND PLAN / ED COURSE  I reviewed the triage vital signs and the nursing notes.                              Differential diagnosis includes, but is not limited to, dehydration, viral illness, obstruction  Patient's presentation is most consistent with acute presentation with potential threat to life or bodily function.  The patient is on the cardiac monitor to evaluate for evidence of arrhythmia and/or significant heart rate changes.  Patient presented to the emergency department today because of concerns for nausea vomiting and weakness.  Patient was also found to be hypoxic on room air.  She did state however that she went to her pulmonologist earlier today and they told her that she will need to start wearing oxygen.  On exam here patient has generalized weakness.  Lungs are clear.  Blood work without concerning creatinine elevation, leukocytosis.  Will give IV fluids.  Patient was given IV fluids as well as medication.  She did feel little bit better and then desired discharge.  Blood work without any concerning acute abnormalities.  Chest x-ray without pneumonia.  At this time I think discharge is reasonable.      FINAL CLINICAL IMPRESSION(S) / ED DIAGNOSES   Final diagnoses:  Dehydration  Weakness      Note:  This document was prepared using Dragon voice recognition software and may  include unintentional dictation errors.    Phineas Semen, MD 11/03/22 [phone]

## 2022-11-03 NOTE — ED Triage Notes (Signed)
Arrived by [facility] from home with c/o weakness and emesis.   Pacemaker placed in October 2023 during ablation treatment for a-fib.   EMS vitals: 88% RA. Placed on 2L O2 98%  102CBG 72HR

## 2022-11-03 NOTE — Discharge Instructions (Addendum)
Please seek medical attention for any high fevers, chest pain, shortness of breath, change in behavior, persistent vomiting, bloody stool or any other new or concerning symptoms.  

## 2022-11-04 DIAGNOSIS — R0902 Hypoxemia: Secondary | ICD-10-CM | POA: Diagnosis not present

## 2022-11-04 DIAGNOSIS — R0602 Shortness of breath: Secondary | ICD-10-CM | POA: Diagnosis not present

## 2022-11-04 DIAGNOSIS — Z1152 Encounter for screening for COVID-19: Secondary | ICD-10-CM | POA: Diagnosis not present

## 2022-11-04 DIAGNOSIS — R112 Nausea with vomiting, unspecified: Secondary | ICD-10-CM | POA: Diagnosis not present

## 2022-11-04 DIAGNOSIS — J690 Pneumonitis due to inhalation of food and vomit: Secondary | ICD-10-CM | POA: Diagnosis not present

## 2022-11-04 DIAGNOSIS — E119 Type 2 diabetes mellitus without complications: Secondary | ICD-10-CM | POA: Diagnosis not present

## 2022-11-04 DIAGNOSIS — R931 Abnormal findings on diagnostic imaging of heart and coronary circulation: Secondary | ICD-10-CM | POA: Diagnosis not present

## 2022-11-04 DIAGNOSIS — I5032 Chronic diastolic (congestive) heart failure: Secondary | ICD-10-CM | POA: Diagnosis not present

## 2022-11-04 DIAGNOSIS — G9782 Other postprocedural complications and disorders of nervous system: Secondary | ICD-10-CM | POA: Diagnosis not present

## 2022-11-04 DIAGNOSIS — J9621 Acute and chronic respiratory failure with hypoxia: Secondary | ICD-10-CM | POA: Diagnosis not present

## 2022-11-04 DIAGNOSIS — J90 Pleural effusion, not elsewhere classified: Secondary | ICD-10-CM | POA: Diagnosis not present

## 2022-11-04 DIAGNOSIS — I272 Pulmonary hypertension, unspecified: Secondary | ICD-10-CM | POA: Diagnosis not present

## 2022-11-04 DIAGNOSIS — I5082 Biventricular heart failure: Secondary | ICD-10-CM | POA: Diagnosis not present

## 2022-11-04 DIAGNOSIS — J9611 Chronic respiratory failure with hypoxia: Secondary | ICD-10-CM | POA: Diagnosis not present

## 2022-11-04 DIAGNOSIS — J9622 Acute and chronic respiratory failure with hypercapnia: Secondary | ICD-10-CM | POA: Diagnosis not present

## 2022-11-04 DIAGNOSIS — G4733 Obstructive sleep apnea (adult) (pediatric): Secondary | ICD-10-CM | POA: Diagnosis not present

## 2022-11-04 DIAGNOSIS — R627 Adult failure to thrive: Secondary | ICD-10-CM | POA: Diagnosis not present

## 2022-11-04 DIAGNOSIS — K921 Melena: Secondary | ICD-10-CM | POA: Diagnosis not present

## 2022-11-04 DIAGNOSIS — R42 Dizziness and giddiness: Secondary | ICD-10-CM | POA: Diagnosis not present

## 2022-11-04 DIAGNOSIS — K224 Dyskinesia of esophagus: Secondary | ICD-10-CM | POA: Diagnosis not present

## 2022-11-04 DIAGNOSIS — D509 Iron deficiency anemia, unspecified: Secondary | ICD-10-CM | POA: Diagnosis not present

## 2022-11-04 DIAGNOSIS — R634 Abnormal weight loss: Secondary | ICD-10-CM | POA: Diagnosis not present

## 2022-11-04 DIAGNOSIS — I110 Hypertensive heart disease with heart failure: Secondary | ICD-10-CM | POA: Diagnosis not present

## 2022-11-04 DIAGNOSIS — E43 Unspecified severe protein-calorie malnutrition: Secondary | ICD-10-CM | POA: Diagnosis not present

## 2022-11-04 DIAGNOSIS — N179 Acute kidney failure, unspecified: Secondary | ICD-10-CM | POA: Diagnosis not present

## 2022-11-04 DIAGNOSIS — R109 Unspecified abdominal pain: Secondary | ICD-10-CM | POA: Diagnosis not present

## 2022-11-04 DIAGNOSIS — E874 Mixed disorder of acid-base balance: Secondary | ICD-10-CM | POA: Diagnosis not present

## 2022-11-04 DIAGNOSIS — J9811 Atelectasis: Secondary | ICD-10-CM | POA: Diagnosis not present

## 2022-11-04 DIAGNOSIS — R1314 Dysphagia, pharyngoesophageal phase: Secondary | ICD-10-CM | POA: Diagnosis not present

## 2022-11-04 DIAGNOSIS — I5A Non-ischemic myocardial injury (non-traumatic): Secondary | ICD-10-CM | POA: Diagnosis not present

## 2022-11-04 DIAGNOSIS — I4819 Other persistent atrial fibrillation: Secondary | ICD-10-CM | POA: Diagnosis not present

## 2022-11-04 DIAGNOSIS — R519 Headache, unspecified: Secondary | ICD-10-CM | POA: Diagnosis not present

## 2022-11-04 DIAGNOSIS — Z6820 Body mass index (BMI) 20.0-20.9, adult: Secondary | ICD-10-CM | POA: Diagnosis not present

## 2022-11-04 DIAGNOSIS — F331 Major depressive disorder, recurrent, moderate: Secondary | ICD-10-CM | POA: Diagnosis not present

## 2022-11-04 DIAGNOSIS — E042 Nontoxic multinodular goiter: Secondary | ICD-10-CM | POA: Diagnosis not present

## 2022-11-04 DIAGNOSIS — R64 Cachexia: Secondary | ICD-10-CM | POA: Diagnosis not present

## 2022-11-05 DIAGNOSIS — I5032 Chronic diastolic (congestive) heart failure: Secondary | ICD-10-CM | POA: Diagnosis not present

## 2022-11-06 DIAGNOSIS — I517 Cardiomegaly: Secondary | ICD-10-CM | POA: Diagnosis not present

## 2022-11-06 DIAGNOSIS — K224 Dyskinesia of esophagus: Secondary | ICD-10-CM | POA: Diagnosis not present

## 2022-11-06 DIAGNOSIS — K2950 Unspecified chronic gastritis without bleeding: Secondary | ICD-10-CM | POA: Diagnosis not present

## 2022-11-06 DIAGNOSIS — R1319 Other dysphagia: Secondary | ICD-10-CM | POA: Diagnosis not present

## 2022-11-06 DIAGNOSIS — R918 Other nonspecific abnormal finding of lung field: Secondary | ICD-10-CM | POA: Diagnosis not present

## 2022-11-06 DIAGNOSIS — K3189 Other diseases of stomach and duodenum: Secondary | ICD-10-CM | POA: Diagnosis not present

## 2022-11-06 DIAGNOSIS — K319 Disease of stomach and duodenum, unspecified: Secondary | ICD-10-CM | POA: Diagnosis not present

## 2022-11-06 DIAGNOSIS — Z950 Presence of cardiac pacemaker: Secondary | ICD-10-CM | POA: Diagnosis not present

## 2022-11-07 DIAGNOSIS — I5032 Chronic diastolic (congestive) heart failure: Secondary | ICD-10-CM | POA: Diagnosis not present

## 2022-11-07 NOTE — Telephone Encounter (Signed)
She has been on this long term. So we had a conversation about sleep hygiene and non-pharm interventions a long time ago.  You can choose yes.

## 2022-11-08 ENCOUNTER — Ambulatory Visit: Admission: RE | Admit: 2022-11-08 | Payer: Medicaid Other | Source: Ambulatory Visit

## 2022-11-08 ENCOUNTER — Ambulatory Visit (HOSPITAL_COMMUNITY): Payer: Medicaid Other

## 2022-11-08 DIAGNOSIS — R1310 Dysphagia, unspecified: Secondary | ICD-10-CM | POA: Diagnosis not present

## 2022-11-09 ENCOUNTER — Ambulatory Visit: Payer: Medicaid Other | Admitting: Cardiology

## 2022-11-10 DIAGNOSIS — Z4659 Encounter for fitting and adjustment of other gastrointestinal appliance and device: Secondary | ICD-10-CM | POA: Diagnosis not present

## 2022-11-10 DIAGNOSIS — R627 Adult failure to thrive: Secondary | ICD-10-CM | POA: Diagnosis not present

## 2022-11-10 DIAGNOSIS — R112 Nausea with vomiting, unspecified: Secondary | ICD-10-CM | POA: Diagnosis not present

## 2022-11-10 DIAGNOSIS — Z950 Presence of cardiac pacemaker: Secondary | ICD-10-CM | POA: Diagnosis not present

## 2022-11-10 DIAGNOSIS — I63522 Cerebral infarction due to unspecified occlusion or stenosis of left anterior cerebral artery: Secondary | ICD-10-CM | POA: Diagnosis not present

## 2022-11-10 DIAGNOSIS — I6523 Occlusion and stenosis of bilateral carotid arteries: Secondary | ICD-10-CM | POA: Diagnosis not present

## 2022-11-10 DIAGNOSIS — I5032 Chronic diastolic (congestive) heart failure: Secondary | ICD-10-CM | POA: Diagnosis not present

## 2022-11-11 DIAGNOSIS — K224 Dyskinesia of esophagus: Secondary | ICD-10-CM | POA: Diagnosis not present

## 2022-11-11 DIAGNOSIS — J9621 Acute and chronic respiratory failure with hypoxia: Secondary | ICD-10-CM | POA: Diagnosis not present

## 2022-11-11 DIAGNOSIS — E46 Unspecified protein-calorie malnutrition: Secondary | ICD-10-CM | POA: Diagnosis not present

## 2022-11-11 DIAGNOSIS — R1314 Dysphagia, pharyngoesophageal phase: Secondary | ICD-10-CM | POA: Diagnosis not present

## 2022-11-11 DIAGNOSIS — T17908S Unspecified foreign body in respiratory tract, part unspecified causing other injury, sequela: Secondary | ICD-10-CM | POA: Diagnosis not present

## 2022-11-11 DIAGNOSIS — R112 Nausea with vomiting, unspecified: Secondary | ICD-10-CM | POA: Diagnosis not present

## 2022-11-11 DIAGNOSIS — J9622 Acute and chronic respiratory failure with hypercapnia: Secondary | ICD-10-CM | POA: Diagnosis not present

## 2022-11-12 DIAGNOSIS — J9611 Chronic respiratory failure with hypoxia: Secondary | ICD-10-CM | POA: Diagnosis not present

## 2022-11-12 DIAGNOSIS — R1314 Dysphagia, pharyngoesophageal phase: Secondary | ICD-10-CM | POA: Diagnosis not present

## 2022-11-12 DIAGNOSIS — Z4659 Encounter for fitting and adjustment of other gastrointestinal appliance and device: Secondary | ICD-10-CM | POA: Diagnosis not present

## 2022-11-12 DIAGNOSIS — J9612 Chronic respiratory failure with hypercapnia: Secondary | ICD-10-CM | POA: Diagnosis not present

## 2022-11-13 DIAGNOSIS — J9612 Chronic respiratory failure with hypercapnia: Secondary | ICD-10-CM | POA: Diagnosis not present

## 2022-11-13 DIAGNOSIS — R1314 Dysphagia, pharyngoesophageal phase: Secondary | ICD-10-CM | POA: Diagnosis not present

## 2022-11-13 DIAGNOSIS — J9611 Chronic respiratory failure with hypoxia: Secondary | ICD-10-CM | POA: Diagnosis not present

## 2022-11-13 NOTE — Telephone Encounter (Signed)
PA sent to the plan today.

## 2022-11-13 NOTE — Telephone Encounter (Signed)
Outcome Approved today by [facility] [place] Medicaid [place] Case: [id number], Status: Approved, Coverage Starts on: 11/13/2022 12:00:00 AM, Coverage Ends on: 05/12/2023 12:00:00 AM. Authorization Expiration Date: 05/11/2023 Drug Zolpidem Tartrate 5MG  tablets

## 2022-11-14 DIAGNOSIS — Z4659 Encounter for fitting and adjustment of other gastrointestinal appliance and device: Secondary | ICD-10-CM | POA: Diagnosis not present

## 2022-11-14 DIAGNOSIS — R1310 Dysphagia, unspecified: Secondary | ICD-10-CM | POA: Diagnosis not present

## 2022-11-14 DIAGNOSIS — R0902 Hypoxemia: Secondary | ICD-10-CM | POA: Diagnosis not present

## 2022-11-14 DIAGNOSIS — R1314 Dysphagia, pharyngoesophageal phase: Secondary | ICD-10-CM | POA: Diagnosis not present

## 2022-11-14 DIAGNOSIS — J9612 Chronic respiratory failure with hypercapnia: Secondary | ICD-10-CM | POA: Diagnosis not present

## 2022-11-14 DIAGNOSIS — J9611 Chronic respiratory failure with hypoxia: Secondary | ICD-10-CM | POA: Diagnosis not present

## 2022-11-15 DIAGNOSIS — J9612 Chronic respiratory failure with hypercapnia: Secondary | ICD-10-CM | POA: Diagnosis not present

## 2022-11-15 DIAGNOSIS — R1319 Other dysphagia: Secondary | ICD-10-CM | POA: Diagnosis not present

## 2022-11-15 DIAGNOSIS — J9611 Chronic respiratory failure with hypoxia: Secondary | ICD-10-CM | POA: Diagnosis not present

## 2022-11-15 DIAGNOSIS — R1314 Dysphagia, pharyngoesophageal phase: Secondary | ICD-10-CM | POA: Diagnosis not present

## 2022-11-16 DIAGNOSIS — R531 Weakness: Secondary | ICD-10-CM | POA: Diagnosis not present

## 2022-11-16 DIAGNOSIS — J9611 Chronic respiratory failure with hypoxia: Secondary | ICD-10-CM | POA: Diagnosis not present

## 2022-11-16 DIAGNOSIS — J9811 Atelectasis: Secondary | ICD-10-CM | POA: Diagnosis not present

## 2022-11-16 DIAGNOSIS — I5032 Chronic diastolic (congestive) heart failure: Secondary | ICD-10-CM | POA: Diagnosis not present

## 2022-11-16 DIAGNOSIS — I270 Primary pulmonary hypertension: Secondary | ICD-10-CM | POA: Diagnosis not present

## 2022-11-16 DIAGNOSIS — J90 Pleural effusion, not elsewhere classified: Secondary | ICD-10-CM | POA: Diagnosis not present

## 2022-11-16 DIAGNOSIS — Z9911 Dependence on respirator [ventilator] status: Secondary | ICD-10-CM | POA: Diagnosis not present

## 2022-11-16 DIAGNOSIS — R1314 Dysphagia, pharyngoesophageal phase: Secondary | ICD-10-CM | POA: Diagnosis not present

## 2022-11-16 DIAGNOSIS — J9622 Acute and chronic respiratory failure with hypercapnia: Secondary | ICD-10-CM | POA: Diagnosis not present

## 2022-11-16 DIAGNOSIS — J9612 Chronic respiratory failure with hypercapnia: Secondary | ICD-10-CM | POA: Diagnosis not present

## 2022-11-17 DIAGNOSIS — R531 Weakness: Secondary | ICD-10-CM | POA: Diagnosis not present

## 2022-11-17 DIAGNOSIS — J9611 Chronic respiratory failure with hypoxia: Secondary | ICD-10-CM | POA: Diagnosis not present

## 2022-11-17 DIAGNOSIS — J9612 Chronic respiratory failure with hypercapnia: Secondary | ICD-10-CM | POA: Diagnosis not present

## 2022-11-18 DIAGNOSIS — Z789 Other specified health status: Secondary | ICD-10-CM | POA: Diagnosis not present

## 2022-11-18 DIAGNOSIS — M79604 Pain in right leg: Secondary | ICD-10-CM | POA: Diagnosis not present

## 2022-11-18 DIAGNOSIS — R1314 Dysphagia, pharyngoesophageal phase: Secondary | ICD-10-CM | POA: Diagnosis not present

## 2022-11-18 DIAGNOSIS — M79605 Pain in left leg: Secondary | ICD-10-CM | POA: Diagnosis not present

## 2022-11-18 DIAGNOSIS — J984 Other disorders of lung: Secondary | ICD-10-CM | POA: Diagnosis not present

## 2022-11-18 DIAGNOSIS — R112 Nausea with vomiting, unspecified: Secondary | ICD-10-CM | POA: Diagnosis not present

## 2022-11-18 DIAGNOSIS — J9611 Chronic respiratory failure with hypoxia: Secondary | ICD-10-CM | POA: Diagnosis not present

## 2022-11-18 DIAGNOSIS — J9612 Chronic respiratory failure with hypercapnia: Secondary | ICD-10-CM | POA: Diagnosis not present

## 2022-11-19 DIAGNOSIS — Z789 Other specified health status: Secondary | ICD-10-CM | POA: Diagnosis not present

## 2022-11-19 DIAGNOSIS — J9612 Chronic respiratory failure with hypercapnia: Secondary | ICD-10-CM | POA: Diagnosis not present

## 2022-11-19 DIAGNOSIS — J984 Other disorders of lung: Secondary | ICD-10-CM | POA: Diagnosis not present

## 2022-11-19 DIAGNOSIS — R112 Nausea with vomiting, unspecified: Secondary | ICD-10-CM | POA: Diagnosis not present

## 2022-11-19 DIAGNOSIS — R1314 Dysphagia, pharyngoesophageal phase: Secondary | ICD-10-CM | POA: Diagnosis not present

## 2022-11-19 DIAGNOSIS — J9611 Chronic respiratory failure with hypoxia: Secondary | ICD-10-CM | POA: Diagnosis not present

## 2022-11-20 ENCOUNTER — Telehealth (HOSPITAL_COMMUNITY): Payer: Self-pay | Admitting: *Deleted

## 2022-11-20 DIAGNOSIS — J9611 Chronic respiratory failure with hypoxia: Secondary | ICD-10-CM | POA: Diagnosis not present

## 2022-11-20 DIAGNOSIS — J9612 Chronic respiratory failure with hypercapnia: Secondary | ICD-10-CM | POA: Diagnosis not present

## 2022-11-20 NOTE — Telephone Encounter (Signed)
Patient's daughter called to say that her mother is currently admitted at [facility] and will not be able to come to her cardiac MRI appointment.  They will call back to reschedule if the scan is still needed.  Larey Brick RN Navigator Cardiac Imaging [facility] [phone] Office [phone] Cell

## 2022-11-20 NOTE — Telephone Encounter (Signed)
 Attempted to call patient regarding upcoming cardiac MRI appointment. Left message on voicemail with name and callback number Johney Frame RN Navigator Cardiac Imaging [facility] [phone] Office  Instructions sent via Mychart.

## 2022-11-21 ENCOUNTER — Ambulatory Visit (HOSPITAL_COMMUNITY): Admission: RE | Admit: 2022-11-21 | Payer: Medicaid Other | Source: Ambulatory Visit

## 2022-11-21 DIAGNOSIS — J9612 Chronic respiratory failure with hypercapnia: Secondary | ICD-10-CM | POA: Diagnosis not present

## 2022-11-21 DIAGNOSIS — J9611 Chronic respiratory failure with hypoxia: Secondary | ICD-10-CM | POA: Diagnosis not present

## 2022-11-22 DIAGNOSIS — J9611 Chronic respiratory failure with hypoxia: Secondary | ICD-10-CM | POA: Diagnosis not present

## 2022-11-22 DIAGNOSIS — J9612 Chronic respiratory failure with hypercapnia: Secondary | ICD-10-CM | POA: Diagnosis not present

## 2022-11-23 DIAGNOSIS — J9611 Chronic respiratory failure with hypoxia: Secondary | ICD-10-CM | POA: Diagnosis not present

## 2022-11-23 DIAGNOSIS — J9612 Chronic respiratory failure with hypercapnia: Secondary | ICD-10-CM | POA: Diagnosis not present

## 2022-11-23 DIAGNOSIS — R9089 Other abnormal findings on diagnostic imaging of central nervous system: Secondary | ICD-10-CM | POA: Diagnosis not present

## 2022-11-23 DIAGNOSIS — I5032 Chronic diastolic (congestive) heart failure: Secondary | ICD-10-CM | POA: Diagnosis not present

## 2022-11-24 ENCOUNTER — Encounter: Payer: Medicaid Other | Admitting: Cardiology

## 2022-11-24 DIAGNOSIS — J9611 Chronic respiratory failure with hypoxia: Secondary | ICD-10-CM | POA: Diagnosis not present

## 2022-11-24 DIAGNOSIS — J9612 Chronic respiratory failure with hypercapnia: Secondary | ICD-10-CM | POA: Diagnosis not present

## 2022-11-24 DIAGNOSIS — I5032 Chronic diastolic (congestive) heart failure: Secondary | ICD-10-CM | POA: Diagnosis not present

## 2022-11-25 DIAGNOSIS — J9611 Chronic respiratory failure with hypoxia: Secondary | ICD-10-CM | POA: Diagnosis not present

## 2022-11-25 DIAGNOSIS — J9612 Chronic respiratory failure with hypercapnia: Secondary | ICD-10-CM | POA: Diagnosis not present

## 2022-11-26 DIAGNOSIS — J9611 Chronic respiratory failure with hypoxia: Secondary | ICD-10-CM | POA: Diagnosis not present

## 2022-11-26 DIAGNOSIS — J9612 Chronic respiratory failure with hypercapnia: Secondary | ICD-10-CM | POA: Diagnosis not present

## 2022-11-27 DIAGNOSIS — J9611 Chronic respiratory failure with hypoxia: Secondary | ICD-10-CM | POA: Diagnosis not present

## 2022-11-27 DIAGNOSIS — I5032 Chronic diastolic (congestive) heart failure: Secondary | ICD-10-CM | POA: Diagnosis not present

## 2022-11-27 DIAGNOSIS — J9612 Chronic respiratory failure with hypercapnia: Secondary | ICD-10-CM | POA: Diagnosis not present

## 2022-11-28 DIAGNOSIS — I471 Supraventricular tachycardia, unspecified: Secondary | ICD-10-CM | POA: Diagnosis not present

## 2022-11-28 DIAGNOSIS — F331 Major depressive disorder, recurrent, moderate: Secondary | ICD-10-CM | POA: Diagnosis not present

## 2022-11-28 DIAGNOSIS — J9611 Chronic respiratory failure with hypoxia: Secondary | ICD-10-CM | POA: Diagnosis not present

## 2022-11-28 DIAGNOSIS — J9612 Chronic respiratory failure with hypercapnia: Secondary | ICD-10-CM | POA: Diagnosis not present

## 2022-11-28 DIAGNOSIS — G4734 Idiopathic sleep related nonobstructive alveolar hypoventilation: Secondary | ICD-10-CM | POA: Diagnosis not present

## 2022-11-29 DIAGNOSIS — J9611 Chronic respiratory failure with hypoxia: Secondary | ICD-10-CM | POA: Diagnosis not present

## 2022-11-29 DIAGNOSIS — R1314 Dysphagia, pharyngoesophageal phase: Secondary | ICD-10-CM | POA: Diagnosis not present

## 2022-11-29 DIAGNOSIS — J9612 Chronic respiratory failure with hypercapnia: Secondary | ICD-10-CM | POA: Diagnosis not present

## 2022-11-30 DIAGNOSIS — I480 Paroxysmal atrial fibrillation: Secondary | ICD-10-CM | POA: Diagnosis not present

## 2022-11-30 DIAGNOSIS — I441 Atrioventricular block, second degree: Secondary | ICD-10-CM | POA: Diagnosis not present

## 2022-11-30 DIAGNOSIS — I484 Atypical atrial flutter: Secondary | ICD-10-CM | POA: Diagnosis not present

## 2022-11-30 DIAGNOSIS — I5032 Chronic diastolic (congestive) heart failure: Secondary | ICD-10-CM | POA: Diagnosis not present

## 2022-11-30 DIAGNOSIS — I4891 Unspecified atrial fibrillation: Secondary | ICD-10-CM | POA: Diagnosis not present

## 2022-11-30 DIAGNOSIS — J9612 Chronic respiratory failure with hypercapnia: Secondary | ICD-10-CM | POA: Diagnosis not present

## 2022-11-30 DIAGNOSIS — Z7901 Long term (current) use of anticoagulants: Secondary | ICD-10-CM | POA: Diagnosis not present

## 2022-11-30 DIAGNOSIS — I4819 Other persistent atrial fibrillation: Secondary | ICD-10-CM | POA: Diagnosis not present

## 2022-11-30 DIAGNOSIS — I471 Supraventricular tachycardia, unspecified: Secondary | ICD-10-CM | POA: Diagnosis not present

## 2022-11-30 DIAGNOSIS — I10 Essential (primary) hypertension: Secondary | ICD-10-CM | POA: Diagnosis not present

## 2022-11-30 DIAGNOSIS — J9611 Chronic respiratory failure with hypoxia: Secondary | ICD-10-CM | POA: Diagnosis not present

## 2022-11-30 DIAGNOSIS — Z9889 Other specified postprocedural states: Secondary | ICD-10-CM | POA: Diagnosis not present

## 2022-11-30 DIAGNOSIS — Z79899 Other long term (current) drug therapy: Secondary | ICD-10-CM | POA: Diagnosis not present

## 2022-12-01 ENCOUNTER — Encounter: Payer: Self-pay | Admitting: Dermatology

## 2022-12-01 ENCOUNTER — Telehealth: Payer: Self-pay

## 2022-12-01 ENCOUNTER — Other Ambulatory Visit: Payer: Self-pay

## 2022-12-01 DIAGNOSIS — R1314 Dysphagia, pharyngoesophageal phase: Secondary | ICD-10-CM | POA: Diagnosis not present

## 2022-12-01 DIAGNOSIS — G1229 Other motor neuron disease: Secondary | ICD-10-CM | POA: Diagnosis not present

## 2022-12-01 DIAGNOSIS — J984 Other disorders of lung: Secondary | ICD-10-CM | POA: Diagnosis not present

## 2022-12-01 MED ORDER — TORSEMIDE 20 MG PO TABS: 20.0000 mg | ORAL_TABLET | ORAL | 3 refills | Status: DC

## 2022-12-01 NOTE — Patient Instructions (Signed)
Visit Information  Dear Liborio Nixon,  Thank you for taking time to visit with me today. Please don't hesitate to contact me if I can be of assistance to you before our next scheduled telephone appointment.  Our next appointment is by telephone on Wednesday, Sept 4 at 2pm  Following is a copy of your care plan:   We will develop an individualized short term care plan together at our next visit on 12/06/2022    Goals Addressed   None     Patient verbalizes understanding of instructions and care plan provided today and agrees to view in MyChart. Active MyChart status and patient understanding of how to access instructions and care plan via MyChart confirmed with patient.     Telephone follow up appointment with care management team member scheduled for: 9/4, 2pm  Please call the care guide team at [phone] if you need to cancel or reschedule your appointment.   Please call 1-800-273-TALK (toll free, 24 hour hotline) if you are experiencing a Mental Health or Behavioral Health Crisis or need someone to talk to.  [name], RN, BA, [facility], CRRN [facility] Coordinator, Transition of Care Ph # [phone]

## 2022-12-01 NOTE — Transitions of Care (Post Inpatient/ED Visit) (Signed)
12/01/2022  Name: Julia Stark MRN: [id number] DOB: 1960-09-13  Today's TOC FU Call Status: Today's TOC FU Call Status:: Successful TOC FU Call Completed TOC FU Call Complete Date: 12/01/22 Patient's Name and Date of Birth confirmed.  Transition Care Management Follow-up Telephone Call Date of Discharge: 11/30/22 Type of Discharge: Inpatient Admission Primary Inpatient Discharge Diagnosis:: [facility] [employer] How have you been since you were released from the hospital?: Same Any questions or concerns?: Yes Patient Questions/Concerns:: Price of medications when need refills, was sent home with initial prescriptions Patient Questions/Concerns Addressed: Other: (Will f/u with needs re possible RPh involvment during next scheduled call on 9/4, encouraged pt to speak to MDs at follow up PCP and HF Clinic appts on Sept 3 re future cost of meds)  Items Reviewed: Did you receive and understand the discharge instructions provided?: Yes Medications obtained,verified, and reconciled?: Partial Review Completed Reason for Partial Mediation Review: Time constraint of first call Any new allergies since your discharge?: No Dietary orders reviewed?: Yes Type of Diet Ordered:: reviewed tube feeding orders which she administers herself Do you have support at home?: Yes  Medications Reviewed Today: Medications Reviewed Today     Reviewed by Marcos Eke, RN (Registered Nurse) on 12/01/22 at 1621  Med List Status: <None>   Medication Order Taking? Sig Documenting Provider Last Dose Status Informant  ALPRAZolam (XANAX) 0.25 MG tablet [id number] No Take 1 tablet (0.25 mg total) by mouth 2 (two) times daily as needed for anxiety. [name], MD Taking Active   apixaban (ELIQUIS) 5 MG TABS tablet [id number] No Take 1 tablet (5 mg total) by mouth 2 (two) times daily. Laurey Morale, MD Taking Active   cholecalciferol (VITAMIN D3) 25 MCG (1000 UNIT) tablet [id number] No Take 1,000 Units by  mouth daily. [provider] Taking Active   dapagliflozin propanediol (FARXIGA) 10 MG TABS tablet [id number] No Take 1 tablet (10 mg total) by mouth daily before breakfast. Laurey Morale, MD Taking Active   HYDROcodone-acetaminophen (NORCO/VICODIN) 5-325 MG tablet [id number] No Take 2 tablets by mouth every 4 (four) hours as needed.  Patient not taking: Reported on 11/02/2022   Rondel Baton, MD Not Taking Active   Iron, Ferrous Sulfate, 325 (65 Fe) MG TABS [id number] No Take 325 mg by mouth daily. [name], MD Taking Active Self  linaclotide Karlene Einstein) 72 MCG capsule [id number] No Take 72 mcg by mouth daily as needed. [provider] Taking Active   metoCLOPramide (REGLAN) 10 MG tablet [id number] No Take 1 tablet (10 mg total) by mouth every 6 (six) hours. Rondel Baton, MD Taking Active   omeprazole (PRILOSEC OTC) 20 MG tablet [id number] No Take 20 mg by mouth in the morning and at bedtime. [provider] Taking Active Self  rosuvastatin (CRESTOR) 5 MG tablet [id number] No Take 1 tablet (5 mg total) by mouth every other day. [name], MD Taking Active   spironolactone (ALDACTONE) 25 MG tablet [id number] No Take 1 tablet (25 mg total) by mouth daily. Laurey Morale, MD Taking Active   torsemide (DEMADEX) 20 MG tablet [id number]  Take 1 tablet (20 mg total) by mouth every other day. [name] A, FNP  Active   venlafaxine XR (EFFEXOR-XR) 150 MG 24 hr capsule [id number] No TAKE 1 CAPSULE BY MOUTH ONCE DAILY WITH BREAKFAST Bacigalupo, Marzella Schlein, MD Taking Active   zolpidem (AMBIEN) 5 MG tablet [id number] No Take 1 tablet (5 mg total) by mouth at bedtime  as needed. for sleep Bacigalupo, Marzella Schlein, MD Taking Active             Home Care and Equipment/Supplies: Were Home Health Services Ordered?: Yes Name of Home Health Agency:: [facility] Has Agency set up a time to come to your home?: Yes First Home Health Visit Date:  12/01/22  Functional Questionnaire:    Follow up appointments reviewed: PCP Follow-up appointment confirmed?: Yes Date of PCP follow-up appointment?: 12/05/22 Follow-up Provider: Dr. [name] Specialist [facility] Follow-up appointment confirmed?: Yes Date of Specialist follow-up appointment?: 12/05/22 Follow-Up Specialty Provider:: [facility] at [facility] Do you need transportation to your follow-up appointment?: No Do you understand care options if your condition(s) worsen?: Yes-patient verbalized understanding  We will develop an individualized short term care plan together at our next visit on 12/06/2022 @2pm   Warm Regards, Franki Monte, RN, BA, [facility], CRRN [facility] Health Care Management Coordinator Ph # [phone]

## 2022-12-04 DIAGNOSIS — R0902 Hypoxemia: Secondary | ICD-10-CM | POA: Diagnosis not present

## 2022-12-05 ENCOUNTER — Ambulatory Visit: Payer: Medicaid Other | Admitting: Family Medicine

## 2022-12-05 ENCOUNTER — Encounter: Payer: Self-pay | Admitting: Family

## 2022-12-05 ENCOUNTER — Ambulatory Visit: Payer: Medicaid Other | Attending: Cardiology | Admitting: Family

## 2022-12-05 ENCOUNTER — Encounter: Payer: Self-pay | Admitting: Family Medicine

## 2022-12-05 VITALS — BP 126/45 | HR 70 | Resp 14 | Wt 134.0 lb

## 2022-12-05 VITALS — BP 133/48 | HR 70 | Temp 97.8°F | Resp 24 | Ht 68.0 in | Wt 134.9 lb

## 2022-12-05 DIAGNOSIS — Z7901 Long term (current) use of anticoagulants: Secondary | ICD-10-CM | POA: Insufficient documentation

## 2022-12-05 DIAGNOSIS — I5032 Chronic diastolic (congestive) heart failure: Secondary | ICD-10-CM

## 2022-12-05 DIAGNOSIS — T148XXA Other injury of unspecified body region, initial encounter: Secondary | ICD-10-CM | POA: Insufficient documentation

## 2022-12-05 DIAGNOSIS — I2721 Secondary pulmonary arterial hypertension: Secondary | ICD-10-CM | POA: Diagnosis not present

## 2022-12-05 DIAGNOSIS — F411 Generalized anxiety disorder: Secondary | ICD-10-CM | POA: Diagnosis not present

## 2022-12-05 DIAGNOSIS — Z79899 Other long term (current) drug therapy: Secondary | ICD-10-CM | POA: Diagnosis not present

## 2022-12-05 DIAGNOSIS — I428 Other cardiomyopathies: Secondary | ICD-10-CM | POA: Diagnosis present

## 2022-12-05 DIAGNOSIS — I272 Pulmonary hypertension, unspecified: Secondary | ICD-10-CM | POA: Diagnosis not present

## 2022-12-05 DIAGNOSIS — F331 Major depressive disorder, recurrent, moderate: Secondary | ICD-10-CM | POA: Diagnosis not present

## 2022-12-05 DIAGNOSIS — R1319 Other dysphagia: Secondary | ICD-10-CM | POA: Diagnosis not present

## 2022-12-05 DIAGNOSIS — Z5986 Financial insecurity: Secondary | ICD-10-CM | POA: Insufficient documentation

## 2022-12-05 DIAGNOSIS — Z87891 Personal history of nicotine dependence: Secondary | ICD-10-CM | POA: Insufficient documentation

## 2022-12-05 DIAGNOSIS — G4733 Obstructive sleep apnea (adult) (pediatric): Secondary | ICD-10-CM | POA: Diagnosis not present

## 2022-12-05 DIAGNOSIS — Z9981 Dependence on supplemental oxygen: Secondary | ICD-10-CM | POA: Insufficient documentation

## 2022-12-05 DIAGNOSIS — J9622 Acute and chronic respiratory failure with hypercapnia: Secondary | ICD-10-CM | POA: Diagnosis not present

## 2022-12-05 DIAGNOSIS — I270 Primary pulmonary hypertension: Secondary | ICD-10-CM | POA: Insufficient documentation

## 2022-12-05 DIAGNOSIS — I10 Essential (primary) hypertension: Secondary | ICD-10-CM | POA: Diagnosis not present

## 2022-12-05 DIAGNOSIS — J9602 Acute respiratory failure with hypercapnia: Secondary | ICD-10-CM

## 2022-12-05 DIAGNOSIS — R627 Adult failure to thrive: Secondary | ICD-10-CM | POA: Diagnosis not present

## 2022-12-05 DIAGNOSIS — I480 Paroxysmal atrial fibrillation: Secondary | ICD-10-CM | POA: Diagnosis not present

## 2022-12-05 DIAGNOSIS — I4821 Permanent atrial fibrillation: Secondary | ICD-10-CM | POA: Insufficient documentation

## 2022-12-05 DIAGNOSIS — R079 Chest pain, unspecified: Secondary | ICD-10-CM | POA: Diagnosis not present

## 2022-12-05 DIAGNOSIS — J90 Pleural effusion, not elsewhere classified: Secondary | ICD-10-CM

## 2022-12-05 DIAGNOSIS — K224 Dyskinesia of esophagus: Secondary | ICD-10-CM | POA: Insufficient documentation

## 2022-12-05 DIAGNOSIS — H9311 Tinnitus, right ear: Secondary | ICD-10-CM | POA: Diagnosis not present

## 2022-12-05 DIAGNOSIS — Z87440 Personal history of urinary (tract) infections: Secondary | ICD-10-CM | POA: Diagnosis not present

## 2022-12-05 DIAGNOSIS — J9601 Acute respiratory failure with hypoxia: Secondary | ICD-10-CM

## 2022-12-05 DIAGNOSIS — I110 Hypertensive heart disease with heart failure: Secondary | ICD-10-CM | POA: Insufficient documentation

## 2022-12-05 DIAGNOSIS — D649 Anemia, unspecified: Secondary | ICD-10-CM

## 2022-12-05 DIAGNOSIS — Z8249 Family history of ischemic heart disease and other diseases of the circulatory system: Secondary | ICD-10-CM | POA: Insufficient documentation

## 2022-12-05 MED ORDER — VENLAFAXINE HCL 100 MG PO TABS: 100.0000 mg | ORAL_TABLET | Freq: Two times a day (BID) | ORAL | 2 refills | Status: DC

## 2022-12-05 NOTE — Assessment & Plan Note (Signed)
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 during previous surgery lead to restrictive lung disease  Currently on 2 L O2 at baseline, PT will work to decrease O2 as tolerated  Use BiPAP at night  Follow up with pulmonology outpatient

## 2022-12-05 NOTE — Assessment & Plan Note (Signed)
Chronic Worsening due to recent hospitalization  Increase Effexor to 100 mg BID

## 2022-12-05 NOTE — Progress Notes (Signed)
PCP: [name], MD (last seen 09/24) Primary Cardiologist: Dorothyann Peng, MD (last seen 01/24) HF MD: Marca Ancona, MD (last seen 08/24)  HPI:  Julia Stark is a 62 y.o. female with history of permanent atrial fibrillation, diastolic CHF, and diabetes.Patient has a long history of atrial fibrillation.  She has failed multiple anti-arrhythmic agents (flecainide, amiodarone, dronedarone, and propafenone).  She had an initial ablation in 5/21, then a redo ablation in 8/22.  At the time of 08/22 ablation, foci of atrial tachycardia were noted that were not ablated due to proximity to the AV node. 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  She had ongoing atrial arrhythmia episodes and underwent convergent endocardial/epicardial ablation at [facility] in 9/23 with left atrial appendage clipping.  This was complicated by ileus and then left hemothorax. She developed a recurrent left pleural effusion and ended up with left VATS in 10/23.  Atrial fibrillation recurred, and ultimately, she had AV nodal ablation with [facility] [employer] DDD pacemaker with left bundle lead placed.    Since her surgery, she has had chronic pain radiating from left lower chest to upper back. Primarily located at the site where she had a chest tube at time of hemothorax.  Pain is pleuritic. She was started on colchicine which initially helped but eventually stopped helping and she has stopped the medication.    She has been chronically short of breath since her surgery in 09/23.  Hemodynamic RHC/LHC in 4/24 showed no evidence for ventricular interdependence.  This was not suggestive of constrictive pericarditis.  Echo 04/24 showed EF 50-55%, moderate RV dysfunction, moderate RV enlargement, PASP 46 mmHg, mild-moderate TR; no evidence for constrictive pericarditis.   She has had dysphagia and has been diagnosed by GI with esophageal dysmotility. She tried a medication for esophageal dysmotility but it  caused GI upset so she stopped it.   Admitted 11/04/22 due to 4 to 6 months of progressively worsening dysphagia and weight loss. She feels like food gets stuck in her upper chest/lower neck, and it takes sometimes hours for it to feel like it fully makes its way to her stomach. This sensation takes place about a minute after she swallows. She does not have any coughing with food or drink. G-tube placement on 8/14. VBG was 7.23/99 and improved with adherence to BIPAP. Neurology was consulted for concern for NMJ pathology. EMG with repetitive nerve stimulation was essentially normal, making NMJ pathology unlikely. Diaphragm fluoroscopy on 8/13 demonstrating abnormally decreased diaphragmatic excursion, left worse than right without paradoxical motion of sniff test. Neg acetylcholine receptor antibody. Arranged for her to have ASTRAL VENT with f/u with Dr Danton Sewer in Vent clinic on 12/29/22. EGD with FLIP on 8/5 demonstrating suspicion for abnormal contractility, recommendation was for formal esophageal manometry at outpatient follow-up. She has follow up with GI on 12/28/22. Videofluoroscopy on 8/7 demonstrated esophageal retention, aspiration, and oropharyngeal aspiration with pharyngeal muscle weakness, accordingly with unsafe swallow initially. G-tube was placed on 8/14. Tolerating tube feeds.she will be discharged on TF 140 ml nutren 2 : 6 feeds/day,each feed over 1 hour through pump and 30 ml flushes. MRI /MRA brain done to evaluate dysphagia but showed stenosis and possible small dural AVF.It showed chronic small vessel disease. CTA neck and brain done to follow on this could not confirm or rule it out dural AVF but did not show stenosis. Neurosurgery consulted and did not recommend MRV and they felt risk of MRV since it will be done under  GA( patient is claustrophobic and hypercarbic) outweigh the benefit. This all resulted in a lengthy admission of 26 days.  Echo 07/14/22: Near-normal LVEF.  However, there are  signs of RV failure.  This does not look like definite constrictive pericarditis by echo. Suspect primarily RV failure.  Plan for cath in future.   RHC/LHC 07/28/22:  1. Normal filling pressures.  2. Mild to moderate pulmonary arterial hypertension (PVR not markedly high, only 3.5 WU).  3. Preserved cardiac output.  4. No interventricular interdependence with simultaneous RV/LV pressure tracings, no evidence for constrictive pericarditis.   She presents today for a HF follow-up visit with a chief complaint of moderate SOB with minimal fatigue. Has associated fatigue, "whooshing" in her right ear, chronic chest pain, pedal edema, palpitations and intermittent dizziness. She denies abdominal distention and difficulty sleeping. Normally doesn't have much swelling in her feet but today she's had home PT as well as an appt at her PCP office and then here. Admits that she doesn't elevate her legs much because she feels uncomfortable and antsy when doing this. Has only been home for 4 days so still feels quite tired/ weak.   Does endorse feeling hungry at night and needs to get in touch with GI regarding her tube feedings.   Currently getting PT, OT and home health nursing.   ROS: All systems negative except as listed in HPI, PMH and Problem List.  SH:  Social History   Socioeconomic History   Marital status: Divorced    Spouse name: Not on file   Number of children: 3   Years of education: 12   Highest education level: Some college, no degree  Occupational History    Employer: [employer]    Comment: in special chemistry  Tobacco Use   Smoking status: Former    Types: Cigarettes   Smokeless tobacco: Never  Vaping Use   Vaping status: Never Used  Substance and Sexual Activity   Alcohol use: Not Currently    Alcohol/week: 4.0 standard drinks of alcohol    Types: 2 Glasses of wine, 2 Cans of beer per week   Drug use: No   Sexual activity: Yes    Partners: Male    Birth control/protection:  Post-menopausal, Surgical  Other Topics Concern   Not on file  Social History Narrative   Not on file   Social Determinants of Health   Financial Resource Strain: Low Risk  (12/01/2022)   Received from [facility]   Overall Financial Resource Strain (CARDIA)    Difficulty of Paying Living Expenses: Not very hard  Recent Concern: Financial Resource Strain - Medium Risk (12/01/2022)   Overall Financial Resource Strain (CARDIA)    Difficulty of Paying Living Expenses: Somewhat hard  Food Insecurity: No Food Insecurity (12/01/2022)   Received from [facility]   Hunger Vital Sign    Worried About Running Out of Food in the Last Year: Never true    Ran Out of Food in the Last Year: Never true  Recent Concern: Food Insecurity - Food Insecurity Present (10/02/2022)   Hunger Vital Sign    Worried About Running Out of Food in the Last Year: Sometimes true    Ran Out of Food in the Last Year: Never true  Transportation Needs: No Transportation Needs (12/01/2022)   PRAPARE - Administrator, Civil Service (Medical): No    Lack of Transportation (Non-Medical): No  Physical Activity: Inactive (12/01/2022)   Exercise Vital  Sign    Days of Exercise per Week: 0 days    Minutes of Exercise per Session: 0 min  Stress: Stress Concern Present (10/02/2022)   [employer] of Occupational Health - Occupational Stress Questionnaire    Feeling of Stress : To some extent  Social Connections: Socially Isolated (10/02/2022)   Social Connection and Isolation Panel [NHANES]    Frequency of Communication with Friends and Family: More than three times a week    Frequency of Social Gatherings with Friends and Family: Once a week    Attends Religious Services: Never    Database administrator or Organizations: No    Attends Engineer, structural: Not on file    Marital Status: Divorced  Catering manager Violence: Not on file    FH:  Family History  Problem  Relation Age of Onset   Diabetes Mother    Hypertension Mother    Skin cancer Mother    Depression Mother    Alzheimer's disease Father    Diabetes Sister    Depression Sister    Diabetes Brother    Colon cancer Maternal Uncle    Hypertension Maternal Grandfather    Hyperlipidemia Maternal Grandfather    Diabetes Paternal Grandmother    Heart disease Paternal Grandmother    Prostate cancer Paternal Grandfather    Ovarian cancer Cousin    Breast cancer Neg Hx    Cervical cancer Neg Hx     Past Medical History:  Diagnosis Date   Actinic keratosis 07/19/2022   L chin - bx proven, needs LN2   Anxiety    Arthritis    bilateral shoulders   Atrial fibrillation (HCC)    Basal cell carcinoma    forehead   Cancer (HCC)    BASAL CELL CARCINOMA OF FACE   CHF (congestive heart failure) (HCC)    Colon polyp    COVID-19 11/26/2020   Depression    Diabetes mellitus without complication (HCC)    Dysrhythmia    A fib   Esophageal dysmotility    Hyperlipidemia    Hypertension    Internal hemorrhoids    Sialoadenitis 12/24/2018    Current Outpatient Medications  Medication Sig Dispense Refill   apixaban (ELIQUIS) 5 MG TABS tablet Take 1 tablet (5 mg total) by mouth 2 (two) times daily. 60 tablet 6   docusate (COLACE) 50 MG/5ML liquid Take 50 mg by mouth daily.     lidocaine 4 % Place 2 patches onto the skin.     linaclotide (LINZESS) 72 MCG capsule Take 72 mcg by mouth daily as needed.     metoCLOPramide (REGLAN) 5 MG/5ML solution Place 5 mg into feeding tube.     Multiple Vitamins-Minerals (TROPICAL LIQUID NUTRITION) LIQD Take 15 mLs by G tube once daily     omeprazole (PRILOSEC OTC) 20 MG tablet Take 20 mg by mouth in the morning and at bedtime.     rosuvastatin (CRESTOR) 5 MG tablet Take 1 tablet (5 mg total) by mouth every other day. 45 tablet 1   sennosides (SENOKOT) 8.8 MG/5ML syrup Place 5 mLs into feeding tube at bedtime as needed for mild constipation.     spironolactone  (ALDACTONE) 25 MG tablet Take 1 tablet (25 mg total) by mouth daily. 90 tablet 3   Torsemide 40 MG TABS Take by mouth.     venlafaxine (EFFEXOR) 100 MG tablet Take 1 tablet (100 mg total) by mouth 2 (two) times daily with a meal. 60 tablet  2   No current facility-administered medications for this visit.   Vitals:   12/05/22 1308  BP: (!) 126/45  Pulse: 70  Resp: 14  SpO2: 100%  Weight: 134 lb (60.8 kg)   Wt Readings from Last 3 Encounters:  12/05/22 134 lb (60.8 kg)  12/05/22 134 lb 14.4 oz (61.2 kg)  11/03/22 131 lb (59.4 kg)   Lab Results  Component Value Date   CREATININE 0.44 11/03/2022   CREATININE 0.55 10/22/2022   CREATININE 0.42 (L) 09/18/2022   PHYSICAL EXAM:  General:  Thin appearing No resp difficulty HEENT: normal Neck: supple. JVP flat. No lymphadenopathy or thryomegaly appreciated. Cor: PMI normal. Regular rate & rhythm. No rubs, gallops or murmurs. Lungs: clear Abdomen: soft, nontender, nondistended. No hepatosplenomegaly. No bruits or masses.  Extremities: no cyanosis, clubbing, rash, 1 + pitting edema bilaterally with L>R Neuro: alert & oriented x3, cranial nerves grossly intact. Moves all 4 extremities w/o difficulty. Affect pleasant.   ECG: not done   ASSESSMENT & PLAN:  1: NICM with preserved ejection fraction with LAE- - NYHA class III - euvolemic - weighing daily; reminded to call for an overnight weight gain of > 2 pounds or weekly weight gain of > 5 pounds - weight stable from last visit here 1 month ago - echo 01/12/22: EF of 50% along with severe LAE/ RAE and moderate TR.  - Echo 07/14/22: Near-normal LVEF.  However, there are signs of RV failure.  This does not look like definite constrictive pericarditis by echo. Suspect primarily RV failure.   - RHC/LHC 07/28/22:  1. Normal filling pressures.  2. Mild to moderate pulmonary arterial hypertension (PVR not markedly high, only 3.5 WU).  3. Preserved cardiac output.  4. No interventricular  interdependence with simultaneous RV/LV pressure tracings, no evidence for constrictive pericarditis.  - cMRI 05/20/19: The left ventricle is normal in cavity size and wall thickness. Global systolic function is normal. The LV ejection fraction is 61%. There are no regional wall motion abnormalities. The right ventricle is normal in cavity size, wall thickness, and systolic function. There is mild bilateral atrial dilatation. The aortic valve is trileaflet in morphology. There is no significant aortic valve stenosis or  regurgitation. There is no significant valvular disease. Delayed enhancement imaging demonstrates no evidence of myocardial infarction, scar or infiltrative disease.  - So far, imaging has not been consistent with constrictive pericarditis.  Situation looks more like RV failure.   - discussion had about repeating cMRI but she had a lengthy admission and just got home a few days ago - not adding salt and has been reading food labels for sodium content - tries to be active but difficult with her fatigue/ SOB - continue torsemide 40mg  daily - continue spironolactone 25mg  daily - resume wearing her compression socks daily with removal at bedtime - elevate her legs when she can - history of UTI's so not a candidate for SGLT2 - BNP 06/14/22 was 299.4  2: HTN- - BP 126/45 - saw PCP Beryle Flock) 09/24 & had lab work drawn - if CO2 is elevated, consider 2 week trial of acetazolamide 250mg  BID - BMP 11/30/22 showed sodium 141, potassium 4.6, creatinine 0.5 & GFR 106  3: PAF- - saw cardiothoracic surgeon ([name]) 03/24   - multiple previous ablations/DCCV  - s/p convergent procedure 12/14/21  - s/p DC PPM with LB area lead and AV node ablation 05/17/22 - continue eliquis 5mg  BID - saw cardiology [name]) 01/24  4: Pulmonary HTN- -  Mild-moderate with PVR 3.5 WU on 4/24 RHC - She has OSA which may contribute. - Has significant RV failure.   5: Esophageal dysmotility- - G-  tube placed 11/15/22 - TF 140 ml nutren 2 : 6 feeds/day,each feed over 1 hour through pump and 30 ml flushes  - hungry at night and will f/u with GI  6: Recurrent pleural effusions- - wearing oxygen at 2L around the clock - discussed pulmonary rehab after she finishes with home PT - Recurrent pleural effusions status post VATS/decortication (01/2022)  - she has follow up with Dr Danton Sewer in Vent clinic on 9/27   Return in 1 month, sooner if needed.

## 2022-12-05 NOTE — Assessment & Plan Note (Signed)
Improved since hospital dc  Currently on 2 L of O2 at baseline  Uses BiPAP for sleep  PT will work to wean O2 as tolerated

## 2022-12-05 NOTE — Assessment & Plan Note (Signed)
Chronic  Worsened during recent hospitalization  Hbg on dc was 8.2  Recheck CBC

## 2022-12-05 NOTE — Assessment & Plan Note (Signed)
Chronic  Established on cardiac cath Continue to monitor  Follow up with cardiology

## 2022-12-05 NOTE — Progress Notes (Signed)
Established Patient Office Visit  Subjective   Patient ID: Julia Stark, female    DOB: Aug 26, 1960  Age: 62 y.o. MRN: [id number]  Chief Complaint  Patient presents with   Hospitalization Follow-up    Julia Stark is here for a hospital follow up visit.  She was hospitalized at [facility] from 8/3 - 8/29. Due to unintentional weight loss due to esophageal dysmotility, hypercapnic respiratory failure, throbbing headache, tinnitus, and failure to thrive. During her stay she was found to have a restrictive lung injury. [name] currently on 2 L of Oxygen and is using BiPAP at night. For her esophageal dysmotility of an unclear cause, she is currently on G tube bolus feedings. [name] curious about if she can increase the amount of feedings due to feeling hungry. There was also one episode of melena that resolved while inpatient. Further workup was not done for the isolated episode. [name] scheduled to follow up with GI, neurosurgery, cardiology, and pulmonology. Home health, OT / PT, and speech are also scheduled.  [name] concerned about medication management and who is coordinating all of her medicines. [name] also complaining of a whooshing in her ear that has been happening since her hospital stay. Since her hospitalization her depression has worsened.  TOC call completed 12/01/22  Patient Active Problem List   Diagnosis Date Noted   Anemia 02/27/2022   Acquired thrombophilia (HCC) 07/14/2021   Status post radiofrequency ablation for arrhythmia 01/09/2021   On dronedarone therapy 11/16/2020   Renal stones 11/16/2020   Salivary gland calculi 11/16/2020   Ureteral stone 05/15/2020   Moderate episode of recurrent major depressive disorder (HCC) 12/29/2019   Overweight 12/04/2019   Basal cell carcinoma of face 10/03/2019   Typical atrial flutter (HCC) 08/15/2019   Lipoma of left upper extremity 08/04/2019   Primary insomnia 06/23/2019   Red blood cell antibody positive 05/22/2019   Chronic  anticoagulation 06/19/2018   Affective disorder, major 08/22/2017   Allergic rhinitis 08/22/2017   Anxiety, generalized 08/22/2017   T2DM (type 2 diabetes mellitus) (HCC) 08/22/2017   Hypertension associated with diabetes (HCC) 08/22/2017   Hyperlipidemia associated with type 2 diabetes mellitus (HCC) 08/22/2017   Mobitz type 2 second degree atrioventricular block 02/05/2017   Paroxysmal A-fib (HCC) 05/25/2015   Recurrent genital HSV (herpes simplex virus) infection 03/13/2014   Chronic constipation 02/17/2014   Reflux gastritis 02/17/2014   Adenomatous polyp of descending colon 02/12/2012      Review of Systems  Constitutional:  Positive for malaise/fatigue and weight loss.  HENT:  Positive for tinnitus.   Respiratory:  Positive for shortness of breath.   Gastrointestinal:  Negative for constipation and diarrhea.  Neurological:  Positive for weakness.  Psychiatric/Behavioral:  The patient is nervous/anxious.       Objective:     BP (!) 133/48 (BP Location: Left Arm, Patient Position: Sitting, Cuff Size: Normal)   Pulse 70   Temp 97.8 F (36.6 C) (Temporal)   Resp (!) 24   Ht 5\' 8"  (1.727 m)   Wt 134 lb 14.4 oz (61.2 kg)   LMP 04/13/2011   SpO2 99%   BMI 20.51 kg/m  BP Readings from Last 3 Encounters:  12/05/22 (!) 133/48  11/03/22 (!) 128/54  11/02/22 (!) 129/55   Wt Readings from Last 3 Encounters:  12/05/22 134 lb 14.4 oz (61.2 kg)  11/03/22 131 lb (59.4 kg)  11/02/22 133 lb (60.3 kg)      Physical Exam Constitutional:  Appearance: [name] ill-appearing.  Cardiovascular:     Rate and Rhythm: Normal rate and regular rhythm.     Heart sounds: Normal heart sounds.  Pulmonary:     Effort: Pulmonary effort is normal.     Breath sounds: Normal breath sounds.  Neurological:     General: No focal deficit present.     Mental Status: [name] oriented to person, place, and time.    No results found for any visits on 12/05/22.  Last CBC Lab Results   Component Value Date   WBC 4.7 11/03/2022   HGB 14.2 11/03/2022   HCT 45.2 11/03/2022   MCV 91.5 11/03/2022   MCH 28.7 11/03/2022   RDW 14.3 11/03/2022   PLT 195 11/03/2022   Last metabolic panel Lab Results  Component Value Date   GLUCOSE 122 (H) 11/03/2022   NA 134 (L) 11/03/2022   K 3.9 11/03/2022   CL 89 (L) 11/03/2022   CO2 32 11/03/2022   BUN 21 11/03/2022   CREATININE 0.44 11/03/2022   GFRNONAA >60 11/03/2022   CALCIUM 9.7 11/03/2022   PROT 8.9 (H) 10/22/2022   ALBUMIN 3.8 10/22/2022   LABGLOB 3.1 07/15/2021   AGRATIO 1.5 07/15/2021   BILITOT 0.6 10/22/2022   ALKPHOS 191 (H) 10/22/2022   AST 38 10/22/2022   ALT 26 10/22/2022   ANIONGAP 13 11/03/2022   Last lipids Lab Results  Component Value Date   CHOL 192 09/04/2022   HDL 49 09/04/2022   LDLCALC 111 (H) 09/04/2022   TRIG 181 (H) 09/04/2022   CHOLHDL 3.9 09/04/2022   Last hemoglobin A1c Lab Results  Component Value Date   HGBA1C 5.6 09/04/2022   Last vitamin D No results found for: "25OHVITD2", "25OHVITD3", "VD25OH"    The 10-year ASCVD risk score (Arnett DK, et al., 2019) is: 9.7%    Assessment & Plan:   Problem List Items Addressed This Visit       Cardiovascular and Mediastinum   Pulmonary hypertension (HCC)    Chronic  Established on cardiac cath Continue to monitor  Follow up with cardiology      Relevant Medications   Torsemide 40 MG TABS   RESOLVED: Pulmonary hypertension, primary (HCC)    Chronic       Relevant Medications   Torsemide 40 MG TABS     Respiratory   Acute respiratory failure with hypoxia and hypercarbia (HCC)    Improved since hospital dc  Currently on 2 L of O2 at baseline  Uses BiPAP for sleep  PT will work to wean O2 as tolerated      Relevant Orders   Comprehensive metabolic panel   RESOLVED: Acute and chronic respiratory failure with hypercapnia (HCC)     Digestive   Esophageal dysphagia - Primary    Chronic with unclear etiology despite  extensive inpatient workup  Follow up GI outpatient for further workup  Discussed with patient that GI will manage her G tube feedings        Nervous and Auditory   Injury of phrenic nerve    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 during previous surgery lead to restrictive lung disease  Currently on 2 L O2 at baseline, PT will work to decrease O2 as tolerated  Use BiPAP at night  Follow up with pulmonology outpatient      Relevant Medications   venlafaxine (EFFEXOR) 100 MG tablet     Other   Anxiety, generalized    Chronic Worsening due to recent hospitalization  Increase  Effexor to 100 mg BID        Relevant Medications   venlafaxine (EFFEXOR) 100 MG tablet   Moderate episode of recurrent major depressive disorder (HCC)    Chronic Worsening due to recent hospitalization  Increase Effexor to 100 mg BID      Relevant Medications   venlafaxine (EFFEXOR) 100 MG tablet   Anemia    Chronic  Worsened during recent hospitalization  Hbg on dc was 8.2  Recheck CBC       Relevant Orders   CBC w/Diff/Platelet   Failure to thrive in adult    Occurred over the course of the past year  Unclear etiology despite extensive workup       Tinnitus, right ear    Acute  Etiology is likely multifactorial Discussed referral to ENT, patient already established with ENT  Pt will call to schedule appointmen       Return in about 4 weeks (around 01/02/2023) for MDD/GAD f/u.    Rometta Emery, Medical Student   Patient seen along with MS3 student Jodi Marble. I personally evaluated this patient along with the student, and verified all aspects of the history, physical exam, and medical decision making as documented by the student. I agree with the student's documentation and have made all necessary edits.  Graeden Bitner, Marzella Schlein, MD, MPH [facility]

## 2022-12-05 NOTE — Assessment & Plan Note (Signed)
Acute  Etiology is likely multifactorial Discussed referral to ENT, patient already established with ENT  Pt will call to schedule appointmen

## 2022-12-05 NOTE — Assessment & Plan Note (Signed)
Chronic with unclear etiology despite extensive inpatient workup  Follow up GI outpatient for further workup  Discussed with patient that GI will manage her G tube feedings

## 2022-12-05 NOTE — Assessment & Plan Note (Signed)
Occurred over the course of the past year  Unclear etiology despite extensive workup

## 2022-12-05 NOTE — Assessment & Plan Note (Signed)
Acute  Etiology is likely multifactorial Discussed referral to ENT, patient already established with ENT  Pt will call to schedule appointment

## 2022-12-05 NOTE — Assessment & Plan Note (Signed)
Chronic. 

## 2022-12-06 ENCOUNTER — Other Ambulatory Visit: Payer: Medicaid Other

## 2022-12-06 ENCOUNTER — Telehealth: Payer: Self-pay

## 2022-12-06 ENCOUNTER — Encounter: Payer: Medicaid Other | Admitting: Cardiology

## 2022-12-06 LAB — CBC WITH DIFFERENTIAL/PLATELET
Basophils Absolute: 0 10*3/uL (ref 0.0–0.2)
Basos: 0 %
EOS (ABSOLUTE): 0.1 10*3/uL (ref 0.0–0.4)
Eos: 2 %
Hematocrit: 27.2 % — ABNORMAL LOW (ref 34.0–46.6)
Hemoglobin: 8 g/dL — ABNORMAL LOW (ref 11.1–15.9)
Immature Grans (Abs): 0 10*3/uL (ref 0.0–0.1)
Immature Granulocytes: 0 %
Lymphocytes Absolute: 1 10*3/uL (ref 0.7–3.1)
Lymphs: 19 %
MCH: 27.3 pg (ref 26.6–33.0)
MCHC: 29.4 g/dL — ABNORMAL LOW (ref 31.5–35.7)
MCV: 93 fL (ref 79–97)
Monocytes Absolute: 0.7 10*3/uL (ref 0.1–0.9)
Monocytes: 12 %
Neutrophils Absolute: 3.5 10*3/uL (ref 1.4–7.0)
Neutrophils: 67 %
Platelets: 228 10*3/uL (ref 150–450)
RBC: 2.93 x10E6/uL — ABNORMAL LOW (ref 3.77–5.28)
RDW: 14.3 % (ref 11.7–15.4)
WBC: 5.3 10*3/uL (ref 3.4–10.8)

## 2022-12-06 LAB — COMPREHENSIVE METABOLIC PANEL
ALT: 13 IU/L (ref 0–32)
AST: 24 IU/L (ref 0–40)
Albumin: 4 g/dL (ref 3.9–4.9)
Alkaline Phosphatase: 102 IU/L (ref 44–121)
BUN/Creatinine Ratio: 50 — ABNORMAL HIGH (ref 12–28)
BUN: 22 mg/dL (ref 8–27)
Bilirubin Total: 0.5 mg/dL (ref 0.0–1.2)
CO2: 42 mmol/L (ref 20–29)
Calcium: 9.7 mg/dL (ref 8.7–10.3)
Chloride: 89 mmol/L — ABNORMAL LOW (ref 96–106)
Creatinine, Ser: 0.44 mg/dL — ABNORMAL LOW (ref 0.57–1.00)
Globulin, Total: 3 g/dL (ref 1.5–4.5)
Glucose: 162 mg/dL — ABNORMAL HIGH (ref 70–99)
Potassium: 3.5 mmol/L (ref 3.5–5.2)
Sodium: 141 mmol/L (ref 134–144)
Total Protein: 7 g/dL (ref 6.0–8.5)
eGFR: 109 mL/min/{1.73_m2} (ref >59)

## 2022-12-06 NOTE — Patient Outreach (Signed)
Care Management  Transitions of Care Program Managed Medicaid Transitions of Care week 2   12/06/2022 Name: Julia Stark MRN: [id number] DOB: [birth year]  Subjective: Julia Stark is a 62 y.o. year old female who is a primary care patient of Bacigalupo, Marzella Schlein, MD. The Care Management team Engaged with patient Engaged with patient by telephone to assess and address transitions of care needs.   Consent to Services:  Patient was given information about care management services, agreed to services, and gave verbal consent to participate.   Assessment:           SDOH Interventions    Flowsheet Row Telephone from 12/06/2022 in [place] [facility] Office Visit from 12/05/2022 in [facility] Office Visit from 02/27/2022 in [facility] Office Visit from 08/02/2021 in [facility] Office Visit from 01/11/2021 in [facility] Office Visit from 11/26/2020 in [facility]  SDOH Interventions        Depression Interventions/Treatment  -- Currently on Treatment Currently on Treatment PHQ2-9 Score <4 Follow-up Not Indicated Currently on Treatment Currently on Treatment  Physical Activity Interventions Other (Comments)  [HHS PT 2Xs per week] -- -- -- -- --        Goals Addressed             This Visit's Progress    Transition of Care       Current Barriers:  Knowledge Deficits related to plan of care for management of diagnosed "Esophageal Dysmotility" with resultant Dysphagia and need for PEG tube placement for nutritional intake   RNCM Clinical Goal(s):  Patient will verbalize understanding of plan for management of diagnosed "Esophageal Dysmotility" resulting in dysphagia as evidenced by stated understanding for needed enteral feedings via PEG tube multiple times per day, need for aspiration precautions, need for Dietitian input and  monitoring of Total Protein and Albumin/Gobulin ratio (A/G) labs, need to weigh self daily to ensure no further loss of weight as well as to discern an unintentional gain of 2+ pounds over 24 hours most likely indicating an increase in unwanted fluid volume and need to call Cardiologist regarding possible exacerbation of heart failure/fluid overload not experience hospital admission as evidenced by review of EMR. Hospital Admissions in last 6 months = [facility], 11/03/22 thru 11/30/22  through collaboration with RN Care manager, provider, and care team.   Interventions: 1:1 collaboration with primary care provider regarding development and update of comprehensive plan of care as evidenced by provider attestation and co-signature Inter-disciplinary care team collaboration (see longitudinal plan of care) Evaluation of current treatment plan related to  self management and patient's adherence to plan as established by provider   Management of Dysphagia   (Status:  New goal.)  Short Term Goal Evaluation of current treatment plan related to  Esophageal Dysmotility and resultant Dysphagia requiring PEG tube feedings , Level of care concerns self-management and patient's adherence to plan as established by provider. Discussed plans with patient for ongoing care management follow up and provided patient with direct contact information for care management team Evaluation of current treatment plan related to diagnosed Esophageal Dysmotility and patient's adherence to plan as established by provider Reviewed scheduled/upcoming provider appointments including PCP, [facility], [facility] Cardiologist, and continued support from [facility] with RN, PT/OT/ST  Patient Goals/Self-Care Activities: Take all medications as prescribed Attend all scheduled provider appointments Call pharmacy for medication refills  3-7 days in advance of running out of medications Call provider office for new concerns or  questions  call office if I gain more than 2 pounds in one day or 5 pounds in one week meet with dietitian track weight in diary weigh myself daily bring diary to all appointments track symptoms and what helps feel better or worse  Follow Up Plan:  Telephone follow up appointment with care management team member scheduled for:  September 11, at 315pm          Plan: The patient has been provided with contact information for the care management team and has been advised to call with any health related questions or concerns.   [name], RN, BA, [facility], CRRN [facility] Coordinator, Transition of Care Ph # [phone]

## 2022-12-06 NOTE — Patient Instructions (Signed)
Visit Information  Dear Julia Stark,  Thank you for taking time to visit with me today. I feel you are taking control of your wellbeing as best you can. From what you told me today about your continued need for PEG tube feedings, I focused a care plan centered around your current Esophageal Dysmotility and need to gain back high protein/calorie-dense connected weight VERSUS unintentional/unwanted fluid volume weight that could be due to exacerbation of your heart failure. Please continue to weigh yourself daily as directed, once in the morning after voiding, and once again after your first PEG tube feeding and keep recording these measurements.    Please don't hesitate to contact me if I can be of assistance to you before our next scheduled telephone appointment.  Our next appointment is by telephone on September 11 at 3:15pm  Following is a copy of your care plan:   Goals Addressed             This Visit's Progress    Transition of Care       Current Barriers:  Knowledge Deficits related to plan of care for management of diagnosed "Esophageal Dysmotility" with resultant Dysphagia and need for PEG tube placement for nutritional intake   RNCM Clinical Goal(s):  Patient will verbalize understanding of plan for management of diagnosed "Esophageal Dysmotility" resulting in dysphagia as evidenced by stated understanding for needed enteral feedings via PEG tube multiple times per day, need for aspiration precautions, need for Dietitian input and monitoring of Total Protein and Albumin/Gobulin ratio (A/G) labs, need to weigh self daily to ensure no further loss of weight as well as to discern an unintentional gain of 2+ pounds over 24 hours most likely indicating an increase in unwanted fluid volume and need to call Cardiologist regarding possible exacerbation of heart failure/fluid overload not experience hospital admission as evidenced by review of EMR. Hospital Admissions in last 6 months = [facility],  11/03/22 thru 11/30/22  through collaboration with RN Care manager, provider, and care team.   Interventions: 1:1 collaboration with primary care provider regarding development and update of comprehensive plan of care as evidenced by provider attestation and co-signature Inter-disciplinary care team collaboration (see longitudinal plan of care) Evaluation of current treatment plan related to  self management and patient's adherence to plan as established by provider   Management of Dysphagia   (Status:  New goal.)  Short Term Goal Evaluation of current treatment plan related to  Esophageal Dysmotility and resultant Dysphagia requiring PEG tube feedings , Level of care concerns self-management and patient's adherence to plan as established by provider. Discussed plans with patient for ongoing care management follow up and provided patient with direct contact information for care management team Evaluation of current treatment plan related to diagnosed Esophageal Dysmotility and patient's adherence to plan as established by provider Reviewed scheduled/upcoming provider appointments including PCP, [facility],  Cardiologist, and continued support from [facility] with RN, PT/OT/ST  Patient Goals/Self-Care Activities: Take all medications as prescribed Attend all scheduled provider appointments Call pharmacy for medication refills 3-7 days in advance of running out of medications Call provider office for new concerns or questions  call office if I gain more than 2 pounds in one day or 5 pounds in one week meet with dietitian track weight in diary weigh myself daily bring diary to all appointments track symptoms and what helps feel better or worse  Follow Up Plan:  Telephone follow up appointment with care management team member  scheduled for:  September 11, at 315pm          Patient verbalizes understanding of instructions and care plan provided today and agrees to  view in MyChart. Active MyChart status and patient understanding of how to access instructions and care plan via MyChart confirmed with patient.     The patient has been provided with contact information for the care management team and has been advised to call with any health related questions or concerns.   Please call the care guide team at [phone] if you need to cancel or reschedule your appointment.   Please call 1-800-273-TALK (toll free, 24 hour hotline) if you are experiencing a Mental Health or Behavioral Health Crisis or need someone to talk to.  [name], RN, BA, [facility], CRRN [facility] Coordinator, Transition of Care Ph # [phone]

## 2022-12-07 ENCOUNTER — Telehealth: Payer: Self-pay

## 2022-12-07 ENCOUNTER — Inpatient Hospital Stay: Payer: Medicaid Other | Admitting: Family Medicine

## 2022-12-07 DIAGNOSIS — I5032 Chronic diastolic (congestive) heart failure: Secondary | ICD-10-CM

## 2022-12-07 MED ORDER — ACETAZOLAMIDE 250 MG PO TABS: 250.0000 mg | ORAL_TABLET | Freq: Two times a day (BID) | ORAL | 0 refills | Status: DC

## 2022-12-07 NOTE — Telephone Encounter (Signed)
Spoke with pt regarding [name] FNP recommendations: Acetazolamide 250 mg (1 tablet) 2 times a day for 2 weeks only.  BMP on 12/21/22. Pt aware, agreeable, and verbalized understanding .  [name] A, FNP  Carbon dioxide level is a little high so begin acetazolamide 250 mg BID for 2 weeks only. It can be dissolved in water and then put through your feeding tube. Check BMP in 2 weeks.

## 2022-12-08 ENCOUNTER — Encounter: Payer: Self-pay | Admitting: Family Medicine

## 2022-12-11 ENCOUNTER — Encounter: Payer: Self-pay | Admitting: Family

## 2022-12-11 NOTE — Addendum Note (Signed)
Addended by: [name] on: 12/11/2022 08:11 AM   Modules accepted: Level of Service

## 2022-12-13 ENCOUNTER — Telehealth: Payer: Self-pay

## 2022-12-13 ENCOUNTER — Ambulatory Visit: Payer: Medicaid Other | Admitting: Internal Medicine

## 2022-12-13 ENCOUNTER — Encounter: Payer: Self-pay | Admitting: Family Medicine

## 2022-12-13 ENCOUNTER — Other Ambulatory Visit: Payer: Medicaid Other

## 2022-12-13 NOTE — Patient Outreach (Signed)
  Care Management  Transitions of Care Program Managed Medicaid Transitions of Care week 2  12/13/2022 Name: Julia Stark MRN: [id number] DOB: Aug 01, 1960  Subjective: Julia Stark is a 62 y.o. year old female who is a primary care patient of Bacigalupo, Marzella Schlein, MD. The Care Management team was unable to reach the patient by phone to assess and address transitions of care needs.   Plan: Additional outreach attempts will be made to reach the patient enrolled in the [facility] Program (Post Inpatient/ED Visit).  [name], RN, BA, [facility], CRRN [facility] Coordinator, Transition of Care Ph # [phone]

## 2022-12-14 ENCOUNTER — Other Ambulatory Visit: Payer: Medicaid Other

## 2022-12-14 ENCOUNTER — Encounter: Payer: Self-pay | Admitting: Family Medicine

## 2022-12-14 ENCOUNTER — Telehealth: Payer: Self-pay

## 2022-12-14 DIAGNOSIS — K224 Dyskinesia of esophagus: Secondary | ICD-10-CM

## 2022-12-14 DIAGNOSIS — G5680 Other specified mononeuropathies of unspecified upper limb: Secondary | ICD-10-CM

## 2022-12-14 NOTE — Patient Outreach (Signed)
Care Management  Transitions of Care Program Managed Medicaid Transitions of Care week 3   12/14/2022 Name: Julia Stark MRN: [id number] DOB: [birth year]  Subjective: Julia Stark is a 62 y.o. year old female who is a primary care patient of Bacigalupo, Marzella Schlein, MD. The Care Management team Engaged with patient Engaged with patient by telephone to assess and address transitions of care needs.   Consent to Services:  Patient was given information about Managed Medicaid Care Management services, agreed to services, and gave verbal consent to participate.   Assessment:           SDOH Interventions    Flowsheet Row Telephone from 12/14/2022 in [place] [facility] Telephone from 12/06/2022 in [place] [facility] Office Visit from 12/05/2022 in [facility] Office Visit from 02/27/2022 in [facility] Office Visit from 08/02/2021 in [facility] Office Visit from 01/11/2021 in [facility]  SDOH Interventions        Food Insecurity Interventions Intervention Not Indicated -- -- -- -- --  Housing Interventions Intervention Not Indicated -- -- -- -- --  Transportation Interventions Intervention Not Indicated -- -- -- -- --  Utilities Interventions Intervention Not Indicated -- -- -- -- --  Depression Interventions/Treatment  --  [referred patient to LCSW for counseling/assistance with coping with life-altering chronic illness] -- Currently on Treatment Currently on Treatment PHQ2-9 Score <4 Follow-up Not Indicated Currently on Treatment  Physical Activity Interventions -- Other (Comments)  [HHS PT 2Xs per week] -- -- -- --  Health Literacy Interventions Intervention Not Indicated -- -- -- -- --        Goals Addressed             This Visit's Progress    Transition of Care       Current Barriers:  Knowledge Deficits related to plan of care  for management of diagnosed "Esophageal Dysmotility" with resultant Dysphagia and need for PEG tube placement for nutritional intake   RNCM Clinical Goal(s):  Patient will verbalize understanding of plan for management of diagnosed "Esophageal Dysmotility" resulting in dysphagia as evidenced by stated understanding for needed enteral feedings via PEG tube multiple times per day, need for aspiration precautions, need for Dietitian input and monitoring of Total Protein and Albumin/Gobulin ratio (A/G) labs, need to weigh self daily to ensure no further loss of weight as well as to discern an unintentional gain of 2+ pounds over 24 hours most likely indicating an increase in unwanted fluid volume and need to call Cardiologist regarding possible exacerbation of heart failure/fluid overload not experience hospital admission as evidenced by review of EMR. Hospital Admissions in last 6 months = [facility], 11/03/22 thru 11/30/22  through collaboration with RN Care manager, provider, and care team.   Interventions: 1:1 collaboration with primary care provider regarding development and update of comprehensive plan of care as evidenced by provider attestation and co-signature Inter-disciplinary care team collaboration (see longitudinal plan of care) Evaluation of current treatment plan related to  self management and patient's adherence to plan as established by provider   Management of Dysphagia   (Status:  New goal.)  Short Term Goal Evaluation of current treatment plan related to  Esophageal Dysmotility and resultant Dysphagia requiring PEG tube feedings , Level of care concerns self-management and patient's adherence to plan as established by provider. Discussed plans with patient for ongoing care management follow up and provided patient  with direct contact information for care management team Evaluation of current treatment plan related to diagnosed Esophageal Dysmotility and patient's adherence to plan as  established by provider Reviewed scheduled/upcoming provider appointments including PCP, [facility], [place] Cardiologist, and continued support from [facility] with RN, PT/OT/ST  Patient Goals/Self-Care Activities: Take all medications as prescribed Attend all scheduled provider appointments Call pharmacy for medication refills 3-7 days in advance of running out of medications Call provider office for new concerns or questions  call office if I gain more than 2 pounds in one day or 5 pounds in one week meet with dietitian track weight in diary weigh myself daily bring diary to all appointments track symptoms and what helps feel better or worse During 9/12 telephone visit, agreed to speak to LCSW Dickie La re coping with life-altering chronic health conditions. Referral made on 9/12.  Follow Up Plan:  Telephone follow up appointment with care management team member scheduled for:  September 18, at 315pm          Plan: The patient has been provided with contact information for the care management team and has been advised to call with any health related questions or concerns.   [name], RN, BA, [facility], CRRN [facility] Coordinator, Transition of Care Ph # [phone]

## 2022-12-14 NOTE — Patient Instructions (Signed)
Visit Information  Hi Julia Stark,  Thank you for taking time to visit with me today. I'm pleased you accepted my suggestion to talk to our LCSW, Julia Stark, by phone as it is challenging to cope with life-altering changes in our health and she may be able to suggest some coping strategies for you as well as simply be an empathetic, listening presence. You currently have a wonderful support system "team" around you, and I believe Julia Stark will be an additional valuable resource to you & your "team".  Please don't hesitate to contact me if I can be of assistance to you before our next scheduled telephone appointment.  Our next appointment is by telephone on September 18 at 3:15pm  Warm regards,  Julia Stark  Following is a copy of your care plan:   Goals Addressed             This Visit's Progress    Transition of Care       Current Barriers:  Knowledge Deficits related to plan of care for management of diagnosed "Esophageal Dysmotility" with resultant Dysphagia and need for PEG tube placement for nutritional intake   RNCM Clinical Goal(s):  Patient will verbalize understanding of plan for management of diagnosed "Esophageal Dysmotility" resulting in dysphagia as evidenced by stated understanding for needed enteral feedings via PEG tube multiple times per day, need for aspiration precautions, need for Dietitian input and monitoring of Total Protein and Albumin/Gobulin ratio (A/G) labs, need to weigh self daily to ensure no further loss of weight as well as to discern an unintentional gain of 2+ pounds over 24 hours most likely indicating an increase in unwanted fluid volume and need to call Cardiologist regarding possible exacerbation of heart failure/fluid overload not experience hospital admission as evidenced by review of EMR. Hospital Admissions in last 6 months = [facility], 11/03/22 thru 11/30/22  through collaboration with RN Care manager, provider, and care team.   Interventions: 1:1 collaboration  with primary care provider regarding development and update of comprehensive plan of care as evidenced by provider attestation and co-signature Inter-disciplinary care team collaboration (see longitudinal plan of care) Evaluation of current treatment plan related to  self management and patient's adherence to plan as established by provider   Management of Dysphagia   (Status:  New goal.)  Short Term Goal Evaluation of current treatment plan related to  Esophageal Dysmotility and resultant Dysphagia requiring PEG tube feedings , Level of care concerns self-management and patient's adherence to plan as established by provider. Discussed plans with patient for ongoing care management follow up and provided patient with direct contact information for care management team Evaluation of current treatment plan related to diagnosed Esophageal Dysmotility and patient's adherence to plan as established by provider Reviewed scheduled/upcoming provider appointments including PCP, [facility], [place] Cardiologist, and continued support from [facility] with RN, PT/OT/ST  Patient Goals/Self-Care Activities: Take all medications as prescribed Attend all scheduled provider appointments Call pharmacy for medication refills 3-7 days in advance of running out of medications Call provider office for new concerns or questions  call office if I gain more than 2 pounds in one day or 5 pounds in one week meet with dietitian track weight in diary weigh myself daily bring diary to all appointments track symptoms and what helps feel better or worse During 9/12 telephone visit, agreed to speak to LCSW Julia Stark re coping with life-altering chronic health conditions. Referral made on 9/12.  Follow Up Plan:  Telephone  follow up appointment with care management team member scheduled for:  September 18, at 315pm          Patient verbalizes understanding of instructions and care plan provided  today and agrees to view in MyChart. Active MyChart status and patient understanding of how to access instructions and care plan via MyChart confirmed with patient.     The patient has been provided with contact information for the care management team and has been advised to call with any health related questions or concerns.   Please call the care guide team at [phone] if you need to cancel or reschedule your appointment.   Please call 1-800-273-TALK (toll free, 24 hour hotline) if you are experiencing a Mental Health or Behavioral Health Crisis or need someone to talk to.  Julia Low, RN, BA, [facility], CRRN [facility] Coordinator, Transition of Care Ph # [phone]

## 2022-12-14 NOTE — Addendum Note (Signed)
Addended by: [name] A on: 12/14/2022 09:48 PM   Modules accepted: Orders

## 2022-12-15 ENCOUNTER — Other Ambulatory Visit: Payer: Self-pay

## 2022-12-15 ENCOUNTER — Other Ambulatory Visit: Payer: Self-pay | Admitting: Family Medicine

## 2022-12-15 ENCOUNTER — Encounter: Payer: Self-pay | Admitting: Family Medicine

## 2022-12-15 MED ORDER — METOCLOPRAMIDE HCL 5 MG/5ML PO SOLN
2.5000 mg | Freq: Two times a day (BID) | ORAL | 5 refills | Status: DC
  Filled 2022-12-15: qty 120, 24d supply, fill #0

## 2022-12-15 MED ORDER — LINACLOTIDE 72 MCG PO CAPS
72.0000 ug | ORAL_CAPSULE | Freq: Every day | ORAL | 5 refills | Status: DC | PRN
  Filled 2022-12-15: qty 30, 30d supply, fill #0

## 2022-12-15 MED ORDER — DOCUSATE SODIUM 50 MG/5ML PO LIQD
100.0000 mg | Freq: Two times a day (BID) | ORAL | 5 refills | Status: DC
  Filled 2022-12-15: qty 100, 5d supply, fill #0

## 2022-12-15 MED ORDER — OMEPRAZOLE 2 MG/ML ORAL SUSPENSION
20.0000 mg | Freq: Two times a day (BID) | ORAL | 5 refills | Status: DC
  Filled 2022-12-15: qty 900, 45d supply, fill #0

## 2022-12-15 NOTE — Telephone Encounter (Signed)
Rx filled 12/15/22

## 2022-12-18 ENCOUNTER — Encounter: Payer: Self-pay | Admitting: Family Medicine

## 2022-12-18 ENCOUNTER — Other Ambulatory Visit: Payer: Self-pay

## 2022-12-18 MED ORDER — METOCLOPRAMIDE HCL 5 MG/5ML PO SOLN: 2.5000 mg | Freq: Two times a day (BID) | ORAL | 5 refills | Status: DC

## 2022-12-18 MED ORDER — DOCUSATE SODIUM 50 MG/5ML PO LIQD: 100.0000 mg | Freq: Two times a day (BID) | ORAL | 5 refills | Status: DC

## 2022-12-18 MED ORDER — LINACLOTIDE 72 MCG PO CAPS: 72.0000 ug | ORAL_CAPSULE | Freq: Every day | ORAL | 5 refills | Status: DC | PRN

## 2022-12-18 MED ORDER — OMEPRAZOLE 2 MG/ML ORAL SUSPENSION: 20.0000 mg | Freq: Two times a day (BID) | ORAL | 5 refills | Status: DC

## 2022-12-18 NOTE — Telephone Encounter (Signed)
Pt called to f/u on her medications being sent to the wrong pharmacy. Adviced pt medication docusate (COLACE) 50 MG/5ML liquid, linaclotide (LINZESS) 72 MCG capsule , metoCLOPramide (REGLAN) 5 MG/5ML solution, omeprazole (KONVOMEP) 2 mg/mL SUSP oral suspension sent to [facility] [address] [place] - [address] .  Pt stated that's all she needed to know.  Please advise.

## 2022-12-18 NOTE — Telephone Encounter (Signed)
Medications sent to [facility] pharmacy.

## 2022-12-18 NOTE — Telephone Encounter (Signed)
Medication sent to the wrong pharmacy.  Pharmacy: [address].  Please review. Called [facility] outpatient pharmacy and canceled the previous prescriptions.

## 2022-12-20 ENCOUNTER — Other Ambulatory Visit: Payer: Medicaid Other | Admitting: Licensed Clinical Social Worker

## 2022-12-20 ENCOUNTER — Other Ambulatory Visit: Payer: Medicaid Other

## 2022-12-20 ENCOUNTER — Encounter: Payer: Self-pay | Admitting: Family

## 2022-12-20 NOTE — Patient Instructions (Signed)
Visit Information  Julia Stark was given information about Medicaid Managed Care team care coordination services as a part of their Healthy [employer] Medicaid benefit. Julia Stark verbally consented to engagement with the [facility] Managed Care team.   If you are experiencing a medical emergency, please call 911 or report to your local emergency department or urgent care.   If you have a non-emergency medical problem during routine business hours, please contact your provider's office and ask to speak with a nurse.   For questions related to your [facility] health plan, please call: [phone] or visit the homepage here: [email or web address]  If you would like to schedule transportation through your [facility] plan, please call the following number at least 2 days in advance of your appointment: [phone]  For information about your ride after you set it up, call Ride Assist at [phone]. Use this number to activate a Will Call pickup, or if your transportation is late for a scheduled pickup. Use this number, too, if you need to make a change or cancel a previously scheduled reservation.  If you need transportation services right away, call [phone]. The after-hours call center is staffed 24 hours to handle ride assistance and urgent reservation requests (including discharges) 365 days a year. Urgent trips include sick visits, hospital discharge requests and life-sustaining treatment.  Call the [facility] Line at [phone], at any time, 24 hours a day, 7 days a week. If you are in danger or need immediate medical attention call 911.  If you would like help to quit smoking, call 1-800-QUIT-NOW (318-398-5446) OR Espaol: 1-855-Djelo-Ya (4-742-595-6387) o para ms informacin haga clic aqu or Text READY to 564-332 to register via text  Following is a copy of your plan of care:  Care Plan : LCSW Plan of Care   Updates made by Gustavus Bryant, LCSW since 12/20/2022 12:00 AM     Problem: Coping Skills (General Plan of Care)      Goal: Coping Skills Enhanced   Start Date: 12/20/2022  Priority: High  Note:   Priority: High  Timeframe:  Short-Range Goal Priority:  High Start Date:   12/20/22               Expected End Date:  ongoing                     Follow Up Date--01/03/23 at 3 pm  - keep 90 percent of scheduled appointments -consider counseling or psychiatry -consider bumping up your self-care  -consider creating a stronger support network   Why is this important?             Combatting depression may take some time.            If you don't feel better right away, don't give up on your treatment plan.    Current barriers:   Chronic Mental Health needs related to stress and anxiety regarding managing her health and new normal. Patient requires Support, Education, Resources, Referrals, Advocacy, and Care Coordination, in order to meet Unmet Mental Health Needs To Find a Therapist.  Patient will implement clinical interventions discussed today to decrease symptoms of depression and increase knowledge and/or ability of: coping skills. Mental Health Concerns and Social Isolation Patient lacks knowledge of available community counseling agencies and resources.  Clinical Goal(s): verbalize understanding of plan for management of Anxiety, Depression, and Stress and demonstrate a reduction in symptoms. Patient will connect with a provider for ongoing  mental health treatment, increase coping skills, healthy habits, self-management skills, and stress reduction        Patient Goals/Self-Care Activities: Take medications as prescribed   Attend all scheduled provider appointments Call pharmacy for medication refills 3-7 days in advance of running out of medications Perform all self care activities independently  Perform IADL's (shopping, preparing meals, housekeeping, managing finances)  independently Call provider office for new concerns or questions Work with the social worker to address care coordination needs and will continue to work with the clinical team to address health care and disease management related needs call 1-800-273-TALK (toll free, 24 hour hotline) go to [facility] [address] [place] [phone]) if an urgent crisis arises  call 911 if experiencing a Mental Health or Behavioral Health Crisis  Utilize healthy coping skills and supportive resources discussed Contact PCP with any questions or concerns Keep 90 percent of counseling appointments Call your insurance provider for more information about your Enhanced Benefits  Check out counseling resources provided  Begin personal counseling with LCSW, to reduce and manage symptoms of Depression and Stress, until well-established with mental health provider Accept all calls from representative with [facility] in an effort to establish ongoing mental health counseling and supportive services. Incorporate into daily practice - relaxation techniques, deep breathing exercises, and mindfulness meditation strategies. Talk about feelings with friends, family members, spiritual advisor, etc. Contact LCSW directly [phone]), if you have questions, need assistance, or if additional social work needs are identified between now and our next scheduled telephone outreach call. Call 988 for mental health hotline/crisis line if needed (24/7 available) Try techniques to reduce symptoms of anxiety/negative thinking (deep breathing, distraction, positive self talk, etc)  - develop a personal safety plan - develop a plan to deal with triggers like holidays, anniversaries - exercise at least 2 to 3 times per week - have a plan for how to handle bad days - journal feelings and what helps to feel better or worse - spend time or talk with others at least 2 to 3 times per week -  watch for early signs of feeling worse - begin personal counseling - call and visit an old friend - check out volunteer opportunities - join a support group - laugh; watch a funny movie or comedian - learn and use visualization or guided imagery - perform a random act of kindness - practice relaxation or meditation daily - start or continue a personal journal - practice positive thinking and self-talk -continue with compliance of taking medication  -identify current effective and ineffective coping strategies.  -implement positive self-talk in care to increase self-esteem, confidence and feelings of control.  -consider alternative and complementary therapy approaches such as meditation, mindfulness or yoga.  Call your insurance provider to gain education on benefits if desired Call your primary care doctor if symptoms get worse  -journaling, prayer, worship services, meditation or pastoral counseling.  -increase participation in pleasurable group activities such as hobbies, singing, sports or volunteering).  -consider the use of meditative movement therapy such as tai chi, yoga or qigong.  -start a regular daily exercise program based on tolerance, ability and patient choice to support positive thinking and activity    Follow Up Plan:  The patient has been provided with contact information for the care management team and has been advised to call with any mental health or health related questions or concerns.  The care management team will reach out to the patient again over  the next 30 business  days.   If you are experiencing a Mental Health or Behavioral Health Crisis or need someone to talk to, please call the Suicide and Crisis Lifeline: 988    Patient Goals: Initial goal     24- Hour Availability:    [facility]  [address] [place], [place] [phone] Crisis [phone]   [facility]  [phone]  [place] [facility]  [phone]    [facility]  [phone] (after hours)   Therapeutic Alternative/Mobile Crisis   [phone]   [place] National Suicide Hotline  (660) 520-2471 Len Childs) [place] 016   Call 636-048-7181 for mental health emergencies   [facility]  [phone]);  [place] and [employer]  [phone]); [place], [place], [place], [place], [place], [place], [place]    [facility] Residents For 24/7 walk-up access to mental health services for [facility] children (4+), adolescents and adults, please visit the [facility] located at [address] in [place], [place].  *[facility] also provides comprehensive outpatient behavioral health services in a variety of locations around the [place].  Connect With [place] [address] [place], [place] [address] HelpLine: (859)806-4547 or 1-938-296-9615  Get Directions  Find Help 24/7 By Phone Call our 24-hour HelpLine at (503)618-7246 or (331)422-9501 for immediate assistance for mental health and substance abuse issues.  Walk-In Help [place]: [facility] (Ages 4 and Up) [place]: Emergency Dept., [facility] Additional Resources National Hopeline Network: 1-800-SUICIDE The National Suicide Prevention Lifeline: 1-800-273-TALK     The following coping skill education was provided for stress relief and mental health management: "When your car dies or a deadline looms, how do you respond? Long-term, low-grade or acute stress takes a serious toll on your body and mind, so don't ignore feelings of constant tension. Stress is a natural part of life. However, too much stress can harm our health, especially if it continues every day. This is chronic stress and can put you at risk for heart problems like heart disease and  depression. Understand what's happening inside your body and learn simple coping skills to combat the negative impacts of everyday stressors.  Types of Stress There are two types of stress: Emotional - types of emotional stress are relationship problems, pressure at work, financial worries, experiencing discrimination or having a major life change. Physical - Examples of physical stress include being sick having pain, not sleeping well, recovery from an injury or having an alcohol and drug use disorder. Fight or Flight Sudden or ongoing stress activates your nervous system and floods your bloodstream with adrenaline and cortisol, two hormones that raise blood pressure, increase heart rate and spike blood sugar. These changes pitch your body into a fight or flight response. That enabled our ancestors to outrun saber-toothed tigers, and it's helpful today for situations like dodging a car accident. But most modern chronic stressors, such as finances or a challenging relationship, keep your body in that heightened state, which hurts your health. Effects of Too Much Stress If constantly under stress, most of [place] will eventually start to function less well.  Multiple studies link chronic stress to a higher risk of heart disease, stroke, depression, weight gain, memory loss and even premature death, so it's important to recognize the warning signals. Talk to your doctor about ways to manage stress if you're experiencing  any of these symptoms: Prolonged periods of poor sleep. Regular, severe headaches. Unexplained weight loss or gain. Feelings of isolation, withdrawal or worthlessness. Constant anger and irritability. Loss of interest in activities. Constant worrying or obsessive thinking. Excessive alcohol or drug use. Inability to concentrate.  10 Ways to Cope with Chronic Stress It's key to recognize stressful situations as they occur because it allows you to focus on managing how you react. We all  need to know when to close our eyes and take a deep breath when we feel tension rising. Use these tips to prevent or reduce chronic stress. 1. Rebalance Work and Home All work and no play? If you're spending too much time at the office, intentionally put more dates in your calendar to enjoy time for fun, either alone or with others. 2. Get Regular Exercise Moving your body on a regular basis balances the nervous system and increases blood circulation, helping to flush out stress hormones. Even a daily 20-minute walk makes a difference. Any kind of exercise can lower stress and improve your mood ? just pick activities that you enjoy and make it a regular habit. 3. Eat Well and Limit Alcohol and Stimulants Alcohol, nicotine and caffeine may temporarily relieve stress but have negative health impacts and can make stress worse in the long run. Well-nourished bodies cope better, so start with a good breakfast, add more organic fruits and vegetables for a well-balanced diet, avoid processed foods and sugar, try herbal tea and drink more water. 4. Connect with Supportive People Talking face to face with another person releases hormones that reduce stress. Lean on those good listeners in your life. 5. Carve Out Hobby Time Do you enjoy gardening, reading, listening to music or some other creative pursuit? Engage in activities that bring you pleasure and joy; research shows that reduces stress by almost half and lowers your heart rate, too. 6. Practice Meditation, Stress Reduction or Yoga Relaxation techniques activate a state of restfulness that counterbalances your body's fight-or-flight hormones. Even if this also means a 10-minute break in a long day: listen to music, read, go for a walk in nature, do a hobby, take a bath or spend time with a friend. Also consider doing a mindfulness exercise or try a daily deep breathing or imagery practice. Deep Breathing Slow, calm and deep breathing can help you relax.  Try these steps to focus on your breathing and repeat as needed. Find a comfortable position and close your eyes. Exhale and drop your shoulders. Breathe in through your nose; fill your lungs and then your belly. Think of relaxing your body, quieting your mind and becoming calm and peaceful. Breathe out slowly through your nose, relaxing your belly. Think of releasing tension, pain, worries or distress. Repeat steps three and four until you feel relaxed. Imagery This involves using your mind to excite the senses -- sound, vision, smell, taste and feeling. This may help ease your stress. Begin by getting comfortable and then do some slow breathing. Imagine a place you love being at. It could be somewhere from your childhood, somewhere you vacationed or just a place in your imagination. Feel how it is to be in the place you're imagining. Pay attention to the sounds, air, colors, and who is there with you. This is a place where you feel cared for and loved. All is well. You are safe. Take in all the smells, sounds, tastes and feelings. As you do, feel your body being nourished and healed. Feel the  calm that surrounds you. Breathe in all the good. Breathe out any discomfort or tension. 7. Sleep Enough If you get less than seven to eight hours of sleep, your body won't tolerate stress as well as it could. If stress keeps you up at night, address the cause, and add extra meditation into your day to make up for the lost z's. Try to get seven to nine hours of sleep each night. Make a regular bedtime schedule. Keep your room dark and cool. Try to avoid computers, TV, cell phones and tablets before bed. 8. Bond with Connections You Enjoy Go out for a coffee with a friend, chat with a neighbor, call a family member, visit with a clergy member, or even hang out with your pet. Clinical studies show that spending even a short time with a companion animal can cut anxiety levels almost in half. 9. Take a  Vacation Getting away from it all can reset your stress tolerance by increasing your mental and emotional outlook, which makes you a happier, more productive person upon return. Leave your cellphone and laptop at home! 10. See a Counselor, Coach or Therapist If negative thoughts overwhelm your ability to make positive changes, it's time to seek professional help. Make an appointment today--your health and life are worth it."  Dickie La, BSW, MSW, [employer] Managed Medicaid LCSW [facility]  [facility] [email or web address] Phone: [phone]

## 2022-12-20 NOTE — Patient Outreach (Signed)
Medicaid Managed Care Social Work Note  12/20/2022 Name:  Julia Stark MRN:  [id number] DOB:  [birth year]  Julia Stark is an 62 y.o. year old female who is a primary patient of Bacigalupo, Marzella Schlein, MD.  The Medicaid Managed Care Coordination team was consulted for assistance with:  Mental Health Counseling and Resources  Julia Stark was given information about Medicaid Managed Care Coordination team services today. Julia Stark Patient agreed to services and verbal consent obtained.  Engaged with patient  for by telephone forinitial visit in response to referral for case management and/or care coordination services.   Assessments/Interventions:  Review of past medical history, allergies, medications, health status, including review of consultants reports, laboratory and other test data, was performed as part of comprehensive evaluation and provision of chronic care management services.  SDOH: (Social Determinant of Health) assessments and interventions performed: SDOH Interventions    Flowsheet Row Telephone from 12/14/2022 in [place] [facility] Telephone from 12/06/2022 in [place] [facility] Office Visit from 12/05/2022 in [facility] Office Visit from 02/27/2022 in [facility] Office Visit from 08/02/2021 in [facility] Office Visit from 01/11/2021 in [facility]  SDOH Interventions        Food Insecurity Interventions Intervention Not Indicated -- -- -- -- --  Housing Interventions Intervention Not Indicated -- -- -- -- --  Transportation Interventions Intervention Not Indicated -- -- -- -- --  Utilities Interventions Intervention Not Indicated -- -- -- -- --  Depression Interventions/Treatment  --  [referred patient to LCSW for counseling/assistance with coping with life-altering chronic illness] -- Currently on Treatment Currently on  Treatment PHQ2-9 Score <4 Follow-up Not Indicated Currently on Treatment  Physical Activity Interventions -- Other (Comments)  [HHS PT 2Xs per week] -- -- -- --  Health Literacy Interventions Intervention Not Indicated -- -- -- -- --       Advanced Directives Status:  See Care Plan for related entries.  Care Plan                 Allergies  Allergen Reactions   Amiodarone Other (See Comments)    Other Reaction(s): Other (See Comments), Vomiting  Blurred vision   Flecainide Nausea And Vomiting    Hallucination   Metoprolol Shortness Of Breath     Muscle Pain     Trazodone Other (See Comments)    headaches    Medications Reviewed Today   Medications were not reviewed in this encounter     Patient Active Problem List   Diagnosis Date Noted   Esophageal dysphagia 12/05/2022   Acute respiratory failure with hypoxia and hypercarbia (HCC) 12/05/2022   Injury of phrenic nerve 12/05/2022   Failure to thrive in adult 12/05/2022   Tinnitus, right ear 12/05/2022   Pulmonary hypertension (HCC) 12/05/2022   Anemia 02/27/2022   Acquired thrombophilia (HCC) 07/14/2021   Status post radiofrequency ablation for arrhythmia 01/09/2021   On dronedarone therapy 11/16/2020   Renal stones 11/16/2020   Salivary gland calculi 11/16/2020   Ureteral stone 05/15/2020   Moderate episode of recurrent major depressive disorder (HCC) 12/29/2019   Overweight 12/04/2019   Basal cell carcinoma of face 10/03/2019   Typical atrial flutter (HCC) 08/15/2019   Lipoma of left upper extremity 08/04/2019   Primary insomnia 06/23/2019   Red blood cell antibody positive 05/22/2019   Chronic anticoagulation 06/19/2018   Affective disorder, major 08/22/2017  Allergic rhinitis 08/22/2017   Anxiety, generalized 08/22/2017   T2DM (type 2 diabetes mellitus) (HCC) 08/22/2017   Hypertension associated with diabetes (HCC) 08/22/2017   Hyperlipidemia associated with type 2 diabetes mellitus (HCC) 08/22/2017    Mobitz type 2 second degree atrioventricular block 02/05/2017   Paroxysmal A-fib (HCC) 05/25/2015   Recurrent genital HSV (herpes simplex virus) infection 03/13/2014   Chronic constipation 02/17/2014   Reflux gastritis 02/17/2014   Adenomatous polyp of descending colon 02/12/2012    Conditions to be addressed/monitored per PCP order:  Anxiety  Care Plan : LCSW Plan of Care  Updates made by Julia Bryant, LCSW since 12/20/2022 12:00 AM     Problem: Coping Skills (General Plan of Care)      Goal: Coping Skills Enhanced   Start Date: 12/20/2022  Priority: High  Note:   Priority: High  Timeframe:  Short-Range Goal Priority:  High Start Date:   12/20/22               Expected End Date:  ongoing                     Follow Up Date--01/03/23 at 3 pm  - keep 90 percent of scheduled appointments -consider counseling or psychiatry -consider bumping up your self-care  -consider creating a stronger support network   Why is this important?             Combatting depression may take some time.            If you don't feel better right away, don't give up on your treatment plan.    Current barriers:   Chronic Mental Health needs related to stress and anxiety regarding managing her health and new normal. Patient requires Support, Education, Resources, Referrals, Advocacy, and Care Coordination, in order to meet Unmet Mental Health Needs To Find a Therapist.  Patient will implement clinical interventions discussed today to decrease symptoms of depression and increase knowledge and/or ability of: coping skills. Mental Health Concerns and Social Isolation Patient lacks knowledge of available community counseling agencies and resources.  Clinical Goal(s): verbalize understanding of plan for management of Anxiety, Depression, and Stress and demonstrate a reduction in symptoms. Patient will connect with a provider for ongoing mental health treatment, increase coping skills, healthy habits,  self-management skills, and stress reduction        Clinical Interventions:  Assessed patient's previous and current treatment, coping skills, support system and barriers to care. Patient provided hx. Patient and spouse live together. Patient reports that her spouse is very supportive. Verbalization of feelings encouraged, motivational interviewing employed Emotional support provided, positive coping strategies explored. Establishing healthy boundaries emphasized and healthy self-care education provided Patient was educated on available mental health resources within their area that accept Medicaid and offer counseling and psychiatry. Patient was advised to contact the back of her insurance card for assistance with benefits as well. Patient educated on the difference between therapy and psychiatry per patient request Email sent to patient today with available mental health resources within her area that accept Medicaid and offer the services that she is interested in.  Patient prefers talk therapy ONLY at this time and is agreeable to referral to [facility] for in person therapy with a female counselor as this location is right near her residence.  Emotional support provided. CBT intervention implemented regarding "being mentally fit" by combating negative thinking and replacing it with uplifting support, hope and positivity. Patient reports significant worsening anxiety  impacting her ability to function appropriately and carry out daily task. Assessed social determinant of health barriers LCSW provided education on relaxation techniques such as meditation, deep breathing, massage, grounding exercises or yoga that can activate the body's relaxation response and ease symptoms of stress and anxiety. LCSW ask that when pt is struggling with difficult emotions and racing thoughts that they start this relaxation response process. LCSW provided extensive education on healthy coping skills for anxiety.  SW used active and reflective listening, validated patient's feelings/concerns, and provided emotional support. Patient will work on implementing appropriate self-care habits into their daily routine such as: staying positive, writing a gratitude list, drinking water, staying active around the house, taking their medications as prescribed, combating negative thoughts or emotions and staying connected with their family and friends. Positive reinforcement provided for this decision to work on this.  Motivational Interviewing employed Depression screen reviewed  PHQ2/ PHQ9 completed or reviewed  Mindfulness or Relaxation training provided Active listening / Reflection utilized  Advance Care and HCPOA education provided Emotional Support Provided Problem Solving /Task Center strategies reviewed Provided psychoeducation for mental health needs  Provided brief CBT  Reviewed mental health medications and discussed importance of compliance:  Quality of sleep assessed & Sleep Hygiene techniques promoted  Participation in counseling encouraged  Verbalization of feelings encouraged  Suicidal Ideation/Homicidal Ideation assessed: Patient denies SI/HI  Review resources, discussed options and provided patient information about  Mental Health Resources Inter-disciplinary care team collaboration (see longitudinal plan of care)  Patient Goals/Self-Care Activities: Take medications as prescribed   Attend all scheduled provider appointments Call pharmacy for medication refills 3-7 days in advance of running out of medications Perform all self care activities independently  Perform IADL's (shopping, preparing meals, housekeeping, managing finances) independently Call provider office for new concerns or questions Work with the social worker to address care coordination needs and will continue to work with the clinical team to address health care and disease management related needs call 1-800-273-TALK (toll  free, 24 hour hotline) go to [facility] [address], [place] [phone]) if an urgent crisis arises  call 911 if experiencing a Mental Health or Behavioral Health Crisis  Utilize healthy coping skills and supportive resources discussed Contact PCP with any questions or concerns Keep 90 percent of counseling appointments Call your insurance provider for more information about your Enhanced Benefits  Check out counseling resources provided  Begin personal counseling with LCSW, to reduce and manage symptoms of Depression and Stress, until well-established with mental health provider Accept all calls from representative with [facility] in an effort to establish ongoing mental health counseling and supportive services. Incorporate into daily practice - relaxation techniques, deep breathing exercises, and mindfulness meditation strategies. Talk about feelings with friends, family members, spiritual advisor, etc. Contact LCSW directly [phone]), if you have questions, need assistance, or if additional social work needs are identified between now and our next scheduled telephone outreach call. Call 988 for mental health hotline/crisis line if needed (24/7 available) Try techniques to reduce symptoms of anxiety/negative thinking (deep breathing, distraction, positive self talk, etc)  - develop a personal safety plan - develop a plan to deal with triggers like holidays, anniversaries - exercise at least 2 to 3 times per week - have a plan for how to handle bad days - journal feelings and what helps to feel better or worse - spend time or talk with others at least 2 to 3 times per week - watch for  early signs of feeling worse - begin personal counseling - call and visit an old friend - check out volunteer opportunities - join a support group - laugh; watch a funny movie or comedian - learn and use visualization or guided imagery - perform a  random act of kindness - practice relaxation or meditation daily - start or continue a personal journal - practice positive thinking and self-talk -continue with compliance of taking medication  -identify current effective and ineffective coping strategies.  -implement positive self-talk in care to increase self-esteem, confidence and feelings of control.  -consider alternative and complementary therapy approaches such as meditation, mindfulness or yoga.  Call your insurance provider to gain education on benefits if desired Call your primary care doctor if symptoms get worse  -journaling, prayer, worship services, meditation or pastoral counseling.  -increase participation in pleasurable group activities such as hobbies, singing, sports or volunteering).  -consider the use of meditative movement therapy such as tai chi, yoga or qigong.  -start a regular daily exercise program based on tolerance, ability and patient choice to support positive thinking and activity    Follow Up Plan:  The patient has been provided with contact information for the care management team and has been advised to call with any mental health or health related questions or concerns.  The care management team will reach out to the patient again over the next 30 business  days.   If you are experiencing a Mental Health or Behavioral Health Crisis or need someone to talk to, please call the Suicide and Crisis Lifeline: 988    Patient Goals: Initial goal      Follow up:  Patient agrees to Care Plan and Follow-up.  Plan: The Managed Medicaid care management team will reach out to the patient again over the next 30 days.  Dickie La, BSW, MSW, [employer] Managed Medicaid LCSW [facility]  [facility] [email or web address] Phone: [phone]

## 2022-12-21 ENCOUNTER — Encounter: Payer: Self-pay | Admitting: Family

## 2022-12-21 ENCOUNTER — Other Ambulatory Visit
Admission: RE | Admit: 2022-12-21 | Discharge: 2022-12-21 | Disposition: A | Discharge status: Home / Self Care | Payer: Medicaid Other | Source: Ambulatory Visit | Attending: Family | Admitting: Family

## 2022-12-21 ENCOUNTER — Ambulatory Visit (HOSPITAL_BASED_OUTPATIENT_CLINIC_OR_DEPARTMENT_OTHER): Payer: Medicaid Other | Admitting: Family

## 2022-12-21 ENCOUNTER — Other Ambulatory Visit (HOSPITAL_COMMUNITY): Payer: Self-pay

## 2022-12-21 VITALS — BP 116/42 | HR 70 | Wt 142.0 lb

## 2022-12-21 DIAGNOSIS — I480 Paroxysmal atrial fibrillation: Secondary | ICD-10-CM

## 2022-12-21 DIAGNOSIS — I272 Pulmonary hypertension, unspecified: Secondary | ICD-10-CM

## 2022-12-21 DIAGNOSIS — I10 Essential (primary) hypertension: Secondary | ICD-10-CM | POA: Diagnosis not present

## 2022-12-21 DIAGNOSIS — I5032 Chronic diastolic (congestive) heart failure: Secondary | ICD-10-CM | POA: Insufficient documentation

## 2022-12-21 DIAGNOSIS — R1319 Other dysphagia: Secondary | ICD-10-CM

## 2022-12-21 DIAGNOSIS — J90 Pleural effusion, not elsewhere classified: Secondary | ICD-10-CM | POA: Diagnosis not present

## 2022-12-21 LAB — BASIC METABOLIC PANEL
Anion gap: 10 (ref 5–15)
BUN: 20 mg/dL (ref 8–23)
CO2: 41 mmol/L — ABNORMAL HIGH (ref 22–32)
Calcium: 8.9 mg/dL (ref 8.9–10.3)
Chloride: 85 mmol/L — ABNORMAL LOW (ref 98–111)
Creatinine, Ser: 0.39 mg/dL — ABNORMAL LOW (ref 0.44–1.00)
GFR, Estimated: >60 mL/min (ref >60)
Glucose, Bld: 153 mg/dL — ABNORMAL HIGH (ref 70–99)
Potassium: 3.6 mmol/L (ref 3.5–5.1)
Sodium: 136 mmol/L (ref 135–145)

## 2022-12-21 LAB — BRAIN NATRIURETIC PEPTIDE: B Natriuretic Peptide: 201 pg/mL — ABNORMAL HIGH (ref 0.0–100.0)

## 2022-12-21 MED ORDER — TORSEMIDE 40 MG PO TABS: 40.0000 mg | ORAL_TABLET | Freq: Every day | ORAL | 5 refills | Status: DC

## 2022-12-21 MED ORDER — FUROSCIX 80 MG/10ML ~~LOC~~ CTKT: 1.0000 | CARTRIDGE | SUBCUTANEOUS | Status: DC

## 2022-12-21 NOTE — Patient Instructions (Addendum)
Go over to the [facility]. Go pass the gift shop and have your blood work completed.  Take your Furoscix off after 5 hours of application. You will apply Furoscix again on Saturday if needed. Take off after 5 hours of application. Do NOT take torsemide on the day you use the furoscix  We will call you with further instructions after we receive your lab results.  Follow up with [place] in 1 wk.

## 2022-12-21 NOTE — Progress Notes (Signed)
PCP: [name], MD (last seen 09/24) Primary Cardiologist: Dorothyann Peng, MD (last seen 01/24) HF MD: Marca Ancona, MD (last seen 08/24)  HPI:  Ms Julia Stark is a 62 y.o. female with history of permanent atrial fibrillation, diastolic CHF, and diabetes.Patient has a long history of atrial fibrillation.  She has failed multiple anti-arrhythmic agents (flecainide, amiodarone, dronedarone, and propafenone).  She had an initial ablation in 5/21, then a redo ablation in 8/22.  At the time of 08/22 ablation, foci of atrial tachycardia were noted that were not ablated due to proximity to the AV node. 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  She had ongoing atrial arrhythmia episodes and underwent convergent endocardial/epicardial ablation at [facility] in 9/23 with left atrial appendage clipping.  This was complicated by ileus and then left hemothorax. She developed a recurrent left pleural effusion and ended up with left VATS in 10/23.  Atrial fibrillation recurred, and ultimately, she had AV nodal ablation with [facility] [employer] DDD pacemaker with left bundle lead placed.    Since her surgery, she has had chronic pain radiating from left lower chest to upper back. Primarily located at the site where she had a chest tube at time of hemothorax.  Pain is pleuritic. She was started on colchicine which initially helped but eventually stopped helping and she has stopped the medication.    She has been chronically short of breath since her surgery in 09/23.  Hemodynamic RHC/LHC in 4/24 showed no evidence for ventricular interdependence.  This was not suggestive of constrictive pericarditis.  Echo 04/24 showed EF 50-55%, moderate RV dysfunction, moderate RV enlargement, PASP 46 mmHg, mild-moderate TR; no evidence for constrictive pericarditis.   She has had dysphagia and has been diagnosed by GI with esophageal dysmotility. She tried a medication for esophageal dysmotility but it  caused GI upset so she stopped it.   Admitted 11/04/22 due to 4 to 6 months of progressively worsening dysphagia and weight loss. She feels like food gets stuck in her upper chest/lower neck, and it takes sometimes hours for it to feel like it fully makes its way to her stomach. This sensation takes place about a minute after she swallows. She does not have any coughing with food or drink. G-tube placement on 8/14. VBG was 7.23/99 and improved with adherence to BIPAP. Neurology was consulted for concern for NMJ pathology. EMG with repetitive nerve stimulation was essentially normal, making NMJ pathology unlikely. Diaphragm fluoroscopy on 8/13 demonstrating abnormally decreased diaphragmatic excursion, left worse than right without paradoxical motion of sniff test. Neg acetylcholine receptor antibody. Arranged for her to have ASTRAL VENT with f/u with Dr Danton Sewer in Vent clinic on 12/29/22. EGD with FLIP on 8/5 demonstrating suspicion for abnormal contractility, recommendation was for formal esophageal manometry at outpatient follow-up. She has follow up with GI on 12/28/22. Videofluoroscopy on 8/7 demonstrated esophageal retention, aspiration, and oropharyngeal aspiration with pharyngeal muscle weakness, accordingly with unsafe swallow initially. G-tube was placed on 8/14. Tolerating tube feeds.she will be discharged on TF 140 ml nutren 2 : 6 feeds/day,each feed over 1 hour through pump and 30 ml flushes. MRI /MRA brain done to evaluate dysphagia but showed stenosis and possible small dural AVF.It showed chronic small vessel disease. CTA neck and brain done to follow on this could not confirm or rule it out dural AVF but did not show stenosis. Neurosurgery consulted and did not recommend MRV and they felt risk of MRV since it will be done under  GA( patient is claustrophobic and hypercarbic) outweigh the benefit. This all resulted in a lengthy admission of 26 days.  Echo 07/14/22: Near-normal LVEF.  However, there are  signs of RV failure.  This does not look like definite constrictive pericarditis by echo. Suspect primarily RV failure.  Plan for cath in future.   RHC/LHC 07/28/22:  1. Normal filling pressures.  2. Mild to moderate pulmonary arterial hypertension (PVR not markedly high, only 3.5 WU).  3. Preserved cardiac output.  4. No interventricular interdependence with simultaneous RV/LV pressure tracings, no evidence for constrictive pericarditis.   She presents today for an acute HF follow-up visit with a chief complaint of worsening SOB with exertion over the last week. Has associated fatigue, pedal edema (worse over the last week), palpitations, intermittent dizziness & gradual weight gain along with this. Says that was able to reduce her oxygen down to 1L but with the worsening SOB, she had to bump it back up to 2L. She just realized today though that her current oxygen tubing had holes in it so we replaced the nasal cannula while she was here. She was able to walk up to the office today. Denies chest pain, cough or abdominal distention.   Could not tolerate acetazolamide due to GI side effects along with confusion so she stopped taking it after 4 days and symptoms resolved. Have added this medication to her allergy list today.   Has upcoming GI appt next Thurs and pulm next Friday at [facility]  Currently getting PT, OT and home health nursing.   ROS: All systems negative except as listed in HPI, PMH and Problem List.  SH:  Social History   Socioeconomic History   Marital status: Divorced    Spouse name: Not on file   Number of children: 3   Years of education: 12   Highest education level: Some college, no degree  Occupational History    Employer: [employer]    Comment: in special chemistry  Tobacco Use   Smoking status: Former    Types: Cigarettes   Smokeless tobacco: Never  Vaping Use   Vaping status: Never Used  Substance and Sexual Activity   Alcohol use: Not Currently    Alcohol/week:  4.0 standard drinks of alcohol    Types: 2 Glasses of wine, 2 Cans of beer per week   Drug use: No   Sexual activity: Yes    Partners: Male    Birth control/protection: Post-menopausal, Surgical  Other Topics Concern   Not on file  Social History Narrative   Not on file   Social Determinants of Health   Financial Resource Strain: Low Risk  (12/01/2022)   Received from [facility]   Overall Financial Resource Strain (CARDIA)    Difficulty of Paying Living Expenses: Not very hard  Recent Concern: Financial Resource Strain - Medium Risk (12/01/2022)   Overall Financial Resource Strain (CARDIA)    Difficulty of Paying Living Expenses: Somewhat hard  Food Insecurity: No Food Insecurity (12/14/2022)   Hunger Vital Sign    Worried About Running Out of Food in the Last Year: Never true    Ran Out of Food in the Last Year: Never true  Recent Concern: Food Insecurity - Food Insecurity Present (10/02/2022)   Hunger Vital Sign    Worried About Running Out of Food in the Last Year: Sometimes true    Ran Out of Food in the Last Year: Never true  Transportation Needs: No Transportation Needs (12/14/2022)  PRAPARE - Administrator, Civil Service (Medical): No    Lack of Transportation (Non-Medical): No  Physical Activity: Inactive (12/06/2022)   Exercise Vital Sign    Days of Exercise per Week: 0 days    Minutes of Exercise per Session: 0 min  Stress: Stress Concern Present (10/02/2022)   [employer] of Occupational Health - Occupational Stress Questionnaire    Feeling of Stress : To some extent  Social Connections: Socially Isolated (10/02/2022)   Social Connection and Isolation Panel [NHANES]    Frequency of Communication with Friends and Family: More than three times a week    Frequency of Social Gatherings with Friends and Family: Once a week    Attends Religious Services: Never    Database administrator or Organizations: No    Attends Hospital doctor: Not on file    Marital Status: Divorced  Intimate Partner Violence: Not At Risk (12/14/2022)   Humiliation, Afraid, Rape, and Kick questionnaire    Fear of Current or Ex-Partner: No    Emotionally Abused: No    Physically Abused: No    Sexually Abused: No    FH:  Family History  Problem Relation Age of Onset   Diabetes Mother    Hypertension Mother    Skin cancer Mother    Depression Mother    Alzheimer's disease Father    Diabetes Sister    Depression Sister    Diabetes Brother    Colon cancer Maternal Uncle    Hypertension Maternal Grandfather    Hyperlipidemia Maternal Grandfather    Diabetes Paternal Grandmother    Heart disease Paternal Grandmother    Prostate cancer Paternal Grandfather    Ovarian cancer Cousin    Breast cancer Neg Hx    Cervical cancer Neg Hx     Past Medical History:  Diagnosis Date   Actinic keratosis 07/19/2022   L chin - bx proven, needs LN2   Anxiety    Arthritis    bilateral shoulders   Atrial fibrillation (HCC)    Basal cell carcinoma    forehead   Cancer (HCC)    BASAL CELL CARCINOMA OF FACE   CHF (congestive heart failure) (HCC)    Colon polyp    COVID-19 11/26/2020   Depression    Diabetes mellitus without complication (HCC)    Dysrhythmia    A fib   Esophageal dysmotility    Hyperlipidemia    Hypertension    Internal hemorrhoids    Sialoadenitis 12/24/2018    Current Outpatient Medications  Medication Sig Dispense Refill   acetaminophen (TYLENOL) 160 MG/5ML liquid Take 500 mg by mouth every 4 (four) hours as needed for fever or pain.     acetaZOLAMIDE (DIAMOX) 250 MG tablet Take 1 tablet (250 mg total) by mouth 2 (two) times daily for 14 days. For 2 weeks only. 28 tablet 0   apixaban (ELIQUIS) 5 MG TABS tablet Take 1 tablet (5 mg total) by mouth 2 (two) times daily. 60 tablet 6   docusate (COLACE) 50 MG/5ML liquid Take 10 mLs (100 mg total) by mouth 2 (two) times daily for 24 days. 100 mL 5   lidocaine 4 %  Place 2 patches onto the skin daily. ON left side     linaclotide (LINZESS) 72 MCG capsule Take 1 capsule (72 mcg total) by mouth daily as needed. 30 capsule 5   melatonin 5 MG TABS Take 5 mg by mouth at bedtime.  metoCLOPramide (REGLAN) 5 MG/5ML solution Place 2.5 mLs (2.5 mg total) into feeding tube 2 (two) times daily. 120 mL 5   Multiple Vitamins-Minerals (TROPICAL LIQUID NUTRITION) LIQD Take 15 mLs by G tube once daily     omeprazole (KONVOMEP) 2 mg/mL SUSP oral suspension Take 10 mLs (20 mg total) by mouth 2 (two) times daily. 900 mL 5   rosuvastatin (CRESTOR) 5 MG tablet Take 1 tablet (5 mg total) by mouth every other day. 45 tablet 1   spironolactone (ALDACTONE) 25 MG tablet Take 1 tablet (25 mg total) by mouth daily. 90 tablet 3   Torsemide 40 MG TABS Take by mouth.     venlafaxine (EFFEXOR) 100 MG tablet Take 1 tablet (100 mg total) by mouth 2 (two) times daily with a meal. 60 tablet 2   No current facility-administered medications for this visit.   Vitals:   12/21/22 1044  BP: (!) 116/42  Pulse: 70  SpO2: 97%  Weight: 142 lb (64.4 kg)   Wt Readings from Last 3 Encounters:  12/21/22 142 lb (64.4 kg)  12/05/22 134 lb (60.8 kg)  12/05/22 134 lb 14.4 oz (61.2 kg)   Lab Results  Component Value Date   CREATININE 0.44 (L) 12/05/2022   CREATININE 0.44 11/03/2022   CREATININE 0.55 10/22/2022    PHYSICAL EXAM:  General:  Thin appearing No resp difficulty HEENT: normal Neck: supple. JVP flat. No lymphadenopathy or thryomegaly appreciated. Cor: PMI normal. Regular rate & rhythm. No rubs, gallops or murmurs. Lungs: clear although diminished in bilateral bases Abdomen: soft, nontender, nondistended. No hepatosplenomegaly. No bruits or masses.  Extremities: no cyanosis, clubbing, rash, 2 + pitting edema bilaterally with L>R Neuro: alert & oriented x3, cranial nerves grossly intact. Moves all 4 extremities w/o difficulty. Affect pleasant.   ECG: not done  ReDs:  42%   ASSESSMENT & PLAN:  1: NICM with preserved ejection fraction with LAE- - NYHA class III - moderately fluid overloaded with worsening pedal edema, worsening SOB, weight gain and elevated ReDs reading today - weighing daily; reminded to call for an overnight weight gain of > 2 pounds or weekly weight gain of > 5 pounds - weight up 8 pounds from last visit here 2 weeks ago - ReDs 42% - furoscix applied in office today as she had not taken her torsemide; can repeat over this weekend if needed for continued edema/ symptoms; if she uses the second dose, she is to not take her torsemide that day - BMP/ BNP today - pending those results, she may need potassium supplementation and will need to use the packets so as to go through her feeding tube; will call patient after lab results received - echo 01/12/22: EF of 50% along with severe LAE/ RAE and moderate TR.  - Echo 07/14/22: Near-normal LVEF.  However, there are signs of RV failure.  This does not look like definite constrictive pericarditis by echo. Suspect primarily RV failure.   - RHC/LHC 07/28/22:  1. Normal filling pressures.  2. Mild to moderate pulmonary arterial hypertension (PVR not markedly high, only 3.5 WU).  3. Preserved cardiac output.  4. No interventricular interdependence with simultaneous RV/LV pressure tracings, no evidence for constrictive pericarditis.  - cMRI 05/20/19: The left ventricle is normal in cavity size and wall thickness. Global systolic function is normal. The LV ejection fraction is 61%. There are no regional wall motion abnormalities. The right ventricle is normal in cavity size, wall thickness, and systolic function. There is mild bilateral  atrial dilatation. The aortic valve is trileaflet in morphology. There is no significant aortic valve stenosis or  regurgitation. There is no significant valvular disease. Delayed enhancement imaging demonstrates no evidence of myocardial infarction, scar or infiltrative  disease.  - So far, imaging has not been consistent with constrictive pericarditis.  Situation looks more like RV failure.   - discussion had about repeating cMRI but she had a lengthy admission and just got home a few days ago - not adding salt and has been reading food labels for sodium content - tries to be active but difficult with her fatigue/ SOB - continue torsemide 40mg  daily; refilled this today - continue spironolactone 25mg  daily - resume wearing her compression socks daily with removal at bedtime - elevate her legs when she can - history of UTI's so not a candidate for SGLT2 - BNP 06/14/22 was 299.4  2: HTN- - BP 116/42 - saw PCP Beryle Flock) 09/24  - BMP 12/05/22 showed sodium 141, potassium 3.5, creatinine 0.44 & GFR 109 - BMP today  3: PAF- - saw cardiothoracic surgeon ([name]) 03/24   - multiple previous ablations/DCCV  - s/p convergent procedure 12/14/21  - s/p DC PPM with LB area lead and AV node ablation 05/17/22 - continue eliquis 5mg  BID - saw cardiology [name]) 01/24  4: Pulmonary HTN- - Mild-moderate with PVR 3.5 WU on 4/24 RHC - She has OSA which may contribute. - Has significant RV failure.   5: Esophageal dysmotility- - G- tube placed 11/15/22 - TF 140 ml nutren 2 : 6 feeds/day,each feed over 1 hour through pump and 30 ml flushes  - has upcoming GI appt 12/28/22  6: Recurrent pleural effusions- - wearing oxygen at 2L around the clock - discussed pulmonary rehab after she finishes with home PT - Recurrent pleural effusions status post VATS/decortication (01/2022)  - she has follow up with Dr Danton Sewer in Vent clinic on 12/29/22  Return next week, sooner if needed.

## 2022-12-21 NOTE — Addendum Note (Signed)
Addended by: Electa Sniff on: 12/21/2022 12:39 PM   Modules accepted: Orders

## 2022-12-22 ENCOUNTER — Telehealth (HOSPITAL_COMMUNITY): Payer: Self-pay | Admitting: *Deleted

## 2022-12-22 ENCOUNTER — Other Ambulatory Visit: Payer: Medicaid Other

## 2022-12-22 ENCOUNTER — Telehealth: Payer: Self-pay | Admitting: Family Medicine

## 2022-12-22 ENCOUNTER — Other Ambulatory Visit: Payer: Self-pay

## 2022-12-22 ENCOUNTER — Telehealth: Payer: Self-pay

## 2022-12-22 MED ORDER — DOCUSATE SODIUM 50 MG/5ML PO LIQD: 100.0000 mg | Freq: Two times a day (BID) | ORAL | 5 refills | Status: DC

## 2022-12-22 MED ORDER — POTASSIUM CHLORIDE 20 MEQ PO PACK: 40.0000 meq | PACK | Freq: Every day | ORAL | 0 refills | Status: DC | PRN

## 2022-12-22 MED ORDER — OMEPRAZOLE 2 MG/ML ORAL SUSPENSION: 20.0000 mg | Freq: Two times a day (BID) | ORAL | 5 refills | Status: DC

## 2022-12-22 NOTE — Progress Notes (Signed)
Spoke with pt regarding lab results. Pt had good understanding and no further questions. Med sent in to pharmacy. Lab ordered.

## 2022-12-22 NOTE — Patient Instructions (Signed)
Visit Information  Thank you for taking time to visit with me today. Please don't hesitate to contact me if I can be of assistance to you before our next scheduled telephone appointment.  Our next, and final Transition of Care with [facility], appointment is by telephone on Oct 4 at 3pm  Following is a copy of your care plan:   Goals Addressed             This Visit's Progress    Transition of Care       Current Barriers:  Knowledge Deficits related to plan of care for management of diagnosed "Esophageal Dysmotility" with resultant Dysphagia and need for PEG tube placement for nutritional intake   RNCM Clinical Goal(s):  Patient will verbalize understanding of plan for management of diagnosed "Esophageal Dysmotility" resulting in dysphagia as evidenced by stated understanding for needed enteral feedings via PEG tube multiple times per day, need for aspiration precautions, need for Dietitian input and monitoring of Total Protein and Albumin/Gobulin ratio (A/G) labs, need to weigh self daily to ensure no further loss of weight as well as to discern an unintentional gain of 2+ pounds over 24 hours most likely indicating an increase in unwanted fluid volume and need to call Cardiologist regarding possible exacerbation of heart failure/fluid overload not experience hospital admission as evidenced by review of EMR. Hospital Admissions in last 6 months = [facility], 11/03/22 thru 11/30/22  through collaboration with RN Care manager, provider, and care team.   Interventions: 1:1 collaboration with primary care provider regarding development and update of comprehensive plan of care as evidenced by provider attestation and co-signature Inter-disciplinary care team collaboration (see longitudinal plan of care) Evaluation of current treatment plan related to  self management and patient's adherence to plan as established by provider   Management of Dysphagia   (Status:  On Track)  Short Term Goal Evaluation of  current treatment plan related to  Esophageal Dysmotility and resultant Dysphagia requiring PEG tube feedings , Level of care concerns self-management and patient's adherence to plan as established by provider. Discussed plans with patient for ongoing care management follow up and provided patient with direct contact information for care management team Evaluation of current treatment plan related to diagnosed Esophageal Dysmotility and patient's adherence to plan as established by provider Reviewed scheduled/upcoming provider appointments including PCP, [place] [facility], [place] [facility], and continued support from [facility] with RN, PT/OT/ST Reviewed upcoming scheduled appointment with LCSW Dickie La to discuss positive coping strategies regarding living with chronic health conditions  Patient Goals/Self-Care Activities: Take all medications as prescribed Attend all scheduled provider appointments Call pharmacy for medication refills 3-7 days in advance of running out of medications Call provider office for new concerns or questions  call office if I gain more than 2 pounds in one day or 5 pounds in one week meet with dietitian track weight in diary weigh myself daily bring diary to all appointments track symptoms and what helps feel better or worse Agreed to speak to LCSW Dickie La re coping with life-altering chronic health conditions. Appointment is 01/03/23 @3pm   Follow Up Plan:  Final Telephone follow up appointment with care management team member scheduled for:  October 4, at 3pm          Patient verbalizes understanding of instructions and care plan provided today and agrees to view in MyChart. Active MyChart status and patient understanding of how to access instructions and care plan via MyChart confirmed with patient.  The patient has been provided with contact information for the care management team and has been advised to call with any health  related questions or concerns.   Please call the care guide team at [phone] if you need to cancel or reschedule your appointment.   Please call 1-800-273-TALK (toll free, 24 hour hotline) if you are experiencing a Mental Health or Behavioral Health Crisis or need someone to talk to.  [name], RN, BA, [facility], CRRN [facility] Coordinator, Transition of Care Ph # [phone]

## 2022-12-22 NOTE — Patient Outreach (Signed)
Care Management  Transitions of Care Program Managed Medicaid Transitions of Care week 4   12/22/2022 Name: Julia Stark MRN: [id number] DOB: 1960/12/25  Subjective: Julia Stark is a 62 y.o. year old female who is a primary care patient of Bacigalupo, Marzella Schlein, MD. The Care Management team Engaged with patient Engaged with patient by telephone to assess and address transitions of care needs.   Consent to Services:  Patient was given information about Managed Medicaid Care Management services, agreed to services, and gave verbal consent to participate.   Assessment:           SDOH Interventions    Flowsheet Row Telephone from 12/14/2022 in [place] POPULATION HEALTH DEPARTMENT Telephone from 12/06/2022 in [place] POPULATION HEALTH DEPARTMENT Office Visit from 12/05/2022 in [facility] Office Visit from 02/27/2022 in [facility] Office Visit from 08/02/2021 in [facility] Office Visit from 01/11/2021 in [facility]  SDOH Interventions        Food Insecurity Interventions Intervention Not Indicated -- -- -- -- --  Housing Interventions Intervention Not Indicated -- -- -- -- --  Transportation Interventions Intervention Not Indicated -- -- -- -- --  Utilities Interventions Intervention Not Indicated -- -- -- -- --  Depression Interventions/Treatment  --  [referred patient to LCSW for counseling/assistance with coping with life-altering chronic illness] -- Currently on Treatment Currently on Treatment PHQ2-9 Score <4 Follow-up Not Indicated Currently on Treatment  Physical Activity Interventions -- Other (Comments)  [HHS PT 2Xs per week] -- -- -- --  Health Literacy Interventions Intervention Not Indicated -- -- -- -- --        Goals Addressed             This Visit's Progress    Transition of Care       Current Barriers:  Knowledge Deficits related to plan of care  for management of diagnosed "Esophageal Dysmotility" with resultant Dysphagia and need for PEG tube placement for nutritional intake   RNCM Clinical Goal(s):  Patient will verbalize understanding of plan for management of diagnosed "Esophageal Dysmotility" resulting in dysphagia as evidenced by stated understanding for needed enteral feedings via PEG tube multiple times per day, need for aspiration precautions, need for Dietitian input and monitoring of Total Protein and Albumin/Gobulin ratio (A/G) labs, need to weigh self daily to ensure no further loss of weight as well as to discern an unintentional gain of 2+ pounds over 24 hours most likely indicating an increase in unwanted fluid volume and need to call Cardiologist regarding possible exacerbation of heart failure/fluid overload not experience hospital admission as evidenced by review of EMR. Hospital Admissions in last 6 months = [facility], 11/03/22 thru 11/30/22  through collaboration with RN Care manager, provider, and care team.   Interventions: 1:1 collaboration with primary care provider regarding development and update of comprehensive plan of care as evidenced by provider attestation and co-signature Inter-disciplinary care team collaboration (see longitudinal plan of care) Evaluation of current treatment plan related to  self management and patient's adherence to plan as established by provider   Management of Dysphagia   (Status:  On Track)  Short Term Goal Evaluation of current treatment plan related to  Esophageal Dysmotility and resultant Dysphagia requiring PEG tube feedings , Level of care concerns self-management and patient's adherence to plan as established by provider. Discussed plans with patient for ongoing care management follow up and provided patient  with direct contact information for care management team Evaluation of current treatment plan related to diagnosed Esophageal Dysmotility and patient's adherence to plan as  established by provider Reviewed scheduled/upcoming provider appointments including PCP, [place] [facility], [place] [facility], and continued support from [facility] with RN, PT/OT/ST Reviewed upcoming scheduled appointment with LCSW Dickie La to discuss positive coping strategies regarding living with chronic health conditions  Patient Goals/Self-Care Activities: Take all medications as prescribed Attend all scheduled provider appointments Call pharmacy for medication refills 3-7 days in advance of running out of medications Call provider office for new concerns or questions  call office if I gain more than 2 pounds in one day or 5 pounds in one week meet with dietitian track weight in diary weigh myself daily bring diary to all appointments track symptoms and what helps feel better or worse Agreed to speak to LCSW Dickie La re coping with life-altering chronic health conditions. Appointment is 01/03/23 @3pm   Follow Up Plan:  Final Telephone follow up appointment with care management team member scheduled for:  October 4, at 3pm          Plan: The patient has been provided with contact information for the care management team and has been advised to call with any health related questions or concerns.   [name], RN, BA, [facility], CRRN [facility] Coordinator, Transition of Care Ph # [phone]

## 2022-12-22 NOTE — Telephone Encounter (Signed)
-----   Message from Delma Freeze sent at 12/21/2022 12:36 PM EDT ----- Potassium is normal but still on the low end of normal. Kidney function is normal. Take potassium packet today with furoscix and another if you use the 2nd furoscix dose. Mix the packet in 4-6 ounces of cold water. Get BMET drawn before you come to your appointment.

## 2022-12-22 NOTE — Telephone Encounter (Signed)
Patient called [facility] / refill request for the following medications: [facility] stated that medications need Prior Authorization & patient is almost out of medications. Dr. Roxan Hockey and CMA can you please help. Dr. Beryle Flock is not in th office today.   Please advise  metoCLOPramide (REGLAN) 5 MG/5ML solution    linaclotide (LINZESS) 72 MCG capsule    omeprazole (KONVOMEP) 2 mg/mL SUSP oral suspension     docusate (COLACE) 50 MG/5ML liquid

## 2022-12-23 MED ORDER — LINACLOTIDE 72 MCG PO CAPS: 72.0000 ug | ORAL_CAPSULE | Freq: Every day | ORAL | 5 refills | Status: DC | PRN

## 2022-12-23 MED ORDER — METOCLOPRAMIDE HCL 5 MG/5ML PO SOLN: 2.5000 mg | Freq: Two times a day (BID) | ORAL | 5 refills | Status: DC

## 2022-12-23 MED ORDER — DOCUSATE SODIUM 50 MG/5ML PO LIQD: 100.0000 mg | Freq: Two times a day (BID) | ORAL | 5 refills | Status: DC

## 2022-12-23 MED ORDER — OMEPRAZOLE 2 MG/ML ORAL SUSPENSION: 20.0000 mg | Freq: Two times a day (BID) | ORAL | 5 refills | Status: DC

## 2022-12-23 NOTE — Telephone Encounter (Signed)
Prescriptions resubmitted.

## 2022-12-25 ENCOUNTER — Encounter: Payer: Self-pay | Admitting: Family Medicine

## 2022-12-26 ENCOUNTER — Ambulatory Visit
Admission: RE | Admit: 2022-12-26 | Discharge: 2022-12-26 | Disposition: A | Discharge status: Home / Self Care | Payer: Medicaid Other | Source: Ambulatory Visit | Attending: Cardiology | Admitting: Cardiology

## 2022-12-26 DIAGNOSIS — R918 Other nonspecific abnormal finding of lung field: Secondary | ICD-10-CM | POA: Diagnosis not present

## 2022-12-26 DIAGNOSIS — J90 Pleural effusion, not elsewhere classified: Secondary | ICD-10-CM | POA: Diagnosis not present

## 2022-12-26 DIAGNOSIS — I5032 Chronic diastolic (congestive) heart failure: Secondary | ICD-10-CM | POA: Insufficient documentation

## 2022-12-26 DIAGNOSIS — J9811 Atelectasis: Secondary | ICD-10-CM | POA: Diagnosis not present

## 2022-12-26 DIAGNOSIS — R0602 Shortness of breath: Secondary | ICD-10-CM | POA: Diagnosis not present

## 2022-12-26 MED ORDER — IOHEXOL 300 MG/ML  SOLN
75.0000 mL | Freq: Once | INTRAMUSCULAR | Status: AC | PRN
  Administered 2022-12-26: 60 mL via INTRAVENOUS

## 2022-12-27 ENCOUNTER — Telehealth: Payer: Self-pay | Admitting: Family Medicine

## 2022-12-27 ENCOUNTER — Encounter: Payer: Self-pay | Admitting: Family

## 2022-12-27 ENCOUNTER — Other Ambulatory Visit: Payer: Self-pay | Admitting: Family Medicine

## 2022-12-27 MED ORDER — POLYETHYLENE GLYCOL 3350 17 GM/SCOOP PO POWD: 17.0000 g | Freq: Two times a day (BID) | ORAL | 1 refills | Status: DC | PRN

## 2022-12-27 NOTE — Telephone Encounter (Signed)
Pt called in regarding not having her medication for two weeks.  docusate (COLACE) 50 MG/5ML liquid   I called the pharmacy and per the pharmacy they cannot order this medication. Another medication will need to be called in for the pt.    [facility] [place], [place] - [address] Phone: [phone]  Fax: [phone]

## 2022-12-27 NOTE — Telephone Encounter (Signed)
Pt called in to check on her medication. I called the pharmacy, per pharmacy pt medication is needing PA. Pt is upset because she has been out of her medication for 2 weeks.    omeprazole (KONVOMEP) 2 mg/mL SUSP oral suspension   Please advise.     [facility] [place], [place] - [address] Phone: [phone]  Fax: [phone]

## 2022-12-27 NOTE — Telephone Encounter (Signed)
Please review for Dr. Leonard Schwartz

## 2022-12-27 NOTE — Telephone Encounter (Signed)
PA initiated-notes fax to plan

## 2022-12-28 ENCOUNTER — Encounter: Payer: Medicaid Other | Admitting: Family

## 2022-12-28 ENCOUNTER — Encounter: Payer: Self-pay | Admitting: Family

## 2022-12-28 ENCOUNTER — Ambulatory Visit: Payer: Medicaid Other | Admitting: Family

## 2022-12-28 ENCOUNTER — Other Ambulatory Visit
Admission: RE | Admit: 2022-12-28 | Discharge: 2022-12-28 | Disposition: A | Discharge status: Home / Self Care | Payer: Medicaid Other | Source: Ambulatory Visit | Attending: Family | Admitting: Family

## 2022-12-28 VITALS — BP 120/52 | HR 70 | Wt 140.2 lb

## 2022-12-28 DIAGNOSIS — I480 Paroxysmal atrial fibrillation: Secondary | ICD-10-CM

## 2022-12-28 DIAGNOSIS — J90 Pleural effusion, not elsewhere classified: Secondary | ICD-10-CM | POA: Diagnosis not present

## 2022-12-28 DIAGNOSIS — R1319 Other dysphagia: Secondary | ICD-10-CM

## 2022-12-28 DIAGNOSIS — I5032 Chronic diastolic (congestive) heart failure: Secondary | ICD-10-CM

## 2022-12-28 DIAGNOSIS — R1312 Dysphagia, oropharyngeal phase: Secondary | ICD-10-CM | POA: Diagnosis not present

## 2022-12-28 DIAGNOSIS — I272 Pulmonary hypertension, unspecified: Secondary | ICD-10-CM | POA: Diagnosis not present

## 2022-12-28 DIAGNOSIS — R634 Abnormal weight loss: Secondary | ICD-10-CM | POA: Diagnosis not present

## 2022-12-28 DIAGNOSIS — Z87891 Personal history of nicotine dependence: Secondary | ICD-10-CM | POA: Diagnosis not present

## 2022-12-28 DIAGNOSIS — I10 Essential (primary) hypertension: Secondary | ICD-10-CM

## 2022-12-28 DIAGNOSIS — R933 Abnormal findings on diagnostic imaging of other parts of digestive tract: Secondary | ICD-10-CM | POA: Diagnosis not present

## 2022-12-28 LAB — BASIC METABOLIC PANEL
Anion gap: 13 (ref 5–15)
BUN: 19 mg/dL (ref 8–23)
CO2: 41 mmol/L — ABNORMAL HIGH (ref 22–32)
Calcium: 9.2 mg/dL (ref 8.9–10.3)
Chloride: 83 mmol/L — ABNORMAL LOW (ref 98–111)
Creatinine, Ser: 0.32 mg/dL — ABNORMAL LOW (ref 0.44–1.00)
GFR, Estimated: >60 mL/min (ref >60)
Glucose, Bld: 126 mg/dL — ABNORMAL HIGH (ref 70–99)
Potassium: 3.1 mmol/L — ABNORMAL LOW (ref 3.5–5.1)
Sodium: 137 mmol/L (ref 135–145)

## 2022-12-28 NOTE — Telephone Encounter (Signed)
Message from the patient's plan:  Date: 12/28/2022 Request #: [id number] Physician name: [name] Office fax: [phone] Member name: Julia Stark Member ID: [id number] Member DOB: 11/27/1960 Drug requested: FIRST-OMEPRAZOLE 2 MG/ML SUSP Notes: This is a courtesy notification to advise you that we do not cover the above-requested  medication under this member's benefit plan.

## 2022-12-28 NOTE — Progress Notes (Signed)
PCP: [name], MD (last seen 09/24) Primary Cardiologist: Dorothyann Peng, MD (last seen 01/24) HF MD: Marca Ancona, MD (last seen 08/24)  HPI:  Julia Stark is a 62 y.o. female with history of permanent atrial fibrillation, diastolic CHF, and diabetes.Patient has a long history of atrial fibrillation.  She has failed multiple anti-arrhythmic agents (flecainide, amiodarone, dronedarone, and propafenone).  She had an initial ablation in 5/21, then a redo ablation in 8/22.  At the time of 08/22 ablation, foci of atrial tachycardia were noted that were not ablated due to proximity to the AV node. 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  She had ongoing atrial arrhythmia episodes and underwent convergent endocardial/epicardial ablation at [facility] in 9/23 with left atrial appendage clipping.  This was complicated by ileus and then left hemothorax. She developed a recurrent left pleural effusion and ended up with left VATS in 10/23.  Atrial fibrillation recurred, and ultimately, she had AV nodal ablation with [facility] [employer] DDD pacemaker with left bundle lead placed.    Since her surgery, she has had chronic pain radiating from left lower chest to upper back. Primarily located at the site where she had a chest tube at time of hemothorax.  Pain is pleuritic. She was started on colchicine which initially helped but eventually stopped helping and she has stopped the medication.    She has been chronically short of breath since her surgery in 09/23.  Hemodynamic RHC/LHC in 4/24 showed no evidence for ventricular interdependence.  This was not suggestive of constrictive pericarditis.  Echo 04/24 showed EF 50-55%, moderate RV dysfunction, moderate RV enlargement, PASP 46 mmHg, mild-moderate TR; no evidence for constrictive pericarditis.   She has had dysphagia and has been diagnosed by GI with esophageal dysmotility. She tried a medication for esophageal dysmotility but it  caused GI upset so she stopped it.   Admitted 11/04/22 due to 4 to 6 months of progressively worsening dysphagia and weight loss. She feels like food gets stuck in her upper chest/lower neck, and it takes sometimes hours for it to feel like it fully makes its way to her stomach. This sensation takes place about a minute after she swallows. She does not have any coughing with food or drink. G-tube placement on 8/14. VBG was 7.23/99 and improved with adherence to BIPAP. Neurology was consulted for concern for NMJ pathology. EMG with repetitive nerve stimulation was essentially normal, making NMJ pathology unlikely. Diaphragm fluoroscopy on 8/13 demonstrating abnormally decreased diaphragmatic excursion, left worse than right without paradoxical motion of sniff test. Neg acetylcholine receptor antibody. Arranged for her to have ASTRAL VENT with f/u with Dr Danton Sewer in Vent clinic on 12/29/22. EGD with FLIP on 8/5 demonstrating suspicion for abnormal contractility, recommendation was for formal esophageal manometry at outpatient follow-up. She has follow up with GI on 12/28/22. Videofluoroscopy on 8/7 demonstrated esophageal retention, aspiration, and oropharyngeal aspiration with pharyngeal muscle weakness, accordingly with unsafe swallow initially. G-tube was placed on 8/14. Tolerating tube feeds.she will be discharged on TF 140 ml nutren 2 : 6 feeds/day,each feed over 1 hour through pump and 30 ml flushes. MRI /MRA brain done to evaluate dysphagia but showed stenosis and possible small dural AVF.It showed chronic small vessel disease. CTA neck and brain done to follow on this could not confirm or rule it out dural AVF but did not show stenosis. Neurosurgery consulted and did not recommend MRV and they felt risk of MRV since it will be done under  [facility]( patient is claustrophobic and hypercarbic) outweigh the benefit. This all resulted in a lengthy admission of 26 days.  Echo 07/14/22: Near-normal LVEF.  However, there are  signs of RV failure.  This does not look like definite constrictive pericarditis by echo. Suspect primarily RV failure.  Plan for cath in future.   RHC/LHC 07/28/22:  1. Normal filling pressures.  2. Mild to moderate pulmonary arterial hypertension (PVR not markedly high, only 3.5 WU).  3. Preserved cardiac output.  4. No interventricular interdependence with simultaneous RV/LV pressure tracings, no evidence for constrictive pericarditis.   She presents today for a HF follow-up visit with a chief complaint of moderate shortness of breath with minimal exertion. Chronic in nature although feels like it might be a little bit better. Has associated fatigue, occasional chest pain, palpitations, occasional dizziness and pedal edema along with this. Denies cough, abdominal distention, weight gain or difficulty sleeping. Has not taken any diuretic yet today due to having to go to [facility] for her GI appt. Plans to take 1 torsemide when she gets home from here this afternoon. Sees pulm at [facility] tomorrow.  .   Saw GI earlier today. She says that they are waiting on speech recommendations where she can start eating more of the soft food (potatoes, applesauce etc) and if she gains weight or at least maintains, then the feeding tube will be removed.   At last visit, fuoscix was applied. Did receive 4 doses via the mail but hasn't had to use any. Was supposed to take potassium via her gtube with furoscix but she she just hasn't had time to pick it up.   She says that she has a cMRI scheduled for December but doesn't want to do it. She says that she gets very claustrophobic and they have to adjust her pacemaker. She would like to cancel it but I encouraged her to discuss this further with Dr. [name] next week. I explained that a MRI will give a better picture of her heart than what the echo shows. She says that she will discuss further with him next week.   Currently getting PT, OT and home health nursing.   ROS:  All systems negative except as listed in HPI, PMH and Problem List.  SH:  Social History   Socioeconomic History   Marital status: Divorced    Spouse name: Not on file   Number of children: 3   Years of education: 12   Highest education level: Some college, no degree  Occupational History    Employer: [employer]    Comment: in special chemistry  Tobacco Use   Smoking status: Former    Types: Cigarettes   Smokeless tobacco: Never  Vaping Use   Vaping status: Never Used  Substance and Sexual Activity   Alcohol use: Not Currently    Alcohol/week: 4.0 standard drinks of alcohol    Types: 2 Glasses of wine, 2 Cans of beer per week   Drug use: No   Sexual activity: Yes    Partners: Male    Birth control/protection: Post-menopausal, Surgical  Other Topics Concern   Not on file  Social History Narrative   Not on file   Social Determinants of Health   Financial Resource Strain: Low Risk  (12/01/2022)   Received from [facility]   Overall Financial Resource Strain (CARDIA)    Difficulty of Paying Living Expenses: Not very hard  Recent Concern: Financial Resource Strain - Medium Risk (12/01/2022)  Overall Financial Resource Strain (CARDIA)    Difficulty of Paying Living Expenses: Somewhat hard  Food Insecurity: No Food Insecurity (12/14/2022)   Hunger Vital Sign    Worried About Running Out of Food in the Last Year: Never true    Ran Out of Food in the Last Year: Never true  Recent Concern: Food Insecurity - Food Insecurity Present (10/02/2022)   Hunger Vital Sign    Worried About Running Out of Food in the Last Year: Sometimes true    Ran Out of Food in the Last Year: Never true  Transportation Needs: No Transportation Needs (12/14/2022)   PRAPARE - Administrator, Civil Service (Medical): No    Lack of Transportation (Non-Medical): No  Physical Activity: Inactive (12/06/2022)   Exercise Vital Sign    Days of Exercise per Week: 0 days    Minutes of  Exercise per Session: 0 min  Stress: Stress Concern Present (10/02/2022)   [employer] of Occupational Health - Occupational Stress Questionnaire    Feeling of Stress : To some extent  Social Connections: Socially Isolated (10/02/2022)   Social Connection and Isolation Panel [NHANES]    Frequency of Communication with Friends and Family: More than three times a week    Frequency of Social Gatherings with Friends and Family: Once a week    Attends Religious Services: Never    Database administrator or Organizations: No    Attends Engineer, structural: Not on file    Marital Status: Divorced  Intimate Partner Violence: Not At Risk (12/14/2022)   Humiliation, Afraid, Rape, and Kick questionnaire    Fear of Current or Ex-Partner: No    Emotionally Abused: No    Physically Abused: No    Sexually Abused: No    FH:  Family History  Problem Relation Age of Onset   Diabetes Mother    Hypertension Mother    Skin cancer Mother    Depression Mother    Alzheimer's disease Father    Diabetes Sister    Depression Sister    Diabetes Brother    Colon cancer Maternal Uncle    Hypertension Maternal Grandfather    Hyperlipidemia Maternal Grandfather    Diabetes Paternal Grandmother    Heart disease Paternal Grandmother    Prostate cancer Paternal Grandfather    Ovarian cancer Cousin    Breast cancer Neg Hx    Cervical cancer Neg Hx     Past Medical History:  Diagnosis Date   Actinic keratosis 07/19/2022   L chin - bx proven, needs LN2   Anxiety    Arthritis    bilateral shoulders   Atrial fibrillation (HCC)    Basal cell carcinoma    forehead   Cancer (HCC)    BASAL CELL CARCINOMA OF FACE   CHF (congestive heart failure) (HCC)    Colon polyp    COVID-19 11/26/2020   Depression    Diabetes mellitus without complication (HCC)    Dysrhythmia    A fib   Esophageal dysmotility    Hyperlipidemia    Hypertension    Internal hemorrhoids    Sialoadenitis 12/24/2018     Current Outpatient Medications  Medication Sig Dispense Refill   acetaminophen (TYLENOL) 160 MG/5ML liquid Take 500 mg by mouth every 4 (four) hours as needed for fever or pain.     apixaban (ELIQUIS) 5 MG TABS tablet Take 1 tablet (5 mg total) by mouth 2 (two) times daily. 60 tablet 6  docusate (COLACE) 50 MG/5ML liquid Take 10 mLs (100 mg total) by mouth 2 (two) times daily. 100 mL 5   Furosemide (FUROSCIX) 80 MG/10ML CTKT Inject 1 Cartridge into the skin as directed.     lidocaine 4 % Place 2 patches onto the skin daily. ON left side     linaclotide (LINZESS) 72 MCG capsule Take 1 capsule (72 mcg total) by mouth daily as needed. 30 capsule 5   melatonin 5 MG TABS Take 5 mg by mouth at bedtime.     metoCLOPramide (REGLAN) 5 MG/5ML solution Place 2.5 mLs (2.5 mg total) into feeding tube 2 (two) times daily. 120 mL 5   Multiple Vitamins-Minerals (TROPICAL LIQUID NUTRITION) LIQD Take 15 mLs by G tube once daily     omeprazole (KONVOMEP) 2 mg/mL SUSP oral suspension Take 10 mLs (20 mg total) by mouth 2 (two) times daily. 900 mL 5   polyethylene glycol powder (GLYCOLAX/MIRALAX) 17 GM/SCOOP powder Take 17 g by mouth 2 (two) times daily as needed. 850 g 1   potassium chloride (KLOR-CON) 20 MEQ packet Take 40 mEq by mouth daily as needed (when you use furoscix). 30 each 0   rosuvastatin (CRESTOR) 5 MG tablet Take 1 tablet (5 mg total) by mouth every other day. 45 tablet 1   spironolactone (ALDACTONE) 25 MG tablet Take 1 tablet (25 mg total) by mouth daily. 90 tablet 3   Torsemide 40 MG TABS Take 40 mg by mouth daily. 30 tablet 5   venlafaxine (EFFEXOR) 100 MG tablet Take 1 tablet (100 mg total) by mouth 2 (two) times daily with a meal. 60 tablet 2   No current facility-administered medications for this visit.   Vitals:   12/28/22 1525  BP: (!) 120/52  Pulse: 70  SpO2: 100%  Weight: 140 lb 4 oz (63.6 kg)   Wt Readings from Last 3 Encounters:  12/28/22 140 lb 4 oz (63.6 kg)  12/21/22 142  lb (64.4 kg)  12/05/22 134 lb (60.8 kg)   Lab Results  Component Value Date   CREATININE 0.39 (L) 12/21/2022   CREATININE 0.44 (L) 12/05/2022   CREATININE 0.44 11/03/2022   PHYSICAL EXAM:  General:  Thin appearing No resp difficulty HEENT: normal Neck: supple. JVP flat. No lymphadenopathy or thryomegaly appreciated. Cor: PMI normal. Regular rate & rhythm. No rubs, gallops or murmurs. Lungs: clear although diminished in bilateral bases Abdomen: soft, nontender, nondistended. No hepatosplenomegaly. No bruits or masses.  Extremities: no cyanosis, clubbing, rash, 1 + pitting edema bilaterally with L>R Neuro: alert & oriented x3, cranial nerves grossly intact. Moves all 4 extremities w/o difficulty. Affect pleasant.   ECG: not done  ReDs: 34%   ASSESSMENT & PLAN:  1: NICM with preserved ejection fraction with LAE- - NYHA class III - euvolemic  - weighing daily; reminded to call for an overnight weight gain of > 2 pounds or weekly weight gain of > 5 pounds - weight down 2 pounds from last visit here 1 week ago - ReDs 34%; 1 week ago it was 42% - did not use an additional doses of furoscix; reviewed when she could use it (weight gain, swelling etc) over a weekend / holiday - echo 01/12/22: EF of 50% along with severe LAE/ RAE and moderate TR.  - Echo 07/14/22: Near-normal LVEF.  However, there are signs of RV failure.  This does not look like definite constrictive pericarditis by echo. Suspect primarily RV failure.   - RHC/LHC 07/28/22:  1. Normal filling  pressures.  2. Mild to moderate pulmonary arterial hypertension (PVR not markedly high, only 3.5 WU).  3. Preserved cardiac output.  4. No interventricular interdependence with simultaneous RV/LV pressure tracings, no evidence for constrictive pericarditis.  - cMRI 05/20/19: The left ventricle is normal in cavity size and wall thickness. Global systolic function is normal. The LV ejection fraction is 61%. There are no regional wall  motion abnormalities. The right ventricle is normal in cavity size, wall thickness, and systolic function. There is mild bilateral atrial dilatation. The aortic valve is trileaflet in morphology. There is no significant aortic valve stenosis or  regurgitation. There is no significant valvular disease. Delayed enhancement imaging demonstrates no evidence of myocardial infarction, scar or infiltrative disease.  - So far, imaging has not been consistent with constrictive pericarditis.  Situation looks more like RV failure.   - currently has cMRI scheduled for December but she doesn't want to to; emphasized her talking with Dr [name] next week at her appt - not adding salt and has been reading food labels for sodium content - tries to be active but difficult with her fatigue/ SOB - continue torsemide 40mg  daily; hasn't taken it today due to [facility] appt, plans to take 20mg  when she gets home today - continue spironolactone 25mg  daily - continue compression socks daily with removal at bedtime - elevate her legs when she can - history of UTI's so not a candidate for SGLT2 - BNP 12/21/22 was 201.0  2: HTN- - BP 120/52 - saw PCP Beryle Flock) 09/24  - BMP 12/21/22 showed sodium 136, potassium 3.6, creatinine 0.39 & GFR >60 - BMP today  3: PAF- - saw cardiothoracic surgeon ([name]) 03/24   - multiple previous ablations/DCCV  - s/p convergent procedure 12/14/21  - s/p DC PPM with LB area lead and AV node ablation 05/17/22 - continue eliquis 5mg  BID - saw cardiology [name]) 01/24  4: Pulmonary HTN- - Mild-moderate with PVR 3.5 WU on 4/24 RHC - She has OSA which may contribute. - Has significant RV failure.   5: Esophageal dysmotility- - G- tube placed 11/15/22 - TF 140 ml nutren 2 : 6 feeds/day,each feed over 1 hour through pump and 30 ml flushes  - saw GI earlier today 12/28/22  6: Recurrent pleural effusions- - wearing oxygen at 2L around the clock - discussed pulmonary rehab after she  finishes with home PT - Recurrent pleural effusions status post VATS/decortication (01/2022)  - sees Dr Danton Sewer in Vent clinic on 12/29/22  Return next week, sooner if needed.

## 2022-12-28 NOTE — Patient Instructions (Signed)
If you need to use furoscix over a weekend or over a holiday, use it and then call us on the first business day after you use it.

## 2022-12-29 DIAGNOSIS — Z9049 Acquired absence of other specified parts of digestive tract: Secondary | ICD-10-CM | POA: Diagnosis not present

## 2022-12-29 DIAGNOSIS — J9612 Chronic respiratory failure with hypercapnia: Secondary | ICD-10-CM | POA: Diagnosis not present

## 2022-12-29 DIAGNOSIS — I5032 Chronic diastolic (congestive) heart failure: Secondary | ICD-10-CM | POA: Diagnosis not present

## 2022-12-29 DIAGNOSIS — E785 Hyperlipidemia, unspecified: Secondary | ICD-10-CM | POA: Diagnosis not present

## 2022-12-29 DIAGNOSIS — I110 Hypertensive heart disease with heart failure: Secondary | ICD-10-CM | POA: Diagnosis not present

## 2022-12-29 DIAGNOSIS — R058 Other specified cough: Secondary | ICD-10-CM | POA: Diagnosis not present

## 2022-12-29 DIAGNOSIS — G4734 Idiopathic sleep related nonobstructive alveolar hypoventilation: Secondary | ICD-10-CM | POA: Diagnosis not present

## 2022-12-29 DIAGNOSIS — G4733 Obstructive sleep apnea (adult) (pediatric): Secondary | ICD-10-CM | POA: Diagnosis not present

## 2022-12-29 DIAGNOSIS — Z87891 Personal history of nicotine dependence: Secondary | ICD-10-CM | POA: Diagnosis not present

## 2022-12-29 DIAGNOSIS — I480 Paroxysmal atrial fibrillation: Secondary | ICD-10-CM | POA: Diagnosis not present

## 2022-12-29 NOTE — Progress Notes (Signed)
My chart message sent to pt.  Carlisle Beers, FNP   Ok to send mychart message: Kidney function looks great. Potassium is low so need to start taking the potassium packets. Use 3 today ( ) and then the next day, start taking 2 packets ( ) daily. Labs can be checked next week when you return.

## 2023-01-01 DIAGNOSIS — J984 Other disorders of lung: Secondary | ICD-10-CM | POA: Diagnosis not present

## 2023-01-01 DIAGNOSIS — R1314 Dysphagia, pharyngoesophageal phase: Secondary | ICD-10-CM | POA: Diagnosis not present

## 2023-01-01 DIAGNOSIS — G1229 Other motor neuron disease: Secondary | ICD-10-CM | POA: Diagnosis not present

## 2023-01-01 NOTE — Progress Notes (Signed)
Called patient and relayed message below. Pt states she started potassium today, and has taken 2 packets. She states she will take a third packet to equal 60 meq and verbalized understanding to start taking 2 packets (40 meq) daily. Pt confirms upcoming appt date and time for this week.

## 2023-01-02 ENCOUNTER — Encounter: Payer: Self-pay | Admitting: Family Medicine

## 2023-01-02 ENCOUNTER — Ambulatory Visit: Payer: Medicaid Other | Admitting: Family Medicine

## 2023-01-02 VITALS — BP 120/53 | HR 70 | Temp 98.6°F | Ht 68.0 in | Wt 143.0 lb

## 2023-01-02 DIAGNOSIS — I110 Hypertensive heart disease with heart failure: Secondary | ICD-10-CM | POA: Diagnosis not present

## 2023-01-02 DIAGNOSIS — R112 Nausea with vomiting, unspecified: Secondary | ICD-10-CM | POA: Diagnosis not present

## 2023-01-02 DIAGNOSIS — Z431 Encounter for attention to gastrostomy: Secondary | ICD-10-CM | POA: Diagnosis not present

## 2023-01-02 DIAGNOSIS — Z7901 Long term (current) use of anticoagulants: Secondary | ICD-10-CM | POA: Diagnosis not present

## 2023-01-02 DIAGNOSIS — E873 Alkalosis: Secondary | ICD-10-CM | POA: Diagnosis not present

## 2023-01-02 DIAGNOSIS — R1314 Dysphagia, pharyngoesophageal phase: Secondary | ICD-10-CM | POA: Diagnosis not present

## 2023-01-02 DIAGNOSIS — I441 Atrioventricular block, second degree: Secondary | ICD-10-CM | POA: Diagnosis not present

## 2023-01-02 DIAGNOSIS — E119 Type 2 diabetes mellitus without complications: Secondary | ICD-10-CM | POA: Diagnosis not present

## 2023-01-02 DIAGNOSIS — R627 Adult failure to thrive: Secondary | ICD-10-CM | POA: Diagnosis not present

## 2023-01-02 DIAGNOSIS — F331 Major depressive disorder, recurrent, moderate: Secondary | ICD-10-CM | POA: Diagnosis not present

## 2023-01-02 DIAGNOSIS — K5909 Other constipation: Secondary | ICD-10-CM

## 2023-01-02 DIAGNOSIS — J90 Pleural effusion, not elsewhere classified: Secondary | ICD-10-CM | POA: Diagnosis not present

## 2023-01-02 DIAGNOSIS — E43 Unspecified severe protein-calorie malnutrition: Secondary | ICD-10-CM | POA: Diagnosis not present

## 2023-01-02 DIAGNOSIS — Z6821 Body mass index (BMI) 21.0-21.9, adult: Secondary | ICD-10-CM | POA: Diagnosis not present

## 2023-01-02 DIAGNOSIS — Z9889 Other specified postprocedural states: Secondary | ICD-10-CM | POA: Diagnosis not present

## 2023-01-02 DIAGNOSIS — K224 Dyskinesia of esophagus: Secondary | ICD-10-CM | POA: Diagnosis not present

## 2023-01-02 DIAGNOSIS — J9622 Acute and chronic respiratory failure with hypercapnia: Secondary | ICD-10-CM | POA: Diagnosis not present

## 2023-01-02 DIAGNOSIS — F411 Generalized anxiety disorder: Secondary | ICD-10-CM | POA: Diagnosis not present

## 2023-01-02 DIAGNOSIS — Z87891 Personal history of nicotine dependence: Secondary | ICD-10-CM | POA: Diagnosis not present

## 2023-01-02 DIAGNOSIS — I5032 Chronic diastolic (congestive) heart failure: Secondary | ICD-10-CM | POA: Diagnosis not present

## 2023-01-02 DIAGNOSIS — Z9981 Dependence on supplemental oxygen: Secondary | ICD-10-CM | POA: Diagnosis not present

## 2023-01-02 DIAGNOSIS — I4821 Permanent atrial fibrillation: Secondary | ICD-10-CM | POA: Diagnosis not present

## 2023-01-02 DIAGNOSIS — Z950 Presence of cardiac pacemaker: Secondary | ICD-10-CM | POA: Diagnosis not present

## 2023-01-02 DIAGNOSIS — R519 Headache, unspecified: Secondary | ICD-10-CM | POA: Diagnosis not present

## 2023-01-02 DIAGNOSIS — I272 Pulmonary hypertension, unspecified: Secondary | ICD-10-CM | POA: Diagnosis not present

## 2023-01-02 DIAGNOSIS — G4733 Obstructive sleep apnea (adult) (pediatric): Secondary | ICD-10-CM | POA: Diagnosis not present

## 2023-01-02 MED ORDER — VENLAFAXINE HCL 75 MG PO TABS: 75.0000 mg | ORAL_TABLET | Freq: Two times a day (BID) | ORAL | 1 refills | Status: DC

## 2023-01-02 NOTE — Progress Notes (Signed)
Established Patient Office Visit  Subjective   Patient ID: Julia Stark, female    DOB: 17-Jan-1961  Age: 62 y.o. MRN: [id number]  Chief Complaint  Patient presents with   Medication managment   Hena is here today for medical management of chronic conditions. Last visit, she was experiencing increased depression and anxiety symptoms due to recent hospitalization and health concerns. Her Effexor dose was increased. She only took the 100 mg of Effexor BID for two days and then went back down to her previous 75 mg BID. She said she was not in a place to take the increased dose. She said she was sleepy all of the time during those two days. Her mood is slowly improving. She has no other questions or concerns at this time.  Review of Systems  Respiratory:  Positive for shortness of breath.   Gastrointestinal:  Positive for abdominal pain.  Neurological:  Positive for headaches.  Psychiatric/Behavioral:  Positive for depression. The patient is nervous/anxious.      Objective:    BP (!) 120/53 (BP Location: Left Arm, Patient Position: Sitting, Cuff Size: Normal)   Pulse 70   Temp 98.6 F (37 C) (Oral)   Ht 5\' 8"  (1.727 m)   Wt 143 lb (64.9 kg)   LMP 04/13/2011   SpO2 100%   BMI 21.74 kg/m  BP Readings from Last 3 Encounters:  01/02/23 (!) 120/53  12/28/22 (!) 120/52  12/21/22 (!) 116/42   Wt Readings from Last 3 Encounters:  01/02/23 143 lb (64.9 kg)  12/28/22 140 lb 4 oz (63.6 kg)  12/21/22 142 lb (64.4 kg)   Physical Exam Constitutional:      Appearance: Normal appearance.  Cardiovascular:     Rate and Rhythm: Normal rate and regular rhythm.     Heart sounds: Normal heart sounds.  Pulmonary:     Effort: Pulmonary effort is normal.  Abdominal:     General: Abdomen is flat.     Palpations: Abdomen is soft.     Tenderness: There is abdominal tenderness (in LLQ).  Neurological:     General: No focal deficit present.     Mental Status: [name] alert and oriented to  person, place, and time.   No results found for any visits on 01/02/23.  Last CBC Lab Results  Component Value Date   WBC 5.3 12/05/2022   HGB 8.0 (L) 12/05/2022   HCT 27.2 (L) 12/05/2022   MCV 93 12/05/2022   MCH 27.3 12/05/2022   RDW 14.3 12/05/2022   PLT 228 12/05/2022   Last metabolic panel Lab Results  Component Value Date   GLUCOSE 126 (H) 12/28/2022   NA 137 12/28/2022   K 3.1 (L) 12/28/2022   CL 83 (L) 12/28/2022   CO2 41 (H) 12/28/2022   BUN 19 12/28/2022   CREATININE 0.32 (L) 12/28/2022   GFRNONAA >60 12/28/2022   CALCIUM 9.2 12/28/2022   PROT 7.0 12/05/2022   ALBUMIN 4.0 12/05/2022   LABGLOB 3.0 12/05/2022   AGRATIO 1.5 07/15/2021   BILITOT 0.5 12/05/2022   ALKPHOS 102 12/05/2022   AST 24 12/05/2022   ALT 13 12/05/2022   ANIONGAP 13 12/28/2022   The 10-year ASCVD risk score (Arnett DK, et al., 2019) is: 9.1%    Assessment & Plan:   Problem List Items Addressed This Visit       Digestive   Chronic constipation    Abdominal tenderness likely due to Constipation  Continue linzess  Encouraged taking  miralax every day         Other   Anxiety, generalized    Chronic  Increased Effexor to 100 mg BID at last visit, only took two days  Patient mood slowly improving Per patient preference, will decrease Effexor back to 75 mg (previous dose) Discussed the importance of therapy       Relevant Medications   venlafaxine (EFFEXOR) 75 MG tablet   Moderate episode of recurrent major depressive disorder (HCC) - Primary    Chronic  Increased dose of Effexor to 100 mg BID at last visit, only took two days  Patient mood slowly improving Per patient preference, will decrease Effexor back to 75 mg (previous dose) Discussed the importance of therapy       Relevant Medications   venlafaxine (EFFEXOR) 75 MG tablet    Return in about 2 months (around 03/04/2023) for CPE, as scheduled.    Rometta Emery, Medical Student  Patient seen along with MS3  student Jodi Marble. I personally evaluated this patient along with the student, and verified all aspects of the history, physical exam, and medical decision making as documented by the student. I agree with the student's documentation and have made all necessary edits.  Jaleea Alesi, Marzella Schlein, MD, MPH [facility]

## 2023-01-02 NOTE — Assessment & Plan Note (Addendum)
Chronic  Increased dose of Effexor to 100 mg BID at last visit, only took two days  Patient mood slowly improving Per patient preference, will decrease Effexor back to 75 mg (previous dose) Discussed the importance of therapy

## 2023-01-02 NOTE — Assessment & Plan Note (Addendum)
Abdominal tenderness likely due to Constipation  Continue linzess  Encouraged taking miralax every day

## 2023-01-02 NOTE — Assessment & Plan Note (Addendum)
Chronic  Increased Effexor to 100 mg BID at last visit, only took two days  Patient mood slowly improving Per patient preference, will decrease Effexor back to 75 mg (previous dose) Discussed the importance of therapy

## 2023-01-03 ENCOUNTER — Other Ambulatory Visit: Payer: Medicaid Other | Admitting: Licensed Clinical Social Worker

## 2023-01-03 DIAGNOSIS — I272 Pulmonary hypertension, unspecified: Secondary | ICD-10-CM | POA: Diagnosis not present

## 2023-01-03 DIAGNOSIS — Z9889 Other specified postprocedural states: Secondary | ICD-10-CM | POA: Diagnosis not present

## 2023-01-03 DIAGNOSIS — E119 Type 2 diabetes mellitus without complications: Secondary | ICD-10-CM | POA: Diagnosis not present

## 2023-01-03 DIAGNOSIS — K224 Dyskinesia of esophagus: Secondary | ICD-10-CM | POA: Diagnosis not present

## 2023-01-03 DIAGNOSIS — Z7901 Long term (current) use of anticoagulants: Secondary | ICD-10-CM | POA: Diagnosis not present

## 2023-01-03 DIAGNOSIS — E873 Alkalosis: Secondary | ICD-10-CM | POA: Diagnosis not present

## 2023-01-03 DIAGNOSIS — Z950 Presence of cardiac pacemaker: Secondary | ICD-10-CM | POA: Diagnosis not present

## 2023-01-03 DIAGNOSIS — G4733 Obstructive sleep apnea (adult) (pediatric): Secondary | ICD-10-CM | POA: Diagnosis not present

## 2023-01-03 DIAGNOSIS — R519 Headache, unspecified: Secondary | ICD-10-CM | POA: Diagnosis not present

## 2023-01-03 DIAGNOSIS — E43 Unspecified severe protein-calorie malnutrition: Secondary | ICD-10-CM | POA: Diagnosis not present

## 2023-01-03 DIAGNOSIS — J9622 Acute and chronic respiratory failure with hypercapnia: Secondary | ICD-10-CM | POA: Diagnosis not present

## 2023-01-03 DIAGNOSIS — R627 Adult failure to thrive: Secondary | ICD-10-CM | POA: Diagnosis not present

## 2023-01-03 DIAGNOSIS — I110 Hypertensive heart disease with heart failure: Secondary | ICD-10-CM | POA: Diagnosis not present

## 2023-01-03 DIAGNOSIS — Z87891 Personal history of nicotine dependence: Secondary | ICD-10-CM | POA: Diagnosis not present

## 2023-01-03 DIAGNOSIS — R0902 Hypoxemia: Secondary | ICD-10-CM | POA: Diagnosis not present

## 2023-01-03 DIAGNOSIS — Z431 Encounter for attention to gastrostomy: Secondary | ICD-10-CM | POA: Diagnosis not present

## 2023-01-03 DIAGNOSIS — F331 Major depressive disorder, recurrent, moderate: Secondary | ICD-10-CM | POA: Diagnosis not present

## 2023-01-03 DIAGNOSIS — I441 Atrioventricular block, second degree: Secondary | ICD-10-CM | POA: Diagnosis not present

## 2023-01-03 DIAGNOSIS — R1314 Dysphagia, pharyngoesophageal phase: Secondary | ICD-10-CM | POA: Diagnosis not present

## 2023-01-03 DIAGNOSIS — Z6821 Body mass index (BMI) 21.0-21.9, adult: Secondary | ICD-10-CM | POA: Diagnosis not present

## 2023-01-03 DIAGNOSIS — Z9981 Dependence on supplemental oxygen: Secondary | ICD-10-CM | POA: Diagnosis not present

## 2023-01-03 DIAGNOSIS — K5909 Other constipation: Secondary | ICD-10-CM | POA: Diagnosis not present

## 2023-01-03 DIAGNOSIS — R112 Nausea with vomiting, unspecified: Secondary | ICD-10-CM | POA: Diagnosis not present

## 2023-01-03 DIAGNOSIS — I4821 Permanent atrial fibrillation: Secondary | ICD-10-CM | POA: Diagnosis not present

## 2023-01-03 DIAGNOSIS — I5032 Chronic diastolic (congestive) heart failure: Secondary | ICD-10-CM | POA: Diagnosis not present

## 2023-01-03 DIAGNOSIS — J90 Pleural effusion, not elsewhere classified: Secondary | ICD-10-CM | POA: Diagnosis not present

## 2023-01-03 NOTE — Patient Instructions (Signed)
Visit Information  Ms. Turay was given information about Medicaid Managed Care team care coordination services as a part of their Healthy [employer] Medicaid benefit. [name] verbally consented to engagement with the [facility] Managed Care team.   If you are experiencing a medical emergency, please call 911 or report to your local emergency department or urgent care.   If you have a non-emergency medical problem during routine business hours, please contact your provider's office and ask to speak with a nurse.   For questions related to your Healthy [facility] health plan, please call: [phone] or visit the homepage here: [email or web address]  If you would like to schedule transportation through your Healthy [facility] plan, please call the following number at least 2 days in advance of your appointment: [phone]  For information about your ride after you set it up, call Ride Assist at [phone]. Use this number to activate a Will Call pickup, or if your transportation is late for a scheduled pickup. Use this number, too, if you need to make a change or cancel a previously scheduled reservation.  If you need transportation services right away, call [phone]. The after-hours call center is staffed 24 hours to handle ride assistance and urgent reservation requests (including discharges) 365 days a year. Urgent trips include sick visits, hospital discharge requests and life-sustaining treatment.  Call the [facility] Line at [phone], at any time, 24 hours a day, 7 days a week. If you are in danger or need immediate medical attention call 911.  If you would like help to quit smoking, call 1-800-QUIT-NOW (915-787-1898) OR Espaol: 1-855-Djelo-Ya (2-376-283-1517) o para ms informacin haga clic aqu or Text READY to 616-073 to register via text  Following is a copy of your plan of care:  Care Plan : LCSW Plan of Care   Updates made by Gustavus Bryant, LCSW since 01/03/2023 12:00 AM     Problem: Coping Skills (General Plan of Care)      Goal: Coping Skills Enhanced   Start Date: 12/20/2022  Priority: High  Note:   Priority: High  Timeframe:  Short-Range Goal Priority:  High Start Date:   12/20/22               Expected End Date:  ongoing                     Follow Up Date--01/31/23 at 2 pm  - keep 90 percent of scheduled appointments -consider counseling or psychiatry -consider bumping up your self-care  -consider creating a stronger support network   Why is this important?             Combatting depression may take some time.            If you don't feel better right away, don't give up on your treatment plan.    Current barriers:   Chronic Mental Health needs related to stress and anxiety regarding managing her health and new normal. Patient requires Support, Education, Resources, Referrals, Advocacy, and Care Coordination, in order to meet Unmet Mental Health Needs To Find a Therapist.  Patient will implement clinical interventions discussed today to decrease symptoms of depression and increase knowledge and/or ability of: coping skills. Mental Health Concerns and Social Isolation Patient lacks knowledge of available community counseling agencies and resources.  Clinical Goal(s): verbalize understanding of plan for management of Anxiety, Depression, and Stress and demonstrate a reduction in symptoms. Patient will connect with a provider for ongoing  mental health treatment, increase coping skills, healthy habits, self-management skills, and stress reduction        Patient Goals/Self-Care Activities: Take medications as prescribed   Attend all scheduled provider appointments Call pharmacy for medication refills 3-7 days in advance of running out of medications Perform all self care activities independently  Perform IADL's (shopping, preparing meals, housekeeping, managing finances)  independently Call provider office for new concerns or questions Work with the social worker to address care coordination needs and will continue to work with the clinical team to address health care and disease management related needs call 1-800-273-TALK (toll free, 24 hour hotline) go to [facility] [address], [place] [phone]) if an urgent crisis arises  call 911 if experiencing a Mental Health or Behavioral Health Crisis  Utilize healthy coping skills and supportive resources discussed Contact PCP with any questions or concerns Keep 90 percent of counseling appointments Call your insurance provider for more information about your Enhanced Benefits  Check out counseling resources provided  Begin personal counseling with LCSW, to reduce and manage symptoms of Depression and Stress, until well-established with mental health provider Accept all calls from representative with [facility] in an effort to establish ongoing mental health counseling and supportive services. Incorporate into daily practice - relaxation techniques, deep breathing exercises, and mindfulness meditation strategies. Talk about feelings with friends, family members, spiritual advisor, etc. Contact LCSW directly [phone]), if you have questions, need assistance, or if additional social work needs are identified between now and our next scheduled telephone outreach call. Call 988 for mental health hotline/crisis line if needed (24/7 available) Try techniques to reduce symptoms of anxiety/negative thinking (deep breathing, distraction, positive self talk, etc)  - develop a personal safety plan - develop a plan to deal with triggers like holidays, anniversaries - exercise at least 2 to 3 times per week - have a plan for how to handle bad days - journal feelings and what helps to feel better or worse - spend time or talk with others at least 2 to 3 times per week -  watch for early signs of feeling worse - begin personal counseling - call and visit an old friend - check out volunteer opportunities - join a support group - laugh; watch a funny movie or comedian - learn and use visualization or guided imagery - perform a random act of kindness - practice relaxation or meditation daily - start or continue a personal journal - practice positive thinking and self-talk -continue with compliance of taking medication  -identify current effective and ineffective coping strategies.  -implement positive self-talk in care to increase self-esteem, confidence and feelings of control.  -consider alternative and complementary therapy approaches such as meditation, mindfulness or yoga.  Call your insurance provider to gain education on benefits if desired Call your primary care doctor if symptoms get worse  -journaling, prayer, worship services, meditation or pastoral counseling.  -increase participation in pleasurable group activities such as hobbies, singing, sports or volunteering).  -consider the use of meditative movement therapy such as tai chi, yoga or qigong.  -start a regular daily exercise program based on tolerance, ability and patient choice to support positive thinking and activity    Follow Up Plan:  The patient has been provided with contact information for the care management team and has been advised to call with any mental health or health related questions or concerns.  The care management team will reach out to the patient again over  the next 30 business  days.   If you are experiencing a Mental Health or Behavioral Health Crisis or need someone to talk to, please call the Suicide and Crisis Lifeline: 988    Patient Goals: Follow up goal     24- Hour Availability:    [facility]  [address] [place], [place] [phone] Crisis [phone]   [facility]  [phone]  [place] [facility]  [phone]    [facility]  [phone] (after hours)   Therapeutic Alternative/Mobile Crisis   [phone]   [place] National Suicide Hotline  803-434-6579 [place]) [place] [address]   Call [phone])[phone] for mental health emergencies   [facility]  [phone]);  [place] and [employer]  [phone]); [place], [place], [place], [place], [place], [place], [place]    [place] [facility] for [facility] Residents For 24/7 walk-up access to mental health services for [facility] children (4+), adolescents and adults, please visit the [facility] located at [address] in [place], [place].  *[facility] also provides comprehensive outpatient behavioral health services in a variety of locations around the [place].  Connect With [place] [address] [place], [place] [address] HelpLine: [phone] or 1-(719)172-7691  Get Directions  Find Help 24/7 By Phone Call our 24-hour HelpLine at 534-469-8816 or 214-582-1376 for immediate assistance for mental health and substance abuse issues.  Walk-In Help [place]: [employer] (Ages 4 and Up) [place]: Emergency Dept., [facility] Additional Resources National Hopeline Network: 1-800-SUICIDE The National Suicide Prevention Lifeline: 1-800-273-TALK     The following coping skill education was provided for stress relief and mental health management: "When your car dies or a deadline looms, how do you respond? Long-term, low-grade or acute stress takes a serious toll on your body and mind, so don't ignore feelings of constant tension. Stress is a natural part of life. However, too much stress can harm our health, especially if it continues every day. This is chronic stress and can put you at risk for heart problems like heart disease and  depression. Understand what's happening inside your body and learn simple coping skills to combat the negative impacts of everyday stressors.  Types of Stress There are two types of stress: Emotional - types of emotional stress are relationship problems, pressure at work, financial worries, experiencing discrimination or having a major life change. Physical - Examples of physical stress include being sick having pain, not sleeping well, recovery from an injury or having an alcohol and drug use disorder. Fight or Flight Sudden or ongoing stress activates your nervous system and floods your bloodstream with adrenaline and cortisol, two hormones that raise blood pressure, increase heart rate and spike blood sugar. These changes pitch your body into a fight or flight response. That enabled our ancestors to outrun saber-toothed tigers, and it's helpful today for situations like dodging a car accident. But most modern chronic stressors, such as finances or a challenging relationship, keep your body in that heightened state, which hurts your health. Effects of Too Much Stress If constantly under stress, most of [place] will eventually start to function less well.  Multiple studies link chronic stress to a higher risk of heart disease, stroke, depression, weight gain, memory loss and even premature death, so it's important to recognize the warning signals. Talk to your doctor about ways to manage stress if you're  experiencing any of these symptoms: Prolonged periods of poor sleep. Regular, severe headaches. Unexplained weight loss or gain. Feelings of isolation, withdrawal or worthlessness. Constant anger and irritability. Loss of interest in activities. Constant worrying or obsessive thinking. Excessive alcohol or drug use. Inability to concentrate.  10 Ways to Cope with Chronic Stress It's key to recognize stressful situations as they occur because it allows you to focus on managing how you react. We all  need to know when to close our eyes and take a deep breath when we feel tension rising. Use these tips to prevent or reduce chronic stress. 1. Rebalance Work and Home All work and no play? If you're spending too much time at the office, intentionally put more dates in your calendar to enjoy time for fun, either alone or with others. 2. Get Regular Exercise Moving your body on a regular basis balances the nervous system and increases blood circulation, helping to flush out stress hormones. Even a daily 20-minute walk makes a difference. Any kind of exercise can lower stress and improve your mood ? just pick activities that you enjoy and make it a regular habit. 3. Eat Well and Limit Alcohol and Stimulants Alcohol, nicotine and caffeine may temporarily relieve stress but have negative health impacts and can make stress worse in the long run. Well-nourished bodies cope better, so start with a good breakfast, add more organic fruits and vegetables for a well-balanced diet, avoid processed foods and sugar, try herbal tea and drink more water. 4. Connect with Supportive People Talking face to face with another person releases hormones that reduce stress. Lean on those good listeners in your life. 5. Carve Out Hobby Time Do you enjoy gardening, reading, listening to music or some other creative pursuit? Engage in activities that bring you pleasure and joy; research shows that reduces stress by almost half and lowers your heart rate, too. 6. Practice Meditation, Stress Reduction or Yoga Relaxation techniques activate a state of restfulness that counterbalances your body's fight-or-flight hormones. Even if this also means a 10-minute break in a long day: listen to music, read, go for a walk in nature, do a hobby, take a bath or spend time with a friend. Also consider doing a mindfulness exercise or try a daily deep breathing or imagery practice. Deep Breathing Slow, calm and deep breathing can help you relax.  Try these steps to focus on your breathing and repeat as needed. Find a comfortable position and close your eyes. Exhale and drop your shoulders. Breathe in through your nose; fill your lungs and then your belly. Think of relaxing your body, quieting your mind and becoming calm and peaceful. Breathe out slowly through your nose, relaxing your belly. Think of releasing tension, pain, worries or distress. Repeat steps three and four until you feel relaxed. Imagery This involves using your mind to excite the senses -- sound, vision, smell, taste and feeling. This may help ease your stress. Begin by getting comfortable and then do some slow breathing. Imagine a place you love being at. It could be somewhere from your childhood, somewhere you vacationed or just a place in your imagination. Feel how it is to be in the place you're imagining. Pay attention to the sounds, air, colors, and who is there with you. This is a place where you feel cared for and loved. All is well. You are safe. Take in all the smells, sounds, tastes and feelings. As you do, feel your body being nourished and healed. Feel  the calm that surrounds you. Breathe in all the good. Breathe out any discomfort or tension. 7. Sleep Enough If you get less than seven to eight hours of sleep, your body won't tolerate stress as well as it could. If stress keeps you up at night, address the cause, and add extra meditation into your day to make up for the lost z's. Try to get seven to nine hours of sleep each night. Make a regular bedtime schedule. Keep your room dark and cool. Try to avoid computers, TV, cell phones and tablets before bed. 8. Bond with Connections You Enjoy Go out for a coffee with a friend, chat with a neighbor, call a family member, visit with a clergy member, or even hang out with your pet. Clinical studies show that spending even a short time with a companion animal can cut anxiety levels almost in half. 9. Take a  Vacation Getting away from it all can reset your stress tolerance by increasing your mental and emotional outlook, which makes you a happier, more productive person upon return. Leave your cellphone and laptop at home! 10. See a Counselor, Coach or Therapist If negative thoughts overwhelm your ability to make positive changes, it's time to seek professional help. Make an appointment today--your health and life are worth it."  Dickie La, BSW, MSW, [employer] Managed Medicaid LCSW [facility]  [facility] [email or web address] Phone: [phone]

## 2023-01-03 NOTE — Patient Outreach (Signed)
Medicaid Managed Care Social Work Note  01/03/2023 Name:  Julia Stark MRN:  [id number] DOB:  01/23/1961  Julia Stark is an 62 y.o. year old female who is a primary patient of Bacigalupo, Marzella Schlein, MD.  The Medicaid Managed Care Coordination team was consulted for assistance with:  Mental Health Counseling and Resources  Ms. Caillier was given information about Medicaid Managed Care Coordination team services today. [name] Patient agreed to services and verbal consent obtained.  Engaged with patient  for by telephone forfollow up visit in response to referral for case management and/or care coordination services.   Assessments/Interventions:  Review of past medical history, allergies, medications, health status, including review of consultants reports, laboratory and other test data, was performed as part of comprehensive evaluation and provision of chronic care management services.  SDOH: (Social Determinant of Health) assessments and interventions performed: SDOH Interventions    Flowsheet Row Telephone from 12/14/2022 in [place] POPULATION HEALTH DEPARTMENT Telephone from 12/06/2022 in [place] POPULATION HEALTH DEPARTMENT Office Visit from 12/05/2022 in [facility] Office Visit from 02/27/2022 in [facility] Office Visit from 08/02/2021 in [facility] Office Visit from 01/11/2021 in [facility]  SDOH Interventions        Food Insecurity Interventions Intervention Not Indicated -- -- -- -- --  Housing Interventions Intervention Not Indicated -- -- -- -- --  Transportation Interventions Intervention Not Indicated -- -- -- -- --  Utilities Interventions Intervention Not Indicated -- -- -- -- --  Depression Interventions/Treatment  --  [referred patient to LCSW for counseling/assistance with coping with life-altering chronic illness] -- Currently on Treatment Currently on  Treatment PHQ2-9 Score <4 Follow-up Not Indicated Currently on Treatment  Physical Activity Interventions -- Other (Comments)  [HHS PT 2Xs per week] -- -- -- --  Health Literacy Interventions Intervention Not Indicated -- -- -- -- --       Advanced Directives Status:  See Care Plan for related entries.  Care Plan                 Allergies  Allergen Reactions   Acetazolamide Other (See Comments)    Confusion   Amiodarone Other (See Comments)    Other Reaction(s): Other (See Comments), Vomiting  Blurred vision   Flecainide Nausea And Vomiting    Hallucination   Metoprolol Shortness Of Breath     Muscle Pain     Trazodone Other (See Comments)    headaches    Medications Reviewed Today   Medications were not reviewed in this encounter     Patient Active Problem List   Diagnosis Date Noted   Esophageal dysphagia 12/05/2022   Acute respiratory failure with hypoxia and hypercarbia (HCC) 12/05/2022   Injury of phrenic nerve 12/05/2022   Failure to thrive in adult 12/05/2022   Tinnitus, right ear 12/05/2022   Pulmonary hypertension (HCC) 12/05/2022   Anemia 02/27/2022   Acquired thrombophilia (HCC) 07/14/2021   Status post radiofrequency ablation for arrhythmia 01/09/2021   On dronedarone therapy 11/16/2020   Renal stones 11/16/2020   Salivary gland calculi 11/16/2020   Ureteral stone 05/15/2020   Moderate episode of recurrent major depressive disorder (HCC) 12/29/2019   Overweight 12/04/2019   Basal cell carcinoma of face 10/03/2019   Typical atrial flutter (HCC) 08/15/2019   Lipoma of left upper extremity 08/04/2019   Primary insomnia 06/23/2019   Red blood cell antibody positive 05/22/2019  Chronic anticoagulation 06/19/2018   Affective disorder, major 08/22/2017   Allergic rhinitis 08/22/2017   Anxiety, generalized 08/22/2017   T2DM (type 2 diabetes mellitus) (HCC) 08/22/2017   Hypertension associated with diabetes (HCC) 08/22/2017   Hyperlipidemia associated  with type 2 diabetes mellitus (HCC) 08/22/2017   Mobitz type 2 second degree atrioventricular block 02/05/2017   Paroxysmal A-fib (HCC) 05/25/2015   Recurrent genital HSV (herpes simplex virus) infection 03/13/2014   Chronic constipation 02/17/2014   Reflux gastritis 02/17/2014   Adenomatous polyp of descending colon 02/12/2012    Conditions to be addressed/monitored per PCP order:  Anxiety  Care Plan : LCSW Plan of Care  Updates made by Gustavus Bryant, LCSW since 01/03/2023 12:00 AM     Problem: Coping Skills (General Plan of Care)      Goal: Coping Skills Enhanced   Start Date: 12/20/2022  Priority: High  Note:   Priority: High  Timeframe:  Short-Range Goal Priority:  High Start Date:   12/20/22               Expected End Date:  ongoing                     Follow Up Date--01/31/23 at 2 pm  - keep 90 percent of scheduled appointments -consider counseling or psychiatry -consider bumping up your self-care  -consider creating a stronger support network   Why is this important?             Combatting depression may take some time.            If you don't feel better right away, don't give up on your treatment plan.    Current barriers:   Chronic Mental Health needs related to stress and anxiety regarding managing her health and new normal. Patient requires Support, Education, Resources, Referrals, Advocacy, and Care Coordination, in order to meet Unmet Mental Health Needs To Find a Therapist.  Patient will implement clinical interventions discussed today to decrease symptoms of depression and increase knowledge and/or ability of: coping skills. Mental Health Concerns and Social Isolation Patient lacks knowledge of available community counseling agencies and resources.  Clinical Goal(s): verbalize understanding of plan for management of Anxiety, Depression, and Stress and demonstrate a reduction in symptoms. Patient will connect with a provider for ongoing mental health  treatment, increase coping skills, healthy habits, self-management skills, and stress reduction        Clinical Interventions:  Assessed patient's previous and current treatment, coping skills, support system and barriers to care. Patient provided hx. Patient and spouse live together. Patient reports that her spouse is very supportive. Verbalization of feelings encouraged, motivational interviewing employed Emotional support provided, positive coping strategies explored. Establishing healthy boundaries emphasized and healthy self-care education provided Patient was educated on available mental health resources within their area that accept Medicaid and offer counseling and psychiatry. Patient was advised to contact the back of her insurance card for assistance with benefits as well. Patient educated on the difference between therapy and psychiatry per patient request Email sent to patient today with available mental health resources within her area that accept Medicaid and offer the services that she is interested in.  Patient prefers talk therapy ONLY at this time and is agreeable to referral to [facility] for in person therapy with a female counselor as this location is right near her residence.  Emotional support provided. CBT intervention implemented regarding "being mentally fit" by combating negative thinking and replacing it  with uplifting support, hope and positivity. Patient reports significant worsening anxiety impacting her ability to function appropriately and carry out daily task. Assessed social determinant of health barriers LCSW provided education on relaxation techniques such as meditation, deep breathing, massage, grounding exercises or yoga that can activate the body's relaxation response and ease symptoms of stress and anxiety. LCSW ask that when pt is struggling with difficult emotions and racing thoughts that they start this relaxation response process. LCSW provided  extensive education on healthy coping skills for anxiety. SW used active and reflective listening, validated patient's feelings/concerns, and provided emotional support. Patient will work on implementing appropriate self-care habits into their daily routine such as: staying positive, writing a gratitude list, drinking water, staying active around the house, taking their medications as prescribed, combating negative thoughts or emotions and staying connected with their family and friends. Positive reinforcement provided for this decision to work on this.  Motivational Interviewing employed Depression screen reviewed  PHQ2/ PHQ9 completed or reviewed  Mindfulness or Relaxation training provided Active listening / Reflection utilized  Advance Care and HCPOA education provided Emotional Support Provided Problem Solving /Task Center strategies reviewed Provided psychoeducation for mental health needs  Provided brief CBT  Reviewed mental health medications and discussed importance of compliance:  Quality of sleep assessed & Sleep Hygiene techniques promoted  Participation in counseling encouraged  Verbalization of feelings encouraged  Suicidal Ideation/Homicidal Ideation assessed: Patient denies SI/HI  Review resources, discussed options and provided patient information about  Mental Health Resources Inter-disciplinary care team collaboration (see longitudinal plan of care) Update- Patient reports that she has not received a call from [facility] but she is not entirely sure. [facility] LCSW placed joint call to [facility] and was informed that pt is currently on the wait list for in person therapy and should be getting a call within 4-10 weeks to set up her initial therapy appointment and at that time she will be responsible for completing their electronic intake paperwork. Patient physically wrote down [facility]'s number in case she wishes to check on her status on the wait list over the  next few weeks.  Patient Goals/Self-Care Activities: Take medications as prescribed   Attend all scheduled provider appointments Call pharmacy for medication refills 3-7 days in advance of running out of medications Perform all self care activities independently  Perform IADL's (shopping, preparing meals, housekeeping, managing finances) independently Call provider office for new concerns or questions Work with the social worker to address care coordination needs and will continue to work with the clinical team to address health care and disease management related needs call 1-800-273-TALK (toll free, 24 hour hotline) go to [facility] [address] [place] [phone]) if an urgent crisis arises  call 911 if experiencing a Mental Health or Behavioral Health Crisis  Utilize healthy coping skills and supportive resources discussed Contact PCP with any questions or concerns Keep 90 percent of counseling appointments Call your insurance provider for more information about your Enhanced Benefits  Check out counseling resources provided  Begin personal counseling with LCSW, to reduce and manage symptoms of Depression and Stress, until well-established with mental health provider Accept all calls from representative with [facility] in an effort to establish ongoing mental health counseling and supportive services. Incorporate into daily practice - relaxation techniques, deep breathing exercises, and mindfulness meditation strategies. Talk about feelings with friends, family members, spiritual advisor, etc. Contact LCSW directly [phone]), if you have questions, need assistance, or if  additional social work needs are identified between now and our next scheduled telephone outreach call. Call 988 for mental health hotline/crisis line if needed (24/7 available) Try techniques to reduce symptoms of anxiety/negative thinking (deep breathing,  distraction, positive self talk, etc)  - develop a personal safety plan - develop a plan to deal with triggers like holidays, anniversaries - exercise at least 2 to 3 times per week - have a plan for how to handle bad days - journal feelings and what helps to feel better or worse - spend time or talk with others at least 2 to 3 times per week - watch for early signs of feeling worse - begin personal counseling - call and visit an old friend - check out volunteer opportunities - join a support group - laugh; watch a funny movie or comedian - learn and use visualization or guided imagery - perform a random act of kindness - practice relaxation or meditation daily - start or continue a personal journal - practice positive thinking and self-talk -continue with compliance of taking medication  -identify current effective and ineffective coping strategies.  -implement positive self-talk in care to increase self-esteem, confidence and feelings of control.  -consider alternative and complementary therapy approaches such as meditation, mindfulness or yoga.  Call your insurance provider to gain education on benefits if desired Call your primary care doctor if symptoms get worse  -journaling, prayer, worship services, meditation or pastoral counseling.  -increase participation in pleasurable group activities such as hobbies, singing, sports or volunteering).  -consider the use of meditative movement therapy such as tai chi, yoga or qigong.  -start a regular daily exercise program based on tolerance, ability and patient choice to support positive thinking and activity    Follow Up Plan:  The patient has been provided with contact information for the care management team and has been advised to call with any mental health or health related questions or concerns.  The care management team will reach out to the patient again over the next 30 business  days.   If you are experiencing a Mental Health  or Behavioral Health Crisis or need someone to talk to, please call the Suicide and Crisis Lifeline: 988    Patient Goals: Follow up goal      Follow up:  Patient agrees to Care Plan and Follow-up.  Plan: The Managed Medicaid care management team will reach out to the patient again over the next 30 days.  Dickie La, BSW, MSW, [employer] Managed Medicaid LCSW [facility]  [facility] [email or web address] Phone: [phone]

## 2023-01-04 ENCOUNTER — Other Ambulatory Visit
Admission: RE | Admit: 2023-01-04 | Discharge: 2023-01-04 | Disposition: A | Discharge status: Home / Self Care | Payer: Medicaid Other | Source: Ambulatory Visit | Attending: Cardiology | Admitting: Cardiology

## 2023-01-04 ENCOUNTER — Ambulatory Visit: Payer: Medicaid Other | Admitting: Cardiology

## 2023-01-04 VITALS — BP 130/51 | HR 70 | Wt 143.0 lb

## 2023-01-04 DIAGNOSIS — I5032 Chronic diastolic (congestive) heart failure: Secondary | ICD-10-CM

## 2023-01-04 LAB — BASIC METABOLIC PANEL
Anion gap: 9 (ref 5–15)
BUN: 20 mg/dL (ref 8–23)
CO2: 40 mmol/L — ABNORMAL HIGH (ref 22–32)
Calcium: 9.2 mg/dL (ref 8.9–10.3)
Chloride: 87 mmol/L — ABNORMAL LOW (ref 98–111)
Creatinine, Ser: 0.4 mg/dL — ABNORMAL LOW (ref 0.44–1.00)
GFR, Estimated: >60 mL/min (ref >60)
Glucose, Bld: 136 mg/dL — ABNORMAL HIGH (ref 70–99)
Potassium: 3.4 mmol/L — ABNORMAL LOW (ref 3.5–5.1)
Sodium: 136 mmol/L (ref 135–145)

## 2023-01-04 LAB — BRAIN NATRIURETIC PEPTIDE: B Natriuretic Peptide: 188.7 pg/mL — ABNORMAL HIGH (ref 0.0–100.0)

## 2023-01-04 MED ORDER — POTASSIUM CHLORIDE 20 MEQ PO PACK: PACK | ORAL | 3 refills | Status: DC

## 2023-01-04 MED ORDER — TORSEMIDE 40 MG PO TABS: ORAL_TABLET | ORAL | 3 refills | Status: DC

## 2023-01-04 NOTE — Progress Notes (Signed)
PCP: [name], MD EP: Dr Maisie Fus ([facility]) HF Cardiology: Dr. [name]  62 y.o. with history of permanent atrial fibrillation, diastolic CHF, and diabetes was referred for CHF MD evaluation by [name]. Patient has a long history of atrial fibrillation.  She has failed multiple anti-arrhythmic agents (flecainide, amiodarone, dronedarone, and propafenone).  She had an initial ablation in 5/21, then a redo ablation in 8/22.  At the time of 8/22 ablation, foci of atrial tachycardia were noted that were not ablated due to proximity to the AV node.  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  She had ongoing atrial arrhythmia episodes and underwent convergent endocardial/epicardial ablation at [facility] in 9/23 with left atrial appendage clipping.  This was complicated by ileus and then left hemothorax.  She developed a recurrent left pleural effusion and ended up with left VATS in 10/23.  Atrial fibrillation recurred, and ultimately, she had AV nodal ablation with [facility] [employer] DDD pacemaker with left bundle lead placed.    Since her surgery, she has had chronic pain radiating from left lower chest to upper back. Primarily located at the site where she had a chest tube at time of hemothorax.  Pain is pleuritic.  She was started on colchicine which initially helped but eventually stopped helping and she has stopped the medication.    She has been chronically short of breath since her surgery in 9/23.  Hemodynamic RHC/LHC in 4/24 showed no evidence for ventricular interdependence.  This was not suggestive of constrictive pericarditis.  Echo in 4/24 showed EF 50-55%, moderate RV dysfunction, moderate RV enlargement, PASP 46 mmHg, mild-moderate TR; no evidence for constrictive pericarditis.   She has had dysphagia and has been diagnosed by GI with esophageal dysmotility. She tried a medication for esophageal dysmotility but it caused GI upset so she stopped it.   She was  admitted at [facility] in 8/24 with weight loss and dysphagia.  She was found to have abnormal esophageal manometry and had pharyngeal muscle weakness with aspiration.  She also had diaphragmatic fluoroscopy showing abnormally decreased excursion, worse on left. No achalasia.  She had a G tube placed and is getting tube feeds at home now.  She was in the hospital for about 3 wks.   CT chest in 9/24 showed right pleural effusion with bibasilar atelectasis.   She returns for followup of CHF.  She continues to get tube feeds due to dysphagia and aspiration.  Weight up 3 lbs. [name] using Bipap at night and oxygen 2L by [place] during the day. [name] short of breath if she tries to walk without oxygen.  She reports orthopnea, sleeps propped up.  No chest pain.  She gets lightheaded if her oxygen saturation drops.    Labs (3/24): K 4.1, creatinine 0.55, BNP 299 Labs (4/24): K 3.5, creatinine 0.65, BNP 339 Labs (7/24): K 3.7, creatinine 0.55, LFTs normal except for alkaline phosphatase 191, BNP 128, hgb 14.9 Labs (9/24): K 3.2, creatinine 0.3  PMH: 1. Atrial fibrillation/atrial flutter:  Now permanent. Tried and failed flecainide, amiodarone, dronedarone, and propafenone.  - AF ablation 5/21.   - AF ablation 8/22.  - Convergent epicardial/endocardial ablation with LA appendage clipping in 9/23.  Complicated by left hemothorax and then recurrent left pleural effusion, had left VATS in 10/23.  - AV nodal ablation with [employer] DDD pacemaker with left bundle lead placed 2/24.  2. Atrial tachycardia: Noted at time of AF ablation in 8/22.  Not ablated, near AV node.  3. 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  4. Chronic diastolic CHF: Cardiac MRI in 2/21 showed EF 61%, normal RV, no LGE.  - Cardiolite (3/22): No ischemia/infarction.  - Echo (10/23) with EF 50%, normal RV, moderate TR.  - Echo (4/24): EF 50-55%, moderate RV dysfunction, moderate RV enlargement, PASP 46 mmHg, mild-moderate TR; no  evidence for constrictive pericarditis.  - Hemodynamic LHC/RHC (4/24): mean RA 6, PA 54/27 mean 31, mean PCWP 9, CI 3.51, PVR 3.5 WU; no ventricular interdependence noted.  5. HTN 6. Type 2 diabetes 7. Depression 8. Hyperlipidemia 9. OSA: Mild-moderate 10. Left hemothorax: Complication of convergent procedure.  Developed recurrent pleural effusions, had left VATS in 10/23.   11. Esophageal dysmotility 12. Reduced diaphragmatic excursion: Noted on diaphragmatic fluoroscopy at [facility] in 8/24. 13. Oropharyngeal muscle weakness with aspiration: Has G tube.   Social History   Socioeconomic History   Marital status: Divorced    Spouse name: Not on file   Number of children: 3   Years of education: 12   Highest education level: Some college, no degree  Occupational History    Employer: [employer]    Comment: in special chemistry  Tobacco Use   Smoking status: Former    Types: Cigarettes   Smokeless tobacco: Never  Vaping Use   Vaping status: Never Used  Substance and Sexual Activity   Alcohol use: Not Currently    Alcohol/week: 4.0 standard drinks of alcohol    Types: 2 Glasses of wine, 2 Cans of beer per week   Drug use: No   Sexual activity: Yes    Partners: Male    Birth control/protection: Post-menopausal, Surgical  Other Topics Concern   Not on file  Social History Narrative   Not on file   Social Determinants of Health   Financial Resource Strain: Low Risk  (12/01/2022)   Received from [facility]   Overall Financial Resource Strain (CARDIA)    Difficulty of Paying Living Expenses: Not very hard  Recent Concern: Financial Resource Strain - Medium Risk (12/01/2022)   Overall Financial Resource Strain (CARDIA)    Difficulty of Paying Living Expenses: Somewhat hard  Food Insecurity: No Food Insecurity (12/14/2022)   Hunger Vital Sign    Worried About Running Out of Food in the Last Year: Never true    Ran Out of Food in the Last Year: Never true  Recent  Concern: Food Insecurity - Food Insecurity Present (10/02/2022)   Hunger Vital Sign    Worried About Running Out of Food in the Last Year: Sometimes true    Ran Out of Food in the Last Year: Never true  Transportation Needs: No Transportation Needs (12/14/2022)   PRAPARE - Administrator, Civil Service (Medical): No    Lack of Transportation (Non-Medical): No  Physical Activity: Inactive (12/06/2022)   Exercise Vital Sign    Days of Exercise per Week: 0 days    Minutes of Exercise per Session: 0 min  Stress: Stress Concern Present (10/02/2022)   [employer] of Occupational Health - Occupational Stress Questionnaire    Feeling of Stress : To some extent  Social Connections: Socially Isolated (10/02/2022)   Social Connection and Isolation Panel [NHANES]    Frequency of Communication with Friends and Family: More than three times a week    Frequency of Social Gatherings with Friends and Family: Once a week    Attends Religious Services: Never  Active Member of Clubs or Organizations: No    Attends Banker Meetings: Not on file    Marital Status: Divorced  Intimate Partner Violence: Not At Risk (12/14/2022)   Humiliation, Afraid, Rape, and Kick questionnaire    Fear of Current or Ex-Partner: No    Emotionally Abused: No    Physically Abused: No    Sexually Abused: No   Family History  Problem Relation Age of Onset   Diabetes Mother    Hypertension Mother    Skin cancer Mother    Depression Mother    Alzheimer's disease Father    Diabetes Sister    Depression Sister    Diabetes Brother    Colon cancer Maternal Uncle    Hypertension Maternal Grandfather    Hyperlipidemia Maternal Grandfather    Diabetes Paternal Grandmother    Heart disease Paternal Grandmother    Prostate cancer Paternal Grandfather    Ovarian cancer Cousin    Breast cancer Neg Hx    Cervical cancer Neg Hx    ROS: All systems reviewed and negative except as per HPI.   Current  Outpatient Medications  Medication Sig Dispense Refill   acetaminophen (TYLENOL) 160 MG/5ML liquid Take 500 mg by mouth every 4 (four) hours as needed for fever or pain.     apixaban (ELIQUIS) 5 MG TABS tablet Take 1 tablet (5 mg total) by mouth 2 (two) times daily. 60 tablet 6   Furosemide (FUROSCIX) 80 MG/10ML CTKT Inject 1 Cartridge into the skin as directed.     linaclotide (LINZESS) 72 MCG capsule Take 1 capsule (72 mcg total) by mouth daily as needed. 30 capsule 5   melatonin 5 MG TABS Take 5 mg by mouth at bedtime.     metoCLOPramide (REGLAN) 5 MG/5ML solution Place 2.5 mLs (2.5 mg total) into feeding tube 2 (two) times daily. 120 mL 5   Multiple Vitamins-Minerals (TROPICAL LIQUID NUTRITION) LIQD Take 15 mLs by G tube once daily     omeprazole (PRILOSEC) 20 MG capsule Take 20 mg by mouth 2 (two) times daily before a meal.     polyethylene glycol powder (GLYCOLAX/MIRALAX) 17 GM/SCOOP powder Take 17 g by mouth 2 (two) times daily as needed. 850 g 1   rosuvastatin (CRESTOR) 5 MG tablet Take 1 tablet (5 mg total) by mouth every other day. 45 tablet 1   spironolactone (ALDACTONE) 25 MG tablet Take 1 tablet (25 mg total) by mouth daily. 90 tablet 3   venlafaxine (EFFEXOR) 75 MG tablet Take 1 tablet (75 mg total) by mouth 2 (two) times daily with a meal. 180 tablet 1   docusate (COLACE) 50 MG/5ML liquid Take 10 mLs (100 mg total) by mouth 2 (two) times daily. (Patient not taking: Reported on 01/02/2023) 100 mL 5   omeprazole (KONVOMEP) 2 mg/mL SUSP oral suspension Take 10 mLs (20 mg total) by mouth 2 (two) times daily. (Patient not taking: Reported on 01/02/2023) 900 mL 5   potassium chloride (KLOR-CON) 20 MEQ packet Take 40 mEq by mouth in the morning AND 20 mEq every evening. 30 each 3   Torsemide 40 MG TABS Take 40 mg by mouth in the morning AND 20 mg every evening. 135 tablet 3   No current facility-administered medications for this visit.   BP (!) 130/51   Pulse 70   Wt 143 lb (64.9 kg)    LMP 04/13/2011   SpO2 95%   BMI 21.74 kg/m  General: NAD, thin Neck: JVP  10-12 cm, no thyromegaly or thyroid nodule.  Lungs: Clear to auscultation bilaterally with normal respiratory effort. CV: Nondisplaced PMI.  Heart irregular S1/S2, no S3/S4, 2/6 HSM LLSB. 1+ ankle edema.  No carotid bruit.  Normal pedal pulses.  Abdomen: Soft, nontender, no hepatosplenomegaly, no distention.  Skin: Intact without lesions or rashes.  Neurologic: Alert and oriented x 3.  Psych: Normal affect. Extremities: No clubbing or cyanosis.  HEENT: Normal.   Assessment/Plan: 1. Chronic diastolic CHF: Primarily RV failure.  Echo (10/23) with EF 50%, normal RV, moderate TR.  She has had pericardial-type symptoms with pleuritic chest pain on and off since 9/23 convergent procedure.  She had a left-sided VATS because of persistent pleural effusion. I have been concerned that she could have constrictive pericarditis post-cardiac surgery. However, echo in 4/24 showed EF 50-55%, moderate RV dysfunction, moderate RV enlargement, PASP 46 mmHg, mild-moderate TR; no evidence for constrictive pericarditis.  Hemodynamic LHC/RHC showed normal filling pressures with mild-moderate pulmonary hypertension and no evidence for ventricular interdependence.  So far, imaging has not been consistent with constrictive pericarditis.  Situation looks more like RV failure.  She remains symptomatic, NYHA class III.  I think a significant part of current dyspnea may be lung related (recurrent pleural effusions, s/p VATS, oropharyngeal muscle weakness with aspiration, reduced diaphragmatic excursion).  She does appear volume overloaded on exam today.  - I wanted to get a cardiac MRI for better evaluation of the RV/reassessment of constrictive pericarditis, but she does not think she could tolerate an MRI (severe claustrophobia).  I will arrange for repeat echo instead to reassess RV.  - Increase torsemide to 40 qam/20 qpm and KCl to 40 qam/20 qpm.   BMET/BNP today, BMET in 10 days.   - Continue spironolactone 25 mg daily.  - She was not able to tolerate [place]. She does not remember why, notes reference UTIs.   2. Chronic hypoxemic respiratory failure: Also with OSA.  She has probable chronic aspiration, decreased diaphragmatic excursion, recurrent pleural effusions s/p VATS.  [name] on Bipap at night and 2 L oxygen at all times.  - Follows with pulmonology.  3. Atrial arrhythmias: Permanent AF.  She has history of atrial fibrillation, atrial flutter, atrial tachycardia, and AVNRT. Now with permanent atrial fibrillation despite 2 endocardial ablations and then a convergent endocardial/epicardial ablation.  She has failed multiple anti-arrhythmics as noted above. Now s/p AV nodal ablation with [facility] pacemaker with left bundle lead.   - Continue apixaban.  4. Pulmonary hypertension: Mild-moderate with PVR 3.5 WU on 4/24 RHC.  Suspect group 2 and group 3 (chronic hypoxemic respiratory failure and OSA).  Has significant RV failure.  - Continue home oxygen and Bipap.   - Do not think pulmonary vasodilators would be helpful. 5. Dysphagia: History of aspiration with pharyngeal muscle weakness.   - She now has a G tube and is getting tube feeds.  - Follows with GI.  6. H/o recurrent left pleural effusion: Initially with left hemothorax after convergent procedure, then developed recurrent pleural effusions and had VATS. 7. Left lateral chest pain: I think that this is neuropathic post-chest tube placement.  Pain originates in intercostal space where she had a chest tube.    Followup 1 month with APP.   Marca Ancona 01/04/2023

## 2023-01-04 NOTE — Patient Instructions (Addendum)
Medication Changes:  Increase Torsemide to 40 mg (1 tablet) every morning. And take 20 mg (0.5 tablet) every evening.   Increase potassium powder to 40 mg every morning and 20 mg every evening.   Lab Work:  Labs done today, your results will be available in MyChart, we will contact you for abnormal readings.  You will have labs again in 10 days. Please come to medical mall entrance before 4:30 pm to have your labs completed.   Testing/Procedures:  Your physician has requested that you have an echocardiogram. Echocardiography is a painless test that uses sound waves to create images of your heart. It provides your doctor with information about the size and shape of your heart and how well your heart's chambers and valves are working. This procedure takes approximately one hour. There are no restrictions for this procedure. Please do NOT wear cologne, perfume, aftershave, or lotions (deodorant is allowed). Please arrive 15 minutes prior to your appointment time.   Echo 10/29 @9a    Special Instructions // Education:  Do the following things EVERYDAY: Weigh yourself in the morning before breakfast. Write it down and keep it in a log. Take your medicines as prescribed Eat low salt foods--Limit salt (sodium) to 2000 mg per day.  Stay as active as you can everyday Limit all fluids for the day to less than 2 liters   Follow-Up in: please follow up in 1 months with Inetta Fermo.     If you have any questions or concerns before your next appointment please send [place] a message through [place] or call our office at [phone] Monday-Friday 8 am-5 pm.   If you have an urgent need after hours on the weekend please call your Primary Cardiologist or the [facility] in [place] at [phone].   At the [facility], you and your health needs are our priority. We have a designated team specialized in the treatment of Heart Failure. This Care Team includes your  primary Heart Failure Specialized Cardiologist (physician), Advanced Practice Providers (APPs- Physician Assistants and Nurse Practitioners), and Pharmacist who all work together to provide you with the care you need, when you need it.   You may see any of the following providers on your designated Care Team at your next follow up:  Dr. [name] Dr. Marca Ancona Dr. Dorthula Nettles Dr. Theresia Bough Tonye Becket, NP Robbie Lis, [place] [address] [place], [place] Brynda Peon, NP [place] Lee, NP [name], NP Enos Fling, PharmD

## 2023-01-05 ENCOUNTER — Other Ambulatory Visit: Payer: Medicaid Other

## 2023-01-05 ENCOUNTER — Telehealth: Payer: Self-pay

## 2023-01-05 NOTE — Patient Instructions (Signed)
Visit Information  Thank you for taking time to visit with me today. Please don't hesitate to contact me if I can be of assistance to you before our next scheduled telephone appointment.  Our next and final RN Transition of Care appointment is by telephone on 10/11 at 3pm. Our LCSW Dickie La will continue to follow up with you as scheduled.  Following is a copy of your care plan:   Goals Addressed             This Visit's Progress    Transition of Care       Current Barriers:  Knowledge Deficits related to plan of care for management of diagnosed "Esophageal Dysmotility" with resultant Dysphagia and need for PEG tube placement for nutritional intake   RNCM Clinical Goal(s):  Patient will verbalize understanding of plan for management of diagnosed "Esophageal Dysmotility" resulting in dysphagia as evidenced by stated understanding for needed enteral feedings via PEG tube multiple times per day, need for aspiration precautions, need for Dietitian input and monitoring of Total Protein and Albumin/Gobulin ratio (A/G) labs, need to weigh self daily to ensure no further loss of weight as well as to discern an unintentional gain of 2+ pounds over 24 hours most likely indicating an increase in unwanted fluid volume and need to call Cardiologist regarding possible exacerbation of heart failure/fluid overload not experience hospital admission as evidenced by review of EMR. Hospital Admissions in last 6 months = [facility], 11/03/22 thru 11/30/22  through collaboration with RN Care manager, provider, and care team.   Interventions: 1:1 collaboration with primary care provider regarding development and update of comprehensive plan of care as evidenced by provider attestation and co-signature Inter-disciplinary care team collaboration (see longitudinal plan of care) Evaluation of current treatment plan related to  self management and patient's adherence to plan as established by provider   Management of  Dysphagia   (Status:  On Track)  Short Term Goal Evaluation of current treatment plan related to  Esophageal Dysmotility and resultant Dysphagia requiring PEG tube feedings , Level of care concerns self-management and patient's adherence to plan as established by provider. Discussed plans with patient for ongoing care management follow up and provided patient with direct contact information for care management team Evaluation of current treatment plan related to diagnosed Esophageal Dysmotility and patient's adherence to plan as established by provider Reviewed scheduled/upcoming provider appointments including PCP, [facility],  Cardiologist, and continued support from [facility] with RN, PT/OT/ST Reviewed upcoming scheduled appointment with LCSW Dickie La to discuss positive coping strategies regarding living with chronic health conditions  Patient Goals/Self-Care Activities: Take all medications as prescribed Attend all scheduled provider appointments Call pharmacy for medication refills 3-7 days in advance of running out of medications Call provider office for new concerns or questions  call office if I gain more than 2 pounds in one day or 5 pounds in one week meet with dietitian track weight in diary weigh myself daily bring diary to all appointments track symptoms and what helps feel better or worse Agreed to speak to LCSW Dickie La re coping with life-altering chronic health conditions. First appointment was on 01/03/23 @3pm , next appointment on 10/30 @2pm   Follow Up Plan:  Extended one more week for Telephone follow up appointment with care management team member scheduled for:  October 11, at 3pm          Patient verbalizes understanding of instructions and care plan provided today and agrees to  view in MyChart. Active MyChart status and patient understanding of how to access instructions and care plan via MyChart confirmed with patient.     The  patient has been provided with contact information for the care management team and has been advised to call with any health related questions or concerns.   Please call the care guide team at [phone] if you need to cancel or reschedule your appointment.   Please call 1-800-273-TALK (toll free, 24 hour hotline) if you are experiencing a Mental Health or Behavioral Health Crisis or need someone to talk to.  [name], RN, BA, [facility], CRRN [facility] Coordinator, Transition of Care Ph # [phone]

## 2023-01-05 NOTE — Patient Outreach (Signed)
Care Management  Transitions of Care Program Managed Medicaid Transitions of Care (Extended)week 5   01/05/2023 Name: Julia Stark MRN: [id number] DOB: 16-Jul-1960  Subjective: Julia Stark is a 62 y.o. year old female who is a primary care patient of Bacigalupo, Marzella Schlein, MD. The Care Management team Engaged with patient Engaged with patient by telephone to assess and address transitions of care needs.   Consent to Services:  Patient was given information about Managed Medicaid Care Management services, agreed to services, and gave verbal consent to participate.   Assessment:           SDOH Interventions    Flowsheet Row Telephone from 12/14/2022 in [place] POPULATION HEALTH DEPARTMENT Telephone from 12/06/2022 in [place] POPULATION HEALTH DEPARTMENT Office Visit from 12/05/2022 in [facility] Office Visit from 02/27/2022 in [facility] Office Visit from 08/02/2021 in [facility] Office Visit from 01/11/2021 in [facility]  SDOH Interventions        Food Insecurity Interventions Intervention Not Indicated -- -- -- -- --  Housing Interventions Intervention Not Indicated -- -- -- -- --  Transportation Interventions Intervention Not Indicated -- -- -- -- --  Utilities Interventions Intervention Not Indicated -- -- -- -- --  Depression Interventions/Treatment  --  [referred patient to LCSW for counseling/assistance with coping with life-altering chronic illness] -- Currently on Treatment Currently on Treatment PHQ2-9 Score <4 Follow-up Not Indicated Currently on Treatment  Physical Activity Interventions -- Other (Comments)  [HHS PT 2Xs per week] -- -- -- --  Health Literacy Interventions Intervention Not Indicated -- -- -- -- --        Goals Addressed             This Visit's Progress    Transition of Care       Current Barriers:  Knowledge Deficits related to  plan of care for management of diagnosed "Esophageal Dysmotility" with resultant Dysphagia and need for PEG tube placement for nutritional intake   RNCM Clinical Goal(s):  Patient will verbalize understanding of plan for management of diagnosed "Esophageal Dysmotility" resulting in dysphagia as evidenced by stated understanding for needed enteral feedings via PEG tube multiple times per day, need for aspiration precautions, need for Dietitian input and monitoring of Total Protein and Albumin/Gobulin ratio (A/G) labs, need to weigh self daily to ensure no further loss of weight as well as to discern an unintentional gain of 2+ pounds over 24 hours most likely indicating an increase in unwanted fluid volume and need to call Cardiologist regarding possible exacerbation of heart failure/fluid overload not experience hospital admission as evidenced by review of EMR. Hospital Admissions in last 6 months = [facility], 11/03/22 thru 11/30/22  through collaboration with RN Care manager, provider, and care team.   Interventions: 1:1 collaboration with primary care provider regarding development and update of comprehensive plan of care as evidenced by provider attestation and co-signature Inter-disciplinary care team collaboration (see longitudinal plan of care) Evaluation of current treatment plan related to  self management and patient's adherence to plan as established by provider   Management of Dysphagia   (Status:  On Track)  Short Term Goal Evaluation of current treatment plan related to  Esophageal Dysmotility and resultant Dysphagia requiring PEG tube feedings , Level of care concerns self-management and patient's adherence to plan as established by provider. Discussed plans with patient for ongoing care management follow up and provided patient  with direct contact information for care management team Evaluation of current treatment plan related to diagnosed Esophageal Dysmotility and patient's adherence to plan  as established by provider Reviewed scheduled/upcoming provider appointments including PCP, [place] [facility], [place] Cardiologist, and continued support from [facility] with RN, PT/OT/ST Reviewed upcoming scheduled appointment with LCSW Dickie La to discuss positive coping strategies regarding living with chronic health conditions  Patient Goals/Self-Care Activities: Take all medications as prescribed Attend all scheduled provider appointments Call pharmacy for medication refills 3-7 days in advance of running out of medications Call provider office for new concerns or questions  call office if I gain more than 2 pounds in one day or 5 pounds in one week meet with dietitian track weight in diary weigh myself daily bring diary to all appointments track symptoms and what helps feel better or worse Agreed to speak to LCSW Dickie La re coping with life-altering chronic health conditions. First appointment was on 01/03/23 @3pm , next appointment on 10/30 @2pm   Follow Up Plan:  Extended one more week for Telephone follow up appointment with care management team member scheduled for:  October 11, at 3pm          Plan: The patient has been provided with contact information for the care management team and has been advised to call with any health related questions or concerns.   [name], RN, BA, [facility], CRRN [facility] Coordinator, Transition of Care Ph # [phone]

## 2023-01-08 DIAGNOSIS — R112 Nausea with vomiting, unspecified: Secondary | ICD-10-CM | POA: Diagnosis not present

## 2023-01-08 DIAGNOSIS — Z950 Presence of cardiac pacemaker: Secondary | ICD-10-CM | POA: Diagnosis not present

## 2023-01-08 DIAGNOSIS — I272 Pulmonary hypertension, unspecified: Secondary | ICD-10-CM | POA: Diagnosis not present

## 2023-01-08 DIAGNOSIS — Z87891 Personal history of nicotine dependence: Secondary | ICD-10-CM | POA: Diagnosis not present

## 2023-01-08 DIAGNOSIS — I441 Atrioventricular block, second degree: Secondary | ICD-10-CM | POA: Diagnosis not present

## 2023-01-08 DIAGNOSIS — E873 Alkalosis: Secondary | ICD-10-CM | POA: Diagnosis not present

## 2023-01-08 DIAGNOSIS — Z9889 Other specified postprocedural states: Secondary | ICD-10-CM | POA: Diagnosis not present

## 2023-01-08 DIAGNOSIS — J9622 Acute and chronic respiratory failure with hypercapnia: Secondary | ICD-10-CM | POA: Diagnosis not present

## 2023-01-08 DIAGNOSIS — J90 Pleural effusion, not elsewhere classified: Secondary | ICD-10-CM | POA: Diagnosis not present

## 2023-01-08 DIAGNOSIS — I4821 Permanent atrial fibrillation: Secondary | ICD-10-CM | POA: Diagnosis not present

## 2023-01-08 DIAGNOSIS — R1314 Dysphagia, pharyngoesophageal phase: Secondary | ICD-10-CM | POA: Diagnosis not present

## 2023-01-08 DIAGNOSIS — R627 Adult failure to thrive: Secondary | ICD-10-CM | POA: Diagnosis not present

## 2023-01-08 DIAGNOSIS — Z431 Encounter for attention to gastrostomy: Secondary | ICD-10-CM | POA: Diagnosis not present

## 2023-01-08 DIAGNOSIS — I110 Hypertensive heart disease with heart failure: Secondary | ICD-10-CM | POA: Diagnosis not present

## 2023-01-08 DIAGNOSIS — E119 Type 2 diabetes mellitus without complications: Secondary | ICD-10-CM | POA: Diagnosis not present

## 2023-01-08 DIAGNOSIS — F331 Major depressive disorder, recurrent, moderate: Secondary | ICD-10-CM | POA: Diagnosis not present

## 2023-01-08 DIAGNOSIS — R519 Headache, unspecified: Secondary | ICD-10-CM | POA: Diagnosis not present

## 2023-01-08 DIAGNOSIS — K5909 Other constipation: Secondary | ICD-10-CM | POA: Diagnosis not present

## 2023-01-08 DIAGNOSIS — Z6821 Body mass index (BMI) 21.0-21.9, adult: Secondary | ICD-10-CM | POA: Diagnosis not present

## 2023-01-08 DIAGNOSIS — E43 Unspecified severe protein-calorie malnutrition: Secondary | ICD-10-CM | POA: Diagnosis not present

## 2023-01-08 DIAGNOSIS — K224 Dyskinesia of esophagus: Secondary | ICD-10-CM | POA: Diagnosis not present

## 2023-01-08 DIAGNOSIS — Z7901 Long term (current) use of anticoagulants: Secondary | ICD-10-CM | POA: Diagnosis not present

## 2023-01-08 DIAGNOSIS — I5032 Chronic diastolic (congestive) heart failure: Secondary | ICD-10-CM | POA: Diagnosis not present

## 2023-01-08 DIAGNOSIS — G4733 Obstructive sleep apnea (adult) (pediatric): Secondary | ICD-10-CM | POA: Diagnosis not present

## 2023-01-08 DIAGNOSIS — Z9981 Dependence on supplemental oxygen: Secondary | ICD-10-CM | POA: Diagnosis not present

## 2023-01-10 DIAGNOSIS — Z9981 Dependence on supplemental oxygen: Secondary | ICD-10-CM | POA: Diagnosis not present

## 2023-01-10 DIAGNOSIS — I441 Atrioventricular block, second degree: Secondary | ICD-10-CM | POA: Diagnosis not present

## 2023-01-10 DIAGNOSIS — I4821 Permanent atrial fibrillation: Secondary | ICD-10-CM | POA: Diagnosis not present

## 2023-01-10 DIAGNOSIS — E873 Alkalosis: Secondary | ICD-10-CM | POA: Diagnosis not present

## 2023-01-10 DIAGNOSIS — Z9889 Other specified postprocedural states: Secondary | ICD-10-CM | POA: Diagnosis not present

## 2023-01-10 DIAGNOSIS — R1314 Dysphagia, pharyngoesophageal phase: Secondary | ICD-10-CM | POA: Diagnosis not present

## 2023-01-10 DIAGNOSIS — Z6821 Body mass index (BMI) 21.0-21.9, adult: Secondary | ICD-10-CM | POA: Diagnosis not present

## 2023-01-10 DIAGNOSIS — Z950 Presence of cardiac pacemaker: Secondary | ICD-10-CM | POA: Diagnosis not present

## 2023-01-10 DIAGNOSIS — I110 Hypertensive heart disease with heart failure: Secondary | ICD-10-CM | POA: Diagnosis not present

## 2023-01-10 DIAGNOSIS — E119 Type 2 diabetes mellitus without complications: Secondary | ICD-10-CM | POA: Diagnosis not present

## 2023-01-10 DIAGNOSIS — R112 Nausea with vomiting, unspecified: Secondary | ICD-10-CM | POA: Diagnosis not present

## 2023-01-10 DIAGNOSIS — E43 Unspecified severe protein-calorie malnutrition: Secondary | ICD-10-CM | POA: Diagnosis not present

## 2023-01-10 DIAGNOSIS — R627 Adult failure to thrive: Secondary | ICD-10-CM | POA: Diagnosis not present

## 2023-01-10 DIAGNOSIS — I5032 Chronic diastolic (congestive) heart failure: Secondary | ICD-10-CM | POA: Diagnosis not present

## 2023-01-10 DIAGNOSIS — R519 Headache, unspecified: Secondary | ICD-10-CM | POA: Diagnosis not present

## 2023-01-10 DIAGNOSIS — I272 Pulmonary hypertension, unspecified: Secondary | ICD-10-CM | POA: Diagnosis not present

## 2023-01-10 DIAGNOSIS — Z7901 Long term (current) use of anticoagulants: Secondary | ICD-10-CM | POA: Diagnosis not present

## 2023-01-10 DIAGNOSIS — J90 Pleural effusion, not elsewhere classified: Secondary | ICD-10-CM | POA: Diagnosis not present

## 2023-01-10 DIAGNOSIS — G4733 Obstructive sleep apnea (adult) (pediatric): Secondary | ICD-10-CM | POA: Diagnosis not present

## 2023-01-10 DIAGNOSIS — K224 Dyskinesia of esophagus: Secondary | ICD-10-CM | POA: Diagnosis not present

## 2023-01-10 DIAGNOSIS — K5909 Other constipation: Secondary | ICD-10-CM | POA: Diagnosis not present

## 2023-01-10 DIAGNOSIS — F331 Major depressive disorder, recurrent, moderate: Secondary | ICD-10-CM | POA: Diagnosis not present

## 2023-01-10 DIAGNOSIS — Z431 Encounter for attention to gastrostomy: Secondary | ICD-10-CM | POA: Diagnosis not present

## 2023-01-10 DIAGNOSIS — Z87891 Personal history of nicotine dependence: Secondary | ICD-10-CM | POA: Diagnosis not present

## 2023-01-10 DIAGNOSIS — J9622 Acute and chronic respiratory failure with hypercapnia: Secondary | ICD-10-CM | POA: Diagnosis not present

## 2023-01-11 DIAGNOSIS — E873 Alkalosis: Secondary | ICD-10-CM | POA: Diagnosis not present

## 2023-01-11 DIAGNOSIS — Z9981 Dependence on supplemental oxygen: Secondary | ICD-10-CM | POA: Diagnosis not present

## 2023-01-11 DIAGNOSIS — Z87891 Personal history of nicotine dependence: Secondary | ICD-10-CM | POA: Diagnosis not present

## 2023-01-11 DIAGNOSIS — G4733 Obstructive sleep apnea (adult) (pediatric): Secondary | ICD-10-CM | POA: Diagnosis not present

## 2023-01-11 DIAGNOSIS — E119 Type 2 diabetes mellitus without complications: Secondary | ICD-10-CM | POA: Diagnosis not present

## 2023-01-11 DIAGNOSIS — Z9889 Other specified postprocedural states: Secondary | ICD-10-CM | POA: Diagnosis not present

## 2023-01-11 DIAGNOSIS — Z6821 Body mass index (BMI) 21.0-21.9, adult: Secondary | ICD-10-CM | POA: Diagnosis not present

## 2023-01-11 DIAGNOSIS — I4821 Permanent atrial fibrillation: Secondary | ICD-10-CM | POA: Diagnosis not present

## 2023-01-11 DIAGNOSIS — Z950 Presence of cardiac pacemaker: Secondary | ICD-10-CM | POA: Diagnosis not present

## 2023-01-11 DIAGNOSIS — R1314 Dysphagia, pharyngoesophageal phase: Secondary | ICD-10-CM | POA: Diagnosis not present

## 2023-01-11 DIAGNOSIS — Z431 Encounter for attention to gastrostomy: Secondary | ICD-10-CM | POA: Diagnosis not present

## 2023-01-11 DIAGNOSIS — I272 Pulmonary hypertension, unspecified: Secondary | ICD-10-CM | POA: Diagnosis not present

## 2023-01-11 DIAGNOSIS — I110 Hypertensive heart disease with heart failure: Secondary | ICD-10-CM | POA: Diagnosis not present

## 2023-01-11 DIAGNOSIS — K5909 Other constipation: Secondary | ICD-10-CM | POA: Diagnosis not present

## 2023-01-11 DIAGNOSIS — Z7901 Long term (current) use of anticoagulants: Secondary | ICD-10-CM | POA: Diagnosis not present

## 2023-01-11 DIAGNOSIS — J90 Pleural effusion, not elsewhere classified: Secondary | ICD-10-CM | POA: Diagnosis not present

## 2023-01-11 DIAGNOSIS — F331 Major depressive disorder, recurrent, moderate: Secondary | ICD-10-CM | POA: Diagnosis not present

## 2023-01-11 DIAGNOSIS — E43 Unspecified severe protein-calorie malnutrition: Secondary | ICD-10-CM | POA: Diagnosis not present

## 2023-01-11 DIAGNOSIS — R519 Headache, unspecified: Secondary | ICD-10-CM | POA: Diagnosis not present

## 2023-01-11 DIAGNOSIS — I5032 Chronic diastolic (congestive) heart failure: Secondary | ICD-10-CM | POA: Diagnosis not present

## 2023-01-11 DIAGNOSIS — R112 Nausea with vomiting, unspecified: Secondary | ICD-10-CM | POA: Diagnosis not present

## 2023-01-11 DIAGNOSIS — K224 Dyskinesia of esophagus: Secondary | ICD-10-CM | POA: Diagnosis not present

## 2023-01-11 DIAGNOSIS — I441 Atrioventricular block, second degree: Secondary | ICD-10-CM | POA: Diagnosis not present

## 2023-01-11 DIAGNOSIS — R627 Adult failure to thrive: Secondary | ICD-10-CM | POA: Diagnosis not present

## 2023-01-11 DIAGNOSIS — J9622 Acute and chronic respiratory failure with hypercapnia: Secondary | ICD-10-CM | POA: Diagnosis not present

## 2023-01-12 ENCOUNTER — Other Ambulatory Visit: Payer: Medicaid Other

## 2023-01-12 ENCOUNTER — Telehealth: Payer: Self-pay

## 2023-01-14 NOTE — Patient Instructions (Signed)
Visit Information  Thank you for taking time to visit with me today. It has been my pleasure to work with you over the past month during your transition to home care from being hospitalized with a restrictive lung injury resulting in the need for continuous oxygen 24/7, and BIPAP for sleep, as well as PEG tube placement for esophageal dysmotility.  You have done an amazing job managing your care at home with ancillary services, and hopefully, in the not-too-distant future you will be able to have the PEG tube removed.  Dickie La LCSW will continue to follow up with you and, per your permission, I am referring you to Estanislado Emms RN for continued case management.  Best wishes and Warm regards,  [name]  Following is a copy of your care plan:   Goals Addressed             This Visit's Progress    Transition of Care       Current Barriers:  Knowledge Deficits related to plan of care for management of diagnosed "Esophageal Dysmotility" with resultant Dysphagia and need for PEG tube placement for nutritional intake   RNCM Clinical Goal(s):  Patient will verbalize understanding of plan for management of diagnosed "Esophageal Dysmotility" resulting in dysphagia as evidenced by stated understanding for needed enteral feedings via PEG tube multiple times per day, need for aspiration precautions, need for Dietitian input and monitoring of Total Protein and Albumin/Gobulin ratio (A/G) labs, need to weigh self daily to ensure no further loss of weight as well as to discern an unintentional gain of 2+ pounds over 24 hours most likely indicating an increase in unwanted fluid volume and need to call Cardiologist regarding possible exacerbation of heart failure/fluid overload not experience hospital admission as evidenced by review of EMR. Hospital Admissions in last 6 months = [facility], 11/03/22 thru 11/30/22  through collaboration with RN Care manager, provider, and care team.   Interventions: 1:1  collaboration with primary care provider regarding development and update of comprehensive plan of care as evidenced by provider attestation and co-signature Inter-disciplinary care team collaboration (see longitudinal plan of care) Evaluation of current treatment plan related to  self management and patient's adherence to plan as established by provider   Management of Dysphagia   (Status:  On Track)  Short Term Goal Evaluation of current treatment plan related to  Esophageal Dysmotility and resultant Dysphagia requiring PEG tube feedings , Level of care concerns self-management and patient's adherence to plan as established by provider. Discussed plans with patient for ongoing care management follow up and provided patient with direct contact information for care management team Evaluation of current treatment plan related to diagnosed Esophageal Dysmotility and patient's adherence to plan as established by provider Reviewed scheduled/upcoming provider appointments including PCP, [place]  [facility], [place] Cardiologist, and continued support from [facility] with RN, PT/OT/ST Reviewed upcoming scheduled appointment with LCSW Dickie La to discuss positive coping strategies regarding living with chronic health conditions  Patient Goals/Self-Care Activities: Take all medications as prescribed Attend all scheduled provider appointments Call pharmacy for medication refills 3-7 days in advance of running out of medications Call provider office for new concerns or questions  call office if I gain more than 2 pounds in one day or 5 pounds in one week meet with dietitian track weight in diary weigh myself daily bring diary to all appointments track symptoms and what helps feel better or worse Agreed to speak to LCSW Dickie La re coping  with life-altering chronic health conditions. First appointment was on 01/03/23 @3pm , next appointment on 10/30 @2pm   Follow Up Plan:  Patient  agreed to be followed by Longitudinal CCM RN Estanislado Emms for continued case management connected with her PCP and [facility].        Patient verbalizes understanding of instructions and care plan provided today and agrees to view in MyChart. Active MyChart status and patient understanding of how to access instructions and care plan via MyChart confirmed with patient.     The patient has been provided with contact information for the care management team and has been advised to call with any health related questions or concerns.   Please call the care guide team at [phone] if you need to cancel or reschedule your appointment.   Please call 1-800-273-TALK (toll free, 24 hour hotline) if you are experiencing a Mental Health or Behavioral Health Crisis or need someone to talk to.  [name], RN, BA, [facility] CRRN [facility] Coordinator, Transition of Care Ph # [phone]

## 2023-01-14 NOTE — Patient Outreach (Signed)
Care Management  Transitions of Care Program Managed Medicaid Transitions of Care completion of 30d program   01/14/2023 Name: Julia Stark MRN: [id number] DOB: [birth year]  Subjective: Julia Stark is a 62 y.o. year old female who is a primary care patient of Bacigalupo, Marzella Schlein, MD. The Care Management team Engaged with patient Engaged with patient by telephone to assess and address transitions of care needs.   Consent to Services:  Patient was given information about Managed Medicaid Care Management services, agreed to services, and gave verbal consent to participate.   Assessment:  See attached documentation.  SDOH Interventions    Flowsheet Row Telephone from 12/14/2022 in [place] POPULATION HEALTH DEPARTMENT Telephone from 12/06/2022 in [place] POPULATION HEALTH DEPARTMENT Office Visit from 12/05/2022 in [facility] Practice Office Visit from 02/27/2022 in [facility] Office Visit from 08/02/2021 in [facility] Office Visit from 01/11/2021 in [facility] Practice  SDOH Interventions        Food Insecurity Interventions Intervention Not Indicated -- -- -- -- --  Housing Interventions Intervention Not Indicated -- -- -- -- --  Transportation Interventions Intervention Not Indicated -- -- -- -- --  Utilities Interventions Intervention Not Indicated -- -- -- -- --  Depression Interventions/Treatment  --  [referred patient to LCSW for counseling/assistance with coping with life-altering chronic illness] -- Currently on Treatment Currently on Treatment PHQ2-9 Score <4 Follow-up Not Indicated Currently on Treatment  Physical Activity Interventions -- Other (Comments)  [HHS PT 2Xs per week] -- -- -- --  Health Literacy Interventions Intervention Not Indicated -- -- -- -- --        Goals Addressed             This Visit's Progress    Transition of Care       Current Barriers:   Knowledge Deficits related to plan of care for management of diagnosed "Esophageal Dysmotility" with resultant Dysphagia and need for PEG tube placement for nutritional intake   RNCM Clinical Goal(s):  Patient will verbalize understanding of plan for management of diagnosed "Esophageal Dysmotility" resulting in dysphagia as evidenced by stated understanding for needed enteral feedings via PEG tube multiple times per day, need for aspiration precautions, need for Dietitian input and monitoring of Total Protein and Albumin/Gobulin ratio (A/G) labs, need to weigh self daily to ensure no further loss of weight as well as to discern an unintentional gain of 2+ pounds over 24 hours most likely indicating an increase in unwanted fluid volume and need to call Cardiologist regarding possible exacerbation of heart failure/fluid overload not experience hospital admission as evidenced by review of EMR. Hospital Admissions in last 6 months = [facility], 11/03/22 thru 11/30/22  through collaboration with RN Care manager, provider, and care team.   Interventions: 1:1 collaboration with primary care provider regarding development and update of comprehensive plan of care as evidenced by provider attestation and co-signature Inter-disciplinary care team collaboration (see longitudinal plan of care) Evaluation of current treatment plan related to  self management and patient's adherence to plan as established by provider   Management of Dysphagia   (Status:  On Track)  Short Term Goal Evaluation of current treatment plan related to  Esophageal Dysmotility and resultant Dysphagia requiring PEG tube feedings , Level of care concerns self-management and patient's adherence to plan as established by provider. Discussed plans with patient for ongoing care management follow up and provided patient with direct contact  information for care management team Evaluation of current treatment plan related to diagnosed Esophageal Dysmotility  and patient's adherence to plan as established by provider Reviewed scheduled/upcoming provider appointments including PCP, [place] [facility], [place] Cardiologist, and continued support from [facility] with RN, PT/OT/ST Reviewed upcoming scheduled appointment with LCSW Dickie La to discuss positive coping strategies regarding living with chronic health conditions  Patient Goals/Self-Care Activities: Take all medications as prescribed Attend all scheduled provider appointments Call pharmacy for medication refills 3-7 days in advance of running out of medications Call provider office for new concerns or questions  call office if I gain more than 2 pounds in one day or 5 pounds in one week meet with dietitian track weight in diary weigh myself daily bring diary to all appointments track symptoms and what helps feel better or worse Agreed to speak to LCSW Dickie La re coping with life-altering chronic health conditions. First appointment was on 01/03/23 @3pm , next appointment on 10/30 @2pm   Follow Up Plan:  Patient agreed to be followed by Longitudinal CCM RN Estanislado Emms for continued case management connected with her PCP and [facility].        Plan: The patient has been provided with contact information for the care management team and has been advised to call with any health related questions or concerns.   [name], RN, BA, [facility], CRRN [facility] Coordinator, Transition of Care Ph # [phone]

## 2023-01-15 ENCOUNTER — Encounter: Payer: Self-pay | Admitting: Family

## 2023-01-15 ENCOUNTER — Other Ambulatory Visit: Payer: Self-pay | Admitting: Family

## 2023-01-17 DIAGNOSIS — I110 Hypertensive heart disease with heart failure: Secondary | ICD-10-CM | POA: Diagnosis not present

## 2023-01-17 DIAGNOSIS — Z7901 Long term (current) use of anticoagulants: Secondary | ICD-10-CM | POA: Diagnosis not present

## 2023-01-17 DIAGNOSIS — Z9981 Dependence on supplemental oxygen: Secondary | ICD-10-CM | POA: Diagnosis not present

## 2023-01-17 DIAGNOSIS — E119 Type 2 diabetes mellitus without complications: Secondary | ICD-10-CM | POA: Diagnosis not present

## 2023-01-17 DIAGNOSIS — Z950 Presence of cardiac pacemaker: Secondary | ICD-10-CM | POA: Diagnosis not present

## 2023-01-17 DIAGNOSIS — Z87891 Personal history of nicotine dependence: Secondary | ICD-10-CM | POA: Diagnosis not present

## 2023-01-17 DIAGNOSIS — R519 Headache, unspecified: Secondary | ICD-10-CM | POA: Diagnosis not present

## 2023-01-17 DIAGNOSIS — J90 Pleural effusion, not elsewhere classified: Secondary | ICD-10-CM | POA: Diagnosis not present

## 2023-01-17 DIAGNOSIS — G4733 Obstructive sleep apnea (adult) (pediatric): Secondary | ICD-10-CM | POA: Diagnosis not present

## 2023-01-17 DIAGNOSIS — E873 Alkalosis: Secondary | ICD-10-CM | POA: Diagnosis not present

## 2023-01-17 DIAGNOSIS — K5909 Other constipation: Secondary | ICD-10-CM | POA: Diagnosis not present

## 2023-01-17 DIAGNOSIS — Z9889 Other specified postprocedural states: Secondary | ICD-10-CM | POA: Diagnosis not present

## 2023-01-17 DIAGNOSIS — I441 Atrioventricular block, second degree: Secondary | ICD-10-CM | POA: Diagnosis not present

## 2023-01-17 DIAGNOSIS — F331 Major depressive disorder, recurrent, moderate: Secondary | ICD-10-CM | POA: Diagnosis not present

## 2023-01-17 DIAGNOSIS — R627 Adult failure to thrive: Secondary | ICD-10-CM | POA: Diagnosis not present

## 2023-01-17 DIAGNOSIS — E43 Unspecified severe protein-calorie malnutrition: Secondary | ICD-10-CM | POA: Diagnosis not present

## 2023-01-17 DIAGNOSIS — J9622 Acute and chronic respiratory failure with hypercapnia: Secondary | ICD-10-CM | POA: Diagnosis not present

## 2023-01-17 DIAGNOSIS — K224 Dyskinesia of esophagus: Secondary | ICD-10-CM | POA: Diagnosis not present

## 2023-01-17 DIAGNOSIS — I272 Pulmonary hypertension, unspecified: Secondary | ICD-10-CM | POA: Diagnosis not present

## 2023-01-17 DIAGNOSIS — R112 Nausea with vomiting, unspecified: Secondary | ICD-10-CM | POA: Diagnosis not present

## 2023-01-17 DIAGNOSIS — I5032 Chronic diastolic (congestive) heart failure: Secondary | ICD-10-CM | POA: Diagnosis not present

## 2023-01-17 DIAGNOSIS — I671 Cerebral aneurysm, nonruptured: Secondary | ICD-10-CM | POA: Diagnosis not present

## 2023-01-17 DIAGNOSIS — R1314 Dysphagia, pharyngoesophageal phase: Secondary | ICD-10-CM | POA: Diagnosis not present

## 2023-01-17 DIAGNOSIS — I4821 Permanent atrial fibrillation: Secondary | ICD-10-CM | POA: Diagnosis not present

## 2023-01-17 DIAGNOSIS — Z431 Encounter for attention to gastrostomy: Secondary | ICD-10-CM | POA: Diagnosis not present

## 2023-01-17 DIAGNOSIS — Z6821 Body mass index (BMI) 21.0-21.9, adult: Secondary | ICD-10-CM | POA: Diagnosis not present

## 2023-01-18 DIAGNOSIS — K224 Dyskinesia of esophagus: Secondary | ICD-10-CM | POA: Diagnosis not present

## 2023-01-18 DIAGNOSIS — Z87891 Personal history of nicotine dependence: Secondary | ICD-10-CM | POA: Diagnosis not present

## 2023-01-18 DIAGNOSIS — F331 Major depressive disorder, recurrent, moderate: Secondary | ICD-10-CM | POA: Diagnosis not present

## 2023-01-18 DIAGNOSIS — E873 Alkalosis: Secondary | ICD-10-CM | POA: Diagnosis not present

## 2023-01-18 DIAGNOSIS — G4733 Obstructive sleep apnea (adult) (pediatric): Secondary | ICD-10-CM | POA: Diagnosis not present

## 2023-01-18 DIAGNOSIS — Z9889 Other specified postprocedural states: Secondary | ICD-10-CM | POA: Diagnosis not present

## 2023-01-18 DIAGNOSIS — R519 Headache, unspecified: Secondary | ICD-10-CM | POA: Diagnosis not present

## 2023-01-18 DIAGNOSIS — J90 Pleural effusion, not elsewhere classified: Secondary | ICD-10-CM | POA: Diagnosis not present

## 2023-01-18 DIAGNOSIS — I5032 Chronic diastolic (congestive) heart failure: Secondary | ICD-10-CM | POA: Diagnosis not present

## 2023-01-18 DIAGNOSIS — I441 Atrioventricular block, second degree: Secondary | ICD-10-CM | POA: Diagnosis not present

## 2023-01-18 DIAGNOSIS — Z6821 Body mass index (BMI) 21.0-21.9, adult: Secondary | ICD-10-CM | POA: Diagnosis not present

## 2023-01-18 DIAGNOSIS — I4821 Permanent atrial fibrillation: Secondary | ICD-10-CM | POA: Diagnosis not present

## 2023-01-18 DIAGNOSIS — Z7901 Long term (current) use of anticoagulants: Secondary | ICD-10-CM | POA: Diagnosis not present

## 2023-01-18 DIAGNOSIS — R627 Adult failure to thrive: Secondary | ICD-10-CM | POA: Diagnosis not present

## 2023-01-18 DIAGNOSIS — Z431 Encounter for attention to gastrostomy: Secondary | ICD-10-CM | POA: Diagnosis not present

## 2023-01-18 DIAGNOSIS — I110 Hypertensive heart disease with heart failure: Secondary | ICD-10-CM | POA: Diagnosis not present

## 2023-01-18 DIAGNOSIS — R1314 Dysphagia, pharyngoesophageal phase: Secondary | ICD-10-CM | POA: Diagnosis not present

## 2023-01-18 DIAGNOSIS — E119 Type 2 diabetes mellitus without complications: Secondary | ICD-10-CM | POA: Diagnosis not present

## 2023-01-18 DIAGNOSIS — R112 Nausea with vomiting, unspecified: Secondary | ICD-10-CM | POA: Diagnosis not present

## 2023-01-18 DIAGNOSIS — I272 Pulmonary hypertension, unspecified: Secondary | ICD-10-CM | POA: Diagnosis not present

## 2023-01-18 DIAGNOSIS — K5909 Other constipation: Secondary | ICD-10-CM | POA: Diagnosis not present

## 2023-01-18 DIAGNOSIS — E43 Unspecified severe protein-calorie malnutrition: Secondary | ICD-10-CM | POA: Diagnosis not present

## 2023-01-18 DIAGNOSIS — Z950 Presence of cardiac pacemaker: Secondary | ICD-10-CM | POA: Diagnosis not present

## 2023-01-18 DIAGNOSIS — J9622 Acute and chronic respiratory failure with hypercapnia: Secondary | ICD-10-CM | POA: Diagnosis not present

## 2023-01-18 DIAGNOSIS — Z9981 Dependence on supplemental oxygen: Secondary | ICD-10-CM | POA: Diagnosis not present

## 2023-01-22 ENCOUNTER — Encounter: Payer: Self-pay | Admitting: Family Medicine

## 2023-01-22 DIAGNOSIS — E119 Type 2 diabetes mellitus without complications: Secondary | ICD-10-CM | POA: Diagnosis not present

## 2023-01-22 DIAGNOSIS — I441 Atrioventricular block, second degree: Secondary | ICD-10-CM | POA: Diagnosis not present

## 2023-01-22 DIAGNOSIS — R112 Nausea with vomiting, unspecified: Secondary | ICD-10-CM | POA: Diagnosis not present

## 2023-01-22 DIAGNOSIS — Z431 Encounter for attention to gastrostomy: Secondary | ICD-10-CM | POA: Diagnosis not present

## 2023-01-22 DIAGNOSIS — Z87891 Personal history of nicotine dependence: Secondary | ICD-10-CM | POA: Diagnosis not present

## 2023-01-22 DIAGNOSIS — Z9981 Dependence on supplemental oxygen: Secondary | ICD-10-CM | POA: Diagnosis not present

## 2023-01-22 DIAGNOSIS — K224 Dyskinesia of esophagus: Secondary | ICD-10-CM | POA: Diagnosis not present

## 2023-01-22 DIAGNOSIS — R519 Headache, unspecified: Secondary | ICD-10-CM | POA: Diagnosis not present

## 2023-01-22 DIAGNOSIS — I272 Pulmonary hypertension, unspecified: Secondary | ICD-10-CM | POA: Diagnosis not present

## 2023-01-22 DIAGNOSIS — G4733 Obstructive sleep apnea (adult) (pediatric): Secondary | ICD-10-CM | POA: Diagnosis not present

## 2023-01-22 DIAGNOSIS — I5032 Chronic diastolic (congestive) heart failure: Secondary | ICD-10-CM | POA: Diagnosis not present

## 2023-01-22 DIAGNOSIS — R1314 Dysphagia, pharyngoesophageal phase: Secondary | ICD-10-CM | POA: Diagnosis not present

## 2023-01-22 DIAGNOSIS — Z7901 Long term (current) use of anticoagulants: Secondary | ICD-10-CM | POA: Diagnosis not present

## 2023-01-22 DIAGNOSIS — J9622 Acute and chronic respiratory failure with hypercapnia: Secondary | ICD-10-CM | POA: Diagnosis not present

## 2023-01-22 DIAGNOSIS — Z950 Presence of cardiac pacemaker: Secondary | ICD-10-CM | POA: Diagnosis not present

## 2023-01-22 DIAGNOSIS — E43 Unspecified severe protein-calorie malnutrition: Secondary | ICD-10-CM | POA: Diagnosis not present

## 2023-01-22 DIAGNOSIS — I110 Hypertensive heart disease with heart failure: Secondary | ICD-10-CM | POA: Diagnosis not present

## 2023-01-22 DIAGNOSIS — Z6821 Body mass index (BMI) 21.0-21.9, adult: Secondary | ICD-10-CM | POA: Diagnosis not present

## 2023-01-22 DIAGNOSIS — I4821 Permanent atrial fibrillation: Secondary | ICD-10-CM | POA: Diagnosis not present

## 2023-01-22 DIAGNOSIS — K5909 Other constipation: Secondary | ICD-10-CM | POA: Diagnosis not present

## 2023-01-22 DIAGNOSIS — J90 Pleural effusion, not elsewhere classified: Secondary | ICD-10-CM | POA: Diagnosis not present

## 2023-01-22 DIAGNOSIS — F331 Major depressive disorder, recurrent, moderate: Secondary | ICD-10-CM | POA: Diagnosis not present

## 2023-01-22 DIAGNOSIS — R627 Adult failure to thrive: Secondary | ICD-10-CM | POA: Diagnosis not present

## 2023-01-22 DIAGNOSIS — Z9889 Other specified postprocedural states: Secondary | ICD-10-CM | POA: Diagnosis not present

## 2023-01-22 DIAGNOSIS — E873 Alkalosis: Secondary | ICD-10-CM | POA: Diagnosis not present

## 2023-01-22 NOTE — Telephone Encounter (Signed)
Called pt and made an appointment for 01/23/2023 at 2pm

## 2023-01-22 NOTE — Telephone Encounter (Signed)
There is one appt available tomorrow on my schedule. Can we get her into it?

## 2023-01-23 ENCOUNTER — Other Ambulatory Visit
Admission: RE | Admit: 2023-01-23 | Discharge: 2023-01-23 | Disposition: A | Discharge status: Home / Self Care | Payer: Medicaid Other | Attending: Cardiology | Admitting: Cardiology

## 2023-01-23 ENCOUNTER — Ambulatory Visit: Payer: Medicaid Other | Admitting: Family Medicine

## 2023-01-23 ENCOUNTER — Encounter: Payer: Self-pay | Admitting: Family Medicine

## 2023-01-23 VITALS — BP 132/56 | HR 70 | Temp 98.3°F | Ht 68.0 in | Wt 149.0 lb

## 2023-01-23 DIAGNOSIS — B029 Zoster without complications: Secondary | ICD-10-CM

## 2023-01-23 DIAGNOSIS — L03311 Cellulitis of abdominal wall: Secondary | ICD-10-CM

## 2023-01-23 DIAGNOSIS — I5032 Chronic diastolic (congestive) heart failure: Secondary | ICD-10-CM | POA: Insufficient documentation

## 2023-01-23 DIAGNOSIS — Z931 Gastrostomy status: Secondary | ICD-10-CM | POA: Diagnosis not present

## 2023-01-23 DIAGNOSIS — Z23 Encounter for immunization: Secondary | ICD-10-CM | POA: Diagnosis not present

## 2023-01-23 LAB — BASIC METABOLIC PANEL
Anion gap: 8 (ref 5–15)
BUN: 21 mg/dL (ref 8–23)
CO2: 38 mmol/L — ABNORMAL HIGH (ref 22–32)
Calcium: 9.2 mg/dL (ref 8.9–10.3)
Chloride: 90 mmol/L — ABNORMAL LOW (ref 98–111)
Creatinine, Ser: 0.35 mg/dL — ABNORMAL LOW (ref 0.44–1.00)
GFR, Estimated: >60 mL/min (ref >60)
Glucose, Bld: 157 mg/dL — ABNORMAL HIGH (ref 70–99)
Potassium: 3.8 mmol/L (ref 3.5–5.1)
Sodium: 136 mmol/L (ref 135–145)

## 2023-01-23 MED ORDER — BACITRACIN 500 UNIT/GM EX OINT: 1.0000 | TOPICAL_OINTMENT | Freq: Two times a day (BID) | CUTANEOUS | 0 refills | Status: DC

## 2023-01-23 NOTE — Progress Notes (Signed)
Established Patient Office Visit  Subjective   Patient ID: Julia Stark, female    DOB: 1960-06-27  Age: 62 y.o. MRN: [id number]  Chief Complaint  Patient presents with   Rash    Pt stated--right butt cheek have sore, redness--1 week  and check G-tube.    HPI  Discussed the use of AI scribe software for clinical note transcription with the patient, who gave verbal consent to proceed.  History of Present Illness   The patient, with a history of G-tube placement, presents with a rash around the G-tube site that started three days ago. The rash is described as red and pussy, with some stuff coming out of it. The patient has been using hydrogen peroxide and gauze to manage the rash. The patient also reports a rash on her bottom that has been present for about a week. Initially, the rash was very tender and a little itchy, but the patient reports that it has been improving. The patient also mentions a persistent cough and drainage, which she manages by spitting it out. The patient's companion expresses concern about the patient's mental health, stating that the patient has not been mentally stable and has been struggling with feelings of guilt and regret over her medical decisions.         ROS    Objective:     BP (!) 132/56 (BP Location: Left Arm, Patient Position: Sitting, Cuff Size: Normal)   Pulse 70   Temp 98.3 F (36.8 C)   Ht 5\' 8"  (1.727 m)   Wt 149 lb (67.6 kg)   LMP 04/13/2011   SpO2 99%   BMI 22.66 kg/m    Physical Exam  Physical Exam   MEASUREMENTS: WT- 146-149 pounds SKIN: Rash on bottom and around G tube site, red with granulation tissue around tube, slightly yellowish. Mild infection with redness, tenderness, and purulent discharge around G tube site. Vesicular rash on bottom, several blisters clustered together, red underneath, indicative of shingles.        Results for orders placed or performed during the hospital encounter of 01/23/23  Basic  Metabolic Panel (BMET)  Result Value Ref Range   Sodium 136 135 - 145 mmol/L   Potassium 3.8 3.5 - 5.1 mmol/L   Chloride 90 (L) 98 - 111 mmol/L   CO2 38 (H) 22 - 32 mmol/L   Glucose, Bld 157 (H) 70 - 99 mg/dL   BUN 21 8 - 23 mg/dL   Creatinine, Ser 0.27 (L) 0.44 - 1.00 mg/dL   Calcium 9.2 8.9 - 25.3 mg/dL   GFR, Estimated >66 >44 mL/min   Anion gap 8 5 - 15      The 10-year ASCVD risk score (Arnett DK, et al., 2019) is: 11.3%    Assessment & Plan:   Problem List Items Addressed This Visit   None Visit Diagnoses     Gastrostomy tube in place [facility])    -  Primary   Cellulitis of abdominal wall       Herpes zoster without complication       Relevant Medications   bacitracin 500 UNIT/GM ointment   Needs flu shot       Relevant Orders   Flu vaccine trivalent PF, 6mos and older(Flulaval,Afluria,Fluarix,Fluzone) (Completed)           Gastrostomy tube site infection Redness, tenderness, and purulent discharge noted around the G-tube site. Discussed the inappropriate use of hydrogen peroxide which can exacerbate granulation tissue and inflammation. -Discontinue use  of hydrogen peroxide. -Start topical Bacitracin ointment around the G-tube site. -Continue use of gauze dressing to manage drainage.  Herpes Zoster (Shingles) Clustered vesicular rash on the buttock, tender and itchy, present for about a week but improving. Despite having received the shingles vaccine, the patient has a mild case of shingles. -Observe and monitor the rash. -Consider antiviral therapy if the rash worsens.  Post-nasal drainage Chronic clear post-nasal drainage, particularly after eating. Discussed the use of over-the-counter Mucinex and nasal sprays as potential management options. -Consider use of plain Mucinex and/or nasal sprays as needed for symptom management.  Influenza vaccination Discussed the benefits of receiving the flu shot, particularly given the patient's respiratory issues and high  risk for complications from the flu. -Administer influenza vaccine today.  Follow-up Scheduled for a regular visit in December.       Meds ordered this encounter  Medications   bacitracin 500 UNIT/GM ointment    Sig: Apply 1 Application topically 2 (two) times daily.    Dispense:  15 g    Refill:  0      No follow-ups on file.    [name], MD

## 2023-01-24 DIAGNOSIS — Z431 Encounter for attention to gastrostomy: Secondary | ICD-10-CM | POA: Diagnosis not present

## 2023-01-24 DIAGNOSIS — Z9889 Other specified postprocedural states: Secondary | ICD-10-CM | POA: Diagnosis not present

## 2023-01-24 DIAGNOSIS — Z7901 Long term (current) use of anticoagulants: Secondary | ICD-10-CM | POA: Diagnosis not present

## 2023-01-24 DIAGNOSIS — E873 Alkalosis: Secondary | ICD-10-CM | POA: Diagnosis not present

## 2023-01-24 DIAGNOSIS — E43 Unspecified severe protein-calorie malnutrition: Secondary | ICD-10-CM | POA: Diagnosis not present

## 2023-01-24 DIAGNOSIS — Z6821 Body mass index (BMI) 21.0-21.9, adult: Secondary | ICD-10-CM | POA: Diagnosis not present

## 2023-01-24 DIAGNOSIS — R1314 Dysphagia, pharyngoesophageal phase: Secondary | ICD-10-CM | POA: Diagnosis not present

## 2023-01-24 DIAGNOSIS — Z87891 Personal history of nicotine dependence: Secondary | ICD-10-CM | POA: Diagnosis not present

## 2023-01-24 DIAGNOSIS — F331 Major depressive disorder, recurrent, moderate: Secondary | ICD-10-CM | POA: Diagnosis not present

## 2023-01-24 DIAGNOSIS — I4821 Permanent atrial fibrillation: Secondary | ICD-10-CM | POA: Diagnosis not present

## 2023-01-24 DIAGNOSIS — I272 Pulmonary hypertension, unspecified: Secondary | ICD-10-CM | POA: Diagnosis not present

## 2023-01-24 DIAGNOSIS — E119 Type 2 diabetes mellitus without complications: Secondary | ICD-10-CM | POA: Diagnosis not present

## 2023-01-24 DIAGNOSIS — Z950 Presence of cardiac pacemaker: Secondary | ICD-10-CM | POA: Diagnosis not present

## 2023-01-24 DIAGNOSIS — Z9981 Dependence on supplemental oxygen: Secondary | ICD-10-CM | POA: Diagnosis not present

## 2023-01-24 DIAGNOSIS — I110 Hypertensive heart disease with heart failure: Secondary | ICD-10-CM | POA: Diagnosis not present

## 2023-01-24 DIAGNOSIS — R112 Nausea with vomiting, unspecified: Secondary | ICD-10-CM | POA: Diagnosis not present

## 2023-01-24 DIAGNOSIS — R627 Adult failure to thrive: Secondary | ICD-10-CM | POA: Diagnosis not present

## 2023-01-24 DIAGNOSIS — I5032 Chronic diastolic (congestive) heart failure: Secondary | ICD-10-CM | POA: Diagnosis not present

## 2023-01-24 DIAGNOSIS — R519 Headache, unspecified: Secondary | ICD-10-CM | POA: Diagnosis not present

## 2023-01-24 DIAGNOSIS — J9622 Acute and chronic respiratory failure with hypercapnia: Secondary | ICD-10-CM | POA: Diagnosis not present

## 2023-01-24 DIAGNOSIS — K5909 Other constipation: Secondary | ICD-10-CM | POA: Diagnosis not present

## 2023-01-24 DIAGNOSIS — G4733 Obstructive sleep apnea (adult) (pediatric): Secondary | ICD-10-CM | POA: Diagnosis not present

## 2023-01-24 DIAGNOSIS — K224 Dyskinesia of esophagus: Secondary | ICD-10-CM | POA: Diagnosis not present

## 2023-01-24 DIAGNOSIS — I441 Atrioventricular block, second degree: Secondary | ICD-10-CM | POA: Diagnosis not present

## 2023-01-24 DIAGNOSIS — J90 Pleural effusion, not elsewhere classified: Secondary | ICD-10-CM | POA: Diagnosis not present

## 2023-01-26 DIAGNOSIS — R627 Adult failure to thrive: Secondary | ICD-10-CM | POA: Diagnosis not present

## 2023-01-26 DIAGNOSIS — J90 Pleural effusion, not elsewhere classified: Secondary | ICD-10-CM | POA: Diagnosis not present

## 2023-01-26 DIAGNOSIS — J9622 Acute and chronic respiratory failure with hypercapnia: Secondary | ICD-10-CM | POA: Diagnosis not present

## 2023-01-26 DIAGNOSIS — I5032 Chronic diastolic (congestive) heart failure: Secondary | ICD-10-CM | POA: Diagnosis not present

## 2023-01-26 DIAGNOSIS — K5909 Other constipation: Secondary | ICD-10-CM | POA: Diagnosis not present

## 2023-01-26 DIAGNOSIS — I4821 Permanent atrial fibrillation: Secondary | ICD-10-CM | POA: Diagnosis not present

## 2023-01-26 DIAGNOSIS — K224 Dyskinesia of esophagus: Secondary | ICD-10-CM | POA: Diagnosis not present

## 2023-01-26 DIAGNOSIS — Z9981 Dependence on supplemental oxygen: Secondary | ICD-10-CM | POA: Diagnosis not present

## 2023-01-26 DIAGNOSIS — I272 Pulmonary hypertension, unspecified: Secondary | ICD-10-CM | POA: Diagnosis not present

## 2023-01-26 DIAGNOSIS — Z950 Presence of cardiac pacemaker: Secondary | ICD-10-CM | POA: Diagnosis not present

## 2023-01-26 DIAGNOSIS — F331 Major depressive disorder, recurrent, moderate: Secondary | ICD-10-CM | POA: Diagnosis not present

## 2023-01-26 DIAGNOSIS — Z9889 Other specified postprocedural states: Secondary | ICD-10-CM | POA: Diagnosis not present

## 2023-01-26 DIAGNOSIS — I110 Hypertensive heart disease with heart failure: Secondary | ICD-10-CM | POA: Diagnosis not present

## 2023-01-26 DIAGNOSIS — G4733 Obstructive sleep apnea (adult) (pediatric): Secondary | ICD-10-CM | POA: Diagnosis not present

## 2023-01-26 DIAGNOSIS — E43 Unspecified severe protein-calorie malnutrition: Secondary | ICD-10-CM | POA: Diagnosis not present

## 2023-01-26 DIAGNOSIS — Z87891 Personal history of nicotine dependence: Secondary | ICD-10-CM | POA: Diagnosis not present

## 2023-01-26 DIAGNOSIS — R112 Nausea with vomiting, unspecified: Secondary | ICD-10-CM | POA: Diagnosis not present

## 2023-01-26 DIAGNOSIS — E873 Alkalosis: Secondary | ICD-10-CM | POA: Diagnosis not present

## 2023-01-26 DIAGNOSIS — I441 Atrioventricular block, second degree: Secondary | ICD-10-CM | POA: Diagnosis not present

## 2023-01-26 DIAGNOSIS — Z6821 Body mass index (BMI) 21.0-21.9, adult: Secondary | ICD-10-CM | POA: Diagnosis not present

## 2023-01-26 DIAGNOSIS — E119 Type 2 diabetes mellitus without complications: Secondary | ICD-10-CM | POA: Diagnosis not present

## 2023-01-26 DIAGNOSIS — Z431 Encounter for attention to gastrostomy: Secondary | ICD-10-CM | POA: Diagnosis not present

## 2023-01-26 DIAGNOSIS — R1314 Dysphagia, pharyngoesophageal phase: Secondary | ICD-10-CM | POA: Diagnosis not present

## 2023-01-26 DIAGNOSIS — R519 Headache, unspecified: Secondary | ICD-10-CM | POA: Diagnosis not present

## 2023-01-26 DIAGNOSIS — Z7901 Long term (current) use of anticoagulants: Secondary | ICD-10-CM | POA: Diagnosis not present

## 2023-01-29 ENCOUNTER — Telehealth: Payer: Self-pay

## 2023-01-29 ENCOUNTER — Other Ambulatory Visit: Payer: Self-pay | Admitting: *Deleted

## 2023-01-29 NOTE — Telephone Encounter (Signed)
  Medicaid Managed Care   Unsuccessful Outreach Note  01/29/2023 Name: Julia Stark MRN: [id number] DOB: Nov 07, 1960  Referred by: [name], MD Reason for referral : No chief complaint on file.   An unsuccessful telephone outreach was attempted today. The patient was referred to the case management team for assistance with care management and care coordination.   Follow Up Plan: If patient returns call to provider office, please advise to call Embedded Care Management Care Guide Nicholes Rough* at [phone]*  Nicholes Rough, CMA Care Guide [employer]

## 2023-01-29 NOTE — Patient Outreach (Signed)
Medicaid Managed Care   Nurse Care Manager Note  01/29/2023 Name:  Julia Stark MRN:  [id number] DOB:  [birth year]  Julia Stark is an 62 y.o. year old female who is a primary patient of Bacigalupo, Marzella Schlein, MD.  The [facility] Managed Care Coordination team was consulted for assistance with:    Dysphagia  Julia Stark was given information about Medicaid Managed Care Coordination team services today. Julia Stark Patient agreed to services and verbal consent obtained.  Engaged with patient by telephone for follow up visit in response to provider referral for case management and/or care coordination services.   Assessments/Interventions:  Review of past medical history, allergies, medications, health status, including review of consultants reports, laboratory and other test data, was performed as part of comprehensive evaluation and provision of chronic care management services.  SDOH (Social Determinants of Health) assessments and interventions performed: SDOH Interventions    Flowsheet Row Patient Outreach Telephone from 01/29/2023 in [place] POPULATION HEALTH DEPARTMENT Telephone from 12/14/2022 in [place] POPULATION HEALTH DEPARTMENT Telephone from 12/06/2022 in [place] POPULATION HEALTH DEPARTMENT Office Visit from 12/05/2022 in [facility] Practice Office Visit from 02/27/2022 in [facility] Office Visit from 08/02/2021 in [facility]  SDOH Interventions        Food Insecurity Interventions Intervention Not Indicated Intervention Not Indicated -- -- -- --  Housing Interventions -- Intervention Not Indicated -- -- -- --  Transportation Interventions -- Intervention Not Indicated -- -- -- --  Utilities Interventions -- Intervention Not Indicated -- -- -- --  Depression Interventions/Treatment  -- --  [referred patient to LCSW for counseling/assistance with coping with life-altering chronic illness] --  Currently on Treatment Currently on Treatment PHQ2-9 Score <4 Follow-up Not Indicated  Physical Activity Interventions -- -- Other (Comments)  [HHS PT 2Xs per week] -- -- --  Health Literacy Interventions -- Intervention Not Indicated -- -- -- --       Care Plan  Allergies  Allergen Reactions   Acetazolamide Other (See Comments)    Confusion   Amiodarone Other (See Comments)    Other Reaction(s): Other (See Comments), Vomiting  Blurred vision   Flecainide Nausea And Vomiting    Hallucination   Metoprolol Shortness Of Breath     Muscle Pain     Trazodone Other (See Comments)    headaches    Medications Reviewed Today     Reviewed by Heidi Dach, RN (Registered Nurse) on 01/29/23 at 1030  Med List Status: <None>   Medication Order Taking? Sig Documenting Provider Last Dose Status Informant  acetaminophen (TYLENOL) 160 MG/5ML liquid [id number] Yes Take 500 mg by mouth every 4 (four) hours as needed for fever or pain. [provider] Taking Active   apixaban (ELIQUIS) 5 MG TABS tablet [id number] Yes Take 1 tablet (5 mg total) by mouth 2 (two) times daily. Laurey Morale, MD Taking Active   bacitracin 500 UNIT/GM ointment [id number] No Apply 1 Application topically 2 (two) times daily.  Patient not taking: Reported on 01/29/2023   [name], MD Not Taking Active   Furosemide (FUROSCIX) 80 MG/10ML CTKT [id number] Yes Inject 1 Cartridge into the skin as directed. Delma Freeze, FNP Taking Active   KLOR-CON 20 MEQ packet [id number] Yes MIX 2 PACKETS ( ) AS DIRECTED AND TAKE DAILY AS NEEDED (WHEN YOU USE FUROSEMIDE) Delma Freeze, FNP Taking Active   linaclotide Karlene Einstein) 72 MCG capsule [id number] Yes  Take 1 capsule (72 mcg total) by mouth daily as needed. Simmons-Robinson, Makiera, MD Taking Active   melatonin 5 MG TABS [id number] Yes Take 5 mg by mouth at bedtime. [provider] Taking Active            Med Note Mliss Fritz, CHERYL A   Fri Jan 05, 2023  3:31 PM) Rarely takes, takes as needed  metoCLOPramide (REGLAN) 5 MG/5ML solution [id number] Yes Place 2.5 mLs (2.5 mg total) into feeding tube 2 (two) times daily. Simmons-Robinson, Makiera, MD Taking Active   Multiple Vitamins-Minerals (TROPICAL LIQUID NUTRITION) LIQD [id number] Yes Take 15 mLs by G tube once daily [provider] Taking Active   omeprazole (KONVOMEP) 2 mg/mL SUSP oral suspension [id number] Yes Take 10 mLs (20 mg total) by mouth 2 (two) times daily. Simmons-Robinson, Makiera, MD Taking Active   polyethylene glycol powder (GLYCOLAX/MIRALAX) 17 GM/SCOOP powder [id number] No Take 17 g by mouth 2 (two) times daily as needed.  Patient not taking: Reported on 01/29/2023   Jacky Kindle, FNP Not Taking Active   rosuvastatin (CRESTOR) 5 MG tablet [id number] Yes Take 1 tablet (5 mg total) by mouth every other day. [name], MD Taking Active   spironolactone (ALDACTONE) 25 MG tablet [id number] Yes Take 1 tablet (25 mg total) by mouth daily. Laurey Morale, MD Taking Active   Torsemide 40 MG TABS [id number] Yes Take 40 mg by mouth in the morning AND 20 mg every evening. Laurey Morale, MD Taking Active            Med Note Mliss Fritz, CHERYL A   Fri Jan 05, 2023  3:54 PM) Increased dosing of Torsemide along with KCL started on 01/04/23  venlafaxine (EFFEXOR) 75 MG tablet [id number] Yes Take 1 tablet (75 mg total) by mouth 2 (two) times daily with a meal. [name], MD Taking Active             Patient Active Problem List   Diagnosis Date Noted   Esophageal dysphagia 12/05/2022   Acute respiratory failure with hypoxia and hypercarbia (HCC) 12/05/2022   Injury of phrenic nerve 12/05/2022   Failure to thrive in adult 12/05/2022   Tinnitus, right ear 12/05/2022   Pulmonary hypertension (HCC) 12/05/2022   Anemia 02/27/2022   Acquired thrombophilia (HCC) 07/14/2021   Status post radiofrequency ablation for arrhythmia 01/09/2021   On dronedarone therapy  11/16/2020   Renal stones 11/16/2020   Salivary gland calculi 11/16/2020   Ureteral stone 05/15/2020   Moderate episode of recurrent major depressive disorder (HCC) 12/29/2019   Overweight 12/04/2019   Basal cell carcinoma of face 10/03/2019   Typical atrial flutter (HCC) 08/15/2019   Lipoma of left upper extremity 08/04/2019   Primary insomnia 06/23/2019   Red blood cell antibody positive 05/22/2019   Chronic anticoagulation 06/19/2018   Affective disorder, major 08/22/2017   Allergic rhinitis 08/22/2017   Anxiety, generalized 08/22/2017   T2DM (type 2 diabetes mellitus) (HCC) 08/22/2017   Hypertension associated with diabetes (HCC) 08/22/2017   Hyperlipidemia associated with type 2 diabetes mellitus (HCC) 08/22/2017   Mobitz type 2 second degree atrioventricular block 02/05/2017   Paroxysmal A-fib (HCC) 05/25/2015   Recurrent genital HSV (herpes simplex virus) infection 03/13/2014   Chronic constipation 02/17/2014   Reflux gastritis 02/17/2014   Adenomatous polyp of descending colon 02/12/2012    Conditions to be addressed/monitored per PCP order:   Dysphagia  Care Plan : RN Care Manager Plan of Care  Updates made by  Heidi Dach, RN since 01/29/2023 12:00 AM     Problem: Health Management needs related to Dysphagia      Long-Range Goal: Development of Plan of Care to address Health Management needs related to Dysphagia   Start Date: 01/29/2023  Expected End Date: 04/29/2023  Note:   Current Barriers:  Chronic Disease Management support and education needs related to Dysphagia   RNCM Clinical Goal(s):  Patient will verbalize understanding of plan for management of Dysphagia as evidenced by Patient reports take all medications exactly as prescribed and will call provider for medication related questions as evidenced by patient reports attend all scheduled medical appointments: 10/29 for Echocardiogram, 10/30 with LCSW, 11/6 with HF Clinic, 11/7 for Therapy and 12/3  with PCP as evidenced by Provider documentation in EMR continue to work with RN Care Manager to address care management and care coordination needs related to  Dysphagia as evidenced by adherence to CM Team Scheduled appointments through collaboration with RN Care manager, provider, and care team.   Interventions: Evaluation of current treatment plan related to  self management and patient's adherence to plan as established by provider   Dysphagia  (Status:  New goal.)  Long Term Goal Evaluation of current treatment plan related to  Dysphagia ,  self-management and patient's adherence to plan as established by provider. Discussed plans with patient for ongoing care management follow up and provided patient with direct contact information for care management team Advised patient to follow up with [facility] Reviewed medications with patient and discussed medications for constipation-needing to get more Miralax and taking as directed Reviewed scheduled/upcoming provider appointments including 01/30/23 for Echocardiogram, 10/30 with LCSW, 11/6 with HF Clinic, 11/7 for Therapy, and 12/3 with PCP Assessed social determinant of health barriers Advised patient to contact [facility] [phone] for member benefits Discussed setting a goal of cutting back one tube feeding a week to work towards goal of having G-tube removed  Patient Goals/Self-Care Activities: Take all medications as prescribed Attend all scheduled provider appointments Call provider office for new concerns or questions   Follow Up Plan:  Telephone follow up appointment with care management team member scheduled for:  03/05/23 at 10:30am      Follow Up:  Patient agrees to Care Plan and Follow-up.  Plan: The Managed Medicaid care management team will reach out to the patient again over the next 30 days.  Date/time of next scheduled RN care management/care coordination outreach:  03/05/23 at  10:30am  Estanislado Emms RN, BSN [place]  [facility] RN Care Coordinator [phone]

## 2023-01-29 NOTE — Patient Instructions (Signed)
Visit Information  Julia Stark was given information about Medicaid Managed Care team care coordination services as a part of their [employer] Medicaid benefit. Julia Stark verbally consented to engagement with the [facility] Managed Care team.   If you are experiencing a medical emergency, please call 911 or report to your local emergency department or urgent care.   If you have a non-emergency medical problem during routine business hours, please contact your provider's office and ask to speak with a nurse.   For questions related to your Healthy [facility] health plan, please call: [phone] or visit the homepage here: [email or web address]  If you would like to schedule transportation through your [facility] plan, please call the following number at least 2 days in advance of your appointment: [phone]  For information about your ride after you set it up, call Ride Assist at [phone]. Use this number to activate a Will Call pickup, or if your transportation is late for a scheduled pickup. Use this number, too, if you need to make a change or cancel a previously scheduled reservation.  If you need transportation services right away, call [phone]. The after-hours call center is staffed 24 hours to handle ride assistance and urgent reservation requests (including discharges) 365 days a year. Urgent trips include sick visits, hospital discharge requests and life-sustaining treatment.  Call the [facility] Line at [phone], at any time, 24 hours a day, 7 days a week. If you are in danger or need immediate medical attention call 911.  If you would like help to quit smoking, call 1-800-QUIT-NOW (508-803-8640) OR Espaol: 1-855-Djelo-Ya (8-756-433-2951) o para ms informacin haga clic aqu or Text READY to 884-166 to register via text  Julia Stark,   Please see education materials related to dysphagia provided by  MyChart link.  Patient verbalizes understanding of instructions and care plan provided today and agrees to view in MyChart. Active MyChart status and patient understanding of how to access instructions and care plan via MyChart confirmed with patient.     Telephone follow up appointment with Managed Medicaid care management team member scheduled for:03/05/23 at 10:30am  Julia Emms RN, BSN [place]  Value[facility]Based Care Institute [facility] RN Care Coordinator [phone]   Following is a copy of your plan of care:  Care Plan : RN Care Manager Plan of Care  Updates made by Julia Dach, RN since 01/29/2023 12:00 AM     Problem: Health Management needs related to Dysphagia      Long-Range Goal: Development of Plan of Care to address Health Management needs related to Dysphagia   Start Date: 01/29/2023  Expected End Date: 04/29/2023  Note:   Current Barriers:  Chronic Disease Management support and education needs related to Dysphagia   RNCM Clinical Goal(s):  Patient will verbalize understanding of plan for management of Dysphagia as evidenced by Patient reports take all medications exactly as prescribed and will call provider for medication related questions as evidenced by patient reports attend all scheduled medical appointments: 10/29 for Echocardiogram, 10/30 with LCSW, 11/6 with HF Clinic, 11/7 for Therapy and 12/3 with PCP as evidenced by Provider documentation in EMR continue to work with RN Care Manager to address care management and care coordination needs related to  Dysphagia as evidenced by adherence to CM Team Scheduled appointments through collaboration with RN Care manager, provider, and care team.   Interventions: Evaluation of current treatment plan related to  self management and patient's adherence to plan as  established by provider   Dysphagia  (Status:  New goal.)  Long Term Goal Evaluation of current treatment plan related to  Dysphagia ,   self-management and patient's adherence to plan as established by provider. Discussed plans with patient for ongoing care management follow up and provided patient with direct contact information for care management team Advised patient to follow up with [facility] Reviewed medications with patient and discussed medications for constipation-needing to get more Miralax and taking as directed Reviewed scheduled/upcoming provider appointments including 01/30/23 for Echocardiogram, 10/30 with LCSW, 11/6 with HF Clinic, 11/7 for Therapy, and 12/3 with PCP Assessed social determinant of health barriers Advised patient to contact [facility] [phone] for member benefits Discussed setting a goal of cutting back one tube feeding a week to work towards goal of having G-tube removed  Patient Goals/Self-Care Activities: Take all medications as prescribed Attend all scheduled provider appointments Call provider office for new concerns or questions   Follow Up Plan:  Telephone follow up appointment with care management team member scheduled for:  03/05/23 at 10:30am

## 2023-01-30 ENCOUNTER — Ambulatory Visit
Admission: RE | Admit: 2023-01-30 | Discharge: 2023-01-30 | Disposition: A | Discharge status: Home / Self Care | Payer: Medicaid Other | Source: Ambulatory Visit | Attending: Cardiology | Admitting: Cardiology

## 2023-01-30 DIAGNOSIS — I4891 Unspecified atrial fibrillation: Secondary | ICD-10-CM | POA: Diagnosis not present

## 2023-01-30 DIAGNOSIS — I081 Rheumatic disorders of both mitral and tricuspid valves: Secondary | ICD-10-CM | POA: Insufficient documentation

## 2023-01-30 DIAGNOSIS — E119 Type 2 diabetes mellitus without complications: Secondary | ICD-10-CM | POA: Diagnosis not present

## 2023-01-30 DIAGNOSIS — I110 Hypertensive heart disease with heart failure: Secondary | ICD-10-CM | POA: Insufficient documentation

## 2023-01-30 DIAGNOSIS — Z950 Presence of cardiac pacemaker: Secondary | ICD-10-CM | POA: Insufficient documentation

## 2023-01-30 DIAGNOSIS — I5032 Chronic diastolic (congestive) heart failure: Secondary | ICD-10-CM | POA: Insufficient documentation

## 2023-01-30 LAB — ECHOCARDIOGRAM COMPLETE
AR max vel: 1.94 cm2
AV Area VTI: 2.36 cm2
AV Area mean vel: 1.71 cm2
AV Mean grad: 4 mm[Hg]
AV Peak grad: 6.9 mm[Hg]
Ao pk vel: 1.32 m/s
Area-P 1/2: 3.77 cm2
S' Lateral: 3.3 cm

## 2023-01-31 ENCOUNTER — Telehealth: Payer: Self-pay | Admitting: Family

## 2023-01-31 ENCOUNTER — Other Ambulatory Visit: Payer: Self-pay | Admitting: Licensed Clinical Social Worker

## 2023-01-31 DIAGNOSIS — J984 Other disorders of lung: Secondary | ICD-10-CM | POA: Diagnosis not present

## 2023-01-31 DIAGNOSIS — R1314 Dysphagia, pharyngoesophageal phase: Secondary | ICD-10-CM | POA: Diagnosis not present

## 2023-01-31 DIAGNOSIS — G1229 Other motor neuron disease: Secondary | ICD-10-CM | POA: Diagnosis not present

## 2023-01-31 NOTE — Patient Outreach (Signed)
Medicaid Managed Care Social Work Note  01/31/2023 Name:  Julia Stark MRN:  [id number] DOB:  [birth year]  Julia Stark is an 62 y.o. year old female who is a primary patient of Bacigalupo, Marzella Schlein, MD.  The Medicaid Managed Care Coordination team was consulted for assistance with:  Mental Health Counseling and Resources  Julia Stark was given information about Medicaid Managed Care Coordination team services today. Julia Stark Patient agreed to services and verbal consent obtained.  Engaged with patient  for by telephone forfollow up visit in response to referral for case management and/or care coordination services.   Assessments/Interventions:  Review of past medical history, allergies, medications, health status, including review of consultants reports, laboratory and other test data, was performed as part of comprehensive evaluation and provision of chronic care management services.  SDOH: (Social Determinant of Health) assessments and interventions performed: SDOH Interventions    Flowsheet Row Patient Outreach Telephone from 01/31/2023 in [place] [facility]UL[facility] DEPARTMENT Patient Outreach Telephone from 01/29/2023 in [place] POPULATION HEALTH DEPARTMENT Telephone from 12/14/2022 in [place] POPULATION HEALTH DEPARTMENT Telephone from 12/06/2022 in [place] POPULATION HEALTH DEPARTMENT Office Visit from 12/05/2022 in [facility] Office Visit from 02/27/2022 in [facility]  SDOH Interventions        Food Insecurity Interventions -- Intervention Not Indicated Intervention Not Indicated -- -- --  Housing Interventions -- -- Intervention Not Indicated -- -- --  Transportation Interventions -- -- Intervention Not Indicated -- -- --  Utilities Interventions -- -- Intervention Not Indicated -- -- --  Depression Interventions/Treatment  -- -- --  [referred patient to LCSW for counseling/assistance with coping with  life-altering chronic illness] -- Currently on Treatment Currently on Treatment  Physical Activity Interventions -- -- -- Other (Comments)  [HHS PT 2Xs per week] -- --  Stress Interventions Other (Comment)  [Referral placed for talk therapy] -- -- -- -- --  Health Literacy Interventions -- -- Intervention Not Indicated -- -- --       Advanced Directives Status:  See Care Plan for related entries.  Care Plan                 Allergies  Allergen Reactions   Acetazolamide Other (See Comments)    Confusion   Amiodarone Other (See Comments)    Other Reaction(s): Other (See Comments), Vomiting  Blurred vision   Flecainide Nausea And Vomiting    Hallucination   Metoprolol Shortness Of Breath     Muscle Pain     Trazodone Other (See Comments)    headaches    Medications Reviewed Today   Medications were not reviewed in this encounter     Patient Active Problem List   Diagnosis Date Noted   Esophageal dysphagia 12/05/2022   Acute respiratory failure with hypoxia and hypercarbia (HCC) 12/05/2022   Injury of phrenic nerve 12/05/2022   Failure to thrive in adult 12/05/2022   Tinnitus, right ear 12/05/2022   Pulmonary hypertension (HCC) 12/05/2022   Anemia 02/27/2022   Acquired thrombophilia (HCC) 07/14/2021   Status post radiofrequency ablation for arrhythmia 01/09/2021   On dronedarone therapy 11/16/2020   Renal stones 11/16/2020   Salivary gland calculi 11/16/2020   Ureteral stone 05/15/2020   Moderate episode of recurrent major depressive disorder (HCC) 12/29/2019   Overweight 12/04/2019   Basal cell carcinoma of face 10/03/2019   Typical atrial flutter (HCC) 08/15/2019   Lipoma of left upper extremity 08/04/2019  Primary insomnia 06/23/2019   Red blood cell antibody positive 05/22/2019   Chronic anticoagulation 06/19/2018   Affective disorder, major 08/22/2017   Allergic rhinitis 08/22/2017   Anxiety, generalized 08/22/2017   T2DM (type 2 diabetes mellitus) (HCC)  08/22/2017   Hypertension associated with diabetes (HCC) 08/22/2017   Hyperlipidemia associated with type 2 diabetes mellitus (HCC) 08/22/2017   Mobitz type 2 second degree atrioventricular block 02/05/2017   Paroxysmal A-fib (HCC) 05/25/2015   Recurrent genital HSV (herpes simplex virus) infection 03/13/2014   Chronic constipation 02/17/2014   Reflux gastritis 02/17/2014   Adenomatous polyp of descending colon 02/12/2012    Conditions to be addressed/monitored per PCP order:   Stress management  Care Plan : LCSW Plan of Care  Updates made by Gustavus Bryant, LCSW since 01/31/2023 12:00 AM     Problem: Coping Skills (General Plan of Care)      Goal: Coping Skills Enhanced   Start Date: 12/20/2022  Priority: High  Note:   Priority: High  Timeframe:  Short-Range Goal Priority:  High Start Date:   12/20/22               Expected End Date:  ongoing                     Follow Up Date--02/19/23 at 2 pm  - keep 90 percent of scheduled appointments -consider counseling or psychiatry -consider bumping up your self-care  -consider creating a stronger support network   Why is this important?             Combatting depression may take some time.            If you don't feel better right away, don't give up on your treatment plan.    Current barriers:   Chronic Mental Health needs related to stress and anxiety regarding managing her health and new normal. Patient requires Support, Education, Resources, Referrals, Advocacy, and Care Coordination, in order to meet Unmet Mental Health Needs To Find a Therapist.  Patient will implement clinical interventions discussed today to decrease symptoms of depression and increase knowledge and/or ability of: coping skills. Mental Health Concerns and Social Isolation Patient lacks knowledge of available community counseling agencies and resources.  Clinical Goal(s): verbalize understanding of plan for management of Anxiety, Depression, and Stress  and demonstrate a reduction in symptoms. Patient will connect with a provider for ongoing mental health treatment, increase coping skills, healthy habits, self-management skills, and stress reduction        Clinical Interventions:  Assessed patient's previous and current treatment, coping skills, support system and barriers to care. Patient provided hx. Patient and spouse live together. Patient reports that her spouse is very supportive. Verbalization of feelings encouraged, motivational interviewing employed Emotional support provided, positive coping strategies explored. Establishing healthy boundaries emphasized and healthy self-care education provided Patient was educated on available mental health resources within their area that accept Medicaid and offer counseling and psychiatry. Patient was advised to contact the back of her insurance card for assistance with benefits as well. Patient educated on the difference between therapy and psychiatry per patient request Email sent to patient today with available mental health resources within her area that accept Medicaid and offer the services that she is interested in.  Patient prefers talk therapy ONLY at this time and is agreeable to referral to [facility] for in person therapy with a female counselor as this location is right near her residence.  Emotional support  provided. CBT intervention implemented regarding "being mentally fit" by combating negative thinking and replacing it with uplifting support, hope and positivity. Patient reports significant worsening anxiety impacting her ability to function appropriately and carry out daily task. Assessed social determinant of health barriers LCSW provided education on relaxation techniques such as meditation, deep breathing, massage, grounding exercises or yoga that can activate the body's relaxation response and ease symptoms of stress and anxiety. LCSW ask that when pt is struggling with  difficult emotions and racing thoughts that they start this relaxation response process. LCSW provided extensive education on healthy coping skills for anxiety. SW used active and reflective listening, validated patient's feelings/concerns, and provided emotional support. Patient will work on implementing appropriate self-care habits into their daily routine such as: staying positive, writing a gratitude list, drinking water, staying active around the house, taking their medications as prescribed, combating negative thoughts or emotions and staying connected with their family and friends. Positive reinforcement provided for this decision to work on this.  Motivational Interviewing employed Depression screen reviewed  PHQ2/ PHQ9 completed or reviewed  Mindfulness or Relaxation training provided Active listening / Reflection utilized  Advance Care and HCPOA education provided Emotional Support Provided Problem Solving /Task Center strategies reviewed Provided psychoeducation for mental health needs  Provided brief CBT  Reviewed mental health medications and discussed importance of compliance:  Quality of sleep assessed & Sleep Hygiene techniques promoted  Participation in counseling encouraged  Verbalization of feelings encouraged  Suicidal Ideation/Homicidal Ideation assessed: Patient denies SI/HI  Review resources, discussed options and provided patient information about  Mental Health Resources Inter-disciplinary care team collaboration (see longitudinal plan of care) Update- Patient reports that she has not received a call from [facility] but she is not entirely sure. [facility] LCSW placed joint call to [facility] and was informed that pt is currently on the wait list for in person therapy and should be getting a call within 4-10 weeks to set up her initial therapy appointment and at that time she will be responsible for completing their electronic intake paperwork. Patient physically  wrote down [facility]'s number in case she wishes to check on her status on the wait list over the next few weeks.01/31/23 update- Patient reports that she has been successfully contacted by [facility] and was informed that they have a wait list of 10 weeks and was advised to consider [facility]. She reports that she has been in contact with this agency and is going to schedule her initial appointment once she hears back from office. Patient declined needing their contact information as she already has it written down. [facility] LCSW will follow up within 30 days to ensure therapy was successfully established.   Patient Goals/Self-Care Activities: Take medications as prescribed   Attend all scheduled provider appointments Call pharmacy for medication refills 3-7 days in advance of running out of medications Perform all self care activities independently  Perform IADL's (shopping, preparing meals, housekeeping, managing finances) independently Call provider office for new concerns or questions Work with the social worker to address care coordination needs and will continue to work with the clinical team to address health care and disease management related needs call 1-800-273-TALK (toll free, 24 hour hotline) go to [facility] [address] [place] [phone]) if an urgent crisis arises  call 911 if experiencing a Mental Health or Behavioral Health Crisis  Utilize healthy coping skills and supportive resources discussed Contact PCP with any questions or concerns  Keep 90 percent of counseling appointments Call your insurance provider for more information about your Enhanced Benefits  Check out counseling resources provided  Begin personal counseling with LCSW, to reduce and manage symptoms of Depression and Stress, until well-established with mental health provider Accept all calls from representative with [facility] in an effort  to establish ongoing mental health counseling and supportive services. Incorporate into daily practice - relaxation techniques, deep breathing exercises, and mindfulness meditation strategies. Talk about feelings with friends, family members, spiritual advisor, etc. Contact LCSW directly [phone]), if you have questions, need assistance, or if additional social work needs are identified between now and our next scheduled telephone outreach call. Call 988 for mental health hotline/crisis line if needed (24/7 available) Try techniques to reduce symptoms of anxiety/negative thinking (deep breathing, distraction, positive self talk, etc)  - develop a personal safety plan - develop a plan to deal with triggers like holidays, anniversaries - exercise at least 2 to 3 times per week - have a plan for how to handle bad days - journal feelings and what helps to feel better or worse - spend time or talk with others at least 2 to 3 times per week - watch for early signs of feeling worse - begin personal counseling - call and visit an old friend - check out volunteer opportunities - join a support group - laugh; watch a funny movie or comedian - learn and use visualization or guided imagery - perform a random act of kindness - practice relaxation or meditation daily - start or continue a personal journal - practice positive thinking and self-talk -continue with compliance of taking medication  -identify current effective and ineffective coping strategies.  -implement positive self-talk in care to increase self-esteem, confidence and feelings of control.  -consider alternative and complementary therapy approaches such as meditation, mindfulness or yoga.  Call your insurance provider to gain education on benefits if desired Call your primary care doctor if symptoms get worse  -journaling, prayer, worship services, meditation or pastoral counseling.  -increase participation in pleasurable group  activities such as hobbies, singing, sports or volunteering).  -consider the use of meditative movement therapy such as tai chi, yoga or qigong.  -start a regular daily exercise program based on tolerance, ability and patient choice to support positive thinking and activity    Follow Up Plan:  The patient has been provided with contact information for the care management team and has been advised to call with any mental health or health related questions or concerns. The care management team will reach out to the patient again over the next 30 business  days.   If you are experiencing a Mental Health or Behavioral Health Crisis or need someone to talk to, please call the Suicide and Crisis Lifeline: 988    Patient Goals: Follow up goal      Follow up:  Patient agrees to Care Plan and Follow-up.  Plan: The Managed Medicaid care management team will reach out to the patient again over the next 30 days.  Dickie La, BSW, MSW, [employer] Managed Medicaid LCSW [facility]  [facility] [email or web address] Phone: [phone]

## 2023-01-31 NOTE — Patient Instructions (Signed)
Visit Information  Julia Stark was given information about Medicaid Managed Care team care coordination services as a part of their Healthy [employer] Medicaid benefit. Julia Stark verbally consented to engagement with the [facility] Managed Care team.   If you are experiencing a medical emergency, please call 911 or report to your local emergency department or urgent care.   If you have a non-emergency medical problem during routine business hours, please contact your provider's office and ask to speak with a nurse.   For questions related to your Healthy [facility] health plan, please call: [phone] or visit the homepage here: [email or web address]  If you would like to schedule transportation through your Healthy [facility] plan, please call the following number at least 2 days in advance of your appointment: [phone]  For information about your ride after you set it up, call Ride Assist at [phone]. Use this number to activate a Will Call pickup, or if your transportation is late for a scheduled pickup. Use this number, too, if you need to make a change or cancel a previously scheduled reservation.  If you need transportation services right away, call [phone]. The after-hours call center is staffed 24 hours to handle ride assistance and urgent reservation requests (including discharges) 365 days a year. Urgent trips include sick visits, hospital discharge requests and life-sustaining treatment.  Call the [facility] Line at [phone], at any time, 24 hours a day, 7 days a week. If you are in danger or need immediate medical attention call 911.  If you would like help to quit smoking, call 1-800-QUIT-NOW (938-745-8333) OR Espaol: 1-855-Djelo-Ya (3-762-831-5176) o para ms informacin haga clic aqu or Text READY to 160-737 to register via text  Following is a copy of your plan of care:  Care Plan : LCSW Plan of Care   Updates made by Gustavus Bryant, LCSW since 01/31/2023 12:00 AM     Problem: Coping Skills (General Plan of Care)      Goal: Coping Skills Enhanced   Start Date: 12/20/2022  Priority: High  Note:   Priority: High  Timeframe:  Short-Range Goal Priority:  High Start Date:   12/20/22               Expected End Date:  ongoing                     Follow Up Date--02/19/23 at 2 pm  - keep 90 percent of scheduled appointments -consider counseling or psychiatry -consider bumping up your self-care  -consider creating a stronger support network   Why is this important?             Combatting depression may take some time.            If you don't feel better right away, don't give up on your treatment plan.    Current barriers:   Chronic Mental Health needs related to stress and anxiety regarding managing her health and new normal. Patient requires Support, Education, Resources, Referrals, Advocacy, and Care Coordination, in order to meet Unmet Mental Health Needs To Find a Therapist.  Patient will implement clinical interventions discussed today to decrease symptoms of depression and increase knowledge and/or ability of: coping skills. Mental Health Concerns and Social Isolation Patient lacks knowledge of available community counseling agencies and resources.  Clinical Goal(s): verbalize understanding of plan for management of Anxiety, Depression, and Stress and demonstrate a reduction in symptoms. Patient will connect with a provider for ongoing  mental health treatment, increase coping skills, healthy habits, self-management skills, and stress reduction        Patient Goals/Self-Care Activities: Take medications as prescribed   Attend all scheduled provider appointments Call pharmacy for medication refills 3-7 days in advance of running out of medications Perform all self care activities independently  Perform IADL's (shopping, preparing meals, housekeeping, managing finances)  independently Call provider office for new concerns or questions Work with the social worker to address care coordination needs and will continue to work with the clinical team to address health care and disease management related needs call 1-800-273-TALK (toll free, 24 hour hotline) go to [facility] [address] [place] [phone]) if an urgent crisis arises  call 911 if experiencing a Mental Health or Behavioral Health Crisis  Utilize healthy coping skills and supportive resources discussed Contact PCP with any questions or concerns Keep 90 percent of counseling appointments Call your insurance provider for more information about your Enhanced Benefits  Check out counseling resources provided  Begin personal counseling with LCSW, to reduce and manage symptoms of Depression and Stress, until well-established with mental health provider Accept all calls from representative with [employer] in an effort to establish ongoing mental health counseling and supportive services. Incorporate into daily practice - relaxation techniques, deep breathing exercises, and mindfulness meditation strategies. Talk about feelings with friends, family members, spiritual advisor, etc. Contact LCSW directly [phone]), if you have questions, need assistance, or if additional social work needs are identified between now and our next scheduled telephone outreach call. Call 988 for mental health hotline/crisis line if needed (24/7 available) Try techniques to reduce symptoms of anxiety/negative thinking (deep breathing, distraction, positive self talk, etc)  - develop a personal safety plan - develop a plan to deal with triggers like holidays, anniversaries - exercise at least 2 to 3 times per week - have a plan for how to handle bad days - journal feelings and what helps to feel better or worse - spend time or talk with others at least 2 to 3 times per week -  watch for early signs of feeling worse - begin personal counseling - call and visit an old friend - check out volunteer opportunities - join a support group - laugh; watch a funny movie or comedian - learn and use visualization or guided imagery - perform a random act of kindness - practice relaxation or meditation daily - start or continue a personal journal - practice positive thinking and self-talk -continue with compliance of taking medication  -identify current effective and ineffective coping strategies.  -implement positive self-talk in care to increase self-esteem, confidence and feelings of control.  -consider alternative and complementary therapy approaches such as meditation, mindfulness or yoga.  Call your insurance provider to gain education on benefits if desired Call your primary care doctor if symptoms get worse  -journaling, prayer, worship services, meditation or pastoral counseling.  -increase participation in pleasurable group activities such as hobbies, singing, sports or volunteering).  -consider the use of meditative movement therapy such as tai chi, yoga or qigong.  -start a regular daily exercise program based on tolerance, ability and patient choice to support positive thinking and activity    Follow Up Plan:  The patient has been provided with contact information for the care management team and has been advised to call with any mental health or health related questions or concerns. The care management team will reach out to the patient again over the  next 30 business  days.   If you are experiencing a Mental Health or Behavioral Health Crisis or need someone to talk to, please call the Suicide and Crisis Lifeline: 988    Patient Goals: Follow up goal  The following coping skill education was provided for stress relief and mental health management: "When your car dies or a deadline looms, how do you respond? Long-term, low-grade or acute stress takes a serious  toll on your body and mind, so don't ignore feelings of constant tension. Stress is a natural part of life. However, too much stress can harm our health, especially if it continues every day. This is chronic stress and can put you at risk for heart problems like heart disease and depression. Understand what's happening inside your body and learn simple coping skills to combat the negative impacts of everyday stressors.  Types of Stress There are two types of stress: Emotional - types of emotional stress are relationship problems, pressure at work, financial worries, experiencing discrimination or having a major life change. Physical - Examples of physical stress include being sick having pain, not sleeping well, recovery from an injury or having an alcohol and drug use disorder. Fight or Flight Sudden or ongoing stress activates your nervous system and floods your bloodstream with adrenaline and cortisol, two hormones that raise blood pressure, increase heart rate and spike blood sugar. These changes pitch your body into a fight or flight response. That enabled our ancestors to outrun saber-toothed tigers, and it's helpful today for situations like dodging a car accident. But most modern chronic stressors, such as finances or a challenging relationship, keep your body in that heightened state, which hurts your health. Effects of Too Much Stress If constantly under stress, most of [place] will eventually start to function less well.  Multiple studies link chronic stress to a higher risk of heart disease, stroke, depression, weight gain, memory loss and even premature death, so it's important to recognize the warning signals. Talk to your doctor about ways to manage stress if you're experiencing any of these symptoms: Prolonged periods of poor sleep. Regular, severe headaches. Unexplained weight loss or gain. Feelings of isolation, withdrawal or worthlessness. Constant anger and irritability. Loss of  interest in activities. Constant worrying or obsessive thinking. Excessive alcohol or drug use. Inability to concentrate.  10 Ways to Cope with Chronic Stress It's key to recognize stressful situations as they occur because it allows you to focus on managing how you react. We all need to know when to close our eyes and take a deep breath when we feel tension rising. Use these tips to prevent or reduce chronic stress. 1. Rebalance Work and Home All work and no play? If you're spending too much time at the office, intentionally put more dates in your calendar to enjoy time for fun, either alone or with others. 2. Get Regular Exercise Moving your body on a regular basis balances the nervous system and increases blood circulation, helping to flush out stress hormones. Even a daily 20-minute walk makes a difference. Any kind of exercise can lower stress and improve your mood ? just pick activities that you enjoy and make it a regular habit. 3. Eat Well and Limit Alcohol and Stimulants Alcohol, nicotine and caffeine may temporarily relieve stress but have negative health impacts and can make stress worse in the long run. Well-nourished bodies cope better, so start with a good breakfast, add more organic fruits and vegetables for a well-balanced diet, avoid processed  foods and sugar, try herbal tea and drink more water. 4. Connect with Supportive People Talking face to face with another person releases hormones that reduce stress. Lean on those good listeners in your life. 5. Carve Out Hobby Time Do you enjoy gardening, reading, listening to music or some other creative pursuit? Engage in activities that bring you pleasure and joy; research shows that reduces stress by almost half and lowers your heart rate, too. 6. Practice Meditation, Stress Reduction or Yoga Relaxation techniques activate a state of restfulness that counterbalances your body's fight-or-flight hormones. Even if this also means a  10-minute break in a long day: listen to music, read, go for a walk in nature, do a hobby, take a bath or spend time with a friend. Also consider doing a mindfulness exercise or try a daily deep breathing or imagery practice. Deep Breathing Slow, calm and deep breathing can help you relax. Try these steps to focus on your breathing and repeat as needed. Find a comfortable position and close your eyes. Exhale and drop your shoulders. Breathe in through your nose; fill your lungs and then your belly. Think of relaxing your body, quieting your mind and becoming calm and peaceful. Breathe out slowly through your nose, relaxing your belly. Think of releasing tension, pain, worries or distress. Repeat steps three and four until you feel relaxed. Imagery This involves using your mind to excite the senses -- sound, vision, smell, taste and feeling. This may help ease your stress. Begin by getting comfortable and then do some slow breathing. Imagine a place you love being at. It could be somewhere from your childhood, somewhere you vacationed or just a place in your imagination. Feel how it is to be in the place you're imagining. Pay attention to the sounds, air, colors, and who is there with you. This is a place where you feel cared for and loved. All is well. You are safe. Take in all the smells, sounds, tastes and feelings. As you do, feel your body being nourished and healed. Feel the calm that surrounds you. Breathe in all the good. Breathe out any discomfort or tension. 7. Sleep Enough If you get less than seven to eight hours of sleep, your body won't tolerate stress as well as it could. If stress keeps you up at night, address the cause, and add extra meditation into your day to make up for the lost z's. Try to get seven to nine hours of sleep each night. Make a regular bedtime schedule. Keep your room dark and cool. Try to avoid computers, TV, cell phones and tablets before bed. 8. Bond with  Connections You Enjoy Go out for a coffee with a friend, chat with a neighbor, call a family member, visit with a clergy member, or even hang out with your pet. Clinical studies show that spending even a short time with a companion animal can cut anxiety levels almost in half. 9. Take a Vacation Getting away from it all can reset your stress tolerance by increasing your mental and emotional outlook, which makes you a happier, more productive person upon return. Leave your cellphone and laptop at home! 10. See a Counselor, Coach or Therapist If negative thoughts overwhelm your ability to make positive changes, it's time to seek professional help. Make an appointment today--your health and life are worth it."     24- Hour Availability:    [facility]  [address] [place], [place] [employer] [phone] Crisis [phone]  [facility] [phone]  [facility] Crisis Service  [phone]    [facility] Crisis Services  [phone] (after hours)   Therapeutic Alternative/Mobile Crisis   [phone]   [place] National Suicide Hotline  (504)583-5185 Len Childs) [place] [address]   Call 8607939484 for mental health emergencies   [facility]  [phone]);  [place] and [employer]  [phone]); [place], [place], [place], [place], [place], [place], [place]    [facility] for [facility] Residents For 24/7 walk-up access to mental health services for [facility] children (4+), adolescents and adults, please visit the [facility] located at [address] in [place], [place].  *[place] also provides comprehensive outpatient behavioral health services in a variety of locations around the [place].  Connect With [place] [address] [place], [place] [address] HelpLine: 2168381513 or 1-(901) 761-4619  Get Directions  Find Help 24/7 By Phone Call our  24-hour HelpLine at (325) 706-7361 or 4053751452 for immediate assistance for mental health and substance abuse issues.  Walk-In Help [place]: [facility] (Ages 4 and Up) [place]: Emergency Dept., [facility] Additional Resources National Hopeline Network: 1-800-SUICIDE The National Suicide Prevention Lifeline: 1-800-273-TALK     Dickie La, BSW, MSW, LCSW Managed Medicaid LCSW [facility]  [facility] [email or web address] Phone: [phone]

## 2023-01-31 NOTE — Telephone Encounter (Signed)
Pt spoke with Pamalee Leyden, RN regarding sx. Pt scheduled to come in for evaluation tomorrow 02/01/23 @ 9:45 AM.

## 2023-02-01 ENCOUNTER — Ambulatory Visit: Payer: Medicaid Other | Attending: Cardiology | Admitting: Cardiology

## 2023-02-01 ENCOUNTER — Encounter: Payer: Self-pay | Admitting: Cardiology

## 2023-02-01 ENCOUNTER — Other Ambulatory Visit: Payer: Self-pay | Admitting: Cardiology

## 2023-02-01 VITALS — BP 130/101 | HR 70 | Resp 20 | Wt 154.0 lb

## 2023-02-01 DIAGNOSIS — I5032 Chronic diastolic (congestive) heart failure: Secondary | ICD-10-CM | POA: Diagnosis not present

## 2023-02-01 MED ORDER — TORSEMIDE 40 MG PO TABS: 80.0000 mg | ORAL_TABLET | Freq: Every day | ORAL | 3 refills | Status: DC

## 2023-02-01 MED ORDER — POTASSIUM CHLORIDE 20 MEQ PO PACK: PACK | ORAL | 6 refills | Status: DC

## 2023-02-01 NOTE — Patient Instructions (Addendum)
Medication Changes:  Start taking Potassium 20 mEq (1 tablet) daily.    Use Furoscix  80 mg  kit  daily for 3 DAYS only.   THEN Torsemide 80 mg daily after that.   Lab Work:  Labs done today, your results will be available in MyChart, we will contact you for abnormal readings.  You will have labs again in 1 week. Please come to the [facility] entrance before 4:30 pm to have your labs completed.   Special Instructions // Education:  Do the following things EVERYDAY: Weigh yourself in the morning before breakfast. Write it down and keep it in a log. Take your medicines as prescribed Eat low salt foods--Limit salt (sodium) to 2000 mg per day.  Stay as active as you can everyday Limit all fluids for the day to less than 2 liters   Follow-Up in: in 1 week.    If you have any questions or concerns before your next appointment please send [place] a message through [place] or call our office at [phone] Monday-Friday 8 am-5 pm.   If you have an urgent need after hours on the weekend please call your Primary Cardiologist or the [facility] in [place] at [phone].   At the [facility], you and your health needs are our priority. We have a designated team specialized in the treatment of Heart Failure. This Care Team includes your primary Heart Failure Specialized Cardiologist (physician), Advanced Practice Providers (APPs- Physician Assistants and Nurse Practitioners), and Pharmacist who all work together to provide you with the care you need, when you need it.   You may see any of the following providers on your designated Care Team at your next follow up:  Dr. [name] Dr. Marca Ancona Dr. Dorthula Nettles Dr. Theresia Bough Tonye Becket, NP Robbie Lis, [place] [address] [place], [place] Brynda Peon, NP [place] Lee, NP [name], NP Enos Fling, PharmD

## 2023-02-01 NOTE — Progress Notes (Signed)
PCP: [name], MD EP: Dr Maisie Fus ([facility]) HF Cardiology: Dr. [name]  62 y.o. with history of permanent atrial fibrillation, diastolic CHF, and diabetes was referred for CHF MD evaluation by [name]. Patient has a long history of atrial fibrillation.  She has failed multiple anti-arrhythmic agents (flecainide, amiodarone, dronedarone, and propafenone).  She had an initial ablation in 5/21, then a redo ablation in 8/22.  At the time of 8/22 ablation, foci of atrial tachycardia were noted that were not ablated due to proximity to the AV node.  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  She had ongoing atrial arrhythmia episodes and underwent convergent endocardial/epicardial ablation at [facility] in 9/23 with left atrial appendage clipping.  This was complicated by ileus and then left hemothorax.  She developed a recurrent left pleural effusion and ended up with left VATS in 10/23.  Atrial fibrillation recurred, and ultimately, she had AV nodal ablation with [facility] [employer] DDD pacemaker with left bundle lead placed.    Since her surgery, she has had chronic pain radiating from left lower chest to upper back. Primarily located at the site where she had a chest tube at time of hemothorax.  Pain is pleuritic.  She was started on colchicine which initially helped but eventually stopped helping and she has stopped the medication.    She has been chronically short of breath since her surgery in 9/23.  Hemodynamic RHC/LHC in 4/24 showed no evidence for ventricular interdependence.  This was not suggestive of constrictive pericarditis.  Echo in 4/24 showed EF 50-55%, moderate RV dysfunction, moderate RV enlargement, PASP 46 mmHg, mild-moderate TR; no evidence for constrictive pericarditis.   She has had dysphagia and has been diagnosed by GI with esophageal dysmotility. She tried a medication for esophageal dysmotility but it caused GI upset so she stopped it.   She was  admitted at [facility] in 8/24 with weight loss and dysphagia.  She was found to have abnormal esophageal manometry and had pharyngeal muscle weakness with aspiration.  She also had diaphragmatic fluoroscopy showing abnormally decreased excursion, worse on left. No achalasia.  She had a G tube placed and is getting tube feeds at home now.  She was in the hospital for about 3 wks.   CT chest in 9/24 showed right pleural effusion with bibasilar atelectasis.   Echo in 10/24 showed EF 60-65%, moderate asymmetric septal hypertrophy, D-shaped septum suggestive of RV pressure/volume overload, moderate RV enlargement with mild RV systolic dysfunction, PASP 53 mmHg, mild MR, mild-moderate TR.   She returns for followup of CHF.  She continues to get tube feeds due to dysphagia and aspiration.  Weight up 11 lbs. [name] using Bipap at night and oxygen 2L by [place] during the day.  She was volume overloaded at last appointment but did not increase torsemide to 40 qam/20 qpm as recommended due to it keeping her up at night urinating. [name] now short of breath walking around the house with orthopnea and PND.  No chest pain, no palpitations or syncope.   Labs (3/24): K 4.1, creatinine 0.55, BNP 299 Labs (4/24): K 3.5, creatinine 0.65, BNP 339 Labs (7/24): K 3.7, creatinine 0.55, LFTs normal except for alkaline phosphatase 191, BNP 128, hgb 14.9 Labs (10/24): K 3.8, creatinine 0.35  ECG (Personally reviewed): atrial fibrillation with left bundle pacing  PMH: 1. Atrial fibrillation/atrial flutter:  Now permanent. Tried and failed flecainide, amiodarone, dronedarone, and propafenone.  - AF ablation 5/21.   -  AF ablation 8/22.  - Convergent epicardial/endocardial ablation with LA appendage clipping in 9/23.  Complicated by left hemothorax and then recurrent left pleural effusion, had left VATS in 10/23.  - AV nodal ablation with [employer] DDD pacemaker with left bundle lead placed 2/24.  2. Atrial tachycardia: Noted  at time of AF ablation in 8/22.  Not ablated, near AV node.  3. 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  4. Chronic diastolic CHF with prominent RV dysfunction: Cardiac MRI in 2/21 showed EF 61%, normal RV, no LGE.  - Cardiolite (3/22): No ischemia/infarction.  - Echo (10/23) with EF 50%, normal RV, moderate TR.  - Echo (4/24): EF 50-55%, moderate RV dysfunction, moderate RV enlargement, PASP 46 mmHg, mild-moderate TR; no evidence for constrictive pericarditis.  - Hemodynamic LHC/RHC (4/24): mean RA 6, PA 54/27 mean 31, mean PCWP 9, CI 3.51, PVR 3.5 WU; no ventricular interdependence noted.  - Echo (10/24): EF 60-65%, moderate asymmetric septal hypertrophy, D-shaped septum suggestive of RV pressure/volume overload, moderate RV enlargement with mild RV systolic dysfunction, PASP 53 mmHg, mild MR, mild-moderate TR.  5. HTN 6. Type 2 diabetes 7. Depression 8. Hyperlipidemia 9. OSA: Mild-moderate 10. Left hemothorax: Complication of convergent procedure.  Developed recurrent pleural effusions, had left VATS in 10/23.   11. Esophageal dysmotility 12. Reduced diaphragmatic excursion: Noted on diaphragmatic fluoroscopy at [facility] in 8/24. 13. Oropharyngeal muscle weakness with aspiration: Has G tube.   Social History   Socioeconomic History   Marital status: Divorced    Spouse name: Not on file   Number of children: 3   Years of education: 12   Highest education level: Some college, no degree  Occupational History    Employer: [employer]    Comment: in special chemistry  Tobacco Use   Smoking status: Former    Types: Cigarettes   Smokeless tobacco: Never  Vaping Use   Vaping status: Never Used  Substance and Sexual Activity   Alcohol use: Not Currently    Alcohol/week: 4.0 standard drinks of alcohol    Types: 2 Glasses of wine, 2 Cans of beer per week   Drug use: No   Sexual activity: Yes    Partners: Male    Birth control/protection: Post-menopausal, Surgical  Other Topics  Concern   Not on file  Social History Narrative   Not on file   Social Determinants of Health   Financial Resource Strain: Low Risk  (12/01/2022)   Received from [facility]   Overall Financial Resource Strain (CARDIA)    Difficulty of Paying Living Expenses: Not very hard  Recent Concern: Financial Resource Strain - Medium Risk (12/01/2022)   Overall Financial Resource Strain (CARDIA)    Difficulty of Paying Living Expenses: Somewhat hard  Food Insecurity: No Food Insecurity (01/29/2023)   Hunger Vital Sign    Worried About Running Out of Food in the Last Year: Never true    Ran Out of Food in the Last Year: Never true  Transportation Needs: No Transportation Needs (01/29/2023)   PRAPARE - Administrator, Civil Service (Medical): No    Lack of Transportation (Non-Medical): No  Physical Activity: Inactive (12/06/2022)   Exercise Vital Sign    Days of Exercise per Week: 0 days    Minutes of Exercise per Session: 0 min  Stress: Stress Concern Present (01/31/2023)   [employer] of Occupational Health - Occupational Stress Questionnaire    Feeling of Stress : Rather much  Social  Connections: Socially Isolated (10/02/2022)   Social Connection and Isolation Panel [NHANES]    Frequency of Communication with Friends and Family: More than three times a week    Frequency of Social Gatherings with Friends and Family: Once a week    Attends Religious Services: Never    Database administrator or Organizations: No    Attends Engineer, structural: Not on file    Marital Status: Divorced  Intimate Partner Violence: Not At Risk (12/14/2022)   Humiliation, Afraid, Rape, and Kick questionnaire    Fear of Current or Ex-Partner: No    Emotionally Abused: No    Physically Abused: No    Sexually Abused: No   Family History  Problem Relation Age of Onset   Diabetes Mother    Hypertension Mother    Skin cancer Mother    Depression Mother    Alzheimer's  disease Father    Diabetes Sister    Depression Sister    Diabetes Brother    Colon cancer Maternal Uncle    Hypertension Maternal Grandfather    Hyperlipidemia Maternal Grandfather    Diabetes Paternal Grandmother    Heart disease Paternal Grandmother    Prostate cancer Paternal Grandfather    Ovarian cancer Cousin    Breast cancer Neg Hx    Cervical cancer Neg Hx    ROS: All systems reviewed and negative except as per HPI.   Current Outpatient Medications  Medication Sig Dispense Refill   acetaminophen (TYLENOL) 160 MG/5ML liquid Take 500 mg by mouth every 4 (four) hours as needed for fever or pain.     apixaban (ELIQUIS) 5 MG TABS tablet Take 1 tablet (5 mg total) by mouth 2 (two) times daily. 60 tablet 6   bacitracin 500 UNIT/GM ointment Apply 1 Application topically 2 (two) times daily. 15 g 0   linaclotide (LINZESS) 72 MCG capsule Take 1 capsule (72 mcg total) by mouth daily as needed. 30 capsule 5   melatonin 5 MG TABS Take 5 mg by mouth at bedtime.     metoCLOPramide (REGLAN) 5 MG/5ML solution Place 2.5 mLs (2.5 mg total) into feeding tube 2 (two) times daily. 120 mL 5   Multiple Vitamins-Minerals (TROPICAL LIQUID NUTRITION) LIQD Take 15 mLs by G tube once daily     omeprazole (KONVOMEP) 2 mg/mL SUSP oral suspension Take 10 mLs (20 mg total) by mouth 2 (two) times daily. 900 mL 5   polyethylene glycol powder (GLYCOLAX/MIRALAX) 17 GM/SCOOP powder Take 17 g by mouth 2 (two) times daily as needed. 850 g 1   rosuvastatin (CRESTOR) 5 MG tablet Take 1 tablet (5 mg total) by mouth every other day. 45 tablet 1   spironolactone (ALDACTONE) 25 MG tablet Take 1 tablet (25 mg total) by mouth daily. 90 tablet 3   venlafaxine (EFFEXOR) 75 MG tablet Take 1 tablet (75 mg total) by mouth 2 (two) times daily with a meal. 180 tablet 1   Furosemide (FUROSCIX) 80 MG/10ML CTKT Inject 1 Cartridge into the skin as directed. (Patient not taking: Reported on 02/01/2023)     potassium chloride (KLOR-CON)  20 MEQ packet 1 Packet (20 mEq) daily . 30 each 6   Torsemide 40 MG TABS Take 80 mg by mouth daily at 6 (six) AM. 180 tablet 3   No current facility-administered medications for this visit.   BP (!) 130/101 (BP Location: Left Arm, Patient Position: Sitting, Cuff Size: Small)   Pulse 70   Resp 20  Wt 154 lb (69.9 kg)   LMP 04/13/2011   SpO2 100% Comment: 3 liter O2  BMI 23.42 kg/m  General: NAD Neck: JVP 14 cm, no thyromegaly or thyroid nodule.  Lungs: Clear to auscultation bilaterally with normal respiratory effort. CV: Nondisplaced PMI.  Heart regular S1/S2, no S3/S4, no murmur.  2+ edema to knees.  No carotid bruit.  Normal pedal pulses.  Abdomen: Soft, nontender, no hepatosplenomegaly, mild distention. G tube.  Skin: Intact without lesions or rashes.  Neurologic: Alert and oriented x 3.  Psych: Normal affect. Extremities: No clubbing or cyanosis.  HEENT: Normal.   Assessment/Plan: 1. Chronic diastolic CHF: Primarily RV failure.  Echo (10/23) with EF 50%, normal RV, moderate TR.  She has had pericardial-type symptoms with pleuritic chest pain on and off since 9/23 convergent procedure.  She had a left-sided VATS because of persistent pleural effusion. I had been concerned that she could have constrictive pericarditis post-cardiac surgery. However, echo in 4/24 showed EF 50-55%, moderate RV dysfunction, moderate RV enlargement, PASP 46 mmHg, mild-moderate TR; no evidence for constrictive pericarditis.  Hemodynamic LHC/RHC showed normal filling pressures with mild-moderate pulmonary hypertension and no evidence for ventricular interdependence.  So far, imaging has not been consistent with constrictive pericarditis.  Situation looks more like RV failure.  Echo in 10/24 showed EF 60-65%, moderate asymmetric septal hypertrophy, D-shaped septum suggestive of RV pressure/volume overload, moderate RV enlargement with mild RV systolic dysfunction, PASP 53 mmHg, mild MR, mild-moderate TR. She  remains symptomatic, NYHA class III.  I think a significant part of current dyspnea may be lung related (recurrent pleural effusions, s/p VATS, oropharyngeal muscle weakness with aspiration, reduced diaphragmatic excursion).  However, she looks significantly volume overloaded on exam with NYHA class III symptoms, weight is up.  - I wanted to get a cardiac MRI for better evaluation of the RV/reassessment of constrictive pericarditis, but she does not think she could tolerate an MRI (severe claustrophobia).   - I will have her take Furoscix 80 mg Herman daily x 3 days (no torsemide  on these days).  - After she completes Furoscix, I will increase torsemide to 80 mg daily and KCl and add KCl 20 daily.  BMET/BNP today, BMET in 7 days.   - Continue spironolactone 25 mg daily.  - She was not able to tolerate [place]. She does not remember why, notes reference UTIs.   2. Chronic hypoxemic respiratory failure: Also with OSA.  She has probable chronic aspiration, decreased diaphragmatic excursion, recurrent pleural effusions s/p VATS.  [name] on Bipap at night and 2 L oxygen at all times.  - Follows with pulmonology.  3. Atrial arrhythmias: Permanent AF.  She has history of atrial fibrillation, atrial flutter, atrial tachycardia, and AVNRT. Now with permanent atrial fibrillation despite 2 endocardial ablations and then a convergent endocardial/epicardial ablation.  She has failed multiple anti-arrhythmics as noted above. Now s/p AV nodal ablation with [facility] pacemaker with left bundle lead.   - Continue apixaban.  4. Pulmonary hypertension: Mild-moderate with PVR 3.5 WU on 4/24 RHC.  Suspect group 2 and group 3 (chronic hypoxemic respiratory failure and OSA).  Has significant RV failure.  - Continue home oxygen and Bipap.   - Do not think pulmonary vasodilators would be helpful. 5. Dysphagia: History of aspiration with pharyngeal muscle weakness.   - She now has a G tube and is getting tube feeds.  -  Follows with GI.  6. H/o recurrent left pleural effusion: Initially with left  hemothorax after convergent procedure, then developed recurrent pleural effusions and had VATS. 7. Left lateral chest pain: I think that this is neuropathic post-chest tube placement.  Pain originates in intercostal space where she had a chest tube.    Followup 1 week with APP  Marca Ancona 02/01/2023

## 2023-02-02 LAB — BASIC METABOLIC PANEL
BUN/Creatinine Ratio: 60 — ABNORMAL HIGH (ref 12–28)
BUN: 26 mg/dL (ref 8–27)
CO2: 40 mmol/L — ABNORMAL HIGH (ref 20–29)
Calcium: 9.6 mg/dL (ref 8.7–10.3)
Chloride: 91 mmol/L — ABNORMAL LOW (ref 96–106)
Creatinine, Ser: 0.43 mg/dL — ABNORMAL LOW (ref 0.57–1.00)
Glucose: 140 mg/dL — ABNORMAL HIGH (ref 70–99)
Potassium: 4.2 mmol/L (ref 3.5–5.2)
Sodium: 142 mmol/L (ref 134–144)
eGFR: 110 mL/min/{1.73_m2} (ref >59)

## 2023-02-02 LAB — BRAIN NATRIURETIC PEPTIDE: BNP: 189 pg/mL — ABNORMAL HIGH (ref 0.0–100.0)

## 2023-02-04 ENCOUNTER — Other Ambulatory Visit: Payer: Self-pay

## 2023-02-04 ENCOUNTER — Emergency Department (HOSPITAL_COMMUNITY): Payer: Medicaid Other

## 2023-02-04 ENCOUNTER — Emergency Department (HOSPITAL_COMMUNITY)
Admission: EM | Admit: 2023-02-04 | Discharge: 2023-02-04 | Disposition: A | Discharge status: Home / Self Care | Payer: Medicaid Other | Attending: Emergency Medicine | Admitting: Emergency Medicine

## 2023-02-04 ENCOUNTER — Encounter (HOSPITAL_COMMUNITY): Payer: Self-pay

## 2023-02-04 DIAGNOSIS — R600 Localized edema: Secondary | ICD-10-CM

## 2023-02-04 DIAGNOSIS — Z79899 Other long term (current) drug therapy: Secondary | ICD-10-CM | POA: Insufficient documentation

## 2023-02-04 DIAGNOSIS — I5033 Acute on chronic diastolic (congestive) heart failure: Secondary | ICD-10-CM | POA: Diagnosis not present

## 2023-02-04 DIAGNOSIS — R7989 Other specified abnormal findings of blood chemistry: Secondary | ICD-10-CM | POA: Diagnosis not present

## 2023-02-04 DIAGNOSIS — E119 Type 2 diabetes mellitus without complications: Secondary | ICD-10-CM | POA: Diagnosis not present

## 2023-02-04 DIAGNOSIS — I509 Heart failure, unspecified: Secondary | ICD-10-CM | POA: Diagnosis not present

## 2023-02-04 DIAGNOSIS — R109 Unspecified abdominal pain: Secondary | ICD-10-CM | POA: Diagnosis not present

## 2023-02-04 DIAGNOSIS — Z931 Gastrostomy status: Secondary | ICD-10-CM | POA: Diagnosis not present

## 2023-02-04 DIAGNOSIS — Z7901 Long term (current) use of anticoagulants: Secondary | ICD-10-CM | POA: Diagnosis not present

## 2023-02-04 DIAGNOSIS — I110 Hypertensive heart disease with heart failure: Secondary | ICD-10-CM | POA: Diagnosis not present

## 2023-02-04 DIAGNOSIS — R0602 Shortness of breath: Secondary | ICD-10-CM | POA: Diagnosis not present

## 2023-02-04 DIAGNOSIS — J90 Pleural effusion, not elsewhere classified: Secondary | ICD-10-CM | POA: Diagnosis not present

## 2023-02-04 DIAGNOSIS — I517 Cardiomegaly: Secondary | ICD-10-CM | POA: Diagnosis not present

## 2023-02-04 LAB — URINALYSIS, ROUTINE W REFLEX MICROSCOPIC
Bilirubin Urine: NEGATIVE
Glucose, UA: NEGATIVE mg/dL
Hgb urine dipstick: NEGATIVE
Ketones, ur: NEGATIVE mg/dL
Leukocytes,Ua: NEGATIVE
Nitrite: NEGATIVE
Protein, ur: NEGATIVE mg/dL
Specific Gravity, Urine: 1.013 (ref 1.005–1.030)
pH: 5 (ref 5.0–8.0)

## 2023-02-04 LAB — BASIC METABOLIC PANEL
Anion gap: 14 (ref 5–15)
BUN: 25 mg/dL — ABNORMAL HIGH (ref 8–23)
CO2: 39 mmol/L — ABNORMAL HIGH (ref 22–32)
Calcium: 9.6 mg/dL (ref 8.9–10.3)
Chloride: 85 mmol/L — ABNORMAL LOW (ref 98–111)
Creatinine, Ser: 0.42 mg/dL — ABNORMAL LOW (ref 0.44–1.00)
GFR, Estimated: >60 mL/min (ref >60)
Glucose, Bld: 207 mg/dL — ABNORMAL HIGH (ref 70–99)
Potassium: 3.6 mmol/L (ref 3.5–5.1)
Sodium: 138 mmol/L (ref 135–145)

## 2023-02-04 LAB — CBC
HCT: 29.5 % — ABNORMAL LOW (ref 36.0–46.0)
Hemoglobin: 8 g/dL — ABNORMAL LOW (ref 12.0–15.0)
MCH: 22.3 pg — ABNORMAL LOW (ref 26.0–34.0)
MCHC: 27.1 g/dL — ABNORMAL LOW (ref 30.0–36.0)
MCV: 82.2 fL (ref 80.0–100.0)
Platelets: 199 10*3/uL (ref 150–400)
RBC: 3.59 MIL/uL — ABNORMAL LOW (ref 3.87–5.11)
RDW: 16.6 % — ABNORMAL HIGH (ref 11.5–15.5)
WBC: 9.4 10*3/uL (ref 4.0–10.5)
nRBC: 0 % (ref 0.0–0.2)

## 2023-02-04 LAB — BRAIN NATRIURETIC PEPTIDE: B Natriuretic Peptide: 271.4 pg/mL — ABNORMAL HIGH (ref 0.0–100.0)

## 2023-02-04 MED ORDER — ACETAMINOPHEN 160 MG/5ML PO SOLN
500.0000 mg | Freq: Once | ORAL | Status: AC
  Administered 2023-02-04: 500 mg
  Filled 2023-02-04: qty 20.3

## 2023-02-04 MED ORDER — FUROSEMIDE 10 MG/ML IJ SOLN
40.0000 mg | Freq: Once | INTRAMUSCULAR | Status: AC
  Administered 2023-02-04: 40 mg via INTRAVENOUS
  Filled 2023-02-04: qty 4

## 2023-02-04 MED ORDER — TORSEMIDE 20 MG PO TABS: 80.0000 mg | ORAL_TABLET | Freq: Every day | ORAL | Status: DC

## 2023-02-04 MED ORDER — DICYCLOMINE HCL 10 MG/ML IM SOLN
20.0000 mg | Freq: Once | INTRAMUSCULAR | Status: AC
  Administered 2023-02-04: 20 mg via INTRAMUSCULAR
  Filled 2023-02-04: qty 2

## 2023-02-04 MED ORDER — METOCLOPRAMIDE HCL 5 MG/ML IJ SOLN
2.5000 mg | Freq: Once | INTRAMUSCULAR | Status: AC
  Administered 2023-02-04: 2.5 mg via INTRAVENOUS
  Filled 2023-02-04: qty 2

## 2023-02-04 NOTE — ED Triage Notes (Signed)
Patient reports shortness of breath, increased oxygen intake and swelling in the bilateral legs and feet and abdomen. Patient has been taking increased doses of her torsemide but continues to have the symptoms above. Patient is scheduled to see her cardiologist on Wednesday but states she cannot wait that long. Hx of CHF, Pacemaker Conservation officer, historic buildings), [employer]. Normally uses 2L at baseline. Is currently on 3L.

## 2023-02-04 NOTE — ED Provider Notes (Signed)
[place] EMERGENCY DEPARTMENT AT [facility] Provider Note   CSN: [id number] Arrival date & time: 02/04/23  0555     History  Chief Complaint  Patient presents with   Leg Swelling    Julia Stark is a 62 y.o. female with a history of CHF, atrial fibrillation, type 2 diabetes mellitus, and reduced diaphragmatic excursion who presents to the ED today for shortness of breath and leg swelling.  Patient reports worsening shortness of breath and lower leg swelling for the past week or so.  Patient is on 2L on supplemental oxygen at home during the day and then on BiPAP at night, she had to increase to 3L today.  He also endorses worsening lower extremity edema despite compression stocking use.  She was taking subcutaneous Furosemide (Furoscix) up until yesterday, but today she is supposed to start Torsemide, per her heart failure specialist Dr. [name].  She supposed to follow-up with him 3 days.  She has not taken any diuretic today.  Additionally, patient reports bloating in her abdomen.  She states that she taking Linzess which she has not taken today.  She states that she does not feel like she is urinating as much as normal either.  Endorses nausea but denies  vomiting or fever.  No other complaints or concerns at this time.    Home Medications Prior to Admission medications   Medication Sig Start Date End Date Taking? Authorizing Provider  acetaminophen (TYLENOL) 160 MG/5ML liquid Take 500 mg by mouth every 4 (four) hours as needed for fever or pain.    [provider]  apixaban (ELIQUIS) 5 MG TABS tablet Take 1 tablet (5 mg total) by mouth 2 (two) times daily. 07/29/22   Laurey Morale, MD  bacitracin 500 UNIT/GM ointment Apply 1 Application topically 2 (two) times daily. 01/23/23   [name], MD  Furosemide (FUROSCIX) 80 MG/10ML CTKT Inject 1 Cartridge into the skin as directed. Patient not taking: Reported on 02/01/2023 12/21/22   Delma Freeze, FNP   linaclotide [facility]) 72 MCG capsule Take 1 capsule (72 mcg total) by mouth daily as needed. 12/23/22   Simmons-Robinson, Makiera, MD  melatonin 5 MG TABS Take 5 mg by mouth at bedtime.    [provider]  metoCLOPramide (REGLAN) 5 MG/5ML solution Place 2.5 mLs (2.5 mg total) into feeding tube 2 (two) times daily. 12/23/22   Simmons-Robinson, Tawanna Cooler, MD  Multiple Vitamins-Minerals (TROPICAL LIQUID NUTRITION) LIQD Take 15 mLs by G tube once daily 12/01/22   [provider]  omeprazole (KONVOMEP) 2 mg/mL SUSP oral suspension Take 10 mLs (20 mg total) by mouth 2 (two) times daily. 12/23/22   Simmons-Robinson, Tawanna Cooler, MD  polyethylene glycol powder (GLYCOLAX/MIRALAX) 17 GM/SCOOP powder Take 17 g by mouth 2 (two) times daily as needed. 12/27/22   Jacky Kindle, FNP  potassium chloride (KLOR-CON) 20 MEQ packet 1 Packet (20 mEq) daily . 02/01/23   Laurey Morale, MD  rosuvastatin (CRESTOR) 5 MG tablet Take 1 tablet (5 mg total) by mouth every other day. 09/04/22   [name], MD  spironolactone (ALDACTONE) 25 MG tablet Take 1 tablet (25 mg total) by mouth daily. 09/25/22   Laurey Morale, MD  Torsemide 40 MG TABS Take 80 mg by mouth daily at 6 (six) AM. 02/01/23   Laurey Morale, MD  venlafaxine [facility]) 75 MG tablet Take 1 tablet (75 mg total) by mouth 2 (two) times daily with a meal. 01/02/23  [name], MD      Allergies    Acetazolamide, Amiodarone, Flecainide, Metoprolol, and Trazodone    Review of Systems   Review of Systems  Respiratory:  Positive for shortness of breath.   All other systems reviewed and are negative.   Physical Exam Updated Vital Signs BP (!) 115/50   Pulse 70   Temp 98.1 F (36.7 C) (Oral)   Resp (!) 24   Ht 5\' 7"  (1.702 m)   Wt 69.1 kg   LMP 04/13/2011   SpO2 98%   BMI 23.87 kg/m  Physical Exam Vitals and nursing note reviewed.  Constitutional:      Appearance: Normal appearance.  HENT:     Head: Normocephalic and  atraumatic.     Mouth/Throat:     Mouth: Mucous membranes are moist.  Eyes:     Conjunctiva/sclera: Conjunctivae normal.     Pupils: Pupils are equal, round, and reactive to light.  Cardiovascular:     Rate and Rhythm: Normal rate and regular rhythm.     Pulses: Normal pulses.  Pulmonary:     Effort: Pulmonary effort is normal.     Breath sounds: Normal breath sounds.  Abdominal:     Palpations: Abdomen is soft.     Tenderness: There is abdominal tenderness. There is no guarding or rebound.  Skin:    General: Skin is warm and dry.     Findings: No rash.  Neurological:     General: No focal deficit present.     Mental Status: [name] alert.     Motor: No weakness.  Psychiatric:        Mood and Affect: Mood normal.        Behavior: Behavior normal.     ED Results / Procedures / Treatments   Labs (all labs ordered are listed, but only abnormal results are displayed) Labs Reviewed  BASIC METABOLIC PANEL - Abnormal; Notable for the following components:      Result Value   Chloride 85 (*)    CO2 39 (*)    Glucose, Bld 207 (*)    BUN 25 (*)    Creatinine, Ser 0.42 (*)    All other components within normal limits  CBC - Abnormal; Notable for the following components:   RBC 3.59 (*)    Hemoglobin 8.0 (*)    HCT 29.5 (*)    MCH 22.3 (*)    MCHC 27.1 (*)    RDW 16.6 (*)    All other components within normal limits  BRAIN NATRIURETIC PEPTIDE - Abnormal; Notable for the following components:   B Natriuretic Peptide 271.4 (*)    All other components within normal limits  URINALYSIS, ROUTINE W REFLEX MICROSCOPIC    EKG EKG Interpretation Date/Time:  Sunday February 04 2023 06:14:53 EST Ventricular Rate:  70 PR Interval:  344 QRS Duration:  150 QT Interval:  444 QTC Calculation: 479 R Axis:   140  Text Interpretation: Atrial-sensed ventricular-paced rhythm with prolonged AV conduction Abnormal ECG When compared with ECG of 01-Feb-2023 11:06, PREVIOUS ECG IS PRESENT  Confirmed by Alvino Blood ([id number]) on 02/04/2023 7:06:48 AM  Radiology DG Abd 2 Views  Result Date: 02/04/2023 CLINICAL DATA:  Pain. EXAM: ABDOMEN - 2 VIEW COMPARISON:  CT of the chest 12/26/2022 FINDINGS: Known right pleural effusion. Percutaneous gastrostomy tube and pacer leads. Extensive artifact from EKG leads. The bowel gas pattern is normal and there is no abnormal stool retention. No concerning mass effect or  calcification. IMPRESSION: Normal bowel gas pattern. Electronically Signed   By: Tiburcio Pea M.D.   On: 02/04/2023 09:47   DG Chest 2 View  Result Date: 02/04/2023 CLINICAL DATA:  Shortness of breath.  History of CHF EXAM: CHEST - 2 VIEW COMPARISON:  11/03/2022 FINDINGS: Pleural fluid and pulmonary opacification at the right base, also seen on prior. Cardiomegaly with dual-chamber pacer leads and left atrial clipping. No pneumothorax. IMPRESSION: 1. Opacification at the right base primarily attributed to moderate pleural effusion, also seen on 12/26/2022 chest CT. 2. Chronic cardiomegaly. Electronically Signed   By: Tiburcio Pea M.D.   On: 02/04/2023 07:08    Procedures Procedures: not indicated.   Medications Ordered in ED Medications  metoCLOPramide (REGLAN) injection 2.5 mg (2.5 mg Intravenous Given 02/04/23 0813)  dicyclomine (BENTYL) injection 20 mg (20 mg Intramuscular Given 02/04/23 0812)  acetaminophen (TYLENOL) 160 MG/5ML solution 500 mg (500 mg Per Tube Given 02/04/23 0940)  furosemide (LASIX) injection 40 mg (40 mg Intravenous Given 02/04/23 0948)    ED Course/ Medical Decision Making/ A&P                                 Medical Decision Making Amount and/or Complexity of Data Reviewed Labs: ordered. Radiology: ordered.  Risk OTC drugs. Prescription drug management.   This patient presents to the ED for concern of leg swelling, this involves an extensive number of treatment options, and is a complaint that carries with it a high risk of complications  and morbidity.   Differential diagnosis includes: Acute heart failure exacerbation, pneumonia, URI, etc.   Comorbidities  See HPI above   Additional History  Additional history obtained from prior cardiology records.   Cardiac Monitoring / EKG  The patient was maintained on a cardiac monitor.  I personally viewed and interpreted the cardiac monitored which showed: atrial-sensed ventricular paced rhythm with prolonged AV conduction, heart rate of 70 bpm.   Lab Tests  I ordered and personally interpreted labs.  The pertinent results include:   Elevated BNP 271.4 BMP and CBC within normal limits per patient UA is unremarkable   Imaging Studies  I ordered imaging studies including CXR or abdominal x-ray I independently visualized and interpreted imaging which showed:  Chest x-ray was unchanged from prior - showed moderate right-sided pleural effusion Abdominal x-ray showed normal gas pattern Bladder scan showed 144 mL - she is able to urinate in the ED I agree with the radiologist interpretation   Problem List / ED Course / Critical Interventions / Medication Management  Shortness of breath I ordered medications including: Reglan for nausea IV Lasix for shortness of breath Tylenol for headache Dicyclomine for abdominal pain Reevaluation of the patient after these medicines showed that the patient improved I have reviewed the patients home medicines and have made adjustments as needed   Social Determinants of Health  Physical activity   Test / Admission - Considered  States that she is feeling much better after getting Lasix and feels comfortable going home.  [name] agreeable to start her for torsemide tomorrow and follow-up with her cardiologist on Wednesday (in 3 days) as scheduled. Discussed findings with patient.  All questions were answered. Return precautions provided.       Final Clinical Impression(s) / ED Diagnoses Final diagnoses:  Leg edema   Acute on chronic diastolic congestive heart failure (HCC)    Rx / DC Orders ED Discharge Orders  None         Maxwell Marion, PA-C 02/04/23 1547    Lonell Grandchild, MD 02/05/23 [phone]

## 2023-02-04 NOTE — Discharge Instructions (Addendum)
As discussed, your labs show an increased BNP of 271.4, which is increased from your last BNP, which was 188.7.  Otherwise your labs are within normal limits.  Start taking your torsemide tomorrow and follow-up with your cardiologist as scheduled on Wednesday.  Return to the ED if your symptoms worsen in the interim.

## 2023-02-05 ENCOUNTER — Telehealth: Payer: Self-pay

## 2023-02-05 NOTE — Telephone Encounter (Signed)
Agree, needs to go to ER.

## 2023-02-05 NOTE — Telephone Encounter (Signed)
Pt's son called to ask advice. He states patient completed 3 days of furoscix this weekend, without any significant change in swelling and shortness of breath. He reports that patient was in the ED yesterday and was given IV lasix with initial improvement in symptoms and sent home, but that patient has continued to have decreased urine output, and increased shortness of breath. He reports that this morning patient is extremely lethargic, and difficult to arouse.   Patient's son advised no provider is in office yet this morning, but RN advises to call 911 or go to emergency room for new altered mental status and worsening shortness of breath. He agreed with this plan and states they will go to emergency room. Forwarding message to the patient's providers to follow up with patient as needed.

## 2023-02-07 ENCOUNTER — Telehealth: Payer: Self-pay

## 2023-02-07 ENCOUNTER — Ambulatory Visit: Payer: Medicaid Other | Attending: Family | Admitting: Family

## 2023-02-07 ENCOUNTER — Encounter: Payer: Self-pay | Admitting: Family

## 2023-02-07 VITALS — BP 109/35 | HR 70 | Wt 153.0 lb

## 2023-02-07 DIAGNOSIS — I110 Hypertensive heart disease with heart failure: Secondary | ICD-10-CM | POA: Insufficient documentation

## 2023-02-07 DIAGNOSIS — K224 Dyskinesia of esophagus: Secondary | ICD-10-CM | POA: Insufficient documentation

## 2023-02-07 DIAGNOSIS — R1319 Other dysphagia: Secondary | ICD-10-CM

## 2023-02-07 DIAGNOSIS — J90 Pleural effusion, not elsewhere classified: Secondary | ICD-10-CM | POA: Diagnosis not present

## 2023-02-07 DIAGNOSIS — R1310 Dysphagia, unspecified: Secondary | ICD-10-CM | POA: Insufficient documentation

## 2023-02-07 DIAGNOSIS — I10 Essential (primary) hypertension: Secondary | ICD-10-CM

## 2023-02-07 DIAGNOSIS — I272 Pulmonary hypertension, unspecified: Secondary | ICD-10-CM | POA: Diagnosis not present

## 2023-02-07 DIAGNOSIS — I4821 Permanent atrial fibrillation: Secondary | ICD-10-CM | POA: Diagnosis not present

## 2023-02-07 DIAGNOSIS — I5032 Chronic diastolic (congestive) heart failure: Secondary | ICD-10-CM | POA: Diagnosis not present

## 2023-02-07 DIAGNOSIS — E119 Type 2 diabetes mellitus without complications: Secondary | ICD-10-CM | POA: Diagnosis not present

## 2023-02-07 DIAGNOSIS — I480 Paroxysmal atrial fibrillation: Secondary | ICD-10-CM | POA: Diagnosis not present

## 2023-02-07 DIAGNOSIS — I428 Other cardiomyopathies: Secondary | ICD-10-CM | POA: Insufficient documentation

## 2023-02-07 MED ORDER — METOLAZONE 2.5 MG PO TABS: 2.5000 mg | ORAL_TABLET | Freq: Every day | ORAL | 0 refills | Status: DC

## 2023-02-07 NOTE — Progress Notes (Signed)
PCP: [name], MD (last seen 10/24) Primary Cardiologist: Dorothyann Peng, MD (last seen 01/24) HF MD: Marca Ancona, MD (last seen 10/24)  HPI:  Julia Stark is a 62 y.o. female with history of permanent atrial fibrillation, diastolic CHF, and diabetes.Patient has a long history of atrial fibrillation.  She has failed multiple anti-arrhythmic agents (flecainide, amiodarone, dronedarone, and propafenone).  She had an initial ablation in 5/21, then a redo ablation in 8/22.  At the time of 08/22 ablation, foci of atrial tachycardia were noted that were not ablated due to proximity to the AV node. 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  She had ongoing atrial arrhythmia episodes and underwent convergent endocardial/epicardial ablation at [facility] in 9/23 with left atrial appendage clipping.  This was complicated by ileus and then left hemothorax. She developed a recurrent left pleural effusion and ended up with left VATS in 10/23.  Atrial fibrillation recurred, and ultimately, she had AV nodal ablation with [facility] Scientific DDD pacemaker with left bundle lead placed.    Since her surgery, she has had chronic pain radiating from left lower chest to upper back. Primarily located at the site where she had a chest tube at time of hemothorax.  Pain is pleuritic. She was started on colchicine which initially helped but eventually stopped helping and she has stopped the medication.    She has been chronically short of breath since her surgery in 09/23.  Hemodynamic RHC/LHC in 4/24 showed no evidence for ventricular interdependence.  This was not suggestive of constrictive pericarditis.  Echo 04/24 showed EF 50-55%, moderate RV dysfunction, moderate RV enlargement, PASP 46 mmHg, mild-moderate TR; no evidence for constrictive pericarditis.   She has had dysphagia and has been diagnosed by GI with esophageal dysmotility. She tried a medication for esophageal dysmotility but it  caused GI upset so she stopped it.   Admitted 11/04/22 due to 4 to 6 months of progressively worsening dysphagia and weight loss. She feels like food gets stuck in her upper chest/lower neck, and it takes sometimes hours for it to feel like it fully makes its way to her stomach. This sensation takes place about a minute after she swallows. She does not have any coughing with food or drink. G-tube placement on 8/14. VBG was 7.23/99 and improved with adherence to BIPAP. Neurology was consulted for concern for NMJ pathology. EMG with repetitive nerve stimulation was essentially normal, making NMJ pathology unlikely. Diaphragm fluoroscopy on 8/13 demonstrating abnormally decreased diaphragmatic excursion, left worse than right without paradoxical motion of sniff test. Neg acetylcholine receptor antibody. Arranged for her to have ASTRAL VENT with f/u with Dr Danton Sewer in Vent clinic on 12/29/22. EGD with FLIP on 8/5 demonstrating suspicion for abnormal contractility, recommendation was for formal esophageal manometry at outpatient follow-up. She has follow up with GI on 12/28/22. Videofluoroscopy on 8/7 demonstrated esophageal retention, aspiration, and oropharyngeal aspiration with pharyngeal muscle weakness, accordingly with unsafe swallow initially. G-tube was placed on 8/14. Tolerating tube feeds.she will be discharged on TF 140 ml nutren 2 : 6 feeds/day,each feed over 1 hour through pump and 30 ml flushes. MRI /MRA brain done to evaluate dysphagia but showed stenosis and possible small dural AVF.It showed chronic small vessel disease. CTA neck and brain done to follow on this could not confirm or rule it out dural AVF but did not show stenosis. Neurosurgery consulted and did not recommend MRV and they felt risk of MRV since it will be done under  [place]( patient is claustrophobic and hypercarbic) outweigh the benefit. This all resulted in a lengthy admission of 26 days.  Was in the ED 02/04/23 due to worsening shortness of  breath and leg swelling for the last week or so. Had to increase her oxygen to 3L (normally wears 2L).  CXR showed moderate right pleural effusion. IV lasix given with improvement of symptoms.    Echo 07/14/22: Near-normal LVEF.  However, there are signs of RV failure.  This does not look like definite constrictive pericarditis by echo. Suspect primarily RV failure.  Plan for cath in future.  Echo 01/30/23: EF 55-60% with moderate LVH, moderately elevated PA pressure of 53.4 mmHg, severe RAE, mild MR, mild/ moderate TR  RHC/LHC 07/28/22:  1. Normal filling pressures.  2. Mild to moderate pulmonary arterial hypertension (PVR not markedly high, only 3.5 WU).  3. Preserved cardiac output.  4. No interventricular interdependence with simultaneous RV/LV pressure tracings, no evidence for constrictive pericarditis.   She presents today for a HF follow-up visit with a chief complaint of moderate SOB with minimal exertion. Chronic in nature although she feels like it is worsening. Has associated fatigue, abdominal distention (worsening), pedal edema (worsening), palpitations, dizziness, intermittent trouble sleeping and worsening anxiety in regards to her health. Denies chest pain, palpitations or cough. Continues to get tube feeds due to dysphagia/ aspiration but feels so bloated that she's not able to give herself the number of tube feeds that she needs. Has only had 1 feeding so far today and already feels very bloated. Continues wearing her oxygen at 2L around the clock with bipap at night. Is scheduled for cMRI in December but says that she's going to cancel it because of her claustrophobia and she's not going to put herself through it.   Received IV lasix in the ER 3 days ago with minimal improvement. She says that she also didn't much results from the 3 doses of furoscix she was given. She becomes extremely tearful in the office asking if she's going to die. She's thinking that no matter what she does,  it's not going to matter or improve her health. Is meeting with her therapist tomorrow.   ROS: All systems negative except as listed in HPI, PMH and Problem List.  SH:  Social History   Socioeconomic History   Marital status: Divorced    Spouse name: Not on file   Number of children: 3   Years of education: 12   Highest education level: Some college, no degree  Occupational History    Employer: [employer]    Comment: in special chemistry  Tobacco Use   Smoking status: Former    Types: Cigarettes   Smokeless tobacco: Never  Vaping Use   Vaping status: Never Used  Substance and Sexual Activity   Alcohol use: Not Currently    Alcohol/week: 4.0 standard drinks of alcohol    Types: 2 Glasses of wine, 2 Cans of beer per week   Drug use: No   Sexual activity: Yes    Partners: Male    Birth control/protection: Post-menopausal, Surgical  Other Topics Concern   Not on file  Social History Narrative   Not on file   Social Determinants of Health   Financial Resource Strain: Low Risk  (12/01/2022)   Received from [facility]   Overall Financial Resource Strain (CARDIA)    Difficulty of Paying Living Expenses: Not very hard  Recent Concern: Financial Resource Strain - Medium Risk (12/01/2022)  Overall Financial Resource Strain (CARDIA)    Difficulty of Paying Living Expenses: Somewhat hard  Food Insecurity: No Food Insecurity (01/29/2023)   Hunger Vital Sign    Worried About Running Out of Food in the Last Year: Never true    Ran Out of Food in the Last Year: Never true  Transportation Needs: No Transportation Needs (01/29/2023)   PRAPARE - Administrator, Civil Service (Medical): No    Lack of Transportation (Non-Medical): No  Physical Activity: Inactive (12/06/2022)   Exercise Vital Sign    Days of Exercise per Week: 0 days    Minutes of Exercise per Session: 0 min  Stress: Stress Concern Present (01/31/2023)   [employer] of Occupational  Health - Occupational Stress Questionnaire    Feeling of Stress : Rather much  Social Connections: Socially Isolated (10/02/2022)   Social Connection and Isolation Panel [NHANES]    Frequency of Communication with Friends and Family: More than three times a week    Frequency of Social Gatherings with Friends and Family: Once a week    Attends Religious Services: Never    Database administrator or Organizations: No    Attends Engineer, structural: Not on file    Marital Status: Divorced  Intimate Partner Violence: Not At Risk (12/14/2022)   Humiliation, Afraid, Rape, and Kick questionnaire    Fear of Current or Ex-Partner: No    Emotionally Abused: No    Physically Abused: No    Sexually Abused: No    FH:  Family History  Problem Relation Age of Onset   Diabetes Mother    Hypertension Mother    Skin cancer Mother    Depression Mother    Alzheimer's disease Father    Diabetes Sister    Depression Sister    Diabetes Brother    Colon cancer Maternal Uncle    Hypertension Maternal Grandfather    Hyperlipidemia Maternal Grandfather    Diabetes Paternal Grandmother    Heart disease Paternal Grandmother    Prostate cancer Paternal Grandfather    Ovarian cancer Cousin    Breast cancer Neg Hx    Cervical cancer Neg Hx     Past Medical History:  Diagnosis Date   Actinic keratosis 07/19/2022   L chin - bx proven, needs LN2   Anxiety    Arthritis    bilateral shoulders   Atrial fibrillation (HCC)    Basal cell carcinoma    forehead   Cancer (HCC)    BASAL CELL CARCINOMA OF FACE   CHF (congestive heart failure) (HCC)    Colon polyp    COVID-19 11/26/2020   Depression    Diabetes mellitus without complication (HCC)    Dysrhythmia    A fib   Esophageal dysmotility    Hyperlipidemia    Hypertension    Internal hemorrhoids    Sialoadenitis 12/24/2018    Current Outpatient Medications  Medication Sig Dispense Refill   acetaminophen (TYLENOL) 160 MG/5ML liquid  Take 500 mg by mouth every 4 (four) hours as needed for fever or pain.     apixaban (ELIQUIS) 5 MG TABS tablet Take 1 tablet (5 mg total) by mouth 2 (two) times daily. 60 tablet 6   bacitracin 500 UNIT/GM ointment Apply 1 Application topically 2 (two) times daily. 15 g 0   Furosemide (FUROSCIX) 80 MG/10ML CTKT Inject 1 Cartridge into the skin as directed. (Patient not taking: Reported on 02/01/2023)     linaclotide (LINZESS) 72  MCG capsule Take 1 capsule (72 mcg total) by mouth daily as needed. 30 capsule 5   melatonin 5 MG TABS Take 5 mg by mouth at bedtime.     metoCLOPramide (REGLAN) 5 MG/5ML solution Place 2.5 mLs (2.5 mg total) into feeding tube 2 (two) times daily. 120 mL 5   Multiple Vitamins-Minerals (TROPICAL LIQUID NUTRITION) LIQD Take 15 mLs by G tube once daily     omeprazole (KONVOMEP) 2 mg/mL SUSP oral suspension Take 10 mLs (20 mg total) by mouth 2 (two) times daily. 900 mL 5   polyethylene glycol powder (GLYCOLAX/MIRALAX) 17 GM/SCOOP powder Take 17 g by mouth 2 (two) times daily as needed. 850 g 1   potassium chloride (KLOR-CON) 20 MEQ packet 1 Packet (20 mEq) daily . 30 each 6   rosuvastatin (CRESTOR) 5 MG tablet Take 1 tablet (5 mg total) by mouth every other day. 45 tablet 1   spironolactone (ALDACTONE) 25 MG tablet Take 1 tablet (25 mg total) by mouth daily. 90 tablet 3   Torsemide 40 MG TABS Take 80 mg by mouth daily at 6 (six) AM. 180 tablet 3   venlafaxine (EFFEXOR) 75 MG tablet Take 1 tablet (75 mg total) by mouth 2 (two) times daily with a meal. 180 tablet 1   No current facility-administered medications for this visit.   Vitals:   02/07/23 0942  BP: (!) 109/35  Pulse: 70  SpO2: 100%  Weight: 153 lb (69.4 kg)   Wt Readings from Last 3 Encounters:  02/07/23 153 lb (69.4 kg)  02/04/23 152 lb 6.4 oz (69.1 kg)  02/01/23 154 lb (69.9 kg)   Lab Results  Component Value Date   CREATININE 0.42 (L) 02/04/2023   CREATININE 0.43 (L) 02/01/2023   CREATININE 0.35 (L)  01/23/2023    PHYSICAL EXAM:  General:  Thin appearing No resp difficulty HEENT: normal Neck: supple. JVP flat. No lymphadenopathy or thryomegaly appreciated. Cor: PMI normal. Regular rate & rhythm. No rubs, gallops or murmurs. Lungs: clear  Abdomen: soft, nontender,  mild distension. Gtube present. No hepatosplenomegaly. No bruits or masses.  Extremities: no cyanosis, clubbing, rash, 2 + pitting edema bilaterally with L>R Neuro: alert & oriented x3, cranial nerves grossly intact. Moves all 4 extremities w/o difficulty. Affect pleasant but extremely tearful.   ECG: not done  ReDs: 54%   ASSESSMENT & PLAN:  1: NICM with preserved ejection fraction- - NYHA class III - moderately fluid overloaded with worsening symptoms and elevated ReDs reading - weighing daily; reminded to call for an overnight weight gain of > 2 pounds or weekly weight gain of > 5 pounds - weight unchanged from last visit here 6 days ago - ReDs 54% - did not feel like the furoscix nor IV lasix helped and she does not want to go to the ED - will try metolazone 2.5mg  daily X 3 doses; double potassium on the days she takes the metolazone - if she responds well to 1 or 2 doses, she doesn't have to take all 3 doses - BMET next week - emphasized that should her symptoms worsen, that she should go to the ED for further management - echo 01/12/22: EF of 50% along with severe LAE/ RAE and moderate TR.  - Echo 07/14/22: Near-normal LVEF.  However, there are signs of RV failure.  This does not look like definite constrictive pericarditis by echo. Suspect primarily RV failure.   - Echo 01/30/23: EF 55-60% with moderate LVH, moderately elevated PA pressure of  53.4 mmHg, severe RAE, mild MR, mild/ moderate TR - RHC/LHC 07/28/22:  1. Normal filling pressures.  2. Mild to moderate pulmonary arterial hypertension (PVR not markedly high, only 3.5 WU).  3. Preserved cardiac output.  4. No interventricular interdependence with  simultaneous RV/LV pressure tracings, no evidence for constrictive pericarditis.  - cMRI 05/20/19: The left ventricle is normal in cavity size and wall thickness. Global systolic function is normal. The LV ejection fraction is 61%. There are no regional wall motion abnormalities. The right ventricle is normal in cavity size, wall thickness, and systolic function. There is mild bilateral atrial dilatation. The aortic valve is trileaflet in morphology. There is no significant aortic valve stenosis or  regurgitation. There is no significant valvular disease. Delayed enhancement imaging demonstrates no evidence of myocardial infarction, scar or infiltrative disease.  - So far, imaging has not been consistent with constrictive pericarditis.  Situation looks more like RV failure.   - currently has cMRI scheduled for December but she plans to cancel this due to her claustrophobia - not adding salt and has been reading food labels for sodium content - tries to be active but difficult with her fatigue/ SOB - continue torsemide 80mg  daily - continue spironolactone 25mg  daily - continue compression socks daily with removal at bedtime - elevate her legs when she can - history of UTI's so not a candidate for SGLT2 - BNP 02/04/23 was 271.4  2: HTN- - BP 109/35 - saw PCP Beryle Flock) 10/24  - BMP 02/04/23 showed sodium 138, potassium 3.6, creatinine 0.42 & GFR >60 - BMET next week  3: PAF- - saw cardiothoracic surgeon ([name]) 03/24   - multiple previous ablations/DCCV  - s/p convergent procedure 12/14/21  - s/p DC PPM with LB area lead and AV node ablation 05/17/22 - continue eliquis 5mg  BID - saw cardiology [name]) 01/24  4: Pulmonary HTN- - Mild-moderate with PVR 3.5 WU on 4/24 RHC - She has OSA which may contribute. - Has significant RV failure.   5: Esophageal dysmotility- - G- tube placed 11/15/22 - TF 140 ml nutren 2 : 6 feeds/day,each feed over 1 hour through pump and 30 ml flushes  although she's only gotten 1 feed in this morning because of how bloated she feels - saw GI 09/24 - albumin 12/05/22 was 4.0  6: Recurrent pleural effusions- - wearing oxygen at 2L around the clock - Recurrent pleural effusions status post VATS/decortication (01/2022)  - saw Dr Danton Sewer in Vent clinic 09/24 - patient very tearful regarding her health and that she feels like she's going to die; emotional support given and discussed palliative care referral to assist with symptom management, goals of care etc and she is open to this - palliative care referral placed  Return next week, sooner if needed.

## 2023-02-07 NOTE — Patient Instructions (Addendum)
Palliative care referral has been placed and they will call you to schedule an appointment at your home.    Take Metolazone 2.5 mg (1 tablet) for 3 days with Torsemide (30 min apart)  Double your Potassium dose while taking Metolazone.  Continue your Torsemide daily.  Follow up in 1 week with Dr. Elwyn Lade.

## 2023-02-07 NOTE — Progress Notes (Signed)
   02/07/23 1025  ReDS Vest / Clip  Station Marker C  Ruler Value 28  ReDS Value Range (!) > 40  ReDS Actual Value 54

## 2023-02-07 NOTE — Telephone Encounter (Signed)
Copied from CRM [id number]. Topic: General - Other >> Feb 07, 2023 11:11 AM Everette C wrote: Reason for CRM: Cordelia Pen with [facility] has called to share that [facility] will be seeing the patient for palliative care   Please contact Cordelia Pen further at [phone] if needed

## 2023-02-08 ENCOUNTER — Encounter: Payer: Self-pay | Admitting: Family Medicine

## 2023-02-08 DIAGNOSIS — F321 Major depressive disorder, single episode, moderate: Secondary | ICD-10-CM | POA: Diagnosis not present

## 2023-02-08 MED ORDER — VALACYCLOVIR HCL 1 G PO TABS: 1000.0000 mg | ORAL_TABLET | Freq: Every day | ORAL | 3 refills | Status: DC

## 2023-02-08 NOTE — Telephone Encounter (Signed)
Noted  

## 2023-02-09 ENCOUNTER — Encounter: Payer: Medicaid Other | Admitting: Internal Medicine

## 2023-02-13 ENCOUNTER — Other Ambulatory Visit: Payer: Self-pay

## 2023-02-13 ENCOUNTER — Other Ambulatory Visit
Admission: RE | Admit: 2023-02-13 | Discharge: 2023-02-13 | Disposition: A | Discharge status: Home / Self Care | Payer: Medicaid Other | Attending: Family | Admitting: Family

## 2023-02-13 DIAGNOSIS — I5032 Chronic diastolic (congestive) heart failure: Secondary | ICD-10-CM

## 2023-02-13 LAB — BASIC METABOLIC PANEL
BUN: 26 mg/dL — ABNORMAL HIGH (ref 8–23)
CO2: >45 mmol/L — ABNORMAL HIGH (ref 22–32)
Calcium: 9.3 mg/dL (ref 8.9–10.3)
Chloride: 75 mmol/L — ABNORMAL LOW (ref 98–111)
Creatinine, Ser: 0.44 mg/dL (ref 0.44–1.00)
GFR, Estimated: >60 mL/min (ref >60)
Glucose, Bld: 165 mg/dL — ABNORMAL HIGH (ref 70–99)
Potassium: 3.4 mmol/L — ABNORMAL LOW (ref 3.5–5.1)
Sodium: 135 mmol/L (ref 135–145)

## 2023-02-13 NOTE — Progress Notes (Unsigned)
ADVANCED HEART FAILURE FOLLOW UP CLINIC NOTE  Referring Physician: [name], MD  Primary Care: [name], MD Primary Cardiologist:  HPI: Julia Stark is a 62 y.o. female with a PMH of permanent atrial fibrillation, diastolic CHF, and diabetes who presents for follow up of heart failure.      Patient has failed multiple anti-arrhythmic agents (flecainide, amiodarone, dronedarone, and propafenone). She had an initial ablation in 5/21, then a redo ablation in 8/22. A. She had ongoing atrial arrhythmia episodes and underwent convergent endocardial/epicardial ablation at [facility] in 9/23 with left atrial appendage clipping. Atrial fibrillation recurred, and ultimately, she had AV nodal ablation with [facility] Scientific DDD pacemaker with left bundle lead placed.    She has been chronically short of breath since her surgery in 9/23. Hemodynamic RHC/LHC in 4/24 showed no evidence for ventricular interdependence. This was not suggestive of constrictive pericarditis. Echo in 4/24 showed EF 50-55%, moderate RV dysfunction, moderate RV enlargement, PASP 46 mmHg, mild-moderate TR; no evidence for constrictive pericarditis.   She was admitted at [facility] in 8/24 with weight loss and dysphagia. She was found to have abnormal esophageal manometry and had pharyngeal muscle weakness with aspiration. She also had diaphragmatic fluoroscopy showing abnormally decreased excursion, worse on left. No achalasia. She had a G tube placed and is getting tube feeds at home now.   She was recently seen in heart failure clinic at which point in time she appeared volume overloaded with a REDs of 51%.  She was instructed to take 3 doses of metolazone 2.5 mg and follow-up in a week.     SUBJECTIVE:  Patient reports that she is feeling better from a volume standpoint.  She only took 2 doses of the metolazone and has not taken her torsemide today.  She now reports that she is dizzy when standing and is  extremely fatigued.  Her swelling has improved, though still has trace edema around her ankles.  We spent some time discussing the progressive nature of her condition and that most of her therapies at this point are aimed at controlling her symptoms.  She has a therapy appointment next week and is awaiting follow-up with palliative care.  PMH, current medications, allergies, social history, and family history reviewed in epic.  PHYSICAL EXAM: Vitals:   02/14/23 0953  BP: (!) 125/48  Pulse: 70  Resp: (!) 24  SpO2: 100%   GENERAL: Chronically ill-appearing HEENT: Dry mucous membranes PULM:  Normal work of breathing, clear to auscultation bilaterally. Respirations are unlabored.  CARDIAC:  JVP: Mildly elevated with V wave         Normal rate with regular rhythm. No murmurs, rubs or gallops.  Trace lower extremity edema.  ABDOMEN: Soft NEUROLOGIC: Patient is oriented x3 with no focal or lateralizing neurologic deficits.  PSYCH: Patients affect is appropriate, there is no evidence of anxiety or depression.  SKIN: Warm and dry; no lesions or wounds. Warm and well perfused extremities.  ASSESSMENT & PLAN:  Heart failure with preserved ejection fraction: NYHA class III symptoms, has been diuretic resistant with worsening volume symptoms at her last visit.  She has been worked up for constrictive pericarditis which was negative.  Her symptoms are likely due to predominantly RV failure.  [name] currently hypovolemic as evidenced by her exam, orthostatic symptoms, and severe chloride depletion alkalosis.   - Hold torsemide for an additional day and restart Friday at 20 mg daily -Increase to 40 daily for weight gain or  worsening shortness of breath -Continue spironolactone 25 mg daily -Metolazone only for extreme symptoms, would likely increase potassium supplementation if needed -Not a candidate for SGLT2 -Plans to cancel her cardiac MRI -Encouraged compression and nutrition to help with plasma  refill -Palliative care consultation pending  HTN - Well controlled  PAF- - saw cardiothoracic surgeon ([name]) 03/24   - multiple previous ablations/DCCV  - s/p convergent procedure 12/14/21  - s/p DC PPM with LB area lead and AV node ablation 05/17/22 - continue eliquis 5mg  BID - saw cardiology [name]) 01/24   Pulmonary HTN- - Mild-moderate with PVR 3.5 WU on 4/24 RHC - She has OSA which may contribute. - Has significant RV failure.   Esophageal dysmotility- - G- tube placed 11/15/22 - TF 140 ml nutren 2 : 6 feeds/day,each feed over 1 hour through pump and 30 ml flushes - saw GI 09/24 - albumin 12/05/22 was 4.0  Clearnce Hasten, MD Advanced Heart Failure Mechanical Circulatory Support 02/14/23

## 2023-02-14 ENCOUNTER — Encounter: Payer: Self-pay | Admitting: Cardiology

## 2023-02-14 ENCOUNTER — Ambulatory Visit: Payer: Medicaid Other | Attending: Cardiology | Admitting: Cardiology

## 2023-02-14 VITALS — BP 125/48 | HR 70 | Resp 24 | Wt 142.1 lb

## 2023-02-14 DIAGNOSIS — Z7901 Long term (current) use of anticoagulants: Secondary | ICD-10-CM | POA: Diagnosis not present

## 2023-02-14 DIAGNOSIS — I110 Hypertensive heart disease with heart failure: Secondary | ICD-10-CM | POA: Diagnosis not present

## 2023-02-14 DIAGNOSIS — I5032 Chronic diastolic (congestive) heart failure: Secondary | ICD-10-CM | POA: Insufficient documentation

## 2023-02-14 DIAGNOSIS — K224 Dyskinesia of esophagus: Secondary | ICD-10-CM | POA: Insufficient documentation

## 2023-02-14 DIAGNOSIS — I480 Paroxysmal atrial fibrillation: Secondary | ICD-10-CM | POA: Insufficient documentation

## 2023-02-14 DIAGNOSIS — I272 Pulmonary hypertension, unspecified: Secondary | ICD-10-CM | POA: Insufficient documentation

## 2023-02-14 DIAGNOSIS — I4821 Permanent atrial fibrillation: Secondary | ICD-10-CM | POA: Diagnosis present

## 2023-02-14 DIAGNOSIS — E119 Type 2 diabetes mellitus without complications: Secondary | ICD-10-CM | POA: Diagnosis not present

## 2023-02-14 MED ORDER — TORSEMIDE 20 MG PO TABS: 20.0000 mg | ORAL_TABLET | Freq: Every day | ORAL | 3 refills | Status: DC

## 2023-02-14 NOTE — Progress Notes (Signed)
   02/14/23 0956  ReDS Vest / Clip  Station Marker C  Ruler Value 28  ReDS Value Range < 36  ReDS Actual Value 31

## 2023-02-14 NOTE — Patient Instructions (Signed)
Medication Changes:  Please Hold your torsemide for 3 days. Then Start taking Torsemide 20 mg (1 tablet) daily.    Special Instructions // Education:  Do the following things EVERYDAY: Weigh yourself in the morning before breakfast. Write it down and keep it in a log. Take your medicines as prescribed Eat low salt foods--Limit salt (sodium) to 2000 mg per day.  Stay as active as you can everyday Limit all fluids for the day to less than 2 liters   Follow-Up in: follow up in 1 month with Dr. Gala Romney.    If you have any questions or concerns before your next appointment please send [place] a message through [place] or call our office at [phone] Monday-Friday 8 am-5 pm.   If you have an urgent need after hours on the weekend please call your Primary Cardiologist or the Advanced Heart Failure Clinic in [place] at [phone].   At the Advanced Heart Failure Clinic, you and your health needs are our priority. We have a designated team specialized in the treatment of Heart Failure. This Care Team includes your primary Heart Failure Specialized Cardiologist (physician), Advanced Practice Providers (APPs- Physician Assistants and Nurse Practitioners), and Pharmacist who all work together to provide you with the care you need, when you need it.   You may see any of the following providers on your designated Care Team at your next follow up:  Dr. [name] Dr. Marca Ancona Dr. Dorthula Nettles Dr. Theresia Bough Tonye Becket, NP Robbie Lis, [place] [address] [place], [place] Brynda Peon, NP [place] Lee, NP [name], NP Enos Fling, PharmD

## 2023-02-15 DIAGNOSIS — F321 Major depressive disorder, single episode, moderate: Secondary | ICD-10-CM | POA: Diagnosis not present

## 2023-02-16 DIAGNOSIS — G4734 Idiopathic sleep related nonobstructive alveolar hypoventilation: Secondary | ICD-10-CM | POA: Diagnosis not present

## 2023-02-16 DIAGNOSIS — F331 Major depressive disorder, recurrent, moderate: Secondary | ICD-10-CM | POA: Diagnosis not present

## 2023-02-16 DIAGNOSIS — I471 Supraventricular tachycardia, unspecified: Secondary | ICD-10-CM | POA: Diagnosis not present

## 2023-02-19 ENCOUNTER — Other Ambulatory Visit: Payer: Self-pay | Admitting: Licensed Clinical Social Worker

## 2023-02-19 NOTE — Patient Instructions (Signed)
Visit Information  Julia Stark was given information about Medicaid Managed Care team care coordination services as a part of their Healthy [employer] Medicaid benefit. Julia Stark verbally consented to engagement with the [facility] Managed Care team.   If you are experiencing a medical emergency, please call 911 or report to your local emergency department or urgent care.   If you have a non-emergency medical problem during routine business hours, please contact your provider's office and ask to speak with a nurse.   For questions related to your Healthy [facility] health plan, please call: [phone] or visit the homepage here: [email or web address]  If you would like to schedule transportation through your Healthy [name] plan, please call the following number at least 2 days in advance of your appointment: [phone]  For information about your ride after you set it up, call Ride Assist at [phone]. Use this number to activate a Will Call pickup, or if your transportation is late for a scheduled pickup. Use this number, too, if you need to make a change or cancel a previously scheduled reservation.  If you need transportation services right away, call [phone]. The after-hours call center is staffed 24 hours to handle ride assistance and urgent reservation requests (including discharges) 365 days a year. Urgent trips include sick visits, hospital discharge requests and life-sustaining treatment.  Call the [facility] Line at [phone], at any time, 24 hours a day, 7 days a week. If you are in danger or need immediate medical attention call 911.  If you would like help to quit smoking, call 1-800-QUIT-NOW (801-438-6372) OR Espaol: 1-855-Djelo-Ya (8-756-433-2951) o para ms informacin haga clic aqu or Text READY to 884-166 to register via text   Following is a copy of your plan of care:  Care Plan : LCSW Plan of Care   Updates made by Gustavus Bryant, LCSW since 02/19/2023 12:00 AM     Problem: Coping Skills (General Plan of Care)      Goal: Coping Skills Enhanced   Start Date: 12/20/2022  Priority: High  Note:   Priority: High  Timeframe:  Short-Range Goal Priority:  High Start Date:   12/20/22               Expected End Date:  ongoing                     Follow Up Date--04/03/23 at 2 pm  - keep 90 percent of scheduled appointments -consider counseling or psychiatry -consider bumping up your self-care  -consider creating a stronger support network   Why is this important?             Combatting depression may take some time.            If you don't feel better right away, don't give up on your treatment plan.    Current barriers:   Chronic Mental Health needs related to stress and anxiety regarding managing her health and new normal. Patient requires Support, Education, Resources, Referrals, Advocacy, and Care Coordination, in order to meet Unmet Mental Health Needs To Find a Therapist.  Patient will implement clinical interventions discussed today to decrease symptoms of depression and increase knowledge and/or ability of: coping skills. Mental Health Concerns and Social Isolation Patient lacks knowledge of available community counseling agencies and resources.  Clinical Goal(s): verbalize understanding of plan for management of Anxiety, Depression, and Stress and demonstrate a reduction in symptoms. Patient will connect with a provider for  ongoing mental health treatment, increase coping skills, healthy habits, self-management skills, and stress reduction        Patient Goals/Self-Care Activities: Take medications as prescribed   Attend all scheduled provider appointments Call pharmacy for medication refills 3-7 days in advance of running out of medications Perform all self care activities independently  Perform IADL's (shopping, preparing meals, housekeeping, managing finances)  independently Call provider office for new concerns or questions Work with the social worker to address care coordination needs and will continue to work with the clinical team to address health care and disease management related needs call 1-800-273-TALK (toll free, 24 hour hotline) go to [facility] [address] [place] [phone]) if an urgent crisis arises  call 911 if experiencing a Mental Health or Behavioral Health Crisis  Utilize healthy coping skills and supportive resources discussed Contact PCP with any questions or concerns Keep 90 percent of counseling appointments Call your insurance provider for more information about your Enhanced Benefits  Check out counseling resources provided  Begin personal counseling with LCSW, to reduce and manage symptoms of Depression and Stress, until well-established with mental health provider Accept all calls from representative with [employer] in an effort to establish ongoing mental health counseling and supportive services. Incorporate into daily practice - relaxation techniques, deep breathing exercises, and mindfulness meditation strategies. Talk about feelings with friends, family members, spiritual advisor, etc. Contact LCSW directly [phone]), if you have questions, need assistance, or if additional social work needs are identified between now and our next scheduled telephone outreach call. Call 988 for mental health hotline/crisis line if needed (24/7 available) Try techniques to reduce symptoms of anxiety/negative thinking (deep breathing, distraction, positive self talk, etc)  - develop a personal safety plan - develop a plan to deal with triggers like holidays, anniversaries - exercise at least 2 to 3 times per week - have a plan for how to handle bad days - journal feelings and what helps to feel better or worse - spend time or talk with others at least 2 to 3 times per week -  watch for early signs of feeling worse - begin personal counseling - call and visit an old friend - check out volunteer opportunities - join a support group - laugh; watch a funny movie or comedian - learn and use visualization or guided imagery - perform a random act of kindness - practice relaxation or meditation daily - start or continue a personal journal - practice positive thinking and self-talk -continue with compliance of taking medication  -identify current effective and ineffective coping strategies.  -implement positive self-talk in care to increase self-esteem, confidence and feelings of control.  -consider alternative and complementary therapy approaches such as meditation, mindfulness or yoga.  Call your insurance provider to gain education on benefits if desired Call your primary care doctor if symptoms get worse  -journaling, prayer, worship services, meditation or pastoral counseling.  -increase participation in pleasurable group activities such as hobbies, singing, sports or volunteering).  -consider the use of meditative movement therapy such as tai chi, yoga or qigong.  -start a regular daily exercise program based on tolerance, ability and patient choice to support positive thinking and activity    Follow Up Plan:  The patient has been provided with contact information for the care management team and has been advised to call with any mental health or health related questions or concerns. The care management team will reach out to the patient again over  the next 30 business  days.   If you are experiencing a Mental Health or Behavioral Health Crisis or need someone to talk to, please call the Suicide and Crisis Lifeline: 988    Patient Goals: Follow up goal  The following coping skill education was provided for stress relief and mental health management: "When your car dies or a deadline looms, how do you respond? Long-term, low-grade or acute stress takes a serious  toll on your body and mind, so don't ignore feelings of constant tension. Stress is a natural part of life. However, too much stress can harm our health, especially if it continues every day. This is chronic stress and can put you at risk for heart problems like heart disease and depression. Understand what's happening inside your body and learn simple coping skills to combat the negative impacts of everyday stressors.  Types of Stress There are two types of stress: Emotional - types of emotional stress are relationship problems, pressure at work, financial worries, experiencing discrimination or having a major life change. Physical - Examples of physical stress include being sick having pain, not sleeping well, recovery from an injury or having an alcohol and drug use disorder. Fight or Flight Sudden or ongoing stress activates your nervous system and floods your bloodstream with adrenaline and cortisol, two hormones that raise blood pressure, increase heart rate and spike blood sugar. These changes pitch your body into a fight or flight response. That enabled our ancestors to outrun saber-toothed tigers, and it's helpful today for situations like dodging a car accident. But most modern chronic stressors, such as finances or a challenging relationship, keep your body in that heightened state, which hurts your health. Effects of Too Much Stress If constantly under stress, most of [place] will eventually start to function less well.  Multiple studies link chronic stress to a higher risk of heart disease, stroke, depression, weight gain, memory loss and even premature death, so it's important to recognize the warning signals. Talk to your doctor about ways to manage stress if you're experiencing any of these symptoms: Prolonged periods of poor sleep. Regular, severe headaches. Unexplained weight loss or gain. Feelings of isolation, withdrawal or worthlessness. Constant anger and irritability. Loss of  interest in activities. Constant worrying or obsessive thinking. Excessive alcohol or drug use. Inability to concentrate.  10 Ways to Cope with Chronic Stress It's key to recognize stressful situations as they occur because it allows you to focus on managing how you react. We all need to know when to close our eyes and take a deep breath when we feel tension rising. Use these tips to prevent or reduce chronic stress. 1. Rebalance Work and Home All work and no play? If you're spending too much time at the office, intentionally put more dates in your calendar to enjoy time for fun, either alone or with others. 2. Get Regular Exercise Moving your body on a regular basis balances the nervous system and increases blood circulation, helping to flush out stress hormones. Even a daily 20-minute walk makes a difference. Any kind of exercise can lower stress and improve your mood ? just pick activities that you enjoy and make it a regular habit. 3. Eat Well and Limit Alcohol and Stimulants Alcohol, nicotine and caffeine may temporarily relieve stress but have negative health impacts and can make stress worse in the long run. Well-nourished bodies cope better, so start with a good breakfast, add more organic fruits and vegetables for a well-balanced diet, avoid  processed foods and sugar, try herbal tea and drink more water. 4. Connect with Supportive People Talking face to face with another person releases hormones that reduce stress. Lean on those good listeners in your life. 5. Carve Out Hobby Time Do you enjoy gardening, reading, listening to music or some other creative pursuit? Engage in activities that bring you pleasure and joy; research shows that reduces stress by almost half and lowers your heart rate, too. 6. Practice Meditation, Stress Reduction or Yoga Relaxation techniques activate a state of restfulness that counterbalances your body's fight-or-flight hormones. Even if this also means a  10-minute break in a long day: listen to music, read, go for a walk in nature, do a hobby, take a bath or spend time with a friend. Also consider doing a mindfulness exercise or try a daily deep breathing or imagery practice. Deep Breathing Slow, calm and deep breathing can help you relax. Try these steps to focus on your breathing and repeat as needed. Find a comfortable position and close your eyes. Exhale and drop your shoulders. Breathe in through your nose; fill your lungs and then your belly. Think of relaxing your body, quieting your mind and becoming calm and peaceful. Breathe out slowly through your nose, relaxing your belly. Think of releasing tension, pain, worries or distress. Repeat steps three and four until you feel relaxed. Imagery This involves using your mind to excite the senses -- sound, vision, smell, taste and feeling. This may help ease your stress. Begin by getting comfortable and then do some slow breathing. Imagine a place you love being at. It could be somewhere from your childhood, somewhere you vacationed or just a place in your imagination. Feel how it is to be in the place you're imagining. Pay attention to the sounds, air, colors, and who is there with you. This is a place where you feel cared for and loved. All is well. You are safe. Take in all the smells, sounds, tastes and feelings. As you do, feel your body being nourished and healed. Feel the calm that surrounds you. Breathe in all the good. Breathe out any discomfort or tension. 7. Sleep Enough If you get less than seven to eight hours of sleep, your body won't tolerate stress as well as it could. If stress keeps you up at night, address the cause, and add extra meditation into your day to make up for the lost z's. Try to get seven to nine hours of sleep each night. Make a regular bedtime schedule. Keep your room dark and cool. Try to avoid computers, TV, cell phones and tablets before bed. 8. Bond with  Connections You Enjoy Go out for a coffee with a friend, chat with a neighbor, call a family member, visit with a clergy member, or even hang out with your pet. Clinical studies show that spending even a short time with a companion animal can cut anxiety levels almost in half. 9. Take a Vacation Getting away from it all can reset your stress tolerance by increasing your mental and emotional outlook, which makes you a happier, more productive person upon return. Leave your cellphone and laptop at home! 10. See a Counselor, Coach or Therapist If negative thoughts overwhelm your ability to make positive changes, it's time to seek professional help. Make an appointment today--your health and life are worth it."     24- Hour Availability:    [facility]  [address] [place], [place] [employer] [phone] Crisis [phone]  [facility] [phone]  [facility] Crisis Service  [phone]    [facility] Crisis Services  [phone] (after hours)   Therapeutic Alternative/Mobile Crisis   [phone]   [place] National Suicide Hotline  (902) 567-0271 Len Child[place]) [place] [address]   Call (402)102-4286 for mental health emergencies   [facility]  [phone]);  [place] and [employer]  ([phone]); [place], [place], [place], [place], [place], [place], [place]    [facility] for [facility] Residents For 24/7 walk-up access to mental health services for [facility] children (4+), adolescents and adults, please visit the [facility] located at [address] in [place], [place].  *[place] also provides comprehensive outpatient behavioral health services in a variety of locations around the [place].  Connect With [place] [address] [place], [place] [address] HelpLine: 234-641-7747 or 1-956-218-6797  Get Directions  Find Help 24/7 By Phone Call our  24-hour HelpLine at 307-820-7495 or 757-757-0897 for immediate assistance for mental health and substance abuse issues.  Walk-In Help [place]: [facility] (Ages 4 and Up) [facility]: Emergency Dept., [facility] Additional Resources National Hopeline Network: 1-800-SUICIDE The National Suicide Prevention Lifeline: 6-237-628-BTDV     Dickie La, BSW, MSW, LCSW Licensed Clinical Social Worker [employer]   [email or web address] Direct Dial: [phone]

## 2023-02-19 NOTE — Patient Outreach (Signed)
Medicaid Managed Care Social Work Note  02/19/2023 Name:  Julia Stark MRN:  [id number] DOB:  1961-01-31  Julia Stark is an 62 y.o. year old female who is a primary patient of Bacigalupo, Marzella Schlein, MD.  The Medicaid Managed Care Coordination team was consulted for assistance with:  Mental Health Counseling and Resources  Julia Stark was given information about Medicaid Managed Care Coordination team services today. Julia Stark Patient agreed to services and verbal consent obtained.  Engaged with patient  for by telephone forfollow up visit in response to referral for case management and/or care coordination services.   Assessments/Interventions:  Review of past medical history, allergies, medications, health status, including review of consultants reports, laboratory and other test data, was performed as part of comprehensive evaluation and provision of chronic care management services.  SDOH: (Social Determinant of Health) assessments and interventions performed: SDOH Interventions    Flowsheet Row Patient Outreach Telephone from 02/19/2023 in [place] POPULATION HEALTH DEPARTMENT Patient Outreach Telephone from 01/31/2023 in [place] POPULATION HEALTH DEPARTMENT Patient Outreach Telephone from 01/29/2023 in [place] POPULATION HEALTH DEPARTMENT Telephone from 12/14/2022 in [place] POPULATION HEALTH DEPARTMENT Telephone from 12/06/2022 in  POPULATION HEALTH DEPARTMENT Office Visit from 12/05/2022 in [facility]  SDOH Interventions        Food Insecurity Interventions -- -- Intervention Not Indicated Intervention Not Indicated -- --  Housing Interventions -- -- -- Intervention Not Indicated -- --  Transportation Interventions Intervention Not Indicated -- -- Intervention Not Indicated -- --  Utilities Interventions -- -- -- Intervention Not Indicated -- --  Depression Interventions/Treatment  -- -- -- --  [referred patient to LCSW for  counseling/assistance with coping with life-altering chronic illness] -- Currently on Treatment  Physical Activity Interventions -- -- -- -- Other (Comments)  [HHS PT 2Xs per week] --  Stress Interventions Offered [employer], Provide Counseling  [Pt has successfully completed intake appt for counseling and her first therapy appointment has been set] Other (Comment)  [Referral placed for talk therapy] -- -- -- --  Health Literacy Interventions -- -- -- Intervention Not Indicated -- --       Advanced Directives Status:  See Care Plan for related entries.  Care Plan                 Allergies  Allergen Reactions   Acetazolamide Other (See Comments)    Confusion   Amiodarone Other (See Comments)    Other Reaction(s): Other (See Comments), Vomiting  Blurred vision   Flecainide Nausea And Vomiting    Hallucination   Metoprolol Shortness Of Breath     Muscle Pain     Trazodone Other (See Comments)    headaches    Medications Reviewed Today   Medications were not reviewed in this encounter     Patient Active Problem List   Diagnosis Date Noted   Esophageal dysphagia 12/05/2022   Acute respiratory failure with hypoxia and hypercarbia (HCC) 12/05/2022   Injury of phrenic nerve 12/05/2022   Failure to thrive in adult 12/05/2022   Tinnitus, right ear 12/05/2022   Pulmonary hypertension (HCC) 12/05/2022   Anemia 02/27/2022   Acquired thrombophilia (HCC) 07/14/2021   Status post radiofrequency ablation for arrhythmia 01/09/2021   On dronedarone therapy 11/16/2020   Renal stones 11/16/2020   Salivary gland calculi 11/16/2020   Ureteral stone 05/15/2020   Moderate episode of recurrent major depressive disorder (HCC) 12/29/2019   Overweight 12/04/2019  Basal cell carcinoma of face 10/03/2019   Typical atrial flutter (HCC) 08/15/2019   Lipoma of left upper extremity 08/04/2019   Primary insomnia 06/23/2019   Red blood cell antibody positive 05/22/2019    Chronic anticoagulation 06/19/2018   Affective disorder, major 08/22/2017   Allergic rhinitis 08/22/2017   Anxiety, generalized 08/22/2017   T2DM (type 2 diabetes mellitus) (HCC) 08/22/2017   Hypertension associated with diabetes (HCC) 08/22/2017   Hyperlipidemia associated with type 2 diabetes mellitus (HCC) 08/22/2017   Mobitz type 2 second degree atrioventricular block 02/05/2017   Paroxysmal A-fib (HCC) 05/25/2015   Recurrent genital HSV (herpes simplex virus) infection 03/13/2014   Chronic constipation 02/17/2014   Reflux gastritis 02/17/2014   Adenomatous polyp of descending colon 02/12/2012    Conditions to be addressed/monitored per PCP order:  Anxiety  Care Plan : LCSW Plan of Care  Updates made by Julia Bryant, LCSW since 02/19/2023 12:00 AM     Problem: Coping Skills (General Plan of Care)      Goal: Coping Skills Enhanced   Start Date: 12/20/2022  Priority: High  Note:   Priority: High  Timeframe:  Short-Range Goal Priority:  High Start Date:   12/20/22               Expected End Date:  ongoing                     Follow Up Date--04/03/23 at 2 pm  - keep 90 percent of scheduled appointments -consider counseling or psychiatry -consider bumping up your self-care  -consider creating a stronger support network   Why is this important?             Combatting depression may take some time.            If you don't feel better right away, don't give up on your treatment plan.    Current barriers:   Chronic Mental Health needs related to stress and anxiety regarding managing her health and new normal. Patient requires Support, Education, Resources, Referrals, Advocacy, and Care Coordination, in order to meet Unmet Mental Health Needs To Find a Therapist.  Patient will implement clinical interventions discussed today to decrease symptoms of depression and increase knowledge and/or ability of: coping skills. Mental Health Concerns and Social Isolation Patient lacks  knowledge of available community counseling agencies and resources.  Clinical Goal(s): verbalize understanding of plan for management of Anxiety, Depression, and Stress and demonstrate a reduction in symptoms. Patient will connect with a provider for ongoing mental health treatment, increase coping skills, healthy habits, self-management skills, and stress reduction        Clinical Interventions:  Assessed patient's previous and current treatment, coping skills, support system and barriers to care. Patient provided hx. Patient and spouse live together. Patient reports that her spouse is very supportive. Verbalization of feelings encouraged, motivational interviewing employed Emotional support provided, positive coping strategies explored. Establishing healthy boundaries emphasized and healthy self-care education provided Patient was educated on available mental health resources within their area that accept Medicaid and offer counseling and psychiatry. Patient was advised to contact the back of her insurance card for assistance with benefits as well. Patient educated on the difference between therapy and psychiatry per patient request Email sent to patient today with available mental health resources within her area that accept Medicaid and offer the services that she is interested in.  Patient prefers talk therapy ONLY at this time and is agreeable to referral to  [facility] for in person therapy with a female counselor as this location is right near her residence.  Emotional support provided. CBT intervention implemented regarding "being mentally fit" by combating negative thinking and replacing it with uplifting support, hope and positivity. Patient reports significant worsening anxiety impacting her ability to function appropriately and carry out daily task. Assessed social determinant of health barriers LCSW provided education on relaxation techniques such as meditation, deep breathing,  massage, grounding exercises or yoga that can activate the body's relaxation response and ease symptoms of stress and anxiety. LCSW ask that when pt is struggling with difficult emotions and racing thoughts that they start this relaxation response process. LCSW provided extensive education on healthy coping skills for anxiety. SW used active and reflective listening, validated patient's feelings/concerns, and provided emotional support. Patient will work on implementing appropriate self-care habits into their daily routine such as: staying positive, writing a gratitude list, drinking water, staying active around the house, taking their medications as prescribed, combating negative thoughts or emotions and staying connected with their family and friends. Positive reinforcement provided for this decision to work on this.  Motivational Interviewing employed Depression screen reviewed  PHQ2/ PHQ9 completed or reviewed  Mindfulness or Relaxation training provided Active listening / Reflection utilized  Advance Care and HCPOA education provided Emotional Support Provided Problem Solving /Task Center strategies reviewed Provided psychoeducation for mental health needs  Provided brief CBT  Reviewed mental health medications and discussed importance of compliance:  Quality of sleep assessed & Sleep Hygiene techniques promoted  Participation in counseling encouraged  Verbalization of feelings encouraged  Suicidal Ideation/Homicidal Ideation assessed: Patient denies SI/HI  Review resources, discussed options and provided patient information about  Mental Health Resources Inter-disciplinary care team collaboration (see longitudinal plan of care) Update- Patient reports that she has not received a call from [facility] but she is not entirely sure. [facility] LCSW placed joint call to [facility] and was informed that pt is currently on the wait list for in person therapy and should be getting a call  within 4-10 weeks to set up her initial therapy appointment and at that time she will be responsible for completing their electronic intake paperwork. Patient physically wrote down [facility]'s number in case she wishes to check on her status on the wait list over the next few weeks.01/31/23 update- Patient reports that she has been successfully contacted by [facility] and was informed that they have a wait list of 10 weeks and was advised to consider [facility]. She reports that she has been in contact with this agency and is going to schedule her initial appointment once she hears back from office. Patient declined needing their contact information as she already has it written down. [facility] LCSW will follow up within 30 days to ensure therapy was successfully established. Update-Pt reports that she has already completed her initial intake appointment at the counseling center she has chosen but is unsure when her initial counseling appointment is. She said she cannot find the date where she wrote it down but thinks it is scheduled for this week. [name] agreeable to contact agency per email and by phone. However, she reports having a hard time reaching this agency by phone. She was advised that she can consider another agency for therapy if she wishes. [facility] LCSW encouraged patient to review the behavioral health resources that were sent to her email. Patient reports being stable at this time. LCSW is happy to make a new  referral for another agency for behavioral health treatment if she desires. [facility] LCSW will follow up within 60 days to ensure therapy as successfully established. Brief self-care education provided.   Patient Goals/Self-Care Activities: Take medications as prescribed   Attend all scheduled provider appointments Call pharmacy for medication refills 3-7 days in advance of running out of medications Perform all self care activities independently  Perform IADL's (shopping,  preparing meals, housekeeping, managing finances) independently Call provider office for new concerns or questions Work with the social worker to address care coordination needs and will continue to work with the clinical team to address health care and disease management related needs call 1-800-273-TALK (toll free, 24 hour hotline) go to [facility] [address] [place] [phone]) if an urgent crisis arises  call 911 if experiencing a Mental Health or Behavioral Health Crisis  Utilize healthy coping skills and supportive resources discussed Contact PCP with any questions or concerns Keep 90 percent of counseling appointments Call your insurance provider for more information about your Enhanced Benefits  Check out counseling resources provided  Begin personal counseling with LCSW, to reduce and manage symptoms of Depression and Stress, until well-established with mental health provider Accept all calls from representative with [facility] in an effort to establish ongoing mental health counseling and supportive services. Incorporate into daily practice - relaxation techniques, deep breathing exercises, and mindfulness meditation strategies. Talk about feelings with friends, family members, spiritual advisor, etc. Contact LCSW directly [phone]), if you have questions, need assistance, or if additional social work needs are identified between now and our next scheduled telephone outreach call. Call 988 for mental health hotline/crisis line if needed (24/7 available) Try techniques to reduce symptoms of anxiety/negative thinking (deep breathing, distraction, positive self talk, etc)  - develop a personal safety plan - develop a plan to deal with triggers like holidays, anniversaries - exercise at least 2 to 3 times per week - have a plan for how to handle bad days - journal feelings and what helps to feel better or worse - spend time or  talk with others at least 2 to 3 times per week - watch for early signs of feeling worse - begin personal counseling - call and visit an old friend - check out volunteer opportunities - join a support group - laugh; watch a funny movie or comedian - learn and use visualization or guided imagery - perform a random act of kindness - practice relaxation or meditation daily - start or continue a personal journal - practice positive thinking and self-talk -continue with compliance of taking medication  -identify current effective and ineffective coping strategies.  -implement positive self-talk in care to increase self-esteem, confidence and feelings of control.  -consider alternative and complementary therapy approaches such as meditation, mindfulness or yoga.  Call your insurance provider to gain education on benefits if desired Call your primary care doctor if symptoms get worse  -journaling, prayer, worship services, meditation or pastoral counseling.  -increase participation in pleasurable group activities such as hobbies, singing, sports or volunteering).  -consider the use of meditative movement therapy such as tai chi, yoga or qigong.  -start a regular daily exercise program based on tolerance, ability and patient choice to support positive thinking and activity    Follow Up Plan:  The patient has been provided with contact information for the care management team and has been advised to call with any mental health or health related questions or concerns. The care  management team will reach out to the patient again over the next 30 business  days.   If you are experiencing a Mental Health or Behavioral Health Crisis or need someone to talk to, please call the Suicide and Crisis Lifeline: 988    Patient Goals: Follow up goal      Follow up:  Patient agrees to Care Plan and Follow-up.  Plan: The Managed Medicaid care management team will reach out to the patient again over the next  30 days.  Dickie La, BSW, MSW, LCSW Licensed Clinical Social Worker [employer]   [facility] [email or web address] Direct Dial: [phone]

## 2023-02-24 ENCOUNTER — Other Ambulatory Visit: Payer: Self-pay | Admitting: Family Medicine

## 2023-02-26 NOTE — Telephone Encounter (Signed)
Requested Prescriptions  Pending Prescriptions Disp Refills   rosuvastatin (CRESTOR) 5 MG tablet [Pharmacy Med Name: Rosuvastatin Calcium 5 MG Oral Tablet] 45 tablet 0    Sig: TAKE 1 TABLET BY MOUTH EVERY OTHER DAY     Cardiovascular:  Antilipid - Statins 2 Failed - 02/24/2023  9:17 AM      Failed - Lipid Panel in normal range within the last 12 months    Cholesterol, Total  Date Value Ref Range Status  09/04/2022 192 100 - 199 mg/dL Final   Cholesterol  Date Value Ref Range Status  10/18/2012 193 0 - 200 mg/dL Final   Ldl Cholesterol, Calc  Date Value Ref Range Status  10/18/2012 113 (H) 0 - 100 mg/dL Final   LDL Chol Calc (NIH)  Date Value Ref Range Status  09/04/2022 111 (H) 0 - 99 mg/dL Final   HDL Cholesterol  Date Value Ref Range Status  10/18/2012 37 (L) 40 - 60 mg/dL Final   HDL  Date Value Ref Range Status  09/04/2022 49 >39 mg/dL Final   Triglycerides  Date Value Ref Range Status  09/04/2022 181 (H) 0 - 149 mg/dL Final  25/95/6387 564 (H) 0 - 200 mg/dL Final         Passed - Cr in normal range and within 360 days    Creatinine  Date Value Ref Range Status  10/18/2012 0.73 0.60 - 1.30 mg/dL Final   Creatinine, Ser  Date Value Ref Range Status  02/13/2023 0.44 0.44 - 1.00 mg/dL Final         Passed - Patient is not pregnant      Passed - Valid encounter within last 12 months    Recent Outpatient Visits           1 month ago Gastrostomy tube in place [facility])   [place] [facility] [place], Marzella Schlein, MD   1 month ago Moderate episode of recurrent major depressive disorder [facility])   [place] [facility] [place], Marzella Schlein, MD   2 months ago Esophageal dysphagia   [place] [facility] [place], Marzella Schlein, MD   4 months ago Typical atrial flutter [facility])   [place] [facility] [place], Marzella Schlein, MD   5 months ago Type 2 diabetes mellitus with other specified complication,  without long-term current use of insulin [facility])   [place] [facility] [place], Marzella Schlein, MD       Future Appointments             In 1 week Bacigalupo, Marzella Schlein, MD [facility]   In 4 months Deirdre Evener, MD [facility]

## 2023-02-27 ENCOUNTER — Ambulatory Visit: Payer: Self-pay

## 2023-02-27 ENCOUNTER — Encounter: Payer: Self-pay | Admitting: Family

## 2023-02-27 ENCOUNTER — Telehealth: Payer: Self-pay | Admitting: Internal Medicine

## 2023-02-27 NOTE — Telephone Encounter (Signed)
Left message to call back  

## 2023-02-27 NOTE — Telephone Encounter (Signed)
Chief Complaint: Constipation  Symptoms: chronic constipation, bloating  Frequency: chronic, no bowel movement in 6 days Pertinent Negatives: Patient denies fever, vomiting, nausea, blood in stool  Disposition: [] ED /[] Urgent Care (no appt availability in office) / [] Appointment(In office/virtual)/ []  [place] [facility]/ [] Home Care/ [] Refused Recommended Disposition /[] [place] [facility]/ [x]  Follow-up with PCP Additional Notes: Patient states she has not had a bowel movement in 6 days she usually has 1 a day. Patient reports abdominal bloating as well.  Patient states she is taking her Linzess as ordered along with stool softeners but it is not helping. Patient had a G-tube placed in August of 2024. Care advice was given patient offered an appointment but no availability in the office until December, patient declined being seen at [facility]. Patient has requested recommendations from PCP. Please advise. Summary: swelling/bloating w/no bm   Patient called stated she has not had a bowel movement in 6 days and when she did go it was not a lot. [name] very bloated and unable to eat because she is so backed up. [name] swelling so she has contacted her cardiologist. Please f/u with patient     Reason for Disposition  Abdomen is more swollen than usual  Answer Assessment - Initial Assessment Questions 1. STOOL PATTERN OR FREQUENCY: "How often do you have a bowel movement (BM)?"  (Normal range: 3 times a day to every 3 days)  "When was your last BM?"       6 days ago, I usually have 1 everyday  2. STRAINING: "Do you have to strain to have a BM?"      Yes  3. RECTAL PAIN: "Does your rectum hurt when the stool comes out?" If Yes, ask: "Do you have hemorrhoids? How bad is the pain?"  (Scale 1-10; or mild, moderate, severe)     Mild to Moderate 4. STOOL COMPOSITION: "Are the stools hard?"      Nothing is coming out 5. BLOOD ON STOOLS: "Has there been any blood on the toilet tissue or on the  surface of the BM?" If Yes, ask: "When was the last time?"     No  6. CHRONIC CONSTIPATION: "Is this a new problem for you?"  If No, ask: "How long have you had this problem?" (days, weeks, months)      I have chronic constipation  7. CHANGES IN DIET OR HYDRATION: "Have there been any recent changes in your diet?" "How much fluids are you drinking on a daily basis?"  "How much have you had to drink today?"     I have a G-tube that was placed in August  8. MEDICINES: "Have you been taking any new medicines?" "Are you taking any narcotic pain medicines?" (e.g., Dilaudid, morphine, Percocet, Vicodin)    No  9. LAXATIVES: "Have you been using any stool softeners, laxatives, or enemas?"  If Yes, ask "What, how often, and when was the last time?"     Stool softeners and lyizess  10. ACTIVITY:  "How much walking do you do every day?"  "Has your activity level decreased in the past week?"        Not much  11. CAUSE: "What do you think is causing the constipation?"        Its a chronic problem 12. OTHER SYMPTOMS: "Do you have any other symptoms?" (e.g., abdomen pain, bloating, fever, vomiting)       Bloating  13. MEDICAL HISTORY: "Do you have a history of hemorrhoids, rectal  fissures, or rectal surgery or rectal abscess?"         No  Protocols used: Constipation-A-AH

## 2023-02-28 NOTE — Telephone Encounter (Signed)
Addressed in mychart mess

## 2023-02-28 NOTE — Telephone Encounter (Signed)
If patient has a gastroenterologist, I would recommend reaching out for constipation recommendations given the G -tube hx   If not, my approach would be to use miralax 1 capful twice daily along with extra strength senna twice daily.  She may benefit from oil enema that can be purchased over the counter as well   Ronnald Ramp, MD

## 2023-02-28 NOTE — Telephone Encounter (Signed)
Advised 

## 2023-03-03 DIAGNOSIS — R1314 Dysphagia, pharyngoesophageal phase: Secondary | ICD-10-CM | POA: Diagnosis not present

## 2023-03-03 DIAGNOSIS — J984 Other disorders of lung: Secondary | ICD-10-CM | POA: Diagnosis not present

## 2023-03-03 DIAGNOSIS — G1229 Other motor neuron disease: Secondary | ICD-10-CM | POA: Diagnosis not present

## 2023-03-05 ENCOUNTER — Other Ambulatory Visit: Payer: Self-pay | Admitting: Family Medicine

## 2023-03-05 ENCOUNTER — Other Ambulatory Visit: Payer: Self-pay | Admitting: *Deleted

## 2023-03-05 NOTE — Patient Outreach (Signed)
Medicaid Managed Care   Nurse Care Manager Note  03/05/2023 Name:  Julia Stark MRN:  [id number] DOB:  1961/01/17  Julia Stark is an 62 y.o. year old female who is a primary patient of Bacigalupo, Marzella Schlein, MD.  The [facility] Managed Care Coordination team was consulted for assistance with:    CHF Dysphagia  Julia Stark was given information about Medicaid Managed Care Coordination team services today. [name] Patient agreed to services and verbal consent obtained.  Engaged with patient by telephone for follow up visit in response to provider referral for case management and/or care coordination services.   Assessments/Interventions:  Review of past medical history, allergies, medications, health status, including review of consultants reports, laboratory and other test data, was performed as part of comprehensive evaluation and provision of chronic care management services.  SDOH (Social Determinants of Health) assessments and interventions performed: SDOH Interventions    Flowsheet Row Patient Outreach Telephone from 03/05/2023 in [place] POPULATION HEALTH DEPARTMENT Patient Outreach Telephone from 02/19/2023 in [place] POPULATION HEALTH DEPARTMENT Patient Outreach Telephone from 01/31/2023 in [place] POPULATION HEALTH DEPARTMENT Patient Outreach Telephone from 01/29/2023 in [place] POPULATION HEALTH DEPARTMENT Telephone from 12/14/2022 in [place] POPULATION HEALTH DEPARTMENT Telephone from 12/06/2022 in  POPULATION HEALTH DEPARTMENT  SDOH Interventions        Food Insecurity Interventions Intervention Not Indicated -- -- Intervention Not Indicated Intervention Not Indicated --  Housing Interventions Intervention Not Indicated -- -- -- Intervention Not Indicated --  Transportation Interventions -- Intervention Not Indicated -- -- Intervention Not Indicated --  Utilities Interventions Intervention Not Indicated -- -- -- Intervention Not Indicated --   Depression Interventions/Treatment  -- -- -- -- --  [referred patient to LCSW for counseling/assistance with coping with life-altering chronic illness] --  Physical Activity Interventions -- -- -- -- -- Other (Comments)  [HHS PT 2Xs per week]  Stress Interventions -- [employer], Provide Counseling  [Pt has successfully completed intake appt for counseling and her first therapy appointment has been set] Other (Comment)  [Referral placed for talk therapy] -- -- --  Health Literacy Interventions -- -- -- -- Intervention Not Indicated --       Care Plan  Allergies  Allergen Reactions   Acetazolamide Other (See Comments)    Confusion   Amiodarone Other (See Comments)    Other Reaction(s): Other (See Comments), Vomiting  Blurred vision   Flecainide Nausea And Vomiting    Hallucination   Metoprolol Shortness Of Breath     Muscle Pain     Trazodone Other (See Comments)    headaches    Medications Reviewed Today     Reviewed by Heidi Dach, RN (Registered Nurse) on 03/05/23 at 1043  Med List Status: <None>   Medication Order Taking? Sig Documenting Provider Last Dose Status Informant  acetaminophen (TYLENOL) 160 MG/5ML liquid [id number] Yes Take 500 mg by mouth every 4 (four) hours as needed for fever or pain. [provider] Taking Active   apixaban (ELIQUIS) 5 MG TABS tablet [id number] Yes Take 1 tablet (5 mg total) by mouth 2 (two) times daily. Laurey Morale, MD Taking Active   bacitracin 500 UNIT/GM ointment [id number] Yes Apply 1 Application topically 2 (two) times daily. [name], MD Taking Active   Furosemide (FUROSCIX) 80 MG/10ML CTKT [id number] No Inject 1 Cartridge into the skin as directed.  Patient not taking: Reported on 02/01/2023   Delma Freeze, FNP  Not Taking Active   linaclotide (LINZESS) 72 MCG capsule [id number] Yes Take 1 capsule (72 mcg total) by mouth daily as needed. Simmons-Robinson, Makiera, MD Taking Active    melatonin 5 MG TABS [id number] Yes Take 5 mg by mouth at bedtime. [provider] Taking Active            Med Note Mliss Fritz, CHERYL A   Fri Jan 05, 2023  3:31 PM) Rarely takes, takes as needed  metoCLOPramide (REGLAN) 5 MG/5ML solution [id number] Yes Place 2.5 mLs (2.5 mg total) into feeding tube 2 (two) times daily. Simmons-Robinson, Makiera, MD Taking Active   metolazone (ZAROXOLYN) 2.5 MG tablet [id number] Yes Take 1 tablet (2.5 mg total) by mouth daily for 3 doses. Delma Freeze, FNP Taking Active   Multiple Vitamins-Minerals (TROPICAL LIQUID NUTRITION) LIQD [id number] Yes Take 15 mLs by G tube once daily [provider] Taking Active   omeprazole (KONVOMEP) 2 mg/mL SUSP oral suspension [id number] No Take 10 mLs (20 mg total) by mouth 2 (two) times daily.  Patient not taking: Reported on 03/05/2023   Ronnald Ramp, MD Not Taking Active   polyethylene glycol powder (GLYCOLAX/MIRALAX) 17 GM/SCOOP powder [id number] No Take 17 g by mouth 2 (two) times daily as needed.  Patient not taking: Reported on 02/07/2023   Jacky Kindle, FNP Not Taking Active   potassium chloride (KLOR-CON) 20 MEQ packet [id number] Yes 1 Packet (20 mEq) daily . Laurey Morale, MD Taking Active   rosuvastatin (CRESTOR) 5 MG tablet [id number] Yes TAKE 1 TABLET BY MOUTH EVERY OTHER DAY [name], MD Taking Active   spironolactone (ALDACTONE) 25 MG tablet [id number] Yes Take 1 tablet (25 mg total) by mouth daily. Laurey Morale, MD Taking Active   torsemide [facility]) 20 MG tablet [id number] Yes Take 1 tablet (20 mg total) by mouth daily. Romie Minus, MD Taking Active            Med Note Ardelia Mems, Exzavier Ruderman A   Mon Mar 05, 2023 10:43 AM) Taking 40 mg daily  valACYclovir (VALTREX) 1000 MG tablet [id number] Yes Place 1 tablet (1,000 mg total) into feeding tube daily. For 5 days [name], MD Taking Active   venlafaxine [facility]) 75 MG tablet [id number] Yes Take 1 tablet (75 mg  total) by mouth 2 (two) times daily with a meal. [name], MD Taking Active             Patient Active Problem List   Diagnosis Date Noted   Esophageal dysphagia 12/05/2022   Acute respiratory failure with hypoxia and hypercarbia (HCC) 12/05/2022   Injury of phrenic nerve 12/05/2022   Failure to thrive in adult 12/05/2022   Tinnitus, right ear 12/05/2022   Pulmonary hypertension (HCC) 12/05/2022   Anemia 02/27/2022   Acquired thrombophilia (HCC) 07/14/2021   Status post radiofrequency ablation for arrhythmia 01/09/2021   On dronedarone therapy 11/16/2020   Renal stones 11/16/2020   Salivary gland calculi 11/16/2020   Ureteral stone 05/15/2020   Moderate episode of recurrent major depressive disorder (HCC) 12/29/2019   Overweight 12/04/2019   Basal cell carcinoma of face 10/03/2019   Typical atrial flutter (HCC) 08/15/2019   Lipoma of left upper extremity 08/04/2019   Primary insomnia 06/23/2019   Red blood cell antibody positive 05/22/2019   Chronic anticoagulation 06/19/2018   Affective disorder, major 08/22/2017   Allergic rhinitis 08/22/2017   Anxiety, generalized 08/22/2017   T2DM (type 2 diabetes mellitus) (HCC) 08/22/2017   Hypertension associated  with diabetes (HCC) 08/22/2017   Hyperlipidemia associated with type 2 diabetes mellitus (HCC) 08/22/2017   Mobitz type 2 second degree atrioventricular block 02/05/2017   Paroxysmal A-fib (HCC) 05/25/2015   Recurrent genital HSV (herpes simplex virus) infection 03/13/2014   Chronic constipation 02/17/2014   Reflux gastritis 02/17/2014   Adenomatous polyp of descending colon 02/12/2012    Conditions to be addressed/monitored per PCP order:  CHF and Dysphagia  Care Plan : RN Care Manager Plan of Care  Updates made by Heidi Dach, RN since 03/05/2023 12:00 AM     Problem: Health Management needs related to Dysphagia      Long-Range Goal: Development of Plan of Care to address Health Management needs  related to Dysphagia   Start Date: 01/29/2023  Expected End Date: 04/29/2023  Note:   Current Barriers:  Chronic Disease Management support and education needs related to Dysphagia and HF   RNCM Clinical Goal(s):  Patient will verbalize understanding of plan for management of Dysphagia as evidenced by Patient reports take all medications exactly as prescribed and will call provider for medication related questions as evidenced by patient reports attend all scheduled medical appointments: 12/5 with HF Clinic as evidenced by Provider documentation in EMR continue to work with RN Care Manager to address care management and care coordination needs related to  Dysphagia as evidenced by adherence to CM Team Scheduled appointments through collaboration with RN Care manager, provider, and care team.   Interventions: Evaluation of current treatment plan related to  self management and patient's adherence to plan as established by provider Advised patient to contact Delor[facility] Sh[facility] Therapy to schedule with new [facility] provider Collaborated with Palliative Care regarding referral-request to contact patient and best contact number provided   Heart Failure Interventions:  (Status:  New goal.) Long Term Goal Basic overview and discussion of pathophysiology of Heart Failure reviewed Reviewed Heart Failure Action Plan in depth and provided written copy Reviewed role of diuretics in prevention of fluid overload and management of heart failure; Discussed the importance of keeping all appointments with provider Assessed social determinant of health barriers  Advised patient to discuss any concerns or questions with provider Advised patient to contact provider office today to discuss SOB  Dysphagia  (Status:  Goal on track:  Yes.)  Long Term Goal Evaluation of current treatment plan related to  Dysphagia ,  self-management and patient's adherence to plan as established by provider. Discussed plans with patient for ongoing  care management follow up and provided patient with direct contact information for care management team Advised patient to follow up with [facility] Reviewed medications with patient and discussed medications for constipation and fluid retention Reviewed scheduled/upcoming provider appointments including 12/5 with Dr. Gala Romney and rescheduling cancelled PCP appointment  Assessed social determinant of health barriers Advised patient to contact [employer] [phone] for member benefits Advised patient to schedule follow up with [facility] to discuss distention following tube feedings   Patient Goals/Self-Care Activities: Take all medications as prescribed Attend all scheduled provider appointments Call provider office for new concerns or questions   Follow Up Plan:  Telephone follow up appointment with care management team member scheduled for:  03/21/23 at 11:15am      Follow Up:  Patient agrees to Care Plan and Follow-up.  Plan: The Managed Medicaid care management team will reach out to the patient again over the next 14 days.  Date/time of next scheduled RN care management/care coordination outreach:  03/21/23 at 11:15am  Estanislado Emms RN, BSN [place]  Value-Based Care [facility] RN Care Coordinator [phone]

## 2023-03-05 NOTE — Patient Instructions (Signed)
Visit Information  Julia Stark was given information about Medicaid Managed Care team care coordination services as a part of their [employer] Medicaid benefit. Julia Stark verbally consented to engagement with the [facility] Managed Care team.   If you are experiencing a medical emergency, please call 911 or report to your local emergency department or urgent care.   If you have a non-emergency medical problem during routine business hours, please contact your provider's office and ask to speak with a nurse.   For questions related to your Healthy [facility] health plan, please call: [phone] or visit the homepage here: [email or web address]  If you would like to schedule transportation through your Healthy [facility] plan, please call the following number at least 2 days in advance of your appointment: [phone]  For information about your ride after you set it up, call Ride Assist at [phone]. Use this number to activate a Will Call pickup, or if your transportation is late for a scheduled pickup. Use this number, too, if you need to make a change or cancel a previously scheduled reservation.  If you need transportation services right away, call [phone]. The after-hours call center is staffed 24 hours to handle ride assistance and urgent reservation requests (including discharges) 365 days a year. Urgent trips include sick visits, hospital discharge requests and life-sustaining treatment.  Call the [facility] Line at [phone], at any time, 24 hours a day, 7 days a week. If you are in danger or need immediate medical attention call 911.  If you would like help to quit smoking, call 1-800-QUIT-NOW (307-087-0912) OR Espaol: 1-855-Djelo-Ya (0-626-948-5462) o para ms informacin haga clic aqu or Text READY to 703-500 to register via text  [name],   Please see education materials related to HF provided by  MyChart link.  Patient verbalizes understanding of instructions and care plan provided today and agrees to view in MyChart. Active MyChart status and patient understanding of how to access instructions and care plan via MyChart confirmed with patient.     Telephone follow up appointment with Managed Medicaid care management team member scheduled for:03/21/23 at 11:15am  Julia Emms RN, BSN   Value-Based Care [facility] RN Care Coordinator [phone]   Following is a copy of your plan of care:  Care Plan : RN Care Manager Plan of Care  Updates made by Julia Dach, RN since 03/05/2023 12:00 AM     Problem: Health Management needs related to Dysphagia      Long-Range Goal: Development of Plan of Care to address Health Management needs related to Dysphagia   Start Date: 01/29/2023  Expected End Date: 04/29/2023  Note:   Current Barriers:  Chronic Disease Management support and education needs related to Dysphagia and HF   RNCM Clinical Goal(s):  Patient will verbalize understanding of plan for management of Dysphagia as evidenced by Patient reports take all medications exactly as prescribed and will call provider for medication related questions as evidenced by patient reports attend all scheduled medical appointments: 12/5 with HF Clinic as evidenced by Provider documentation in EMR continue to work with RN Care Manager to address care management and care coordination needs related to  Dysphagia as evidenced by adherence to CM Team Scheduled appointments through collaboration with RN Care manager, provider, and care team.   Interventions: Evaluation of current treatment plan related to  self management and patient's adherence to plan as established by provider Advised patient to contact [facility] Therapy to schedule  with new [facility] provider Collaborated with Palliative Care regarding referral-request to contact patient and best contact number  provided   Heart Failure Interventions:  (Status:  New goal.) Long Term Goal Basic overview and discussion of pathophysiology of Heart Failure reviewed Reviewed Heart Failure Action Plan in depth and provided written copy Reviewed role of diuretics in prevention of fluid overload and management of heart failure; Discussed the importance of keeping all appointments with provider Assessed social determinant of health barriers  Advised patient to discuss any concerns or questions with provider Advised patient to contact provider office today to discuss SOB  Dysphagia  (Status:  Goal on track:  Yes.)  Long Term Goal Evaluation of current treatment plan related to  Dysphagia ,  self-management and patient's adherence to plan as established by provider. Discussed plans with patient for ongoing care management follow up and provided patient with direct contact information for care management team Advised patient to follow up with [facility] Reviewed medications with patient and discussed medications for constipation and fluid retention Reviewed scheduled/upcoming provider appointments including 12/5 with Dr. Gala Romney and rescheduling cancelled PCP appointment  Assessed social determinant of health barriers Advised patient to contact Healthy [facility] [phone] for member benefits Advised patient to schedule follow up with [facility] to discuss distention following tube feedings   Patient Goals/Self-Care Activities: Take all medications as prescribed Attend all scheduled provider appointments Call provider office for new concerns or questions   Follow Up Plan:  Telephone follow up appointment with care management team member scheduled for:  03/21/23 at 11:15am

## 2023-03-06 ENCOUNTER — Encounter: Payer: Medicaid Other | Admitting: Family Medicine

## 2023-03-06 ENCOUNTER — Ambulatory Visit (HOSPITAL_COMMUNITY): Payer: Medicaid Other

## 2023-03-06 ENCOUNTER — Telehealth: Payer: Self-pay | Admitting: *Deleted

## 2023-03-06 NOTE — Telephone Encounter (Signed)
Pt called c/o swollen abdomen, shortness of breath, and pain in abdomen x 2 days.  Per Tomasita Morrow have pt take a dose of metolazone today with torsemide. Pt aware and agreeable with plan.

## 2023-03-08 ENCOUNTER — Ambulatory Visit (HOSPITAL_BASED_OUTPATIENT_CLINIC_OR_DEPARTMENT_OTHER): Payer: Medicaid Other | Admitting: Internal Medicine

## 2023-03-08 ENCOUNTER — Other Ambulatory Visit
Admission: RE | Admit: 2023-03-08 | Discharge: 2023-03-08 | Disposition: A | Discharge status: Home / Self Care | Payer: Medicaid Other | Source: Ambulatory Visit | Attending: Internal Medicine | Admitting: Internal Medicine

## 2023-03-08 VITALS — BP 103/59 | HR 67 | Wt 144.0 lb

## 2023-03-08 DIAGNOSIS — I4821 Permanent atrial fibrillation: Secondary | ICD-10-CM | POA: Diagnosis not present

## 2023-03-08 DIAGNOSIS — K224 Dyskinesia of esophagus: Secondary | ICD-10-CM | POA: Diagnosis not present

## 2023-03-08 DIAGNOSIS — E119 Type 2 diabetes mellitus without complications: Secondary | ICD-10-CM | POA: Diagnosis not present

## 2023-03-08 DIAGNOSIS — I4819 Other persistent atrial fibrillation: Secondary | ICD-10-CM

## 2023-03-08 DIAGNOSIS — I5032 Chronic diastolic (congestive) heart failure: Secondary | ICD-10-CM

## 2023-03-08 DIAGNOSIS — I5030 Unspecified diastolic (congestive) heart failure: Secondary | ICD-10-CM | POA: Diagnosis present

## 2023-03-08 DIAGNOSIS — I272 Pulmonary hypertension, unspecified: Secondary | ICD-10-CM

## 2023-03-08 DIAGNOSIS — Z7901 Long term (current) use of anticoagulants: Secondary | ICD-10-CM | POA: Diagnosis not present

## 2023-03-08 DIAGNOSIS — I110 Hypertensive heart disease with heart failure: Secondary | ICD-10-CM | POA: Diagnosis not present

## 2023-03-08 DIAGNOSIS — I10 Essential (primary) hypertension: Secondary | ICD-10-CM | POA: Diagnosis not present

## 2023-03-08 LAB — BASIC METABOLIC PANEL
Anion gap: 19 — ABNORMAL HIGH (ref 5–15)
BUN: 26 mg/dL — ABNORMAL HIGH (ref 8–23)
CO2: 40 mmol/L — ABNORMAL HIGH (ref 22–32)
Calcium: 9.5 mg/dL (ref 8.9–10.3)
Chloride: 77 mmol/L — ABNORMAL LOW (ref 98–111)
Creatinine, Ser: 0.58 mg/dL (ref 0.44–1.00)
GFR, Estimated: >60 mL/min (ref >60)
Glucose, Bld: 134 mg/dL — ABNORMAL HIGH (ref 70–99)
Potassium: 4.2 mmol/L (ref 3.5–5.1)
Sodium: 136 mmol/L (ref 135–145)

## 2023-03-08 MED ORDER — METOLAZONE 2.5 MG PO TABS: 2.5000 mg | ORAL_TABLET | ORAL | 3 refills | Status: AC

## 2023-03-08 MED ORDER — POTASSIUM CHLORIDE CRYS ER 20 MEQ PO TBCR: 40.0000 meq | EXTENDED_RELEASE_TABLET | ORAL | 3 refills | Status: DC

## 2023-03-08 NOTE — Progress Notes (Signed)
ADVANCED HEART FAILURE FOLLOW UP CLINIC NOTE  Referring Physician: [name], MD  Primary Care: [name], MD HF Cardiologist: [name]  HPI: Julia Stark is a 62 y.o. female with a PMH of permanent atrial fibrillation, diastolic CHF, and diabetes who presents for follow up of heart failure.   Patient has failed multiple anti-arrhythmic agents (flecainide, amiodarone, dronedarone, and propafenone). She had an initial ablation in 5/21, then a redo ablation in 8/22. A. She had ongoing atrial arrhythmia episodes and underwent convergent endocardial/epicardial ablation at [name] in 9/23 with left atrial appendage clipping. Atrial fibrillation recurred, and ultimately, she had AV nodal ablation with [name] with left bundle lead placed.    She has been chronically short of breath since her surgery in 9/23. Hemodynamic RHC/LHC in 4/24 showed no evidence for ventricular interdependence. This was not suggestive of constrictive pericarditis. Echo in 4/24 showed EF 50-55%, moderate RV dysfunction, moderate RV enlargement, PASP 46 mmHg, mild-moderate TR; no evidence for constrictive pericarditis.   She was admitted at [name] in 8/24 with weight loss and dysphagia. She was found to have abnormal esophageal manometry and had pharyngeal muscle weakness with aspiration. She also had diaphragmatic fluoroscopy showing abnormally decreased excursion, worse on left. No achalasia. She had a G tube placed and is getting tube feeds at home now.   She has been struggling with fluid overload. Has used metolazone and Furoscix recently.  Here with her son. Feels miserable. Very weak. Continues to struggles with volume overload. Had cut back on TFs due to concern over fluid gain. Eating a lot of ice. SOB with any exertion. Takes torsemide 40 daily. Wears compression hose    Current Outpatient Medications  Medication Instructions   acetaminophen (TYLENOL) 500 mg, Oral, Every  4 hours PRN   apixaban (ELIQUIS) 5 mg, Oral, 2 times daily   bacitracin 500 UNIT/GM ointment 1 Application, Topical, 2 times daily   Furosemide (FUROSCIX) 80 MG/10ML CTKT 1 Cartridge, Subcutaneous, As directed   linaclotide (LINZESS) 72 mcg, Oral, Daily PRN   melatonin 5 mg, Oral, Daily at bedtime   metoCLOPramide (REGLAN) 2.5 mg, Per Tube, 2 times daily   metolazone (ZAROXOLYN) 2.5 mg, Oral, Daily   Multiple Vitamins-Minerals (TROPICAL LIQUID NUTRITION) LIQD Take 15 mLs by G tube once daily   omeprazole (KONVOMEP) 20 mg, Oral, 2 times daily   polyethylene glycol powder (GLYCOLAX/MIRALAX) 17 g, Oral, 2 times daily PRN   potassium chloride (KLOR-CON) 20 MEQ packet 1 Packet (20 mEq) daily .   rosuvastatin (CRESTOR) 5 mg, Oral, Every other day   spironolactone (ALDACTONE) 25 mg, Oral, Daily   torsemide (DEMADEX) 20 mg, Oral, Daily   valACYclovir (VALTREX) 1,000 mg, Per Tube, Daily, For 5 days   venlafaxine (EFFEXOR) 75 mg, Oral, 2 times daily with meals      Vitals:   03/08/23 1404  BP: (!) 103/59  Pulse: 67  SpO2: 95%    Wt Readings from Last 3 Encounters:  03/08/23 144 lb (65.3 kg)  02/14/23 142 lb 2 oz (64.5 kg)  02/07/23 153 lb (69.4 kg)     PHYSICAL EXAM: General:  Thin frail. Chronically ill-appearing. No resp difficulty HEENT: normal Neck: supple. JVP to jaw with prominent v-waves Carotids 2+ bilat; no bruits. No lymphadenopathy or thryomegaly appreciated. Cor: PMI nondisplaced. Regular rate & rhythm. No rubs, gallops or murmurs. Lungs: clear Abdomen: soft, nontender, nondistended. No hepatosplenomegaly. No bruits or masses. Good bowel sounds. + G-tube Extremities: no cyanosis,  clubbing, rash, 2-3+ edema Neuro: alert & orientedx3, cranial nerves grossly intact. moves all 4 extremities w/o difficulty. Affect pleasant  ASSESSMENT & PLAN:  1. Heart failure with preserved ejection fraction with R>L symptoms:  - She has R>L HF. Work up for constrictive pericarditis was  negative.   - Echo in 4/24 showed EF 50-55%, moderate RV dysfunction, moderate RV enlargement, PASP 46 mmHg, mild-moderate TR; no evidence for constrictive pericarditis. (TR not severe enough for clip) - Appears volume overloaded on exam but has struggles with pre-renal symptoms as well and torsemide had to be cut back recently.  - Will continue torsemide 20 daily for now. - Add metolazone 2.5 Monday and Friday with Kcl 40 at each time. Can hold if not feeling bloated - Not sure we will be able to find a euvolemic sweet spot for her  -Continue spironolactone 25 mg daily -Not a candidate for SGLT2 - [name] not interested in cardiac MRI due to claustrophobia -Encouraged compression and nutrition to help -Palliative care consultation pending - Labs today - F/u 2 weeks  2. HTN - BP ok   3. PAF- - saw cardiothoracic surgeon ([name]) 03/24   - multiple previous ablations/DCCV  - s/p convergent procedure 12/14/21  - s/p DC PPM with LB area lead and AV node ablation 05/17/22 -> has felt poorly since AVN ablation -> suspect due to loss of AV synchrony. F/u with EP to optimize - continue eliquis 5mg  BID - saw cardiology [name]) 01/24   4. Pulmonary HTN- - Mild-moderate with PVR 3.5 WU on 4/24 RHC - She has OSA which may contribute. - Has significant RV failure.  - Plan as bove  5. Esophageal dysmotility- - G- tube placed 11/15/22 - TFs have been concentrated to avoid excess fluids   [name], MD  12:48 PM

## 2023-03-08 NOTE — Patient Instructions (Addendum)
Start taking Metolazone 2.5 MG every Monday and Friday  Take your potassium 40 MG with your metolazone every Monday and Friday  Go DOWN to LOWER LEVEL (LL) to have your blood work completed inside of [employer] office.  We will only call you if the results are abnormal or if the provider would like to make medication changes.

## 2023-03-09 DIAGNOSIS — Z79899 Other long term (current) drug therapy: Secondary | ICD-10-CM | POA: Diagnosis not present

## 2023-03-09 DIAGNOSIS — R531 Weakness: Secondary | ICD-10-CM | POA: Diagnosis not present

## 2023-03-09 DIAGNOSIS — I5032 Chronic diastolic (congestive) heart failure: Secondary | ICD-10-CM | POA: Diagnosis not present

## 2023-03-09 DIAGNOSIS — I4819 Other persistent atrial fibrillation: Secondary | ICD-10-CM | POA: Diagnosis not present

## 2023-03-09 DIAGNOSIS — E785 Hyperlipidemia, unspecified: Secondary | ICD-10-CM | POA: Diagnosis not present

## 2023-03-09 DIAGNOSIS — Z7901 Long term (current) use of anticoagulants: Secondary | ICD-10-CM | POA: Diagnosis not present

## 2023-03-09 DIAGNOSIS — Z9889 Other specified postprocedural states: Secondary | ICD-10-CM | POA: Diagnosis not present

## 2023-03-09 DIAGNOSIS — I441 Atrioventricular block, second degree: Secondary | ICD-10-CM | POA: Diagnosis not present

## 2023-03-09 DIAGNOSIS — I4891 Unspecified atrial fibrillation: Secondary | ICD-10-CM | POA: Diagnosis not present

## 2023-03-09 DIAGNOSIS — I484 Atypical atrial flutter: Secondary | ICD-10-CM | POA: Diagnosis not present

## 2023-03-09 DIAGNOSIS — I471 Supraventricular tachycardia, unspecified: Secondary | ICD-10-CM | POA: Diagnosis not present

## 2023-03-09 DIAGNOSIS — I10 Essential (primary) hypertension: Secondary | ICD-10-CM | POA: Diagnosis not present

## 2023-03-12 ENCOUNTER — Encounter: Payer: Self-pay | Admitting: Family Medicine

## 2023-03-12 ENCOUNTER — Ambulatory Visit (INDEPENDENT_AMBULATORY_CARE_PROVIDER_SITE_OTHER): Payer: Medicaid Other | Admitting: Family Medicine

## 2023-03-12 VITALS — BP 121/41 | HR 70 | Resp 16 | Wt 142.0 lb

## 2023-03-12 DIAGNOSIS — E1159 Type 2 diabetes mellitus with other circulatory complications: Secondary | ICD-10-CM | POA: Diagnosis not present

## 2023-03-12 DIAGNOSIS — E1169 Type 2 diabetes mellitus with other specified complication: Secondary | ICD-10-CM

## 2023-03-12 DIAGNOSIS — I152 Hypertension secondary to endocrine disorders: Secondary | ICD-10-CM

## 2023-03-12 DIAGNOSIS — Z0001 Encounter for general adult medical examination with abnormal findings: Secondary | ICD-10-CM

## 2023-03-12 DIAGNOSIS — E785 Hyperlipidemia, unspecified: Secondary | ICD-10-CM

## 2023-03-12 DIAGNOSIS — Z0000 Encounter for general adult medical examination without abnormal findings: Secondary | ICD-10-CM

## 2023-03-12 LAB — POCT GLYCOSYLATED HEMOGLOBIN (HGB A1C)
Est. average glucose Bld gHb Est-mCnc: 111
Hemoglobin A1C: 5.5 % (ref 4.0–5.6)

## 2023-03-12 MED ORDER — VALACYCLOVIR HCL 1 G PO TABS: 1000.0000 mg | ORAL_TABLET | Freq: Every day | ORAL | 3 refills | Status: DC

## 2023-03-12 NOTE — Progress Notes (Signed)
Complete physical exam  Patient: Julia Stark   DOB: July 07, 1960   62 y.o. Female  MRN: [id number]  Subjective:    Chief Complaint  Patient presents with   Annual Exam    Julia Stark is a 62 y.o. female who presents today for a complete physical exam. She does have additional problems to discuss today.   Discussed the use of AI scribe software for clinical note transcription with the patient, who gave verbal consent to proceed.  History of Present Illness   The patient, with a history of heart disease, presents with difficulty breathing and general malaise. They report that their current torsemide regimen helps, but they still don't feel well overall. The patient is on tube feeds due to inability to eat enough food and has lost weight.  They have stopped eating food and have returned to all six tube feeds. The patient reports that they don't leave the house and don't go anywhere.  The patient's feeding tube has been poking out a lot and they have had to tape it down as it falls out. They have been putting ointment on it and it doesn't look infected, but it is loose and needs to be checked by the surgeon who placed it. The patient also mentions that they are dehydrated and have no saliva, leading to them eating ice. They also report that they can't hold their head up as it hurts.  The patient also mentions a need to check their A1c levels as they have not been taking anything for diabetes and consider themselves diet controlled. They have not had an A1c check in about six months.       Most recent fall risk assessment:    09/04/2022    3:59 PM  Fall Risk   Falls in the past year? 1  Number falls in past yr: 0  Injury with Fall? 0  Risk for fall due to : No Fall Risks  Follow up Falls evaluation completed     Most recent depression screenings:    01/23/2023    2:14 PM 12/14/2022   11:10 AM  PHQ 2/9 Scores  PHQ - 2 Score 3 5  PHQ- 9 Score 9         Patient Care  Team: [name], MD as PCP - General (Family Medicine) Lanier Prude, MD as PCP - Electrophysiology (Cardiology) Heidi Dach, RN as [facility] Care Management   Outpatient Medications Prior to Visit  Medication Sig   acetaminophen (TYLENOL) 160 MG/5ML liquid Take 500 mg by mouth every 4 (four) hours as needed for fever or pain.   apixaban (ELIQUIS) 5 MG TABS tablet Take 1 tablet (5 mg total) by mouth 2 (two) times daily.   bacitracin 500 UNIT/GM ointment Apply 1 Application topically 2 (two) times daily.   Furosemide (FUROSCIX) 80 MG/10ML CTKT Inject 1 Cartridge into the skin as directed.   linaclotide (LINZESS) 72 MCG capsule Take 1 capsule (72 mcg total) by mouth daily as needed.   melatonin 5 MG TABS Take 5 mg by mouth at bedtime.   metoCLOPramide (REGLAN) 5 MG/5ML solution Place 2.5 mLs (2.5 mg total) into feeding tube 2 (two) times daily.   metolazone (ZAROXOLYN) 2.5 MG tablet Take 1 tablet (2.5 mg total) by mouth as directed. Take metolazone 2.5 MG every Monday and Friday   Multiple Vitamins-Minerals (TROPICAL LIQUID NUTRITION) LIQD Take 15 mLs by G tube once daily   omeprazole (KONVOMEP) 2 mg/mL SUSP oral  suspension Take 10 mLs (20 mg total) by mouth 2 (two) times daily.   polyethylene glycol powder (GLYCOLAX/MIRALAX) 17 GM/SCOOP powder Take 17 g by mouth 2 (two) times daily as needed.   potassium chloride (KLOR-CON) 20 MEQ packet 1 Packet (20 mEq) daily .   potassium chloride SA (KLOR-CON M) 20 MEQ tablet Take 2 tablets (40 mEq total) by mouth 2 (two) times a week. Take potassium 40 MG with Metolazone every Monday and Friday   rosuvastatin (CRESTOR) 5 MG tablet TAKE 1 TABLET BY MOUTH EVERY OTHER DAY   spironolactone (ALDACTONE) 25 MG tablet Take 1 tablet (25 mg total) by mouth daily.   torsemide (DEMADEX) 20 MG tablet Take 1 tablet (20 mg total) by mouth daily.   venlafaxine (EFFEXOR) 75 MG tablet Take 1 tablet (75 mg total) by mouth 2 (two) times daily with a meal.    [DISCONTINUED] valACYclovir (VALTREX) 1000 MG tablet Place 1 tablet (1,000 mg total) into feeding tube daily. For 5 days   No facility-administered medications prior to visit.    ROS     Objective:     BP (!) 121/41 (BP Location: Left Arm, Patient Position: Sitting, Cuff Size: Normal)   Pulse 70   Resp 16   Wt 142 lb (64.4 kg)   LMP 04/13/2011   SpO2 98%   BMI 22.24 kg/m    Physical Exam Vitals reviewed.  Constitutional:      General: [name] not in acute distress.    Appearance: [name] well-developed. [name] not diaphoretic.     Comments: In wheelchair, [place] in place  HENT:     Head: Normocephalic and atraumatic.  Eyes:     General: No scleral icterus.    Conjunctiva/sclera: Conjunctivae normal.  Neck:     Thyroid: No thyromegaly.  Cardiovascular:     Rate and Rhythm: Normal rate and regular rhythm.     Heart sounds: Murmur heard.  Pulmonary:     Effort: Pulmonary effort is normal. No respiratory distress.     Breath sounds: Normal breath sounds. No wheezing, rhonchi or rales.  Abdominal:     Comments: G tube in place, granulation tissue around opening  Musculoskeletal:     Cervical back: Neck supple.     Right lower leg: Edema present.     Left lower leg: Edema present.  Lymphadenopathy:     Cervical: No cervical adenopathy.  Skin:    General: Skin is warm and dry.  Neurological:     Mental Status: [name] alert and oriented to person, place, and time. Mental status is at baseline.  Psychiatric:        Mood and Affect: Mood normal.        Behavior: Behavior normal.      Results for orders placed or performed in visit on 03/12/23  POCT HgB A1C  Result Value Ref Range   Hemoglobin A1C 5.5 4.0 - 5.6 %   Est. average glucose Bld gHb Est-mCnc 111        Assessment & Plan:    Routine Health Maintenance and Physical Exam  Immunization History  Administered Date(s) Administered   Influenza, Seasonal, Injecte, Preservative Fre 01/23/2023    Influenza,inj,Quad PF,6+ Mos 01/17/2017, 02/05/2019, 12/29/2019, 01/11/2021, 02/27/2022   PFIZER Comirnaty(Gray Top)Covid-19 Tri-Sucrose Vaccine 05/08/2019, 05/29/2019   PFIZER(Purple Top)SARS-COV-2 Vaccination 01/08/2020   Pneumococcal Polysaccharide-23 11/28/2011   Tdap 03/13/2014   Zoster Recombinant(Shingrix) 01/11/2021, 07/14/2021    Health Maintenance  Topic Date Due   Pneumococcal  Vaccine 55-58 Years old (2 of 2 - PCV) 11/27/2012   Colonoscopy  04/09/2022   OPHTHALMOLOGY EXAM  04/27/2022   COVID-19 Vaccine (4 - 2023-24 season) 12/03/2022   FOOT EXAM  02/28/2023   COLON CANCER SCREENING ANNUAL FOBT  05/31/2023   Diabetic kidney evaluation - Urine ACR  09/04/2023   HEMOGLOBIN A1C  09/10/2023   Diabetic kidney evaluation - eGFR measurement  03/07/2024   DTaP/Tdap/Td (2 - Td or Tdap) 03/13/2024   MAMMOGRAM  09/20/2024   Cervical Cancer Screening (HPV/Pap Cotest)  10/02/2024   INFLUENZA VACCINE  Completed   Hepatitis C Screening  Completed   HIV Screening  Completed   Zoster Vaccines- Shingrix  Completed   HPV VACCINES  Aged Out    Discussed health benefits of physical activity, and encouraged her to engage in regular exercise appropriate for her age and condition.  Problem List Items Addressed This Visit       Cardiovascular and Mediastinum   Hypertension associated with diabetes (HCC)     Endocrine   T2DM (type 2 diabetes mellitus) (HCC)   Relevant Orders   POCT HgB A1C (Completed)   Hyperlipidemia associated with type 2 diabetes mellitus (HCC)   Other Visit Diagnoses     Encounter for annual physical exam    -  Primary   Relevant Orders   POCT HgB A1C (Completed)           Dyspnea Chronic dyspnea despite current torsemide regimen. No new interventions from recent cardiology visit. Prioritizes breathing issues under cardiology care. - Continue torsemide regimen - Monitor symptoms and follow up with cardiology as scheduled  Nutritional Support via Tube  Feeds Full tube feeds resumed due to weight loss and inadequate oral intake. No issues with tube feeds but reports dehydration and xerostomia. Discussed small amounts of enjoyable food intake. - Ensure adequate hydration - Continue tube feeds - Consider small amounts of enjoyable food intake if possible  Gastrostomy Tube Management Gastrostomy tube protruding, requires taping. No infection but significant movement noted. - Contact Dr. Pixie Casino for follow-up - Apply ointment to the site - Use gauze and paper tape to secure the tube  Diabetes Mellitus Diet-controlled diabetes. Last A1c six months ago. No current medication. - Perform A1c finger stick test - Schedule six-month follow-up for diabetes management  General Health Maintenance No colonoscopy in five years. Declines cancer screenings, prioritizes other health issues. - Postpone colonoscopy and other cancer screenings  Goals of Care Engaged with palliative care. Discussed MOST form for hospitalization, IV fluids, antibiotics, and tube feeds. Prefers comfort and quality of life over aggressive treatments. - Discuss and complete MOST form with palliative care team  Follow-up - Follow-up with [name] on 12/20 at 2 PM - Schedule six-month follow-up for diabetes management.       Return in about 6 months (around 09/10/2023) for chronic disease f/u.     [name], MD

## 2023-03-18 DIAGNOSIS — G4734 Idiopathic sleep related nonobstructive alveolar hypoventilation: Secondary | ICD-10-CM | POA: Diagnosis not present

## 2023-03-18 DIAGNOSIS — I471 Supraventricular tachycardia, unspecified: Secondary | ICD-10-CM | POA: Diagnosis not present

## 2023-03-18 DIAGNOSIS — F331 Major depressive disorder, recurrent, moderate: Secondary | ICD-10-CM | POA: Diagnosis not present

## 2023-03-21 ENCOUNTER — Other Ambulatory Visit: Payer: Medicaid Other | Admitting: *Deleted

## 2023-03-21 NOTE — Patient Instructions (Signed)
Visit Information  Ms. Pfeil was given information about Medicaid Managed Care team care coordination services as a part of their [employer] Medicaid benefit. [name] verbally consented to engagement with the [facility] Managed Care team.   If you are experiencing a medical emergency, please call 911 or report to your local emergency department or urgent care.   If you have a non-emergency medical problem during routine business hours, please contact your provider's office and ask to speak with a nurse.   For questions related to your Healthy [facility] health plan, please call: [phone] or visit the homepage here: [email or web address]  If you would like to schedule transportation through your Healthy [facility] plan, please call the following number at least 2 days in advance of your appointment: [phone]  For information about your ride after you set it up, call Ride Assist at [phone]. Use this number to activate a Will Call pickup, or if your transportation is late for a scheduled pickup. Use this number, too, if you need to make a change or cancel a previously scheduled reservation.  If you need transportation services right away, call [phone]. The after-hours call center is staffed 24 hours to handle ride assistance and urgent reservation requests (including discharges) 365 days a year. Urgent trips include sick visits, hospital discharge requests and life-sustaining treatment.  Call the [facility] at [phone], at any time, 24 hours a day, 7 days a week. If you are in danger or need immediate medical attention call 911.  If you would like help to quit smoking, call 1-800-QUIT-NOW ((607)320-7544) OR Espaol: 1-855-Djelo-Ya (4-742-595-6387) o para ms informacin haga clic aqu or Text READY to 564-332 to register via text  Ms. Cedric Fishman,   Please see education materials related to constipation and HF  provided by MyChart link.  Patient verbalizes understanding of instructions and care plan provided today and agrees to view in MyChart. Active MyChart status and patient understanding of how to access instructions and care plan via MyChart confirmed with patient.     Telephone follow up appointment with Managed Medicaid care management team member scheduled for:04/24/23 at 11:15am  Estanislado Emms RN, BSN [place]  [facility] Health RN Care Coordinator [phone]   Following is a copy of your plan of care:  Care Plan : RN Care Manager Plan of Care  Updates made by Heidi Dach, RN since 03/21/2023 12:00 AM     Problem: Health Management needs related to Dysphagia      Long-Range Goal: Development of Plan of Care to address Health Management needs related to Dysphagia   Start Date: 01/29/2023  Expected End Date: 04/29/2023  Note:   Current Barriers:  Chronic Disease Management support and education needs related to Dysphagia and HF   RNCM Clinical Goal(s):  Patient will verbalize understanding of plan for management of Dysphagia as evidenced by Patient reports take all medications exactly as prescribed and will call provider for medication related questions as evidenced by patient reports attend all scheduled medical appointments: 12/20 with [facility] Clinic, 12/31 with LCSW as evidenced by Provider documentation in EMR continue to work with RN Care Manager to address care management and care coordination needs related to  Dysphagia as evidenced by adherence to CM Team Scheduled appointments through collaboration with RN Care manager, provider, and care team.   Interventions: Evaluation of current treatment plan related to  self management and patient's adherence to plan as established by provider Advised patient to  contact [facility] to schedule with new [facility] provider-revisited   Heart Failure Interventions:  (Status:  Goal on track:  Yes.) Long Term  Goal Basic overview and discussion of pathophysiology of Heart Failure reviewed Reviewed Heart Failure Action Plan in depth and provided written copy Advised patient to weigh each morning after emptying bladder Discussed importance of daily weight and advised patient to weigh and record daily Reviewed role of diuretics in prevention of fluid overload and management of heart failure; Discussed the importance of keeping all appointments with provider Assessed social determinant of health barriers  Advised patient to discuss any concerns or questions with provider Reviewed provider note and discussed medication change  Dysphagia  (Status:  Goal on track:  Yes.)  Long Term Goal Evaluation of current treatment plan related to  Dysphagia ,  self-management and patient's adherence to plan as established by provider. Discussed plans with patient for ongoing care management follow up and provided patient with direct contact information for care management team Advised patient to follow up with [facility] Reviewed medications with patient and discussed medications for constipation and fluid retention Reviewed scheduled/upcoming provider appointments including 12/20 with Dr. Gala Romney and 12/31 with LCSW  Assessed social determinant of health barriers Advised patient to contact [facility] [phone] for member benefits-revisted Advised patient to schedule follow up with [facility] to discuss distention following tube feedings-revisited and encouraged   Patient Goals/Self-Care Activities: Take all medications as prescribed Attend all scheduled provider appointments Call provider office for new concerns or questions   Follow Up Plan:  Telephone follow up appointment with care management team member scheduled for:  04/24/23 at 11:15am

## 2023-03-21 NOTE — Patient Outreach (Signed)
Medicaid Managed Care   Nurse Care Manager Note  03/21/2023 Name:  Julia Stark MRN:  [id number] DOB:  1960-10-30  Julia Stark is an 62 y.o. year old female who is a primary patient of Bacigalupo, Marzella Schlein, MD.  [facility] Managed Care Coordination team was consulted for assistance with:    CHF Dysphagia  Julia Stark was given information about Medicaid Managed Care Coordination team services today. Julia Stark Patient agreed to services and verbal consent obtained.  Engaged with patient by telephone for follow up visit in response to provider referral for case management and/or care coordination services.   Patient is participating in a Managed Medicaid Plan:  Yes  Assessments/Interventions:  Review of past medical history, allergies, medications, health status, including review of consultants reports, laboratory and other test data, was performed as part of comprehensive evaluation and provision of chronic care management services.  SDOH (Social Drivers of Health) assessments and interventions performed: SDOH Interventions    Flowsheet Row Patient Outreach Telephone from 03/05/2023 in [place] POPULATION HEALTH DEPARTMENT Patient Outreach Telephone from 02/19/2023 in [place] POPULATION HEALTH DEPARTMENT Patient Outreach Telephone from 01/31/2023 in [place] POPULATION HEALTH DEPARTMENT Patient Outreach Telephone from 01/29/2023 in [place] POPULATION HEALTH DEPARTMENT Telephone from 12/14/2022 in [place] POPULATION HEALTH DEPARTMENT Telephone from 12/06/2022 in [place] POPULATION HEALTH DEPARTMENT  SDOH Interventions        Food Insecurity Interventions Intervention Not Indicated -- -- Intervention Not Indicated Intervention Not Indicated --  Housing Interventions Intervention Not Indicated -- -- -- Intervention Not Indicated --  Transportation Interventions -- Intervention Not Indicated -- -- Intervention Not Indicated --  Utilities Interventions Intervention  Not Indicated -- -- -- Intervention Not Indicated --  Depression Interventions/Treatment  -- -- -- -- --  [referred patient to LCSW for counseling/assistance with coping with life-altering chronic illness] --  Physical Activity Interventions -- -- -- -- -- Other (Comments)  [HHS PT 2Xs per week]  Stress Interventions -- [employer], Provide Counseling  [Pt has successfully completed intake appt for counseling and her first therapy appointment has been set] Other (Comment)  [Referral placed for talk therapy] -- -- --  Health Literacy Interventions -- -- -- -- Intervention Not Indicated --       Care Plan  Allergies  Allergen Reactions   Acetazolamide Other (See Comments)    Confusion   Amiodarone Other (See Comments)    Other Reaction(s): Other (See Comments), Vomiting  Blurred vision   Flecainide Nausea And Vomiting    Hallucination   Metoprolol Shortness Of Breath     Muscle Pain     Trazodone Other (See Comments)    headaches    Medications Reviewed Today     Reviewed by Heidi Dach, RN (Registered Nurse) on 03/21/23 at 1133  Med List Status: <None>   Medication Order Taking? Sig Documenting Provider Last Dose Status Informant  acetaminophen (TYLENOL) 160 MG/5ML liquid [id number] Yes Take 500 mg by mouth every 4 (four) hours as needed for fever or pain. [provider] Taking Active   apixaban (ELIQUIS) 5 MG TABS tablet [id number] Yes Take 1 tablet (5 mg total) by mouth 2 (two) times daily. Laurey Morale, MD Taking Active   bacitracin 500 UNIT/GM ointment [id number] Yes Apply 1 Application topically 2 (two) times daily. [name], MD Taking Active   Furosemide (FUROSCIX) 80 MG/10ML CTKT [id number] No Inject 1 Cartridge into the skin as directed.  Patient  not taking: Reported on 03/21/2023   Delma Freeze, FNP Not Taking Active   linaclotide Karlene Einstein) 72 MCG capsule [id number] Yes Take 1 capsule (72 mcg total) by mouth daily as  needed. Simmons-Robinson, Makiera, MD Taking Active   melatonin 5 MG TABS [id number] Yes Take 5 mg by mouth at bedtime. [provider] Taking Active            Med Note Mliss Fritz, CHERYL A   Fri Jan 05, 2023  3:31 PM) Rarely takes, takes as needed  metoCLOPramide (REGLAN) 5 MG/5ML solution [id number] Yes Place 2.5 mLs (2.5 mg total) into feeding tube 2 (two) times daily. Simmons-Robinson, Tawanna Cooler, MD Taking Active            Med Note (Shaya Reddick A   Wed Mar 21, 2023 11:32 AM) Taking as needed  metolazone (ZAROXOLYN) 2.5 MG tablet [id number] Yes Take 1 tablet (2.5 mg total) by mouth as directed. Take metolazone 2.5 MG every Monday and Friday Bensimhon, Bevelyn Buckles, MD Taking Active   Multiple Vitamins-Minerals (TROPICAL LIQUID NUTRITION) LIQD [id number] Yes Take 15 mLs by G tube once daily [provider] Taking Active   omeprazole (KONVOMEP) 2 mg/mL SUSP oral suspension [id number] Yes Take 10 mLs (20 mg total) by mouth 2 (two) times daily. Simmons-Robinson, Makiera, MD Taking Active   polyethylene glycol powder (GLYCOLAX/MIRALAX) 17 GM/SCOOP powder [id number] No Take 17 g by mouth 2 (two) times daily as needed.  Patient not taking: Reported on 03/21/2023   Jacky Kindle, FNP Not Taking Active   potassium chloride (KLOR-CON) 20 MEQ packet [id number] Yes 1 Packet (20 mEq) daily . Laurey Morale, MD Taking Active   potassium chloride SA (KLOR-CON M) 20 MEQ tablet [id number] No Take 2 tablets (40 mEq total) by mouth 2 (two) times a week. Take potassium 40 MG with Metolazone every Monday and Friday  Patient not taking: Reported on 03/21/2023   Bensimhon, Bevelyn Buckles, MD Not Taking Active   rosuvastatin (CRESTOR) 5 MG tablet [id number] Yes TAKE 1 TABLET BY MOUTH EVERY OTHER DAY Beryle Flock Marzella Schlein, MD Taking Active   spironolactone (ALDACTONE) 25 MG tablet [id number] Yes Take 1 tablet (25 mg total) by mouth daily. Laurey Morale, MD Taking Active   torsemide [facility]) 20 MG tablet [id number]  Yes Take 1 tablet (20 mg total) by mouth daily. Romie Minus, MD Taking Active            Med Note Ardelia Mems, Lieutenant Abarca A   Mon Mar 05, 2023 10:43 AM) Taking 40 mg daily  valACYclovir (VALTREX) 1000 MG tablet [id number] No Place 1 tablet (1,000 mg total) into feeding tube daily. For 5 days  Patient not taking: Reported on 03/21/2023   [name], MD Not Taking Active   venlafaxine [facility]) 75 MG tablet [id number] Yes Take 1 tablet (75 mg total) by mouth 2 (two) times daily with a meal. [name], MD Taking Active             Patient Active Problem List   Diagnosis Date Noted   Esophageal dysphagia 12/05/2022   Acute respiratory failure with hypoxia and hypercarbia (HCC) 12/05/2022   Injury of phrenic nerve 12/05/2022   Failure to thrive in adult 12/05/2022   Tinnitus, right ear 12/05/2022   Pulmonary hypertension (HCC) 12/05/2022   Anemia 02/27/2022   Acquired thrombophilia (HCC) 07/14/2021   Status post radiofrequency ablation for arrhythmia 01/09/2021   On dronedarone therapy 11/16/2020   Renal stones 11/16/2020  Salivary gland calculi 11/16/2020   Ureteral stone 05/15/2020   Moderate episode of recurrent major depressive disorder (HCC) 12/29/2019   Overweight 12/04/2019   Basal cell carcinoma of face 10/03/2019   Typical atrial flutter (HCC) 08/15/2019   Lipoma of left upper extremity 08/04/2019   Primary insomnia 06/23/2019   Red blood cell antibody positive 05/22/2019   Chronic anticoagulation 06/19/2018   Affective disorder, major 08/22/2017   Allergic rhinitis 08/22/2017   Anxiety, generalized 08/22/2017   T2DM (type 2 diabetes mellitus) (HCC) 08/22/2017   Hypertension associated with diabetes (HCC) 08/22/2017   Hyperlipidemia associated with type 2 diabetes mellitus (HCC) 08/22/2017   Mobitz type 2 second degree atrioventricular block 02/05/2017   Paroxysmal A-fib (HCC) 05/25/2015   Recurrent genital HSV (herpes simplex virus) infection  03/13/2014   Chronic constipation 02/17/2014   Reflux gastritis 02/17/2014   Adenomatous polyp of descending colon 02/12/2012    Conditions to be addressed/monitored per PCP order:  CHF and Dysphagia  Care Plan : RN Care Manager Plan of Care  Updates made by Heidi Dach, RN since 03/21/2023 12:00 AM     Problem: Health Management needs related to Dysphagia      Long-Range Goal: Development of Plan of Care to address Health Management needs related to Dysphagia   Start Date: 01/29/2023  Expected End Date: 04/29/2023  Note:   Current Barriers:  Chronic Disease Management support and education needs related to Dysphagia and HF   RNCM Clinical Goal(s):  Patient will verbalize understanding of plan for management of Dysphagia as evidenced by Patient reports take all medications exactly as prescribed and will call provider for medication related questions as evidenced by patient reports attend all scheduled medical appointments: 12/20 with [facility] Clinic, 12/31 with LCSW as evidenced by Provider documentation in EMR continue to work with RN Care Manager to address care management and care coordination needs related to  Dysphagia as evidenced by adherence to CM Team Scheduled appointments through collaboration with RN Care manager, provider, and care team.   Interventions: Evaluation of current treatment plan related to  self management and patient's adherence to plan as established by provider Advised patient to contact [facility] to schedule with new [facility] provider-revisited   Heart Failure Interventions:  (Status:  Goal on track:  Yes.) Long Term Goal Basic overview and discussion of pathophysiology of Heart Failure reviewed Reviewed Heart Failure Action Plan in depth and provided written copy Advised patient to weigh each morning after emptying bladder Discussed importance of daily weight and advised patient to weigh and record daily Reviewed role of diuretics in prevention of fluid  overload and management of heart failure; Discussed the importance of keeping all appointments with provider Assessed social determinant of health barriers  Advised patient to discuss any concerns or questions with provider Reviewed provider note and discussed medication change  Dysphagia  (Status:  Goal on track:  Yes.)  Long Term Goal Evaluation of current treatment plan related to  Dysphagia ,  self-management and patient's adherence to plan as established by provider. Discussed plans with patient for ongoing care management follow up and provided patient with direct contact information for care management team Advised patient to follow up with [facility] Reviewed medications with patient and discussed medications for constipation and fluid retention Reviewed scheduled/upcoming provider appointments including 12/20 with Dr. Gala Romney and 12/31 with LCSW  Assessed social determinant of health barriers Advised patient to contact [facility] [phone] for member benefits-revisted Advised patient to schedule  follow up with [facility] to discuss distention following tube feedings-revisited and encouraged   Patient Goals/Self-Care Activities: Take all medications as prescribed Attend all scheduled provider appointments Call provider office for new concerns or questions   Follow Up Plan:  Telephone follow up appointment with care management team member scheduled for:  04/24/23 at 11:15am      Follow Up:  Patient agrees to Care Plan and Follow-up.  Plan: The Managed Medicaid care management team will reach out to the patient again over the next 30 days.  Date/time of next scheduled RN care management/care coordination outreach:  04/24/23 at 11:15am  Estanislado Emms RN, BSN [place]  [facility] RN Care Coordinator [phone]

## 2023-03-22 NOTE — Progress Notes (Signed)
PCP: [name], MD (last seen 12/24) Primary Cardiologist: Julia Peng, MD (last seen 01/24)   Chief Complaint: shortness of breath  HPI:  Julia Stark is a 62 y.o. female with history of permanent atrial fibrillation, diastolic CHF, and diabetes.Patient has a long history of atrial fibrillation.  She has failed multiple anti-arrhythmic agents (flecainide, amiodarone, dronedarone, and propafenone).  She had an initial ablation in 5/21, then a redo ablation in 8/22.  At the time of 08/22 ablation, foci of atrial tachycardia were noted that were not ablated due to proximity to the AV node. 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  She had ongoing atrial arrhythmia episodes and underwent convergent endocardial/epicardial ablation at [facility] in 9/23 with left atrial appendage clipping.  This was complicated by ileus and then left hemothorax. She developed a recurrent left pleural effusion and ended up with left VATS in 10/23.  Atrial fibrillation recurred, and ultimately, she had AV nodal ablation with [facility] Scientific DDD pacemaker with left bundle lead placed.    Since her surgery, she has had chronic pain radiating from left lower chest to upper back. Primarily located at the site where she had a chest tube at time of hemothorax.  Pain is pleuritic. She was started on colchicine which initially helped but eventually stopped helping and she has stopped the medication.    She has been chronically short of breath since her surgery in 09/23.  Hemodynamic RHC/LHC in 4/24 showed no evidence for ventricular interdependence.  This was not suggestive of constrictive pericarditis.  Echo 04/24 showed EF 50-55%, moderate RV dysfunction, moderate RV enlargement, PASP 46 mmHg, mild-moderate TR; no evidence for constrictive pericarditis.   She has had dysphagia and has been diagnosed by GI with esophageal dysmotility. She tried a medication for esophageal dysmotility but it caused  GI upset so she stopped it.   Admitted 11/04/22 due to 4 to 6 months of progressively worsening dysphagia and weight loss. She feels like food gets stuck in her upper chest/lower neck, and it takes sometimes hours for it to feel like it fully makes its way to her stomach. This sensation takes place about a minute after she swallows. She does not have any coughing with food or drink. G-tube placement on 8/14. VBG was 7.23/99 and improved with adherence to BIPAP. Neurology was consulted for concern for NMJ pathology. EMG with repetitive nerve stimulation was essentially normal, making NMJ pathology unlikely. Diaphragm fluoroscopy on 8/13 demonstrating abnormally decreased diaphragmatic excursion, left worse than right without paradoxical motion of sniff test. Neg acetylcholine receptor antibody. Arranged for her to have ASTRAL VENT with f/u with Dr Danton Sewer in Vent clinic on 12/29/22. EGD with FLIP on 8/5 demonstrating suspicion for abnormal contractility, recommendation was for formal esophageal manometry at outpatient follow-up. She has follow up with GI on 12/28/22. Videofluoroscopy on 8/7 demonstrated esophageal retention, aspiration, and oropharyngeal aspiration with pharyngeal muscle weakness, accordingly with unsafe swallow initially. G-tube was placed on 8/14. Tolerating tube feeds.she will be discharged on TF 140 ml nutren 2 : 6 feeds/day,each feed over 1 hour through pump and 30 ml flushes. MRI /MRA brain done to evaluate dysphagia but showed stenosis and possible small dural AVF.It showed chronic small vessel disease. CTA neck and brain done to follow on this could not confirm or rule it out dural AVF but did not show stenosis. Neurosurgery consulted and did not recommend MRV and they felt risk of MRV since it will be done under GA(  patient is claustrophobic and hypercarbic) outweigh the benefit. This all resulted in a lengthy admission of 26 days.  Was in the ED 02/04/23 due to worsening shortness of breath  and leg swelling for the last week or so. Had to increase her oxygen to 3L (normally wears 2L).  CXR showed moderate right pleural effusion. IV lasix given with improvement of symptoms.    Echo 07/14/22: Near-normal LVEF.  However, there are signs of RV failure.  This does not look like definite constrictive pericarditis by echo. Suspect primarily RV failure.  Plan for cath in future.  Echo 01/30/23: EF 55-60% with moderate LVH, moderately elevated PA pressure of 53.4 mmHg, severe RAE, mild MR, mild/ moderate TR  RHC/LHC 07/28/22:  1. Normal filling pressures.  2. Mild to moderate pulmonary arterial hypertension (PVR not markedly high, only 3.5 WU).  3. Preserved cardiac output.  4. No interventricular interdependence with simultaneous RV/LV pressure tracings, no evidence for constrictive pericarditis.   She presents today for a HF follow-up visit with a chief complaint of moderate shortness of breath with little exertion. Chronic in nature and she doesn't feel like it has improved any since metolazone was started. She has associated fatigue, neck pain making it difficult to raise her head, palpitations, weakness, pedal edema, abdominal distention/ bloating which limits her tube feedings, anxiety and difficulty sleeping along with this. Denies chest pain or weight gain.  Her biggest concern is of her G-tube and that it's becoming irritated at the insertion site and appears to move in and out. She says that her PCP told her to call the surgeon, Dr Lucretia Roers, who put it in. She says that she called his office and was told to go to urgent care or back to the PCP. She voices frustration with all the "run around" that she gets. [name] also trying to see Dr Maisie Fus (EP) but has been told that he has nothing until Aug 2025.Does do pacemaker transmissions and says that she's always told that "everything is fine".  At last visit, metolazone 2.5mg  on M, F with potassium was started. She does think taking the  metolazone twice weekly has helped with her leg swelling.   She had the initial phone call with palliative care but hasn't heard anything back from them yet. Her husband is concerned because of all the discomfort that she is in and just would like her to feel a little more comfortable.   ROS: All systems negative except as listed in HPI, PMH and Problem List.  SH:  Social History   Socioeconomic History   Marital status: Divorced    Spouse name: Not on file   Number of children: 3   Years of education: 12   Highest education level: Some college, no degree  Occupational History    Employer: [employer]    Comment: in special chemistry  Tobacco Use   Smoking status: Former    Types: Cigarettes   Smokeless tobacco: Never  Vaping Use   Vaping status: Never Used  Substance and Sexual Activity   Alcohol use: Not Currently    Alcohol/week: 4.0 standard drinks of alcohol    Types: 2 Glasses of wine, 2 Cans of beer per week   Drug use: No   Sexual activity: Yes    Partners: Male    Birth control/protection: Post-menopausal, Surgical  Other Topics Concern   Not on file  Social History Narrative   Not on file   Social Drivers of Health  Financial Resource Strain: Low Risk  (12/01/2022)   Received from [facility]   Overall Financial Resource Strain (CARDIA)    Difficulty of Paying Living Expenses: Not very hard  Recent Concern: Financial Resource Strain - Medium Risk (12/01/2022)   Overall Financial Resource Strain (CARDIA)    Difficulty of Paying Living Expenses: Somewhat hard  Food Insecurity: No Food Insecurity (03/05/2023)   Hunger Vital Sign    Worried About Running Out of Food in the Last Year: Never true    Ran Out of Food in the Last Year: Never true  Transportation Needs: No Transportation Needs (02/19/2023)   PRAPARE - Administrator, Civil Service (Medical): No    Lack of Transportation (Non-Medical): No  Physical Activity: Inactive  (12/06/2022)   Exercise Vital Sign    Days of Exercise per Week: 0 days    Minutes of Exercise per Session: 0 min  Stress: Stress Concern Present (02/19/2023)   [employer] of Occupational Health - Occupational Stress Questionnaire    Feeling of Stress : To some extent  Social Connections: Socially Isolated (10/02/2022)   Social Connection and Isolation Panel [NHANES]    Frequency of Communication with Friends and Family: More than three times a week    Frequency of Social Gatherings with Friends and Family: Once a week    Attends Religious Services: Never    Database administrator or Organizations: No    Attends Engineer, structural: Not on file    Marital Status: Divorced  Intimate Partner Violence: Not At Risk (12/14/2022)   Humiliation, Afraid, Rape, and Kick questionnaire    Fear of Current or Ex-Partner: No    Emotionally Abused: No    Physically Abused: No    Sexually Abused: No    FH:  Family History  Problem Relation Age of Onset   Diabetes Mother    Hypertension Mother    Skin cancer Mother    Depression Mother    Alzheimer's disease Father    Diabetes Sister    Depression Sister    Diabetes Brother    Colon cancer Maternal Uncle    Hypertension Maternal Grandfather    Hyperlipidemia Maternal Grandfather    Diabetes Paternal Grandmother    Heart disease Paternal Grandmother    Prostate cancer Paternal Grandfather    Ovarian cancer Cousin    Breast cancer Neg Hx    Cervical cancer Neg Hx     Past Medical History:  Diagnosis Date   Actinic keratosis 07/19/2022   L chin - bx proven, needs LN2   Anxiety    Arthritis    bilateral shoulders   Atrial fibrillation (HCC)    Basal cell carcinoma    forehead   Cancer (HCC)    BASAL CELL CARCINOMA OF FACE   CHF (congestive heart failure) (HCC)    Colon polyp    COVID-19 11/26/2020   Depression    Diabetes mellitus without complication (HCC)    Dysrhythmia    A fib   Esophageal dysmotility     Hyperlipidemia    Hypertension    Internal hemorrhoids    Sialoadenitis 12/24/2018    Current Outpatient Medications  Medication Sig Dispense Refill   acetaminophen (TYLENOL) 160 MG/5ML liquid Take 500 mg by mouth every 4 (four) hours as needed for fever or pain.     apixaban (ELIQUIS) 5 MG TABS tablet Take 1 tablet (5 mg total) by mouth 2 (two) times daily. 60 tablet  6   bacitracin 500 UNIT/GM ointment Apply 1 Application topically 2 (two) times daily. 15 g 0   Furosemide (FUROSCIX) 80 MG/10ML CTKT Inject 1 Cartridge into the skin as directed. (Patient not taking: Reported on 03/21/2023)     linaclotide (LINZESS) 72 MCG capsule Take 1 capsule (72 mcg total) by mouth daily as needed. 30 capsule 5   melatonin 5 MG TABS Take 5 mg by mouth at bedtime.     metoCLOPramide (REGLAN) 5 MG/5ML solution Place 2.5 mLs (2.5 mg total) into feeding tube 2 (two) times daily. 120 mL 5   metolazone (ZAROXOLYN) 2.5 MG tablet Take 1 tablet (2.5 mg total) by mouth as directed. Take metolazone 2.5 MG every Monday and Friday 24 tablet 3   Multiple Vitamins-Minerals (TROPICAL LIQUID NUTRITION) LIQD Take 15 mLs by G tube once daily     omeprazole (KONVOMEP) 2 mg/mL SUSP oral suspension Take 10 mLs (20 mg total) by mouth 2 (two) times daily. 900 mL 5   polyethylene glycol powder (GLYCOLAX/MIRALAX) 17 GM/SCOOP powder Take 17 g by mouth 2 (two) times daily as needed. (Patient not taking: Reported on 03/21/2023) 850 g 1   potassium chloride (KLOR-CON) 20 MEQ packet 1 Packet (20 mEq) daily . 30 each 6   potassium chloride SA (KLOR-CON M) 20 MEQ tablet Take 2 tablets (40 mEq total) by mouth 2 (two) times a week. Take potassium 40 MG with Metolazone every Monday and Friday (Patient not taking: Reported on 03/21/2023) 32 tablet 3   rosuvastatin (CRESTOR) 5 MG tablet TAKE 1 TABLET BY MOUTH EVERY OTHER DAY 45 tablet 1   spironolactone (ALDACTONE) 25 MG tablet Take 1 tablet (25 mg total) by mouth daily. 90 tablet 3   torsemide  (DEMADEX) 20 MG tablet Take 1 tablet (20 mg total) by mouth daily. 90 tablet 3   valACYclovir (VALTREX) 1000 MG tablet Place 1 tablet (1,000 mg total) into feeding tube daily. For 5 days (Patient not taking: Reported on 03/21/2023) 5 tablet 3   venlafaxine (EFFEXOR) 75 MG tablet Take 1 tablet (75 mg total) by mouth 2 (two) times daily with a meal. 180 tablet 1   No current facility-administered medications for this visit.   Vitals:   03/23/23 1405  BP: (!) 127/48  Pulse: 70  SpO2: 100%  Weight: 136 lb (61.7 kg)   Wt Readings from Last 3 Encounters:  03/23/23 136 lb (61.7 kg)  03/12/23 142 lb (64.4 kg)  03/08/23 144 lb (65.3 kg)   Lab Results  Component Value Date   CREATININE 0.58 03/08/2023   CREATININE 0.44 02/13/2023   CREATININE 0.42 (L) 02/04/2023    PHYSICAL EXAM:  General:  Thin appearing No resp difficulty HEENT: normal Neck: supple. JVP flat. No lymphadenopathy or thryomegaly appreciated. Cor: PMI normal. Regular rate & rhythm. No rubs, gallops or murmurs. Lungs: clear  Abdomen: soft, nontender,  no distention Gtube present with irritation at insertion site. No hepatosplenomegaly. No bruits or masses.  Extremities: no cyanosis, clubbing, rash, trace pitting edema bilaterally  Neuro: alert & oriented x3, cranial nerves grossly intact. Sitting with head down due to neck pain. Affect pleasant but tearful & anxious.   ECG: not done   ASSESSMENT & PLAN:  1: NICM with preserved ejection fraction- - NYHA class III - euvolemic - weighing daily; reminded to call for an overnight weight gain of > 2 pounds or weekly weight gain of > 5 pounds - weight down 8 pounds from last visit here 2 weeks  ago - echo 01/12/22: EF of 50% along with severe LAE/ RAE and moderate TR.  - Echo 07/14/22: Near-normal LVEF.  However, there are signs of RV failure.  This does not look like definite constrictive pericarditis by echo. Suspect primarily RV failure.   - Echo 01/30/23: EF 55-60%  with moderate LVH, moderately elevated PA pressure of 53.4 mmHg, severe RAE, mild MR, mild/ moderate TR - RHC/LHC 07/28/22:    1. Normal filling pressures.    2. Mild to moderate pulmonary arterial hypertension (PVR not markedly high, only 3.5 WU).    3. Preserved cardiac output.    4. No interventricular interdependence with simultaneous RV/LV pressure tracings, no evidence for constrictive pericarditis.  - cMRI 05/20/19: The left ventricle is normal in cavity size and wall thickness. Global systolic function is normal. The LV ejection fraction is 61%. There are no regional wall motion abnormalities. The right ventricle is normal in cavity size, wall thickness, and   systolic function. There is mild bilateral atrial dilatation. The aortic valve is trileaflet in morphology. There is no significant aortic valve stenosis or  regurgitation. There is no significant valvular disease. Delayed enhancement imaging demonstrates no evidence of myocardial infarction, scar or infiltrative disease.  - So far, imaging has not been consistent with constrictive pericarditis.  Situation looks more like RV failure.   - she cancelled cMRI due to her claustrophobia - not adding salt and has been reading food labels for sodium content - tries to be active but difficult with her fatigue/ SOB - continue torsemide 40mg  daily - continue spironolactone 25mg  daily - continue metolazone 2.5mg  on M, F w/ potassium each dose - BMET today - continue compression socks daily with removal at bedtime - elevate her legs when she can - history of UTI's so not a candidate for SGLT2 - BNP 02/04/23 was 271.4  2: HTN- - BP 127/48 - saw PCP Julia Stark) 12/24  - BMP 03/08/23 showed sodium 136, potassium 4.2, creatinine 0.58 & GFR >60 - BMET today  3: PAF- - saw cardiothoracic surgeon ([name]) 03/24   - multiple previous ablations/DCCV  - s/p convergent procedure 12/14/21  - s/p DC PPM with LB area lead and AV node  ablation 05/17/22 - continue eliquis 5mg  BID - saw cardiology [name]) 01/24  4: Pulmonary HTN- - Mild-moderate with PVR 3.5 WU on 4/24 RHC - She has OSA which may contribute. - Has significant RV failure.   5: Esophageal dysmotility- - G- tube placed 11/15/22 - TF 140 ml nutren 2 : 6 feeds/day,each feed over 1 hour through pump and 30 ml flushes although she's only gotten 1 feed in this morning because of how bloated she feels - saw GI 09/24 - albumin 12/05/22 was 4.0 - has redness/ irritation at insertion site where the tube is moving in an out; explained that if the GI provider office that placed the tube was unable to see her, she always had the option of going to the ER  6: Recurrent pleural effusions- - wearing oxygen at 2L around the clock - Recurrent pleural effusions status post VATS/decortication (01/2022)  - saw Dr Danton Sewer in Vent clinic 09/24 - patient & husband tearful in the office - patient says that palliative care has called her but hasn't come out to see her yet   Return in 2 months, sooner if needed.

## 2023-03-23 ENCOUNTER — Encounter: Payer: Self-pay | Admitting: Family

## 2023-03-23 ENCOUNTER — Ambulatory Visit: Payer: Medicaid Other | Attending: Family | Admitting: Family

## 2023-03-23 VITALS — BP 127/48 | HR 70 | Wt 136.0 lb

## 2023-03-23 DIAGNOSIS — Z9981 Dependence on supplemental oxygen: Secondary | ICD-10-CM | POA: Diagnosis not present

## 2023-03-23 DIAGNOSIS — I4821 Permanent atrial fibrillation: Secondary | ICD-10-CM | POA: Insufficient documentation

## 2023-03-23 DIAGNOSIS — Z87440 Personal history of urinary (tract) infections: Secondary | ICD-10-CM | POA: Diagnosis not present

## 2023-03-23 DIAGNOSIS — G4733 Obstructive sleep apnea (adult) (pediatric): Secondary | ICD-10-CM | POA: Insufficient documentation

## 2023-03-23 DIAGNOSIS — R232 Flushing: Secondary | ICD-10-CM | POA: Diagnosis not present

## 2023-03-23 DIAGNOSIS — Z7901 Long term (current) use of anticoagulants: Secondary | ICD-10-CM | POA: Diagnosis not present

## 2023-03-23 DIAGNOSIS — R1319 Other dysphagia: Secondary | ICD-10-CM | POA: Diagnosis not present

## 2023-03-23 DIAGNOSIS — J90 Pleural effusion, not elsewhere classified: Secondary | ICD-10-CM

## 2023-03-23 DIAGNOSIS — Z515 Encounter for palliative care: Secondary | ICD-10-CM | POA: Diagnosis not present

## 2023-03-23 DIAGNOSIS — Z79899 Other long term (current) drug therapy: Secondary | ICD-10-CM | POA: Insufficient documentation

## 2023-03-23 DIAGNOSIS — I4819 Other persistent atrial fibrillation: Secondary | ICD-10-CM

## 2023-03-23 DIAGNOSIS — I428 Other cardiomyopathies: Secondary | ICD-10-CM | POA: Insufficient documentation

## 2023-03-23 DIAGNOSIS — F40240 Claustrophobia: Secondary | ICD-10-CM | POA: Insufficient documentation

## 2023-03-23 DIAGNOSIS — I110 Hypertensive heart disease with heart failure: Secondary | ICD-10-CM | POA: Insufficient documentation

## 2023-03-23 DIAGNOSIS — I272 Pulmonary hypertension, unspecified: Secondary | ICD-10-CM | POA: Diagnosis not present

## 2023-03-23 DIAGNOSIS — Z9889 Other specified postprocedural states: Secondary | ICD-10-CM | POA: Diagnosis not present

## 2023-03-23 DIAGNOSIS — I5032 Chronic diastolic (congestive) heart failure: Secondary | ICD-10-CM | POA: Diagnosis not present

## 2023-03-23 DIAGNOSIS — I2721 Secondary pulmonary arterial hypertension: Secondary | ICD-10-CM | POA: Insufficient documentation

## 2023-03-23 DIAGNOSIS — I10 Essential (primary) hypertension: Secondary | ICD-10-CM | POA: Diagnosis not present

## 2023-03-24 LAB — BASIC METABOLIC PANEL
BUN/Creatinine Ratio: 91 — ABNORMAL HIGH (ref 12–28)
BUN: 42 mg/dL — ABNORMAL HIGH (ref 8–27)
CO2: >50 mmol/L (ref 20–29)
Calcium: 10.2 mg/dL (ref 8.7–10.3)
Chloride: 77 mmol/L — ABNORMAL LOW (ref 96–106)
Creatinine, Ser: 0.46 mg/dL — ABNORMAL LOW (ref 0.57–1.00)
Glucose: 135 mg/dL — ABNORMAL HIGH (ref 70–99)
Potassium: 3.2 mmol/L — ABNORMAL LOW (ref 3.5–5.2)
Sodium: 143 mmol/L (ref 134–144)
eGFR: 108 mL/min/{1.73_m2} (ref >59)

## 2023-03-26 ENCOUNTER — Telehealth: Payer: Self-pay

## 2023-03-26 DIAGNOSIS — I5032 Chronic diastolic (congestive) heart failure: Secondary | ICD-10-CM

## 2023-03-26 NOTE — Telephone Encounter (Signed)
BMET Order placed, per [name], FNP

## 2023-03-26 NOTE — Telephone Encounter (Signed)
Results and provider note reviewed with patient. Pt verbalized understanding and agreement, she states she will decrease metolazone to once weekly, unless really needed for weight gain/swelling. She will return for lab work week on 1/6. BMET order placed.  Pt also informed that our office has reached out to palliative care for an update, per patient and provider request, and are waiting to hear back from them. Pt verbalized understanding.

## 2023-03-26 NOTE — Telephone Encounter (Signed)
-----   Message from Delma Freeze sent at 03/26/2023  8:29 AM EST ----- Recheck BMET in 2 weeks

## 2023-03-29 ENCOUNTER — Encounter: Payer: Self-pay | Admitting: Family

## 2023-03-30 ENCOUNTER — Telehealth: Payer: Self-pay

## 2023-03-30 NOTE — Telephone Encounter (Signed)
I spoke to Sanders and to Star and relayed authorization from Dr. Payton Mccallum for a hospice evaluation.

## 2023-03-30 NOTE — Telephone Encounter (Unsigned)
Copied from CRM [id number]. Topic: General - Inquiry >> Mar 30, 2023  2:51 PM Clide Dales wrote: Baxter Hire with [facility] called to get a hospice evaluation order for patient. [phone]

## 2023-03-31 DIAGNOSIS — K224 Dyskinesia of esophagus: Secondary | ICD-10-CM | POA: Diagnosis not present

## 2023-03-31 DIAGNOSIS — I4821 Permanent atrial fibrillation: Secondary | ICD-10-CM | POA: Diagnosis not present

## 2023-03-31 DIAGNOSIS — F349 Persistent mood [affective] disorder, unspecified: Secondary | ICD-10-CM | POA: Diagnosis not present

## 2023-03-31 DIAGNOSIS — F331 Major depressive disorder, recurrent, moderate: Secondary | ICD-10-CM | POA: Diagnosis not present

## 2023-03-31 DIAGNOSIS — I5032 Chronic diastolic (congestive) heart failure: Secondary | ICD-10-CM | POA: Diagnosis not present

## 2023-03-31 DIAGNOSIS — F411 Generalized anxiety disorder: Secondary | ICD-10-CM | POA: Diagnosis not present

## 2023-03-31 DIAGNOSIS — K581 Irritable bowel syndrome with constipation: Secondary | ICD-10-CM | POA: Diagnosis not present

## 2023-03-31 DIAGNOSIS — I272 Pulmonary hypertension, unspecified: Secondary | ICD-10-CM | POA: Diagnosis not present

## 2023-03-31 DIAGNOSIS — E46 Unspecified protein-calorie malnutrition: Secondary | ICD-10-CM | POA: Diagnosis not present

## 2023-03-31 DIAGNOSIS — E876 Hypokalemia: Secondary | ICD-10-CM | POA: Diagnosis not present

## 2023-03-31 DIAGNOSIS — J90 Pleural effusion, not elsewhere classified: Secondary | ICD-10-CM | POA: Diagnosis not present

## 2023-04-01 DIAGNOSIS — K581 Irritable bowel syndrome with constipation: Secondary | ICD-10-CM | POA: Diagnosis not present

## 2023-04-01 DIAGNOSIS — K224 Dyskinesia of esophagus: Secondary | ICD-10-CM | POA: Diagnosis not present

## 2023-04-01 DIAGNOSIS — I272 Pulmonary hypertension, unspecified: Secondary | ICD-10-CM | POA: Diagnosis not present

## 2023-04-01 DIAGNOSIS — I4821 Permanent atrial fibrillation: Secondary | ICD-10-CM | POA: Diagnosis not present

## 2023-04-01 DIAGNOSIS — F331 Major depressive disorder, recurrent, moderate: Secondary | ICD-10-CM | POA: Diagnosis not present

## 2023-04-01 DIAGNOSIS — E876 Hypokalemia: Secondary | ICD-10-CM | POA: Diagnosis not present

## 2023-04-01 DIAGNOSIS — I5032 Chronic diastolic (congestive) heart failure: Secondary | ICD-10-CM | POA: Diagnosis not present

## 2023-04-01 DIAGNOSIS — F411 Generalized anxiety disorder: Secondary | ICD-10-CM | POA: Diagnosis not present

## 2023-04-01 DIAGNOSIS — J90 Pleural effusion, not elsewhere classified: Secondary | ICD-10-CM | POA: Diagnosis not present

## 2023-04-01 DIAGNOSIS — E46 Unspecified protein-calorie malnutrition: Secondary | ICD-10-CM | POA: Diagnosis not present

## 2023-04-01 DIAGNOSIS — F349 Persistent mood [affective] disorder, unspecified: Secondary | ICD-10-CM | POA: Diagnosis not present

## 2023-04-02 DIAGNOSIS — E876 Hypokalemia: Secondary | ICD-10-CM | POA: Diagnosis not present

## 2023-04-02 DIAGNOSIS — I4821 Permanent atrial fibrillation: Secondary | ICD-10-CM | POA: Diagnosis not present

## 2023-04-02 DIAGNOSIS — G1229 Other motor neuron disease: Secondary | ICD-10-CM | POA: Diagnosis not present

## 2023-04-02 DIAGNOSIS — J984 Other disorders of lung: Secondary | ICD-10-CM | POA: Diagnosis not present

## 2023-04-02 DIAGNOSIS — F349 Persistent mood [affective] disorder, unspecified: Secondary | ICD-10-CM | POA: Diagnosis not present

## 2023-04-02 DIAGNOSIS — I5032 Chronic diastolic (congestive) heart failure: Secondary | ICD-10-CM | POA: Diagnosis not present

## 2023-04-02 DIAGNOSIS — J90 Pleural effusion, not elsewhere classified: Secondary | ICD-10-CM | POA: Diagnosis not present

## 2023-04-02 DIAGNOSIS — E46 Unspecified protein-calorie malnutrition: Secondary | ICD-10-CM | POA: Diagnosis not present

## 2023-04-02 DIAGNOSIS — K581 Irritable bowel syndrome with constipation: Secondary | ICD-10-CM | POA: Diagnosis not present

## 2023-04-02 DIAGNOSIS — R1314 Dysphagia, pharyngoesophageal phase: Secondary | ICD-10-CM | POA: Diagnosis not present

## 2023-04-02 DIAGNOSIS — I272 Pulmonary hypertension, unspecified: Secondary | ICD-10-CM | POA: Diagnosis not present

## 2023-04-02 DIAGNOSIS — K224 Dyskinesia of esophagus: Secondary | ICD-10-CM | POA: Diagnosis not present

## 2023-04-02 DIAGNOSIS — F411 Generalized anxiety disorder: Secondary | ICD-10-CM | POA: Diagnosis not present

## 2023-04-02 DIAGNOSIS — F331 Major depressive disorder, recurrent, moderate: Secondary | ICD-10-CM | POA: Diagnosis not present

## 2023-04-03 ENCOUNTER — Other Ambulatory Visit: Payer: Self-pay | Admitting: Licensed Clinical Social Worker

## 2023-04-03 DIAGNOSIS — I4821 Permanent atrial fibrillation: Secondary | ICD-10-CM | POA: Diagnosis not present

## 2023-04-03 DIAGNOSIS — F331 Major depressive disorder, recurrent, moderate: Secondary | ICD-10-CM | POA: Diagnosis not present

## 2023-04-03 DIAGNOSIS — I272 Pulmonary hypertension, unspecified: Secondary | ICD-10-CM | POA: Diagnosis not present

## 2023-04-03 DIAGNOSIS — I5032 Chronic diastolic (congestive) heart failure: Secondary | ICD-10-CM | POA: Diagnosis not present

## 2023-04-03 DIAGNOSIS — E876 Hypokalemia: Secondary | ICD-10-CM | POA: Diagnosis not present

## 2023-04-03 DIAGNOSIS — K224 Dyskinesia of esophagus: Secondary | ICD-10-CM | POA: Diagnosis not present

## 2023-04-03 DIAGNOSIS — K581 Irritable bowel syndrome with constipation: Secondary | ICD-10-CM | POA: Diagnosis not present

## 2023-04-03 DIAGNOSIS — F411 Generalized anxiety disorder: Secondary | ICD-10-CM | POA: Diagnosis not present

## 2023-04-03 DIAGNOSIS — F349 Persistent mood [affective] disorder, unspecified: Secondary | ICD-10-CM | POA: Diagnosis not present

## 2023-04-03 DIAGNOSIS — E46 Unspecified protein-calorie malnutrition: Secondary | ICD-10-CM | POA: Diagnosis not present

## 2023-04-03 DIAGNOSIS — J90 Pleural effusion, not elsewhere classified: Secondary | ICD-10-CM | POA: Diagnosis not present

## 2023-04-03 NOTE — Patient Instructions (Signed)
 Romero VEAR Seats ,   The [facility] Managed Care Team is available to provide assistance to you with your healthcare needs at no cost and as a benefit of your [facility] Health plan. I'm sorry I was unable to reach you today for our scheduled appointment. Our care guide will call you to reschedule our telephone appointment. Please call me at the number below. I am available to be of assistance to you regarding your healthcare needs. .   Thank you,   Lyle Rung, BSW, MSW, LCSW Licensed Clinical Social Worker [employer]   [email or web address] Direct Dial: [phone]

## 2023-04-03 NOTE — Patient Outreach (Signed)
  Medicaid Managed Care   Unsuccessful Attempt Note   04/03/2023 Name: Julia Stark MRN: [id number] DOB: 04-26-1960  Referred by: Myrla Jon HERO, MD Reason for referral : No chief complaint on file.   An unsuccessful telephone outreach was attempted today. The patient was referred to the case management team for assistance with care management and care coordination.    Follow Up Plan: A HIPAA compliant phone message was left for the patient providing contact information and requesting a return call. Email sent as well.  Lyle Rung, BSW, MSW, LCSW Licensed Clinical Social Worker [employer]   [email or web address] Direct Dial: [phone]

## 2023-04-09 ENCOUNTER — Encounter: Payer: Medicaid Other | Admitting: Cardiology

## 2023-04-17 ENCOUNTER — Emergency Department
Admission: EM | Admit: 2023-04-17 | Discharge: 2023-04-17 | Disposition: A | Discharge status: Home / Self Care | Payer: Medicaid Other | Attending: Emergency Medicine | Admitting: Emergency Medicine

## 2023-04-17 ENCOUNTER — Other Ambulatory Visit: Payer: Self-pay

## 2023-04-17 DIAGNOSIS — R040 Epistaxis: Secondary | ICD-10-CM | POA: Diagnosis not present

## 2023-04-17 DIAGNOSIS — I5032 Chronic diastolic (congestive) heart failure: Secondary | ICD-10-CM | POA: Diagnosis not present

## 2023-04-17 DIAGNOSIS — Z7901 Long term (current) use of anticoagulants: Secondary | ICD-10-CM | POA: Diagnosis not present

## 2023-04-17 DIAGNOSIS — E119 Type 2 diabetes mellitus without complications: Secondary | ICD-10-CM | POA: Diagnosis not present

## 2023-04-17 LAB — CBC WITH DIFFERENTIAL/PLATELET
Abs Immature Granulocytes: 0.01 10*3/uL (ref 0.00–0.07)
Basophils Absolute: 0 10*3/uL (ref 0.0–0.1)
Basophils Relative: 1 %
Eosinophils Absolute: 0 10*3/uL (ref 0.0–0.5)
Eosinophils Relative: 1 %
HCT: 28.6 % — ABNORMAL LOW (ref 36.0–46.0)
Hemoglobin: 7.8 g/dL — ABNORMAL LOW (ref 12.0–15.0)
Immature Granulocytes: 0 %
Lymphocytes Relative: 22 %
Lymphs Abs: 1.2 10*3/uL (ref 0.7–4.0)
MCH: 20.9 pg — ABNORMAL LOW (ref 26.0–34.0)
MCHC: 27.3 g/dL — ABNORMAL LOW (ref 30.0–36.0)
MCV: 76.5 fL — ABNORMAL LOW (ref 80.0–100.0)
Monocytes Absolute: 0.9 10*3/uL (ref 0.1–1.0)
Monocytes Relative: 17 %
Neutro Abs: 3.3 10*3/uL (ref 1.7–7.7)
Neutrophils Relative %: 59 %
Platelets: 229 10*3/uL (ref 150–400)
RBC: 3.74 MIL/uL — ABNORMAL LOW (ref 3.87–5.11)
RDW: 17.2 % — ABNORMAL HIGH (ref 11.5–15.5)
WBC: 5.5 10*3/uL (ref 4.0–10.5)
nRBC: 0 % (ref 0.0–0.2)

## 2023-04-17 LAB — BASIC METABOLIC PANEL
BUN: 33 mg/dL — ABNORMAL HIGH (ref 8–23)
CO2: >45 mmol/L — ABNORMAL HIGH (ref 22–32)
Calcium: 9.5 mg/dL (ref 8.9–10.3)
Chloride: 73 mmol/L — ABNORMAL LOW (ref 98–111)
Creatinine, Ser: 0.47 mg/dL (ref 0.44–1.00)
GFR, Estimated: >60 mL/min (ref >60)
Glucose, Bld: 129 mg/dL — ABNORMAL HIGH (ref 70–99)
Potassium: 3.4 mmol/L — ABNORMAL LOW (ref 3.5–5.1)
Sodium: 135 mmol/L (ref 135–145)

## 2023-04-17 MED ORDER — OXYMETAZOLINE HCL 0.05 % NA SOLN
1.0000 | Freq: Once | NASAL | Status: AC | PRN
  Administered 2023-04-17: 1 via NASAL
  Filled 2023-04-17: qty 30

## 2023-04-17 NOTE — ED Triage Notes (Signed)
 Pt has had a nose bleed for the past 2 hours, pt takes eliquis. Pt denies headache.

## 2023-04-17 NOTE — Discharge Instructions (Signed)
 You may use your BiPAP mask.  Please do not blow your nose for the next 3 days, if you need to cough or sneeze please hold your nose tightly to prevent dislodgment of any clots in your nose.  If the bleeding recurs, please come back to the emergency department.  Please return to the emergency department if you have any additional concerns.

## 2023-04-17 NOTE — ED Notes (Signed)
 Gauze removed from rt nare;bleeding noted; afrin admin as ordered and nose clamp applied

## 2023-04-17 NOTE — Progress Notes (Signed)
 [facility] Liaison note:   This patient is a current [facility] patient. [facility] will continue to follow for discharge disposition.    Please call for any Hospice  related questions or concerns.   [facility] Liaison [phone]

## 2023-04-17 NOTE — ED Notes (Signed)
 See triage note  Presents with nose bleed  Family states she developed a nose bleed about 10 last night  Had applied pressure while in lobby  No bleeding noted at present

## 2023-04-17 NOTE — ED Notes (Signed)
 Clamp off, no bleeding noted at this time

## 2023-04-17 NOTE — ED Provider Notes (Signed)
 Julia Stark Provider Note    Event Date/Time   First MD Initiated Contact with Patient 04/17/23 [phone]     (approximate)   History   Epistaxis   HPI  Julia Stark is a 63 y.o. female history of atrial fibrillation, chronic diastolic heart failure, chronic CO2 retainer, diabetes, on hospice, presenting with bilateral anterior epistaxis.  Started last night.  Was given Afrin in the emergency department with improvement and resolution of her epistaxis.  Per son, she is on BiPAP and had questions about whether or not she could use the BiPAP due to the blood clots that formed in her nose.  No trauma.  [name] on Eliquis .  On independent chart review, she was seen by family medicine office visit in December, and she had had a prolonged stay last year where she failed multiple antiarrhythmic agents for atrial fibrillation, underwent ablation that was complicated by ileus and left hemothorax.  Patient denies any acute symptoms at this time other than the nosebleed.     Physical Exam   Triage Vital Signs: ED Triage Vitals  Encounter Vitals Group     BP 04/17/23 0139 (!) 129/42     Systolic BP Percentile --      Diastolic BP Percentile --      Pulse Rate 04/17/23 0139 78     Resp 04/17/23 0139 18     Temp 04/17/23 0139 97.8 F (36.6 C)     Temp Source 04/17/23 0139 Oral     SpO2 04/17/23 0139 98 %     Weight 04/17/23 0139 142 lb (64.4 kg)     Height 04/17/23 0139 5' 8 (1.727 m)     Head Circumference --      Peak Flow --      Pain Score 04/17/23 0138 0     Pain Loc --      Pain Education --      Exclude from Growth Chart --     Most recent vital signs: Vitals:   04/17/23 0318 04/17/23 0838  BP: (!) 116/51 (!) 112/50  Pulse: 80 78  Resp: 18 18  Temp: 98 F (36.7 C) 98 F (36.7 C)  SpO2: 100% 100%    General: Awake, no distress.  CV:  Good peripheral perfusion.  Resp:  Normal effort.  Abd:  No distention.  Other:  Dried blood around  bilateral nares, oropharynx is clear, no active bleeding at this time.   ED Results / Procedures / Treatments   Labs (all labs ordered are listed, but only abnormal results are displayed) Labs Reviewed  CBC WITH DIFFERENTIAL/PLATELET - Abnormal; Notable for the following components:      Result Value   RBC 3.74 (*)    Hemoglobin 7.8 (*)    HCT 28.6 (*)    MCV 76.5 (*)    MCH 20.9 (*)    MCHC 27.3 (*)    RDW 17.2 (*)    All other components within normal limits  BASIC METABOLIC PANEL - Abnormal; Notable for the following components:   Potassium 3.4 (*)    Chloride 73 (*)    CO2 >45 (*)    Glucose, Bld 129 (*)    BUN 33 (*)    All other components within normal limits      PROCEDURES:  Critical Care performed: No  Procedures   MEDICATIONS ORDERED IN ED: Medications  oxymetazoline  (AFRIN) 0.05 % nasal spray 1 spray (1 spray Each Nare Given  04/17/23 0325)     IMPRESSION / MDM / ASSESSMENT AND PLAN / ED COURSE  I reviewed the triage vital signs and the nursing notes.                              Differential diagnosis includes, but is not limited to, anterior epistaxis, posterior epistaxis, anemia, medication side effect.  Patient's presentation is most consistent with acute complicated illness / injury requiring diagnostic workup.  Patient with history of chronic A-fib on Eliquis  presenting with anterior bilateral nosebleeds that resolved after she was given Afrin in the waiting room.  No evidence of posterior bleed at this time.  Her main concern is if she can use her BiPAP machine since she can feel the clots in her nose.  Consulted ENT via phone who states that since she is using a facemask it will not be an issue for her to use her BiPAP while she is clots in her nose.  Independent review and interpretation of labs, her CO2 is elevated, this appears chronic compared to prior, her hemoglobin is 7.8, it was 8 on prior labs.  Given that she has stopped bleeding while in  the waiting room, is at her baseline, no indication for further labs at this time.  Offered ENT follow-up but patient and son would prefer to follow-up with hospice.  Strict return precautions given.  Considered but no indication for further admission or workup at this time, she safe for outpatient management.  Will discharge with strict precautions.     FINAL CLINICAL IMPRESSION(S) / ED DIAGNOSES   Final diagnoses:  Epistaxis     Rx / DC Orders   ED Discharge Orders     None        Note:  This document was prepared using Dragon voice recognition software and may include unintentional dictation errors.    [name], MD 04/17/23 [phone]

## 2023-04-20 NOTE — Patient Outreach (Signed)
Care Coordination  04/20/2023  KAYTLINN OPSAHL 05/31/1960 [id number]  Per chart, patient is now enrolled with Hospice. [facility] team will close case at this time.  Dickie La, BSW, MSW, LCSW Licensed Clinical Social Worker [employer]   [facility] [email or web address] Direct Dial: [phone]

## 2023-04-24 ENCOUNTER — Other Ambulatory Visit: Payer: Self-pay | Admitting: *Deleted

## 2023-04-24 NOTE — Patient Outreach (Signed)
   Care Management/Care Coordination  RN Case Manager Case Closure Note  04/24/2023 Name: Julia Stark MRN: [id number] DOB: Jul 01, 1960  Julia Stark is a 63 y.o. year old female who is a primary care patient of Bacigalupo, Marzella Schlein, MD. The care management/care coordination team was consulted for assistance with chronic disease management and/or care coordination needs.   Care Plan : RN Care Manager Plan of Care  Updates made by Heidi Dach, RN since 04/24/2023 12:00 AM  Completed 04/24/2023   Problem: Health Management needs related to Dysphagia Resolved 04/24/2023     Long-Range Goal: Development of Plan of Care to address Health Management needs related to Dysphagia Completed 04/24/2023  Start Date: 01/29/2023  Expected End Date: 04/29/2023  Note:   Current Barriers:  Chronic Disease Management support and education needs related to Dysphagia and HF  Patient enrolled in Hospice  RNCM Clinical Goal(s):  Patient will verbalize understanding of plan for management of Dysphagia as evidenced by Patient reports take all medications exactly as prescribed and will call provider for medication related questions as evidenced by patient reports attend all scheduled medical appointments: 12/20 with [facility] Clinic, 12/31 with LCSW as evidenced by Provider documentation in EMR continue to work with RN Care Manager to address care management and care coordination needs related to  Dysphagia as evidenced by adherence to CM Team Scheduled appointments through collaboration with RN Care manager, provider, and care team.   Interventions: Evaluation of current treatment plan related to  self management and patient's adherence to plan as established by provider Advised patient to contact [facility] to schedule with new [facility] provider-revisited   Heart Failure Interventions:  (Status:  Goal Met.) Long Term Goal Basic overview and discussion of pathophysiology of Heart Failure reviewed Reviewed Heart  Failure Action Plan in depth and provided written copy Advised patient to weigh each morning after emptying bladder Discussed importance of daily weight and advised patient to weigh and record daily Reviewed role of diuretics in prevention of fluid overload and management of heart failure; Discussed the importance of keeping all appointments with provider Assessed social determinant of health barriers  Advised patient to discuss any concerns or questions with provider Reviewed provider note and discussed medication change  Dysphagia  (Status:   Goal Unmet )  Long Term Goal-Hospice providing care Evaluation of current treatment plan related to  Dysphagia ,  self-management and patient's adherence to plan as established by provider. Discussed plans with patient for ongoing care management follow up and provided patient with direct contact information for care management team Advised patient to follow up with [facility] Reviewed medications with patient and discussed medications for constipation and fluid retention Reviewed scheduled/upcoming provider appointments including 12/20 with Dr. Gala Romney and 12/31 with LCSW  Assessed social determinant of health barriers Advised patient to contact [facility] [phone] for member benefits-revisted Advised patient to schedule follow up with [facility] to discuss distention following tube feedings-revisited and encouraged   Patient Goals/Self-Care Activities: Take all medications as prescribed Attend all scheduled provider appointments Call provider office for new concerns or questions   Follow Up Plan:  No further follow up required:        Plan:  Hospice is now involved with patient care. RNCM performing Case Closure.  Estanislado Emms RN, BSN [place]  [facility] RN Care Manager [phone]

## 2023-05-05 DEATH — deceased

## 2023-06-04 ENCOUNTER — Telehealth: Payer: Self-pay | Admitting: Cardiology

## 2023-06-04 NOTE — Telephone Encounter (Signed)
 Unable to confirm appt phone says can't be completed as dialed

## 2023-06-05 ENCOUNTER — Encounter: Payer: Medicaid Other | Admitting: Cardiology

## 2023-07-25 ENCOUNTER — Ambulatory Visit: Payer: Medicaid Other | Admitting: Dermatology
